# Patient Record
Sex: Male | Born: 1965 | Race: White | Hispanic: No | Smoking: Current every day smoker
Health system: Southern US, Community
[De-identification: ages and names within clinical notes are randomized; demographics above are authoritative.]

## PROBLEM LIST (undated history)

## (undated) DIAGNOSIS — E785 Hyperlipidemia, unspecified: Secondary | ICD-10-CM

## (undated) DIAGNOSIS — K219 Gastro-esophageal reflux disease without esophagitis: Secondary | ICD-10-CM

## (undated) DIAGNOSIS — I10 Essential (primary) hypertension: Secondary | ICD-10-CM

## (undated) DIAGNOSIS — I639 Cerebral infarction, unspecified: Secondary | ICD-10-CM

## (undated) HISTORY — PX: OTHER SURGICAL HISTORY: SHX169

## (undated) HISTORY — DX: Hyperlipidemia, unspecified: E78.5

## (undated) HISTORY — DX: Essential (primary) hypertension: I10

## (undated) HISTORY — PX: INGUINAL HERNIA REPAIR: SUR1180

## (undated) HISTORY — PX: CLAVICLE SURGERY: SHX598

## (undated) HISTORY — DX: Cerebral infarction, unspecified: I63.9

## (undated) HISTORY — DX: Gastro-esophageal reflux disease without esophagitis: K21.9

---

## 2005-11-07 ENCOUNTER — Emergency Department (HOSPITAL_COMMUNITY): Admission: EM | Admit: 2005-11-07 | Discharge: 2005-11-07 | Payer: Self-pay | Admitting: Emergency Medicine

## 2005-11-09 ENCOUNTER — Emergency Department (HOSPITAL_COMMUNITY): Admission: EM | Admit: 2005-11-09 | Discharge: 2005-11-09 | Payer: Self-pay | Admitting: Family Medicine

## 2006-05-08 DIAGNOSIS — K279 Peptic ulcer, site unspecified, unspecified as acute or chronic, without hemorrhage or perforation: Secondary | ICD-10-CM

## 2006-05-08 HISTORY — DX: Peptic ulcer, site unspecified, unspecified as acute or chronic, without hemorrhage or perforation: K27.9

## 2006-10-13 ENCOUNTER — Inpatient Hospital Stay (HOSPITAL_COMMUNITY): Admission: EM | Admit: 2006-10-13 | Discharge: 2006-10-22 | Payer: Self-pay | Admitting: Emergency Medicine

## 2007-09-13 ENCOUNTER — Emergency Department (HOSPITAL_COMMUNITY): Admission: EM | Admit: 2007-09-13 | Discharge: 2007-09-14 | Payer: Self-pay | Admitting: Emergency Medicine

## 2008-02-25 ENCOUNTER — Emergency Department (HOSPITAL_COMMUNITY): Admission: EM | Admit: 2008-02-25 | Discharge: 2008-02-25 | Payer: Self-pay | Admitting: Emergency Medicine

## 2010-09-20 NOTE — H&P (Signed)
Christopher [name], Christopher [name] NO.:  [id number]   MEDICAL RECORD NO.:  [id number]          PATIENT TYPE:  EMS   LOCATION:  MAJO                         FACILITY:  [facility]   PHYSICIAN:  Ardeth Sportsman, MD     DATE OF BIRTH:  08-22-1965   DATE OF ADMISSION:  10/13/2006  DATE OF DISCHARGE:                              HISTORY & PHYSICAL   PRIMARY CARE PHYSICIAN:  I do not have.   REQUESTING PHYSICIAN:  Benjiman Core.   SURGEON:  Karie Soda.   REASON FOR CONSULT:  Pneumoperitoneum with abdominal pain.   HISTORY OF PRESENT ILLNESS:  Christopher [name] is a 45 year old male who denies  really any significant abdominal complaints in the past, who woke up  this morning with severe abdominal pain.  He described it, especially,  in the right lower quadrant and epigastric regions.  It is very severe,  worse with movement and coughing.  He has had some nausea and anorexia,  but no definite emesis.  No sick contacts or travel history.  He  normally has a bowel movement about every day.  No known bouts of  diarrhea or constipation.  He has never had anything like this before.   Normally, he can eat anything he wants and seem to have maybe a little  heartburn with spicy foods, but certainly no postprandial nausea,  bloating or pain in the past.  He says he just takes a Tums and Rolaids  for his reflux, but it has not been severe.  He has never had any upper  or lower endoscopy.  No history of inflammatory bowel disease or Crohn,  irritable bowel or food allergies or if any family history of this sort.   PAST MEDICAL HISTORY:  1. Tobacco abuse.  2. Crack abuse.  3. Pneumonias in the past, last one 5 years ago.   PAST SURGICAL HISTORY:  He had an opened right inguinal hernia repair  when he was about 45-years-old.  No other abdominal or any other  surgeries.   ALLERGIES:  NONE.   MEDICATIONS:  None.   SOCIAL HISTORY:  He has got probably about a 30 pack year history of  tobacco,  currently smoking about a pack per day.  Right now he denies  that he drinks, but I have heard evidence from other sources that he  does.  He does smoke crack occasionally and he smoked within the last 48  hours.   REVIEW OF SYSTEMS:  As noted per HPI.  CONSTITUTIONAL:  No fevers,  chills or sweats, but he has had decreased appetite.  No change in his  weight.  He has always been rather skinny.  EYES/ENT/CARDIOVASCULAR/RESPIRATORY:  Negative.  He claims he can be  pretty psychically active and no exertional chest pain or shortness of  breath.  RESPIRATORY:  Actually, of note, he did have a little bit of a  cough of some thinly productive sputum, but it has not been purulent or  nasty and seems to be mild and resolving.  GU/TESTICULAR/MUSCULAR/NEUROLOGICAL/PSYCHIATRIC/HEPATIC/RENAL/ENDOCRINE:  Are all negative.  ALLERGIC:  Negative.   VITAL SIGNS:  Noted on the chart.  His temperature is 97.3.  Respirations 18.  Pulse 52, 97% saturations on room air.  Initially  10/10 pain, now down to 4/10 pain.  His blood pressure is 111/60.  IN GENERAL:  He is  well-developed, thin male, obvious uncomfortable,  lying perfectly still.  PSYCH:  He is a little bit groggy, but he is awake, alert and oriented  x4.  No evidence of dementia, psychosis or paranoia.  EYES:  Pupils equal, round and reactive to light.  Extraocular movements  intact.  His sclerae are nonicteric or injected.  HEENT:  He is normocephalic, atraumatic with no facial asymmetry.  Mucous membranes are dry.  His dentition is fair, but his nasopharynx  and oropharynx is clear.  NECK:  Supple with his trachea is midline.  Thyroid appears to be  normal.  HEART:  Regular rate and rhythm.  No murmurs, gallops or rubs.  CHEST:  Clear to auscultation bilaterally.  No wheezes, rales or  rhonchi.  No pain or rib sternal compression.  ABDOMEN:  Flat, but firm.  He is very tender in his epigastric region  and also somewhat in his right lower  quadrant with evidence of  peritoneal irritation by percussion, bed shaking & cough.  Concerning  for an acute abdomen.  GENITAL:  Normal external male genitalia.  Circumcised.  Testes  distended bilaterally.  No inguinal hernias.  RECTAL:  Deferred per patient request, but no external masses.  EXTREMITIES:  No clubbing or cyanosis.  Normal radial and dorsalis pedis  pulses.  MUSCULOSKELETAL:  Full range of motion shoulders, elbows, wrists, as  well as hips, knees and ankles.  NEUROLOGICAL:  Cranial nerves II-XII are intact.  Hand grip is 5/5 equal  and symmetrical.  No resting or tension tremors.  LYMPH:  No head, neck, axillary or groin lymphadenopathy.  SKIN:  No petechiae.  No purpura.  No other sores or lesions.   LABORATORY DATA:  His white count is 17 with a definite left shift,  hemoglobin of 14.4.  His potassium is 3.4.  His BUN and creatinine are  actually normal.  His UA does show some ketones, but no definite  urinalysis.  His 3-view of the abdomen shows about centimeter of rim of  free air over his right liver.  He has some stool in his colon, but no  definite evidence of bowel obstruction.  The lung looks pretty clear  with no definite pneumonia or heart failure.  EKG is pending.   ASSESSMENT/PLAN:  [age]-year-old male with sudden onset of abdominal  pain.   Given this patient's background and history of reflux, my suspicion is  he has probably got a perforated gastric or duodenal ulcer.  The other  possibility would be a perforated diverticulitis or appendicitis,  although that seems a little less likely.   Differential diagnoses explained, options are discussed and  recommendations were made for diagnostic laparoscopy with possible  exploratory laparotomy.  A possible bowel resection, ostomy and other  risks were discussed.   Risks such as stroke, heart attack, deep venous thrombosis, pulmonary and death discussed.  Risks such as bleeding, need for  transfusion,  wound infection, abscess, injury of organs, incisional hernia  dehiscence, need for re-operation and other risks were discussed.  Options are discussed and he agrees to proceed.   Given his tobacco abuse, we will make sure he is already getting some IV  antibiotics.  We will give him a little aggressive IV fluids.  We will  do this.  He has been posted and we will do this emergently.  He agrees  to proceed.  Discussion was made with Dr. Rubin Payor, as well as ER  nursing staff.      Ardeth Sportsman, MD  Electronically Signed     SCG/MEDQ  D:  10/13/2006  T:  10/13/2006  Job:  [id number]

## 2010-09-20 NOTE — Op Note (Signed)
NAMELAUREN, Christopher [name] NO.:  [id number]   MEDICAL RECORD NO.:  [id number]          PATIENT TYPE:  INP   LOCATION:  2550                         FACILITY:  [facility]   PHYSICIAN:  Ardeth Sportsman, MD     DATE OF BIRTH:  Dec 13, 1965   DATE OF PROCEDURE:  10/13/2006  DATE OF DISCHARGE:                               OPERATIVE REPORT   SURGEON:  Ardeth Sportsman, MD   ASSISTANT SURGEON:  Lorne Skeens. Hoxworth, M.D.   PREOPERATIVE DIAGNOSIS:  Pneumoperitoneum, probable perforated viscous.   POSTOPERATIVE DIAGNOSIS:  Perforated duodenal ulcer with contamination.   PROCEDURES PERFORMED:  1. Diagnostic laparoscopy.  2. Laparoscopic omental patch Cheree Ditto patch).   SPECIMENS:  None.   DRAINS:  None.   ESTIMATED BLOOD LOSS:  Less than 5 mL.   COMPLICATIONS:  None apparent.   ANESTHESIA:  1. General.  2. Local anesthesia.  3. Field block around the port site.   INDICATIONS:  [name] is a 45 year old male who had sudden severe  abdominal pain, epigastric and right-sided.  He has a history of  heartburn and reflux.  He had evidence of pneumoperitoneum based on  radiologic findings and concerning for acute abdomen.   The differential diagnoses include perforated stomach or duodenal ulcer,  appendicitis, diverticulitis, bowel obstruction, ischemia, and other  differential diagnoses is explained.   The options were discussed.  The recommendations, need for diagnostic  laparoscopy with possible laparotomy, and repair of perforation,  possible bowel resection, possible ostomy.   The risks such as stroke, heart attack, deep venous thrombosis,  pulmonary embolism and death discussed.  The risks such as bleeding,  need for transfusion, wound infection, abscess, injury to other organs,  incisional drainage and prolonged pain, other diagnoses and other risks  were discussed after history of perforation with extreme risk of  morbidity and mortality were discussed as well.   The options were  discussed and he agreed to proceed.   OPERATIVE FINDINGS:  He had moderate contamination in his right upper  quadrant and pelvis with an obvious punched out duodenal ulcer about 8-  mm in size on the anterior wall.  It was only partially covered.  It did  not seem to have any major ulcerations.  There was no exsanguination.  His appendix, small bowel, colon, gallbladder, liver, stomach and  omentum appeared to be normal.   DESCRIPTION OF PROCEDURE:  An informed consent was confirmed.  The  patient was given IV antibiotics.  He underwent general anesthesia  without difficulty.  He had sequential compression devices active during  the entire case.  He had a Foley catheter sterilely placed.  He was  positioned supine, both arms tucked.  His abdomen was prepped and draped  in sterile fashion.   Entry was gained in the abdomen with the patient in steep reverse  Trendelenburg and right side up, using a stab incision and a 5-mm, 0-  degree scope with optimal entry.  The capnoperitoneum to 15 mmHg to  provide good abdominal insufflation.  After direct visualization, 5-mm  ports were placed infraumbilical into the left  upper quadrant and the  right flank.  A camera inspection revealed moderate contamination of the  right upper quadrant, down the pelvis and right paracolic gutter with  significant contamination in the right upper quadrant.  The stomach  appeared to be normal.  It was followed out and around the pylorus and  just beyond that, near the junction between the first and second part of  the duodenum was an obvious punched-out hole ulceration about 8-mm in  size.  There was a fair amount of coagulum.  It seemed obvious that the  falciform ligament, gallbladder, and omentum were tried to wall off the  hole but were not successful in doing this.   Copious irrigation was done.  The omentum could easily be brought up.  The hole was plugged shut using 3-0 silk interrupted  stitches times 3,  used to help tie the omentum down to plug the hole up in a classic  Graham omental patch fashion.  This provided good approximation.   Copious irrigation over 7 liters was done with ultimately a nice clear  return in all quadrants, especially in the pelvis area upper quadrant.  Camera inspection again revealed the rest of the organs appeared to be  normal.  The bowel and bladder seemed to have no major inflammation,  necrosis or gangrene.  The upper three abdominal ports were removed with  no evidence of bleeding from the skin or peritoneum.  The  capnoperitoneum was evacuated and the umbilical port was removed.  They  were all 5-mm ports.  The fascial defects were too small to require  aggressive fascial closure.  The skin was closed using a 4-0 Monocryl  stitch.  A sterile dressing was applied.   The patient was extubated and sent to the recovery room in stable  condition.  There is no family available to discuss the findings, but we  will try to talk to them as soon as we can contact them.      Ardeth Sportsman, MD  Electronically Signed     SCG/MEDQ  D:  10/13/2006  T:  10/13/2006  Job:  [id number]

## 2010-09-23 NOTE — Discharge Summary (Signed)
NAMEROYDEN, BULMAN NO.:  [id number]   MEDICAL RECORD NO.:  [id number]          PATIENT TYPE:  INP   LOCATION:  5021                         FACILITY:  [facility]   PHYSICIAN:  Adolph Pollack, M.D.DATE OF BIRTH:  08-30-1965   DATE OF ADMISSION:  10/13/2006  DATE OF DISCHARGE:  10/22/2006                               DISCHARGE SUMMARY   CHIEF COMPLAINT/REASON FOR ADMISSION:  Mr. Kepner is a 41-year male  patient who denies any problems with abdominal pain in the past.  He  woke up with severe abdominal pain on the morning of admission, right  lower quadrant epigastric regions, worse with movement and coughing.  This has been associated with nausea and anorexia.  He presented to the  ER where he was found to be afebrile, vital signs were stable.  His  abdominal exam revealed a flat, but firm abdomen, very tender in  epigastric region and the right lower quadrant with evidence of  peritoneal irritation by percussion, bed shaking and cough concerning  for an acute abdomen, this was on Dr. Gordy Savers evaluation.  His WBC's  were 17,000 with a left shift, potassium 3.4.  Urinalysis was not  consistent with a UTI.  His abdominal films showed about a centimeter  rim of free air over his right liver, but no evidence of bowel  obstruction.  He was admitted with a diagnoses of pneumoperitoneum,  possible perforated gastric or duodenal ulcer, although perforated  diverticulitis or appendicitis could be a possibility. The patient was  admitted by Dr. Michaell Cowing and taken urgently to the operating room.   HOSPITAL COURSE:  As noted, the patient was admitted with the  pneumoperitoneum and was taken directly to the operating room by Dr.  Michaell Cowing where he was found to have a perforated duodenal ulcer.  He  underwent a diagnostic laparoscopy with laparoscopic Cheree Ditto patch  application and was sent back to the floor to recover.   Postop day one, the patient was having fevers of 101.  He  was on empiric  Mefoxin and Flagyl. He had good urine output.  His white count was  17,900, hemoglobin improved with hydration at 13.8.  He was continued on  antibiotics, bowel rest with NG tube decompression and IV Protonix.  Over the next several days, the patient slowly improved, but his white  count was increased slightly to 18,400.  He continued to have some  epigastric pain and incisional pain, but was passing flatus.  His  abdomen was soft. Plans were to initiate PICC line and TNA after  pharmacy dosed.  There was some concern that the patient may have an  early abscess or duodenal leak.  He was continued on antibiotics and  monitored very closely.  By postop day three, he was still spiking  fevers up to 101.4, was requiring nasal cannula O2.  His chest x-ray  showed a new right lower lobe infiltrate. His prealbumin was low at 7.9,  but his white count was improving to 12,400.  Neutrophils were 90%.  Creatinine was stable and potassium was stable.  The patient had pulled  out his NG during the night, but this was reinserted. He had bowel  sounds, otherwise on exam and minimal NG output. Nebulizer treatments  were added and incentive  spirometry was pushed and the patient was  mobilized. His Foley was discontinued and a two-view chest x-ray was  ordered.  The patient had very diminished lung sounds in the right base.   On postop day four, chest x-rays were reviewed and the patient was found  to have a very large layering pleural effusion on the right,  interventional radiology was consulted and the patient subsequently  underwent an ultrasound-guided thoracentesis postop day four by  interventional radiology where a 800 cc of cloudy yellow fluid was  obtained.   By postop day five, the patient had marked improvement in his clinical  signs, in regards to respiratory status as well as he was feeling much  better overall and less tired. His white count had decreased to 6500,   hemoglobin was 12.1. At this point, he was tolerating clear liquids and  he was advanced to full liquid diet.  His PCA was discontinued in favor  of Percocet for better pain management.  His TNA was weaned.  He was  continued on Mefoxin and Flagyl and he was continued on aggressive  pulmonary toilet training.  Over the next several days, the patient  continued to improve dramatically.  He was advanced to a regular diet,  TNA was discontinued and he was kept in the hospital to monitor his  pulmonary status for the next several days.  A repeat chest x-ray showed  a persistent right pleural effusion on postoperative day number seven,  so he underwent another ultrasound guided thoracentesis on October 21, 2006, by interventional radiology and this time 200 cc of blood tinged  cloudy fluid were obtained. By postop day nine, the patient reported  feeling much better.  His vital signs were stable.  He was satting at  98% on room air.  His lungs sounds were clear.  His abdomen was  unremarkable.  His pleural fluid gram stain showed no organisms on  either culture and otherwise he was deemed appropriate for discharge  home.  Smoking cessation was encouraged by the discharging physician,  Dr. Abbey Chatters.   DISCHARGE DIAGNOSES:  1. Perforated duodenal ulcer, status post laparoscopic repair and      Cheree Ditto patch.  2. Right pleural effusion status post ultrasound-guided thoracentesis      on October 17, 2006, with 800 cc of cloudy yellow fluid obtained with      subsequent recurrence of pleural effusion and repeat thoracentesis      on October 21, 2006, with 200 cc blood tinged cloudy fluid obtained.  3. Continued tobacco abuse.  Smoking cessation encouraged.  4. History of polysubstance abuse including crack cocaine.  5. Hypertension controlled.   DISCHARGE MEDICATIONS:  1. Protonix b.i.d.  2. Tylenol for pain.  3. No Advil, ibuprofen or aspirin products.   DIET:  Low-fat.   ACTIVITY:  May walk up  steps.  May shower.  No lifting for two weeks.   FOLLOW-UP APPOINTMENTS:  He is to call Dr. Michaell Cowing to be seen in the  office in two weeks.  He has also been instructed to call the  physician's office if he develops fever greater than 101.5, persistent  vomiting or wound problems.      Allison L. Rennis Harding, N.P.      Adolph Pollack, M.D.  Electronically Signed  ALE/MEDQ  D:  11/29/2006  T:  11/30/2006  Job:  [id number]   cc:   Ardeth Sportsman, MD

## 2010-09-27 ENCOUNTER — Observation Stay (HOSPITAL_COMMUNITY)
Admission: EM | Admit: 2010-09-27 | Discharge: 2010-09-27 | Disposition: A | Discharge status: Home / Self Care | Payer: Self-pay | Attending: Emergency Medicine | Admitting: Emergency Medicine

## 2010-09-27 DIAGNOSIS — IMO0002 Reserved for concepts with insufficient information to code with codable children: Principal | ICD-10-CM | POA: Insufficient documentation

## 2010-09-27 LAB — POCT I-STAT, CHEM 8
BUN: 7 mg/dL (ref 6–23)
Calcium, Ion: 1.12 mmol/L (ref 1.12–1.32)
Chloride: 101 mEq/L (ref 96–112)
Creatinine, Ser: 1 mg/dL (ref 0.4–1.5)
Glucose, Bld: 70 mg/dL (ref 70–99)
Potassium: 3.2 mEq/L — ABNORMAL LOW (ref 3.5–5.1)

## 2010-09-27 LAB — CBC
HCT: 40.4 % (ref 39.0–52.0)
Hemoglobin: 13.8 g/dL (ref 13.0–17.0)
MCV: 89.4 fL (ref 78.0–100.0)
RBC: 4.52 MIL/uL (ref 4.22–5.81)
RDW: 12.3 % (ref 11.5–15.5)
WBC: 13.9 10*3/uL — ABNORMAL HIGH (ref 4.0–10.5)

## 2010-09-27 LAB — DIFFERENTIAL
Eosinophils Relative: 1 % (ref 0–5)
Lymphocytes Relative: 11 % — ABNORMAL LOW (ref 12–46)
Lymphs Abs: 1.6 10*3/uL (ref 0.7–4.0)
Neutro Abs: 10.9 10*3/uL — ABNORMAL HIGH (ref 1.7–7.7)

## 2010-09-28 ENCOUNTER — Emergency Department (HOSPITAL_COMMUNITY)
Admission: EM | Admit: 2010-09-28 | Discharge: 2010-09-28 | Disposition: A | Discharge status: Home / Self Care | Payer: Self-pay | Attending: Emergency Medicine | Admitting: Emergency Medicine

## 2010-09-29 ENCOUNTER — Emergency Department (HOSPITAL_COMMUNITY)
Admission: EM | Admit: 2010-09-29 | Discharge: 2010-09-29 | Disposition: A | Discharge status: Home / Self Care | Payer: Self-pay | Attending: Emergency Medicine | Admitting: Emergency Medicine

## 2010-09-29 DIAGNOSIS — Z09 Encounter for follow-up examination after completed treatment for conditions other than malignant neoplasm: Secondary | ICD-10-CM | POA: Insufficient documentation

## 2010-09-29 DIAGNOSIS — Y849 Medical procedure, unspecified as the cause of abnormal reaction of the patient, or of later complication, without mention of misadventure at the time of the procedure: Secondary | ICD-10-CM | POA: Insufficient documentation

## 2010-09-29 DIAGNOSIS — T8140XA Infection following a procedure, unspecified, initial encounter: Secondary | ICD-10-CM | POA: Insufficient documentation

## 2011-02-06 LAB — DIFFERENTIAL
Basophils Absolute: 0.1
Eosinophils Absolute: 0.2
Eosinophils Relative: 2
Monocytes Absolute: 0.8

## 2011-02-06 LAB — POCT I-STAT, CHEM 8
BUN: 14
Calcium, Ion: 1.26
Hemoglobin: 14.3
Sodium: 140
TCO2: 33

## 2011-02-06 LAB — CBC
HCT: 40.1
Hemoglobin: 13.5
MCHC: 33.7
MCV: 90.7
Platelets: 275
RDW: 11.8

## 2011-02-22 LAB — BODY FLUID CULTURE

## 2011-02-23 LAB — BASIC METABOLIC PANEL
BUN: 10
BUN: 11
BUN: 9
CO2: 25
CO2: 27
CO2: 28
CO2: 30
Calcium: 8.4
Calcium: 8.9
Chloride: 97
Creatinine, Ser: 0.64
Creatinine, Ser: 0.71
Creatinine, Ser: 0.97
GFR calc Af Amer: >60
GFR calc non Af Amer: >60
GFR calc non Af Amer: >60
Glucose, Bld: 107 — ABNORMAL HIGH
Glucose, Bld: 122 — ABNORMAL HIGH
Glucose, Bld: 76
Glucose, Bld: 90
Potassium: 4
Potassium: 4
Potassium: 4.2
Sodium: 135
Sodium: 137
Sodium: 138

## 2011-02-23 LAB — MAGNESIUM
Magnesium: 1.9
Magnesium: 2.1

## 2011-02-23 LAB — DIFFERENTIAL
Basophils Absolute: 0
Eosinophils Absolute: 0
Eosinophils Absolute: 0
Eosinophils Relative: 0
Eosinophils Relative: 0
Eosinophils Relative: 0
Lymphocytes Relative: 4 — ABNORMAL LOW
Lymphocytes Relative: 4 — ABNORMAL LOW
Lymphocytes Relative: 6 — ABNORMAL LOW
Lymphs Abs: 0.5 — ABNORMAL LOW
Lymphs Abs: 0.7
Monocytes Absolute: 1 — ABNORMAL HIGH
Monocytes Absolute: 1.3 — ABNORMAL HIGH
Monocytes Relative: 6
Monocytes Relative: 6
Neutrophils Relative %: 90 — ABNORMAL HIGH

## 2011-02-23 LAB — URINALYSIS, ROUTINE W REFLEX MICROSCOPIC
Glucose, UA: NEGATIVE
Ketones, ur: 15 — AB
Protein, ur: NEGATIVE
pH: 5.5

## 2011-02-23 LAB — CBC
HCT: 35.9 — ABNORMAL LOW
HCT: 40.5
HCT: 41.8
HCT: 42.5
Hemoglobin: 11.8 — ABNORMAL LOW
Hemoglobin: 12.1 — ABNORMAL LOW
Hemoglobin: 13.4
Hemoglobin: 13.8
Hemoglobin: 14.1
Hemoglobin: 14.4
MCHC: 33.1
MCHC: 33.4
MCHC: 33.5
MCHC: 34.1
MCV: 87.7
MCV: 88.2
MCV: 88.6
Platelets: 254
Platelets: 265
Platelets: 333
Platelets: 454 — ABNORMAL HIGH
RBC: 3.93 — ABNORMAL LOW
RBC: 4.07 — ABNORMAL LOW
RBC: 4.66
RBC: 4.71
RDW: 12.5
RDW: 12.6
RDW: 12.7
RDW: 13
RDW: 13
RDW: 13
WBC: 17.3 — ABNORMAL HIGH
WBC: 18.4 — ABNORMAL HIGH
WBC: 6.5
WBC: 8.9

## 2011-02-23 LAB — COMPREHENSIVE METABOLIC PANEL
ALT: 11
AST: 14
Albumin: 2.4 — ABNORMAL LOW
Alkaline Phosphatase: 70
BUN: 8
CO2: 27
Calcium: 8.3 — ABNORMAL LOW
Chloride: 102
Creatinine, Ser: 0.54
Creatinine, Ser: 0.68
GFR calc Af Amer: >60
GFR calc non Af Amer: >60
Glucose, Bld: 98
Potassium: 3.6
Sodium: 134 — ABNORMAL LOW
Total Bilirubin: 0.5
Total Protein: 5.6 — ABNORMAL LOW

## 2011-02-23 LAB — I-STAT 8, (EC8 V) (CONVERTED LAB)
BUN: 14
Bicarbonate: 23
Chloride: 105
Glucose, Bld: 158 — ABNORMAL HIGH
HCT: 46
Hemoglobin: 15.6
Operator id: 196461
Potassium: 3.4 — ABNORMAL LOW
Sodium: 139

## 2011-02-23 LAB — PHOSPHORUS
Phosphorus: 2.2 — ABNORMAL LOW
Phosphorus: 4.2

## 2011-02-23 LAB — SAMPLE TO BLOOD BANK

## 2011-02-23 LAB — PREALBUMIN
Prealbumin: 7 — ABNORMAL LOW
Prealbumin: 7.9 — ABNORMAL LOW

## 2011-02-23 LAB — TRIGLYCERIDES
Triglycerides: 71
Triglycerides: 97

## 2012-11-11 ENCOUNTER — Encounter (HOSPITAL_COMMUNITY): Payer: Self-pay | Admitting: Emergency Medicine

## 2012-11-11 ENCOUNTER — Emergency Department (INDEPENDENT_AMBULATORY_CARE_PROVIDER_SITE_OTHER)
Admission: EM | Admit: 2012-11-11 | Discharge: 2012-11-11 | Disposition: A | Discharge status: Home / Self Care | Payer: Self-pay | Source: Home / Self Care

## 2012-11-11 DIAGNOSIS — L02414 Cutaneous abscess of left upper limb: Secondary | ICD-10-CM

## 2012-11-11 DIAGNOSIS — IMO0002 Reserved for concepts with insufficient information to code with codable children: Secondary | ICD-10-CM

## 2012-11-11 MED ORDER — SULFAMETHOXAZOLE-TRIMETHOPRIM 800-160 MG PO TABS: 1.0000 | ORAL_TABLET | Freq: Two times a day (BID) | ORAL | Status: DC

## 2012-11-11 MED ORDER — HYDROCODONE-ACETAMINOPHEN 5-325 MG PO TABS: 1.0000 | ORAL_TABLET | ORAL | Status: DC | PRN

## 2012-11-11 NOTE — ED Provider Notes (Signed)
History    CSN: [id number] Arrival date & time 11/11/12  1133  First MD Initiated Contact with Patient 11/11/12 1312     Chief Complaint  Patient presents with  . Abscess   (Consider location/radiation/quality/duration/timing/severity/associated sxs/prior Treatment) HPI Comments: 47 year old male presents with 2 separate abscesses to the left forearm extensor surface. 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  History reviewed. No pertinent past medical history. History reviewed. No pertinent past surgical history. History reviewed. No pertinent family history. History  Substance Use Topics  . Smoking status: Current Every Day Smoker -- 0.50 packs/day    Types: Cigarettes  . Smokeless tobacco: Not on file  . Alcohol Use: Yes    Review of Systems  Constitutional: Negative.   Skin:       2 abscesses in the left arm as above  Hematological: Does not bruise/bleed easily.  All other systems reviewed and are negative.    Allergies  Review of patient's allergies indicates no known allergies.  Home Medications   Current Outpatient Rx  Name  Route  Sig  Dispense  Refill  . HYDROcodone-acetaminophen (NORCO/VICODIN) 5-325 MG per tablet   Oral   Take 1 tablet by mouth every 4 (four) hours as needed for pain.   15 tablet   0   . sulfamethoxazole-trimethoprim (SEPTRA DS) 800-160 MG per tablet   Oral   Take 1 tablet by mouth every 12 (twelve) hours.   14 tablet   0    BP 110/77  Pulse 84  Temp(Src) 98.3 F (36.8 C) (Oral)  Resp 16  SpO2 99% Physical Exam  Nursing note and vitals reviewed. Constitutional: He is oriented to person, place, and time. He appears well-developed and well-nourished. No distress.  Neck: Normal range of motion. Neck supple.  Pulmonary/Chest: Effort normal.  Musculoskeletal: He exhibits no edema.  Neurological: He is alert and oriented to person, place, and time. He exhibits normal muscle tone.  Skin: Skin is warm  and dry. There is erythema.  Left forearm extensor surface proximal lesion approximately 8 cm diameter of erythema and induration. There is small amount of purulent drainage.  Distal to that lesion is a much smaller lesion approximately 2 and half centimeters in diameter with induration extending approximately an additional 2 cm. Dilation to has a small amount of drainage.  Psychiatric: He has a normal mood and affect.    ED Course  INCISION AND DRAINAGE Date/Time: 11/11/2012 1:50 PM Performed by: Phineas Real, Viveka Wilmeth Authorized by: Leslee Home C Consent: Verbal consent obtained. Risks and benefits: risks, benefits and alternatives were discussed Consent given by: patient Patient understanding: patient states understanding of the procedure being performed Patient identity confirmed: verbally with patient Type: abscess Body area: upper extremity Location details: left arm Anesthesia: local infiltration Local anesthetic: lidocaine 2% with epinephrine Anesthetic total: 7 ml Patient sedated: no Scalpel size: 11 Incision type: single with marsupialization Complexity: complex Drainage: purulent Drainage amount: copious Wound treatment: drain placed Packing material: 1/4 in gauze   (including critical care time) the left upper extremity distal lesion is approximately 2 and half centimeters in diameter with another 2 cm of induration surrounding it. There is scant amount of purulent exudate. The area was anesthetized with 1 cc of 2% Xylocaine with epinephrine. A single simple puncture wounds created with an 11 blade. A small amount of purulent material accompanied by a small amount of blood was expressed. Proximally 1/2-1 inch of 1/4 inch Nu Gauze was  placed at the opening. Both of the abscesses were covered with sterile gauze and wrapped. Labs Reviewed - No data to display No results found. 1. Abscess of arm, left     MDM  He has as since the surface of the forearm close to the elbow was I&D  packed and dressed. The smaller distal abscess was punctured without marsupialization drained and approximately 1 inch packing was placed. Dressings were then applied. Patient is instructed to keep the dressing clean and dry. Return in 2 days for wound check and packing removal. Septra DS one twice a day for 7 days Norco 5 mg one every 4 hours when necessary pain Recheck promptly for worsening or for any new symptoms problems or worsening.   Hayden Rasmussen, NP 11/11/12 1415  Hayden Rasmussen, NP 11/11/12 1431

## 2012-11-11 NOTE — ED Provider Notes (Signed)
Medical screening examination/treatment/procedure(s) were performed by non-physician practitioner and as supervising physician I was immediately available for consultation/collaboration.  Leslee Home, M.D.  Reuben Likes, MD 11/11/12 1540

## 2012-11-11 NOTE — ED Notes (Signed)
C/o 2 abscesses on left arm.  Onset about 3 days ago.  Pt states its hot to touch, redness and swelling.  Pt has used peroxide to clean it.  Pt took 2 antibiotic pills a friend gave him yesterday, he could not tell me what kind.  Leilani Able CMA student

## 2012-11-13 ENCOUNTER — Emergency Department (INDEPENDENT_AMBULATORY_CARE_PROVIDER_SITE_OTHER)
Admission: EM | Admit: 2012-11-13 | Discharge: 2012-11-13 | Disposition: A | Discharge status: Nursing Home (Includes ICF) | Payer: Self-pay | Source: Home / Self Care | Attending: Emergency Medicine | Admitting: Emergency Medicine

## 2012-11-13 ENCOUNTER — Encounter (HOSPITAL_COMMUNITY): Payer: Self-pay | Admitting: Emergency Medicine

## 2012-11-13 ENCOUNTER — Inpatient Hospital Stay (HOSPITAL_COMMUNITY)
Admission: EM | Admit: 2012-11-13 | Discharge: 2012-11-18 | DRG: 603 | Disposition: A | Discharge status: Home / Self Care | Payer: MEDICAID | Attending: Family Medicine | Admitting: Family Medicine

## 2012-11-13 ENCOUNTER — Encounter (HOSPITAL_COMMUNITY): Payer: Self-pay

## 2012-11-13 DIAGNOSIS — A4902 Methicillin resistant Staphylococcus aureus infection, unspecified site: Secondary | ICD-10-CM | POA: Diagnosis present

## 2012-11-13 DIAGNOSIS — IMO0002 Reserved for concepts with insufficient information to code with codable children: Principal | ICD-10-CM | POA: Diagnosis present

## 2012-11-13 DIAGNOSIS — F17200 Nicotine dependence, unspecified, uncomplicated: Secondary | ICD-10-CM | POA: Diagnosis present

## 2012-11-13 DIAGNOSIS — F1410 Cocaine abuse, uncomplicated: Secondary | ICD-10-CM | POA: Diagnosis present

## 2012-11-13 DIAGNOSIS — Z4800 Encounter for change or removal of nonsurgical wound dressing: Secondary | ICD-10-CM

## 2012-11-13 DIAGNOSIS — Z720 Tobacco use: Secondary | ICD-10-CM | POA: Diagnosis present

## 2012-11-13 DIAGNOSIS — L0291 Cutaneous abscess, unspecified: Secondary | ICD-10-CM

## 2012-11-13 DIAGNOSIS — Z22322 Carrier or suspected carrier of Methicillin resistant Staphylococcus aureus: Secondary | ICD-10-CM

## 2012-11-13 LAB — BASIC METABOLIC PANEL
BUN: 10 mg/dL (ref 6–23)
CO2: 30 mEq/L (ref 19–32)
Chloride: 99 mEq/L (ref 96–112)
Creatinine, Ser: 0.92 mg/dL (ref 0.50–1.35)
Glucose, Bld: 112 mg/dL — ABNORMAL HIGH (ref 70–99)

## 2012-11-13 LAB — CBC WITH DIFFERENTIAL/PLATELET
HCT: 38.6 % — ABNORMAL LOW (ref 39.0–52.0)
Hemoglobin: 13.5 g/dL (ref 13.0–17.0)
Lymphocytes Relative: 18 % (ref 12–46)
Lymphs Abs: 1.6 10*3/uL (ref 0.7–4.0)
Monocytes Absolute: 1.1 10*3/uL — ABNORMAL HIGH (ref 0.1–1.0)
Monocytes Relative: 12 % (ref 3–12)
Neutro Abs: 6.2 10*3/uL (ref 1.7–7.7)
WBC: 9.2 10*3/uL (ref 4.0–10.5)

## 2012-11-13 MED ORDER — DOCUSATE SODIUM 100 MG PO CAPS
100.0000 mg | ORAL_CAPSULE | Freq: Two times a day (BID) | ORAL | Status: DC
  Administered 2012-11-14 – 2012-11-18 (×10): 100 mg via ORAL
  Filled 2012-11-13 (×13): qty 1

## 2012-11-13 MED ORDER — ONDANSETRON HCL 4 MG/2ML IJ SOLN: 4.0000 mg | Freq: Four times a day (QID) | INTRAMUSCULAR | Status: DC | PRN

## 2012-11-13 MED ORDER — CLINDAMYCIN PHOSPHATE 600 MG/50ML IV SOLN
600.0000 mg | Freq: Four times a day (QID) | INTRAVENOUS | Status: DC
  Administered 2012-11-13 – 2012-11-17 (×15): 600 mg via INTRAVENOUS
  Filled 2012-11-13 (×18): qty 50

## 2012-11-13 MED ORDER — TETANUS-DIPHTHERIA TOXOIDS TD 5-2 LFU IM INJ
0.5000 mL | INJECTION | Freq: Once | INTRAMUSCULAR | Status: AC
  Administered 2012-11-14: 0.5 mL via INTRAMUSCULAR
  Filled 2012-11-13: qty 0.5

## 2012-11-13 MED ORDER — HYDROCODONE-ACETAMINOPHEN 5-325 MG PO TABS
1.0000 | ORAL_TABLET | ORAL | Status: DC | PRN
  Administered 2012-11-17 (×2): 1 via ORAL
  Filled 2012-11-13 (×2): qty 1

## 2012-11-13 MED ORDER — SODIUM CHLORIDE 0.9 % IJ SOLN
3.0000 mL | Freq: Two times a day (BID) | INTRAMUSCULAR | Status: DC
  Administered 2012-11-13 – 2012-11-14 (×2): 3 mL via INTRAVENOUS

## 2012-11-13 MED ORDER — SODIUM CHLORIDE 0.9 % IV SOLN: 250.0000 mL | INTRAVENOUS | Status: DC | PRN

## 2012-11-13 MED ORDER — SODIUM CHLORIDE 0.9 % IJ SOLN: 3.0000 mL | INTRAMUSCULAR | Status: DC | PRN

## 2012-11-13 MED ORDER — CLINDAMYCIN PHOSPHATE 600 MG/50ML IV SOLN
600.0000 mg | Freq: Once | INTRAVENOUS | Status: AC
  Administered 2012-11-13: 600 mg via INTRAVENOUS
  Filled 2012-11-13: qty 50

## 2012-11-13 MED ORDER — ONDANSETRON HCL 4 MG PO TABS: 4.0000 mg | ORAL_TABLET | Freq: Four times a day (QID) | ORAL | Status: DC | PRN

## 2012-11-13 NOTE — ED Provider Notes (Signed)
   History    CSN: [id number] Arrival date & time 11/13/12  1401  First MD Initiated Contact with Patient 11/13/12 1501     Chief Complaint  Patient presents with  . Follow-up  . Abscess   (Consider location/radiation/quality/duration/timing/severity/associated sxs/prior Treatment) Patient is a 47 y.o. male presenting with abscess. The history is provided by the patient. No language interpreter was used.  Abscess Location:  Shoulder/arm Shoulder/arm abscess location:  L arm and R arm Abscess quality: draining, painful and warmth   Red streaking: yes   Progression:  Worsening Pain details:    Severity:  Moderate   Timing:  Constant   Progression:  Worsening Pt reports he is here for a recheck of abscess to arm.  Pt reports area of redness is larger on left arm.  Pt now has an abscessed area on right arm and a streak going up arm History reviewed. No pertinent past medical history. History reviewed. No pertinent past surgical history. History reviewed. No pertinent family history. History  Substance Use Topics  . Smoking status: Current Every Day Smoker -- 0.50 packs/day    Types: Cigarettes  . Smokeless tobacco: Not on file  . Alcohol Use: Yes    Review of Systems  All other systems reviewed and are negative.    Allergies  Review of patient's allergies indicates no known allergies.  Home Medications   Current Outpatient Rx  Name  Route  Sig  Dispense  Refill  . HYDROcodone-acetaminophen (NORCO/VICODIN) 5-325 MG per tablet   Oral   Take 1 tablet by mouth every 4 (four) hours as needed for pain.   15 tablet   0   . sulfamethoxazole-trimethoprim (SEPTRA DS) 800-160 MG per tablet   Oral   Take 1 tablet by mouth every 12 (twelve) hours.   14 tablet   0    BP 132/79  Pulse 67  Temp(Src) 97.9 F (36.6 C) (Oral)  Resp 16  SpO2 99% Physical Exam  Nursing note and vitals reviewed. Constitutional: He is oriented to person, place, and time. He appears  well-developed and well-nourished.  Musculoskeletal: He exhibits tenderness.  Open draining abscess x2 left arm,  Cellulitis around site,  Left arm.   Swollen area right arm with streaking up arm.    Neurological: He is oriented to person, place, and time. He has normal reflexes.  Skin: There is erythema.  Psychiatric: He has a normal mood and affect.    ED Course  Procedures (including critical care time) Labs Reviewed - No data to display No results found. 1. Cellulitis and abscess     MDM  Pt is on bactrim,  Given pt is failing oral antibiotic and area is increasing with new area and streaking on opposite I will send to ED for IV antibiotics, possible admission   Elson Areas, PA-C 11/13/12 1839

## 2012-11-13 NOTE — ED Notes (Signed)
Pt here today for follow up on abscess on Lt arm.  Pt states he has one starting on his lower RT arm, it is red swollen and a streak is going up his arm.  Pt is well alert and oriented  Leilani Able CMA Student

## 2012-11-13 NOTE — ED Notes (Signed)
Patient presents from with c/o worsening abscess to left forearm x 2 and a new abscess to right arm. Was seen on 7/7. Had I&D with packing and placed on Septra QD x 7 days. Followed up at urgent care today and was sent here for possible IV abx for increased redness around sites as well as a new area on right arm with streaking. No fevers, sweats or chills.

## 2012-11-13 NOTE — ED Provider Notes (Signed)
History    CSN: [id number] Arrival date & time 11/13/12  1711  None    Chief Complaint  Patient presents with  . Abscess   (Consider location/radiation/quality/duration/timing/severity/associated sxs/prior Treatment) HPI ROWYN SPILDE 47 y.o. presents with the abscess on his left forearm and cellulitis on his right forearm. He was seen in urgent care clinic and he was instructed to come the emergency room for worsening cellulitis. Patient has 2 abscesses on his left volar forearm which were incised and drained to 3 days ago at an urgent care clinic and packed. He was also started Bactrim at that time. He was seen again in the clinic today where the packing was removed and a larger abscess on his forearm and the smaller abscess. He was noted to have worsening cellulitis on the right forearm. Saline is is worsening. It slightly painful. No known exacerbating or relieving factors. Denies fevers chills sweats. Has been compliant with his and about. History reviewed. No pertinent past medical history. History reviewed. No pertinent past surgical history. No family history on file. History  Substance Use Topics  . Smoking status: Current Every Day Smoker -- 0.50 packs/day    Types: Cigarettes  . Smokeless tobacco: Not on file  . Alcohol Use: Yes     Comment: 2-3 "sparks" a day    Review of Systems  Constitutional: Negative for fever, chills, diaphoresis, activity change, appetite change and fatigue.  HENT: Negative for congestion, sore throat, rhinorrhea, sneezing and trouble swallowing.   Eyes: Negative for pain and redness.  Respiratory: Negative for cough, choking, chest tightness, shortness of breath, wheezing and stridor.   Cardiovascular: Negative for chest pain and leg swelling.  Gastrointestinal: Negative for nausea, vomiting, diarrhea, constipation, blood in stool, abdominal distention and anal bleeding.  Musculoskeletal: Negative for myalgias and back pain.  Skin: Positive  for rash (Right volar forearm) and wound (left proximal forearm).  Neurological: Negative for dizziness, speech difficulty, weakness, light-headedness, numbness and headaches.  Hematological: Negative for adenopathy.  Psychiatric/Behavioral: Negative for confusion.    Allergies  Review of patient's allergies indicates no known allergies.  Home Medications   No current outpatient prescriptions on file. BP 124/68  Pulse 63  Temp(Src) 98.4 F (36.9 C) (Oral)  Resp 18  Ht 5\' 9"  (1.753 m)  Wt 128 lb 9.6 oz (58.333 kg)  BMI 18.98 kg/m2  SpO2 98% Physical Exam  Nursing note and vitals reviewed. Constitutional: He is oriented to person, place, and time. He appears well-developed and well-nourished. No distress.  HENT:  Head: Normocephalic and atraumatic.  Eyes: Conjunctivae and EOM are normal. Right eye exhibits no discharge. Left eye exhibits no discharge.  Neck: Normal range of motion. Neck supple. No tracheal deviation present.  Cardiovascular: Normal rate, regular rhythm and normal heart sounds.  Exam reveals no friction rub.   No murmur heard. Pulmonary/Chest: Effort normal and breath sounds normal. No stridor. No respiratory distress. He has no wheezes. He has no rales. He exhibits no tenderness.  Abdominal: Soft. He exhibits no distension. There is no tenderness. There is no rebound and no guarding.  Neurological: He is alert and oriented to person, place, and time.  Skin: Skin is warm. There is erythema (Right volar aspect forearm, with tracking extending up to a decubital fossa; soft and slightly indurated region in the distal region; 3 cm purulent wound left volar proximal forearm).  Psychiatric: He has a normal mood and affect.    ED Course  Procedures (including critical care  time) Labs Reviewed  CBC WITH DIFFERENTIAL - Abnormal; Notable for the following:    HCT 38.6 (*)    Monocytes Absolute 1.1 (*)    All other components within normal limits  BASIC METABOLIC PANEL  - Abnormal; Notable for the following:    Glucose, Bld 112 (*)    All other components within normal limits  MRSA PCR SCREENING   No results found. 1. Cellulitis and abscess of upper arm and forearm   2. Episodic tobacco abuse    Results for orders placed during the hospital encounter of 11/13/12  CBC WITH DIFFERENTIAL      Result Value Range   WBC 9.2  4.0 - 10.5 K/uL   RBC 4.54  4.22 - 5.81 MIL/uL   Hemoglobin 13.5  13.0 - 17.0 g/dL   HCT 16.1 (*) 09.6 - 04.5 %   MCV 85.0  78.0 - 100.0 fL   MCH 29.7  26.0 - 34.0 pg   MCHC 35.0  30.0 - 36.0 g/dL   RDW 40.9  81.1 - 91.4 %   Platelets 319  150 - 400 K/uL   Neutrophils Relative % 67  43 - 77 %   Neutro Abs 6.2  1.7 - 7.7 K/uL   Lymphocytes Relative 18  12 - 46 %   Lymphs Abs 1.6  0.7 - 4.0 K/uL   Monocytes Relative 12  3 - 12 %   Monocytes Absolute 1.1 (*) 0.1 - 1.0 K/uL   Eosinophils Relative 3  0 - 5 %   Eosinophils Absolute 0.3  0.0 - 0.7 K/uL   Basophils Relative 0  0 - 1 %   Basophils Absolute 0.0  0.0 - 0.1 K/uL  BASIC METABOLIC PANEL      Result Value Range   Sodium 139  135 - 145 mEq/L   Potassium 3.7  3.5 - 5.1 mEq/L   Chloride 99  96 - 112 mEq/L   CO2 30  19 - 32 mEq/L   Glucose, Bld 112 (*) 70 - 99 mg/dL   BUN 10  6 - 23 mg/dL   Creatinine, Ser 7.82  0.50 - 1.35 mg/dL   Calcium 9.3  8.4 - 95.6 mg/dL   GFR calc non Af Amer >90  >90 mL/min   GFR calc Af Amer >90  >90 mL/min    MDM  Cristela Felt 47 y.o. presents with an abscess on his left forearm and worsening cellulitis on his right forearm. So is the right forearm likely not responding to antibiotics. Abscess in the left forearm continues to drain purulent material. No constitutional symptoms suggesting systemic disease at this time. Considering the patient is now failed outpatient management, inpatient antibiotic therapy indicated. Patient was given 600 mg of clindamycin here in the emergency department. White blood cell count was within normal limits.  Again he is afebrile and emergency department. Patient was admitted to event I discussed this patient's care to my attending, Dr. Jeraldine Loots.  Sena Hitch, MD 11/14/12 [phone]

## 2012-11-13 NOTE — ED Notes (Signed)
Areas of erythema around left arm wounds(s) marked, dated and timed.  Areas of redness & streaking to right arm marked, dated and timed.

## 2012-11-13 NOTE — ED Notes (Signed)
Pt.had a follow-up today for his lt. Abscess that was opened and drained and they report that the redness has increased.  rt. Abscess needs to be drained also.  Started  Antibiotics yesterday.  Denies any fevers.  Lt. Arm has a dressing with serosanqueous drainage and redness surrounding the abscess.   Pain has increased

## 2012-11-13 NOTE — ED Provider Notes (Signed)
Medical screening examination/treatment/procedure(s) were performed by resident physician or non-physician practitioner and as supervising physician I was immediately available for consultation/collaboration.   KINDL,JAMES DOUGLAS MD.   James D Kindl, MD 11/13/12 2057 

## 2012-11-14 ENCOUNTER — Encounter (HOSPITAL_COMMUNITY): Payer: Self-pay | Admitting: *Deleted

## 2012-11-14 ENCOUNTER — Observation Stay (HOSPITAL_COMMUNITY): Payer: MEDICAID

## 2012-11-14 DIAGNOSIS — IMO0002 Reserved for concepts with insufficient information to code with codable children: Principal | ICD-10-CM

## 2012-11-14 LAB — CULTURE, ROUTINE-ABSCESS

## 2012-11-14 LAB — MRSA PCR SCREENING: MRSA by PCR: POSITIVE — AB

## 2012-11-14 MED ORDER — MUPIROCIN 2 % EX OINT
1.0000 "application " | TOPICAL_OINTMENT | Freq: Two times a day (BID) | CUTANEOUS | Status: DC
  Administered 2012-11-14 – 2012-11-18 (×9): 1 via NASAL
  Filled 2012-11-14: qty 22

## 2012-11-14 MED ORDER — CHLORHEXIDINE GLUCONATE CLOTH 2 % EX PADS
6.0000 | MEDICATED_PAD | Freq: Every morning | CUTANEOUS | Status: DC
  Administered 2012-11-14 – 2012-11-17 (×3): 6 via TOPICAL

## 2012-11-14 MED ORDER — CHLORHEXIDINE GLUCONATE CLOTH 2 % EX PADS: 6.0000 | MEDICATED_PAD | Freq: Every day | CUTANEOUS | Status: DC

## 2012-11-14 NOTE — Progress Notes (Signed)
Admission Note   Pt arrived to 6E04 via w/c from the ED. A&OX4. No distress noted, VSS, denies pain. Pt being admitted with cellulitis/abscess of bilat upper extremities. Pt's wounds on his arms were measured by the ED nurse and currently have dressings intact. Pt has a history of MRSA and has been placed on orange contact precautions. Oriented to unit and surroundings. Call bell within reach. C.Osaze Hubbert, RN.

## 2012-11-14 NOTE — Progress Notes (Signed)
11:11 AM I agree with HPI/GPe and A/P per Dr. Houston Siren      47 year old Caucasian male known tobacco use are works at a [employer] arrived presented to emergency room 7/10 a.m. with oozing abscess from left forearm. He states that this actually started probably on 7/4 thousand 14 and clean it with peroxide and took 2 antibiotic pills for this He had an incision and drainage performed on the left arm and return for wound check on 11/13/2012. He is compliant on Septra DS was prescribed initially but was noted that the drainage had increased.      Patient Active Problem List   Diagnosis Date Noted  . Cellulitis and abscess of upper arm and forearm 11/13/2012  . Episodic tobacco abuse 11/13/2012   Assessment/plan-we will continue with the plan as per prior to rule out deeper abscess that is tracking. If needed we will consult hand surgery as results come back. He will continue on IV clindamycin as his initial culture showed MRSA  Pleas Koch, MD [facility] [phone]

## 2012-11-14 NOTE — H&P (Signed)
[facility] History and Physical  Christopher [name] ZOX:[id number] DOB: [birth year]    PCP:  None.  Chief Complaint: increase purulent discharge on the left forearm.  HPI: Christopher [name] is an 47 y.o. male with benign PMH, tobacco user, presents to the ER with increase purulent discharge on his left forearm, with increase swelling and erythema.   He had a pustule on his left forearm along with a smaller right forearm, and was seen at an urgent care, where he was given an I and D, with Percocet and Bactrim a few days ago.  He returned to the urgent care for follow up visit where he was n't better, so he was directed here in the ER.  Evaluation in the ER included an unremarkable serology.  Hospitalist was asked to admit him for outpatient treatment failure of cellulitis.  Rewiew of Systems:  Constitutional: Negative for malaise, fever and chills. No significant weight loss or weight gain Eyes: Negative for eye pain, redness and discharge, diplopia, visual changes, or flashes of light. ENMT: Negative for ear pain, hoarseness, nasal congestion, sinus pressure and sore throat. No headaches; tinnitus, drooling, or problem swallowing. Cardiovascular: Negative for chest pain, palpitations, diaphoresis, dyspnea and peripheral edema. ; No orthopnea, PND Respiratory: Negative for cough, hemoptysis, wheezing and stridor. No pleuritic chestpain. Gastrointestinal: Negative for nausea, vomiting, diarrhea, constipation, abdominal pain, melena, blood in stool, hematemesis, jaundice and rectal bleeding.    Genitourinary: Negative for frequency, dysuria, incontinence,flank pain and hematuria; Musculoskeletal: Negative for back pain and neck pain. Neuro: Negative for headache, lightheadedness and neck stiffness. Negative for weakness, altered level of consciousness , altered mental status, extremity weakness, burning feet, involuntary movement, seizure and syncope.  Psych: negative for anxiety, depression,  insomnia, tearfulness, panic attacks, hallucinations, paranoia, suicidal or homicidal ideation    History reviewed. No pertinent past medical history.  History reviewed. No pertinent past surgical history.  Medications:  HOME MEDS: Prior to Admission medications   Medication Sig Start Date End Date Taking? Authorizing Provider  HYDROcodone-acetaminophen (NORCO/VICODIN) 5-325 MG per tablet Take 1 tablet by mouth every 4 (four) hours as needed for pain. 11/11/12  Yes Hayden Rasmussen, NP  sulfamethoxazole-trimethoprim (SEPTRA DS) 800-160 MG per tablet Take 1 tablet by mouth every 12 (twelve) hours. 11/11/12  Yes Hayden Rasmussen, NP     Allergies:  No Known Allergies  Social History:   reports that he has been smoking Cigarettes.  He has been smoking about 0.50 packs per day. He does not have any smokeless tobacco history on file. He reports that  drinks alcohol. He reports that he uses illicit drugs ("Crack" cocaine).  Family History: No family history on file.   Physical Exam: Filed Vitals:   11/13/12 1733 11/13/12 2324  BP: 147/73 124/68  Pulse: 67 63  Temp: 98.2 F (36.8 C) 98.4 F (36.9 C)  TempSrc: Oral Oral  Resp: 18 18  Height:  5\' 9"  (1.753 m)  Weight:  58.333 kg (128 lb 9.6 oz)  SpO2: 98% 98%   Blood pressure 124/68, pulse 63, temperature 98.4 F (36.9 C), temperature source Oral, resp. rate 18, height 5\' 9"  (1.753 m), weight 58.333 kg (128 lb 9.6 oz), SpO2 98.00%.  GEN:  Pleasant  patient lying in the stretcher in no acute distress; cooperative with exam. PSYCH:  alert and oriented x4; does not appear anxious or depressed; affect is appropriate. HEENT: Mucous membranes pink and anicteric; PERRLA; EOM intact; no cervical lymphadenopathy nor thyromegaly or carotid bruit; no JVD;  There were no stridor. Neck is very supple. Breasts:: Not examined CHEST WALL: No tenderness CHEST: Normal respiration, clear to auscultation bilaterally.  HEART: Regular rate and rhythm.  There are  no murmur, rub, or gallops.   BACK: No kyphosis or scoliosis; no CVA tenderness ABDOMEN: soft and non-tender; no masses, no organomegaly, normal abdominal bowel sounds; no pannus; no intertriginous candida. There is no rebound and no distention. Rectal Exam: Not done EXTREMITIES: No bone or joint deformity; age-appropriate arthropathy of the hands and knees; no edema; no ulcerations.  There is no calf tenderness. Genitalia: not examined PULSES: 2+ and symmetric SKIN:  He has some red streak proximally on the right forearm.  Left forearm with pustule and purulent discharge.  CNS: Cranial nerves 2-12 grossly intact no focal lateralizing neurologic deficit.  Speech is fluent; uvula elevated with phonation, facial symmetry and tongue midline. DTR are normal bilaterally, cerebella exam is intact, barbinski is negative and strengths are equaled bilaterally.  No sensory loss.   Labs on Admission:  Basic Metabolic Panel:  Recent Labs Lab 11/13/12 2131  NA 139  K 3.7  CL 99  CO2 30  GLUCOSE 112*  BUN 10  CREATININE 0.92  CALCIUM 9.3   Liver Function Tests: No results found for this basename: AST, ALT, ALKPHOS, BILITOT, PROT, ALBUMIN,  in the last 168 hours No results found for this basename: LIPASE, AMYLASE,  in the last 168 hours No results found for this basename: AMMONIA,  in the last 168 hours CBC:  Recent Labs Lab 11/13/12 2131  WBC 9.2  NEUTROABS 6.2  HGB 13.5  HCT 38.6*  MCV 85.0  PLT 319   Cardiac Enzymes: No results found for this basename: CKTOTAL, CKMB, CKMBINDEX, TROPONINI,  in the last 168 hours  CBG: No results found for this basename: GLUCAP,  in the last 168 hours   Radiological Exams on Admission: No results found.  Assessment/Plan Present on Admission:  . Cellulitis and abscess of upper arm and forearm . Episodic tobacco abuse  PLAN:  Will admit him for cellulitis and abcess of both forearms.  I have given him a adult dT and toxoid.  Will Tx with  clindamycin.  I will obtain xray of the left forearm to exclude osteo, but I don't think it will be positive this quickly.  He is stable, full code, and will be admitted to [facility] service.  Thank you.  Other plans as per orders.  Code Status: FULL Unk Lightning, MD. [facility] Pager [phone] 7pm to 7am.  11/14/2012, 3:07 AM

## 2012-11-14 NOTE — Progress Notes (Signed)
Critical value received of MRSA PCR+. Standing orders placed. MD on call notified. C.Shanoah Asbill, RN.

## 2012-11-15 LAB — CBC WITH DIFFERENTIAL/PLATELET
Basophils Relative: 0 % (ref 0–1)
Eosinophils Absolute: 0.2 10*3/uL (ref 0.0–0.7)
Eosinophils Relative: 3 % (ref 0–5)
MCH: 29.5 pg (ref 26.0–34.0)
MCHC: 33.9 g/dL (ref 30.0–36.0)
MCV: 87 fL (ref 78.0–100.0)
Neutrophils Relative %: 73 % (ref 43–77)
Platelets: 327 10*3/uL (ref 150–400)
RDW: 12.7 % (ref 11.5–15.5)

## 2012-11-15 LAB — BASIC METABOLIC PANEL
BUN: 10 mg/dL (ref 6–23)
Calcium: 9.1 mg/dL (ref 8.4–10.5)
GFR calc Af Amer: >90 mL/min (ref >90)
GFR calc non Af Amer: >90 mL/min (ref >90)
Potassium: 4.4 mEq/L (ref 3.5–5.1)
Sodium: 138 mEq/L (ref 135–145)

## 2012-11-15 NOTE — ED Provider Notes (Signed)
I evaluated the patient and discussed her care with our resident physician. I agree with his documentation, with the following additions.  (I also saw the relevant studies, including ECG (if performed) and agree with the interpretation).    Gerhard Munch, MD 11/15/12 [phone]

## 2012-11-15 NOTE — ED Notes (Signed)
Abscess culture L arm: Mod. MRSA.  Pt. adequately treated with Septra DS.  Needs notified. Vassie Moselle 11/15/2012

## 2012-11-15 NOTE — Clinical Documentation Improvement (Signed)
THIS DOCUMENT IS NOT A PERMANENT PART OF THE MEDICAL RECORD  Please update your documentation within the medical record to reflect your response to this query. If you need help knowing how to do this please call [phone].  11/15/12  Dear Dr. Mahala Menghini Marton Redwood  In a better effort to capture your patient's severity of illness, reflect appropriate length of stay and utilization of resources, a review of the medical record has revealed the following indicators.    Based on your clinical judgment, please clarify and document in a progress note and/or discharge summary the clinical condition associated with the following supporting information:  In responding to this query please exercise your independent judgment.  The fact that a query is asked, does not imply that any particular answer is desired or expected.  Abnormal findings (laboratory, x-ray, pathologic, and other diagnostic results) are not coded and reported unless the physician indicates their clinical significance.   The medical record reflects the following clinical findings, please clarify the diagnostic and/or clinical significance:      Possible Clinical Conditions?                            Supporting Information:  UTI                                           Lab Test:  Urine Culture Other Condition                              Patient Values: 11/14/2012 08:40  Cannot Clinically Determine SPECIMEN DESCRIPTION:URINE, CLEAN CATCH       SPECIAL REQUESTS:NONE       SET UP TIME:[id number]       \.in30\Performed at [facility]       COLONY COUNT:>=100,000 COLONIES/ML       \.in30\Performed at [facility]       CULTURE:ESCHERICHIA COLI       \.in30\Performed at [facility]       REPORT STATUS:PRELIMINARY         Reviewed: Delete=Wrong Patient   Thank You,  Nevin Bloodgood RN, BSN, CCDS Clinical Documentation Specialist [phone] Health Information Management [place]

## 2012-11-15 NOTE — Progress Notes (Signed)
PROGRESS NOTE  Christopher [name] ZOX:[id number] DOB: 11/08/1965 DOA: 11/13/2012 PCP: No PCP Per Patient  Brief narrative: 47 year old Caucasian male with abscess 11/08/2012 status post drainage present to emergency room from urgent care after wound check with losing abscess  Procedures:  X-ray of left arm 11/14/12  Antibiotics:  Clindamycin 11/13/2012   Subjective  He is much better. No distress. No fever no chills no shortness of breath no chest pain   Objective      Objective: Filed Vitals:   11/14/12 2145 11/15/12 0539 11/15/12 1030 11/15/12 1402  BP: 131/65 140/77 132/60 138/73  Pulse: 71 109 86 79  Temp: 98.5 F (36.9 C) 98.4 F (36.9 C) 98.3 F (36.8 C) 98.5 F (36.9 C)  TempSrc: Oral Oral Oral Oral  Resp: 18 18 18 18   Height: 5\' 9"  (1.753 m)     Weight: 60.102 kg (132 lb 8 oz)     SpO2: 98% 99% 99% 96%   No intake or output data in the 24 hours ending 11/15/12 1624  Exam:  General: Alert pleasant oriented watching TV Cardiovascular: S1-S2 no murmur rub or gallop Respiratory: Clinically clear Abdomen: Soft nontender Skin see prior notes. In addition right hand now has started to swell with pointing of area on the inner right Neuro intact  Data Reviewed: Basic Metabolic Panel:  Recent Labs Lab 11/13/12 2131 11/15/12 1044  NA 139 138  K 3.7 4.4  CL 99 102  CO2 30 29  GLUCOSE 112* 94  BUN 10 10  CREATININE 0.92 0.99  CALCIUM 9.3 9.1   Liver Function Tests: No results found for this basename: AST, ALT, ALKPHOS, BILITOT, PROT, ALBUMIN,  in the last 168 hours No results found for this basename: LIPASE, AMYLASE,  in the last 168 hours No results found for this basename: AMMONIA,  in the last 168 hours CBC:  Recent Labs Lab 11/13/12 2131 11/15/12 1044  WBC 9.2 7.0  NEUTROABS 6.2 5.1  HGB 13.5 13.2  HCT 38.6* 38.9*  MCV 85.0 87.0  PLT 319 327   Cardiac Enzymes: No results found for this basename: CKTOTAL, CKMB, CKMBINDEX, TROPONINI,  in the  last 168 hours BNP: No components found with this basename: POCBNP,  CBG: No results found for this basename: GLUCAP,  in the last 168 hours  Recent Results (from the past 240 hour(s))  CULTURE, ROUTINE-ABSCESS     Status: None   Collection Time    11/11/12  2:32 PM      Result Value Range Status   Specimen Description ABSCESS ARM LEFT   Final   Special Requests NONE   Final   Gram Stain     Final   Value: NO WBC SEEN     NO SQUAMOUS EPITHELIAL CELLS SEEN     RARE GRAM POSITIVE COCCI     IN PAIRS   Culture     Final   Value: MODERATE METHICILLIN RESISTANT STAPHYLOCOCCUS AUREUS     Note: RIFAMPIN AND GENTAMICIN SHOULD NOT BE USED AS SINGLE DRUGS FOR TREATMENT OF STAPH INFECTIONS. This organism DOES NOT demonstrate inducible Clindamycin resistance in vitro.   Report Status 11/14/2012 FINAL   Final   Organism ID, Bacteria METHICILLIN RESISTANT STAPHYLOCOCCUS AUREUS   Final  MRSA PCR SCREENING     Status: Abnormal   Collection Time    11/13/12 11:50 PM      Result Value Range Status   MRSA by PCR POSITIVE (*) NEGATIVE Final   Comment:  The GeneXpert MRSA Assay (FDA     approved for NASAL specimens     only), is one component of a     comprehensive MRSA colonization     surveillance program. It is not     intended to diagnose MRSA     infection nor to guide or     monitor treatment for     MRSA infections.     RESULT CALLED TO, READ BACK BY AND VERIFIED WITH:     CALLED TO RN Hortense Ramal [id number] @0410  THANEY     Studies:              All Imaging reviewed and is as per above notation   Scheduled Meds: . Chlorhexidine Gluconate Cloth  6 each Topical q morning - 10a  . clindamycin (CLEOCIN) IV  600 mg Intravenous Q6H  . docusate sodium  100 mg Oral BID  . mupirocin ointment  1 application Nasal BID  . sodium chloride  3 mL Intravenous Q12H   Continuous Infusions:    Assessment/Plan: 1. Abscesses-continue clindamycin. Growth from abscess shows MRSA.  He  will need 10-14 days of treatment for the same and understands this. He will need to be date chlorhexidine. 2. Tobacco abuse-counseled regarding cessation and poor healing if not 3. Borderline high blood pressure-monitor  Code Status: Full Family Communication: None at bedside Disposition Plan: Inpatient   Pleas Koch, MD  [facility] Pager [phone] 11/15/2012, 4:24 PM    LOS: 2 days

## 2012-11-16 LAB — CBC WITH DIFFERENTIAL/PLATELET
Basophils Relative: 1 % (ref 0–1)
Eosinophils Absolute: 0.3 10*3/uL (ref 0.0–0.7)
Hemoglobin: 12.9 g/dL — ABNORMAL LOW (ref 13.0–17.0)
MCH: 29.3 pg (ref 26.0–34.0)
MCHC: 34.1 g/dL (ref 30.0–36.0)
Monocytes Relative: 10 % (ref 3–12)
Neutro Abs: 4.9 10*3/uL (ref 1.7–7.7)
Neutrophils Relative %: 66 % (ref 43–77)
Platelets: 318 10*3/uL (ref 150–400)
RBC: 4.4 MIL/uL (ref 4.22–5.81)

## 2012-11-16 MED ORDER — LIDOCAINE HCL 1 % IJ SOLN
5.0000 mL | Freq: Once | INTRAMUSCULAR | Status: AC
  Administered 2012-11-16: 5 mL
  Filled 2012-11-16: qty 5

## 2012-11-16 NOTE — Progress Notes (Signed)
PROGRESS NOTE  WADE ASEBEDO JYN:[id number] DOB: 12-13-1965 DOA: 11/13/2012 PCP: No PCP Per Patient  Brief narrative: 47 year old Caucasian male with abscess 11/08/2012 status post drainage present to emergency room from urgent care after wound check with losing abscess  Procedures:  X-ray of left arm 11/14/12  Antibiotics:  Clindamycin 11/13/2012   Subjective  States R arm paining and another abscess has sprung up from the area which was just a small induration yesterday Doing fair otherwise Wishes to go home    Objective      Objective: Filed Vitals:   11/15/12 2035 11/16/12 0435 11/16/12 0925 11/16/12 1311  BP: 124/71 134/75 128/71 127/70  Pulse: 62 61 55 68  Temp: 98.6 F (37 C) 98.2 F (36.8 C) 97.6 F (36.4 C) 97.8 F (36.6 C)  TempSrc: Oral Oral    Resp: 18 18 18 17   Height: 5\' 9"  (1.753 m)     Weight: 60.102 kg (132 lb 8 oz)     SpO2: 96% 100% 97% 96%    Intake/Output Summary (Last 24 hours) at 11/16/12 1524 Last data filed at 11/16/12 1311  Gross per 24 hour  Intake   1490 ml  Output      0 ml  Net   1490 ml    Exam:  General: Alert pleasant oriented watching TV Cardiovascular: S1-S2 no murmur rub or gallop Respiratory: Clinically clear Abdomen: Soft nontender Skin see prior notes. In addition right hand now has started to swell with pointing of area on the inner right arm which is larger and more firm Neuro intact  Data Reviewed: Basic Metabolic Panel:  Recent Labs Lab 11/13/12 2131 11/15/12 1044  NA 139 138  K 3.7 4.4  CL 99 102  CO2 30 29  GLUCOSE 112* 94  BUN 10 10  CREATININE 0.92 0.99  CALCIUM 9.3 9.1   Liver Function Tests: No results found for this basename: AST, ALT, ALKPHOS, BILITOT, PROT, ALBUMIN,  in the last 168 hours No results found for this basename: LIPASE, AMYLASE,  in the last 168 hours No results found for this basename: AMMONIA,  in the last 168 hours CBC:  Recent Labs Lab 11/13/12 2131 11/15/12 1044  11/16/12 0615  WBC 9.2 7.0 7.4  NEUTROABS 6.2 5.1 4.9  HGB 13.5 13.2 12.9*  HCT 38.6* 38.9* 37.8*  MCV 85.0 87.0 85.9  PLT 319 327 318   Cardiac Enzymes: No results found for this basename: CKTOTAL, CKMB, CKMBINDEX, TROPONINI,  in the last 168 hours BNP: No components found with this basename: POCBNP,  CBG: No results found for this basename: GLUCAP,  in the last 168 hours  Recent Results (from the past 240 hour(s))  CULTURE, ROUTINE-ABSCESS     Status: None   Collection Time    11/11/12  2:32 PM      Result Value Range Status   Specimen Description ABSCESS ARM LEFT   Final   Special Requests NONE   Final   Gram Stain     Final   Value: NO WBC SEEN     NO SQUAMOUS EPITHELIAL CELLS SEEN     RARE GRAM POSITIVE COCCI     IN PAIRS   Culture     Final   Value: MODERATE METHICILLIN RESISTANT STAPHYLOCOCCUS AUREUS     Note: RIFAMPIN AND GENTAMICIN SHOULD NOT BE USED AS SINGLE DRUGS FOR TREATMENT OF STAPH INFECTIONS. This organism DOES NOT demonstrate inducible Clindamycin resistance in vitro.   Report Status 11/14/2012 FINAL  Final   Organism ID, Bacteria METHICILLIN RESISTANT STAPHYLOCOCCUS AUREUS   Final  MRSA PCR SCREENING     Status: Abnormal   Collection Time    11/13/12 11:50 PM      Result Value Range Status   MRSA by PCR POSITIVE (*) NEGATIVE Final   Comment:            The GeneXpert MRSA Assay (FDA     approved for NASAL specimens     only), is one component of a     comprehensive MRSA colonization     surveillance program. It is not     intended to diagnose MRSA     infection nor to guide or     monitor treatment for     MRSA infections.     RESULT CALLED TO, READ BACK BY AND VERIFIED WITH:     CALLED TO RN Hortense Ramal [id number] @0410  THANEY     Studies:              All Imaging reviewed and is as per above notation   Scheduled Meds: . Chlorhexidine Gluconate Cloth  6 each Topical q morning - 10a  . clindamycin (CLEOCIN) IV  600 mg Intravenous Q6H  .  docusate sodium  100 mg Oral BID  . lidocaine  5 mL Infiltration Once  . mupirocin ointment  1 application Nasal BID  . sodium chloride  3 mL Intravenous Q12H   Continuous Infusions:    Assessment/Plan: 1. Abscesses-continue clindamycin. Growth from abscess shows MRSA.  He will need 10-14 days of treatment for the same and understands this. He will need chlorhexidine baths 2. Abscess r forearm requesting rpt I & D -see note from 7/12 3. Tobacco abuse-counseled regarding cessation and poor healing if not 4. Borderline high blood pressure-monitor  Code Status: Full Family Communication: None at bedside Disposition Plan: Inpatient   Pleas Koch, MD  [facility] Pager [phone] 11/16/2012, 3:24 PM    LOS: 3 days

## 2012-11-16 NOTE — Procedures (Signed)
Procedure Note:  Verbal informed consent was obtained from the patient.  Risks and benefits were outlined with the patient.  Patient understands and accepts these risks.  Identity of the patient was confirmed verbally and by armband.    Procedure was performed as follows:  Incision and drainage of increasing swelling over R foarearm, 3x 3 cm Cleansed in usual sterile fashion anesthetized with 1% lidocaine Incision showed serosanguinous fluid, about 5 cc.  Area oozing subsequently and pressure bandage done  Patient tolerated the procedure well without any immediate complications.  Pleas Koch, MD [facility] [phone]

## 2012-11-17 ENCOUNTER — Telehealth (HOSPITAL_COMMUNITY): Payer: Self-pay | Admitting: *Deleted

## 2012-11-17 MED ORDER — LIDOCAINE HCL (PF) 1 % IJ SOLN
5.0000 mL | Freq: Once | INTRAMUSCULAR | Status: AC
  Administered 2012-11-17: 5 mL via INTRADERMAL
  Filled 2012-11-17: qty 5

## 2012-11-17 MED ORDER — SULFAMETHOXAZOLE-TMP DS 800-160 MG PO TABS
1.0000 | ORAL_TABLET | Freq: Two times a day (BID) | ORAL | Status: DC
  Administered 2012-11-17 – 2012-11-18 (×2): 1 via ORAL
  Filled 2012-11-17 (×3): qty 1

## 2012-11-17 MED ORDER — SULFAMETHOXAZOLE-TRIMETHOPRIM 800-160 MG PO TABS: 1.0000 | ORAL_TABLET | Freq: Two times a day (BID) | ORAL | Status: DC

## 2012-11-17 NOTE — Progress Notes (Signed)
PROGRESS NOTE  MARCIA LEPERA RUE:[id number] DOB: February 26, 1966 DOA: 11/13/2012 PCP: No PCP Per Patient  Brief narrative: 47 year old Caucasian male with abscess 11/08/2012 status post drainage present to emergency room from urgent care after wound check with losing abscess  Procedures:  X-ray of left arm 11/14/12  Antibiotics:  Clindamycin 11/13/2012--> 7/13  Bactrim 7/13 to complete course   Subjective  Had abscess drained by myself 7/12 Doing fair. No nausea vomiting or chest pain and really wants to go home. Tolerating diet well and ambulatory    Objective      Objective: Filed Vitals:   11/16/12 2121 11/17/12 0535 11/17/12 0912 11/17/12 1348  BP: 124/69 130/70 119/70 114/73  Pulse: 75 60 62 61  Temp: 98.7 F (37.1 C) 97.9 F (36.6 C) 98.5 F (36.9 C) 98.6 F (37 C)  TempSrc: Oral Oral    Resp: 20 20 18 18   Height:      Weight: 61.961 kg (136 lb 9.6 oz)     SpO2: 97% 99% 97% 98%    Intake/Output Summary (Last 24 hours) at 11/17/12 1632 Last data filed at 11/17/12 1349  Gross per 24 hour  Intake   1460 ml  Output      0 ml  Net   1460 ml    Exam:  General: Alert pleasant oriented watching TV Cardiovascular: S1-S2 no murmur rub or gallop Respiratory: Clinically clear Abdomen: Soft nontender Skin see prior notes. In addition right hand now has started to swell with pointing of area on the inner right arm which is larger and more firm-I&D was performed on 7/12 and subsequently was noted to have increasing pus 7/13 prompting another I&D. Neuro intact  Data Reviewed: Basic Metabolic Panel:  Recent Labs Lab 11/13/12 2131 11/15/12 1044  NA 139 138  K 3.7 4.4  CL 99 102  CO2 30 29  GLUCOSE 112* 94  BUN 10 10  CREATININE 0.92 0.99  CALCIUM 9.3 9.1   Liver Function Tests: No results found for this basename: AST, ALT, ALKPHOS, BILITOT, PROT, ALBUMIN,  in the last 168 hours No results found for this basename: LIPASE, AMYLASE,  in the last 168  hours No results found for this basename: AMMONIA,  in the last 168 hours CBC:  Recent Labs Lab 11/13/12 2131 11/15/12 1044 11/16/12 0615  WBC 9.2 7.0 7.4  NEUTROABS 6.2 5.1 4.9  HGB 13.5 13.2 12.9*  HCT 38.6* 38.9* 37.8*  MCV 85.0 87.0 85.9  PLT 319 327 318   Cardiac Enzymes: No results found for this basename: CKTOTAL, CKMB, CKMBINDEX, TROPONINI,  in the last 168 hours BNP: No components found with this basename: POCBNP,  CBG: No results found for this basename: GLUCAP,  in the last 168 hours  Recent Results (from the past 240 hour(s))  CULTURE, ROUTINE-ABSCESS     Status: None   Collection Time    11/11/12  2:32 PM      Result Value Range Status   Specimen Description ABSCESS ARM LEFT   Final   Special Requests NONE   Final   Gram Stain     Final   Value: NO WBC SEEN     NO SQUAMOUS EPITHELIAL CELLS SEEN     RARE GRAM POSITIVE COCCI     IN PAIRS   Culture     Final   Value: MODERATE METHICILLIN RESISTANT STAPHYLOCOCCUS AUREUS     Note: RIFAMPIN AND GENTAMICIN SHOULD NOT BE USED AS SINGLE DRUGS FOR TREATMENT OF STAPH INFECTIONS.  This organism DOES NOT demonstrate inducible Clindamycin resistance in vitro.   Report Status 11/14/2012 FINAL   Final   Organism ID, Bacteria METHICILLIN RESISTANT STAPHYLOCOCCUS AUREUS   Final  MRSA PCR SCREENING     Status: Abnormal   Collection Time    11/13/12 11:50 PM      Result Value Range Status   MRSA by PCR POSITIVE (*) NEGATIVE Final   Comment:            The GeneXpert MRSA Assay (FDA     approved for NASAL specimens     only), is one component of a     comprehensive MRSA colonization     surveillance program. It is not     intended to diagnose MRSA     infection nor to guide or     monitor treatment for     MRSA infections.     RESULT CALLED TO, READ BACK BY AND VERIFIED WITH:     CALLED TO RN Hortense Ramal [id number] @0410  THANEY     Studies:              All Imaging reviewed and is as per above notation   Scheduled  Meds: . Chlorhexidine Gluconate Cloth  6 each Topical q morning - 10a  . clindamycin (CLEOCIN) IV  600 mg Intravenous Q6H  . docusate sodium  100 mg Oral BID  . mupirocin ointment  1 application Nasal BID  . sodium chloride  3 mL Intravenous Q12H   Continuous Infusions:    Assessment/Plan: 1. Abscesses-continue clindamycin. Growth from abscess shows MRSA.  He will need 10-14 days of treatment for the same and understands this. He will need chlorhexidine baths. 2. Abscess r forearm-revision incision and drainage performed 7/13 to. C. nodes. Placed back on Bactrim dS 3. Tobacco abuse-counseled regarding cessation and poor healing if not 4. Borderline high blood pressure-monitor  Code Status: Full Family Communication: None at bedside Disposition Plan: Inpatient   Pleas Koch, MD  [facility] Pager [phone] 11/17/2012, 4:32 PM    LOS: 4 days

## 2012-11-17 NOTE — ED Notes (Signed)
Abscess culture L arm: Mod. MRSA.  Pt. adequately treated with Septra DS.  I called and pt.'s father said he was admitted to 63. I called pt.  He is aware he has MRSA he said he got one on the other arm and had a red streak going up his arm, so they admitted him for further treatment.  I gave him the [facility] MRSA instructions and he voiced understanding. Vassie Moselle 11/17/2012

## 2012-11-17 NOTE — Procedures (Signed)
Procedure Note:  Verbal informed consent was obtained from the patient.  Risks and benefits were outlined with the patient.  Patient understands and accepts these risks.  Identity of the patient was confirmed verbally and by armband.    Procedure was performed as follows:  Incision and drainage of prior abcess-incision extended and no frank pus demonstrated Cleaned in usual sterile fashion anesthetized with 1% lidocaine  Packed with iodoform dressing   Patient tolerated the procedure well without any immediate complications.  Pleas Koch, MD [facility] [phone]

## 2012-11-18 LAB — CBC
Hemoglobin: 13.5 g/dL (ref 13.0–17.0)
MCH: 29.2 pg (ref 26.0–34.0)
Platelets: 327 10*3/uL (ref 150–400)
RBC: 4.63 MIL/uL (ref 4.22–5.81)
WBC: 8.5 10*3/uL (ref 4.0–10.5)

## 2012-11-18 MED ORDER — SULFAMETHOXAZOLE-TMP DS 800-160 MG PO TABS: 1.0000 | ORAL_TABLET | Freq: Two times a day (BID) | ORAL | Status: DC

## 2012-11-18 MED ORDER — SULFAMETHOXAZOLE-TRIMETHOPRIM 800-160 MG PO TABS: 1.0000 | ORAL_TABLET | Freq: Two times a day (BID) | ORAL | Status: DC

## 2012-11-18 NOTE — Discharge Summary (Signed)
Rhetta Mura, MD Physician Signed Internal Medicine Progress Notes Service date: 11/18/2012 9:14 AM  Physician Discharge Summary    AIREN STIEHL ZOX:[id number] DOB: [birth year] DOA: 11/13/2012   PCP: No PCP Per Patient   Admit date: 11/13/2012 Discharge date: 11/18/2012   Time spent: 35 minutes   Recommendations for Outpatient Follow-up:   Recommend close followup and completion of septra for a total of 14 days-stop date 11/18/2012 Recommend wound check at urgent care Center   Discharge Diagnoses:   Principal Problem:   Cellulitis and abscess of upper arm and forearm Active Problems:   Episodic tobacco abuse   Discharge Condition: Stable   Diet recommendation: Regular    Filed Weights     11/15/12 2035  11/16/12 2121  11/17/12 2024   Weight:  60.102 kg (132 lb 8 oz)  61.961 kg (136 lb 9.6 oz)  61.961 kg (136 lb 9.6 oz)      History of present illness:   This pleasant 47 year old male was seen in the urgent care 11/12/2042 and abscess which was drained and placed on Bactrim.  he re-presented there for wound check 7/9/14and was found to have worsening looking abscess in addition to other areas of swelling. He was sent to the hospital and placed on IV clindamycin. In the hospital he developed another abscess on his right forearm which was incised and drained x2. Eventually the first abscess grew out MRSA which was pansensitive. He was placed on Bactrim DS once again to cover against MRSA and treat for total 14 days and I did discuss this with infectious disease specialist Dr. Orvan Falconer who felt that this was relatively appropriate. He will be followed up in the outpatient setting for followup in 3-4 days return at the center of this is healing well.   Hospital Course:      Procedures: I& D 7/12 I & D 7/13    Discharge Exam: Filed Vitals:     11/17/12 1705  11/17/12 2024  11/18/12 0543  11/18/12 0908   BP:  108/71  130/76  111/75  124/68   Pulse:  61  62  60   65   Temp:  98.7 F (37.1 C)  98.6 F (37 C)  98.4 F (36.9 C)  98.8 F (37.1 C)   TempSrc:    Oral  Oral  Oral   Resp:  17  18  18  20    Height:    5\' 9"  (1.753 m)       Weight:    61.961 kg (136 lb 9.6 oz)       SpO2:  96%  97%  98%  98%      General: alert pleasant oriented no apparent distress Cardiovascular: S1-S2 no murmur rub or gallop Respiratory: Clear Wounds: Left extensor aspect of forearm well-healed no exudate no pus no purulence and healing well   Wound on right inner arm 4x2 oozing slightly with blood no purulence induration decreased   Discharge Instructions    Discharge Orders     Future Orders  Complete By  Expires        Diet - low sodium heart healthy   As directed         Discharge instructions   As directed         Comments:         Follow up at the Urgent care center for follow-up of your wounds and cellulitis.  You need to see them in case things spread or  get worse and might need more antibiotics for this Finish a total of 14 days of antibiotics       Increase activity slowly   As directed              Medication List              HYDROcodone-acetaminophen 5-325 MG per tablet   Commonly known as:  NORCO/VICODIN   Take 1 tablet by mouth every 4 (four) hours as needed for pain.         sulfamethoxazole-trimethoprim 800-160 MG per tablet   Commonly known as:  BACTRIM DS   Take 1 tablet by mouth every 12 (twelve) hours.         sulfamethoxazole-trimethoprim 800-160 MG per tablet   Commonly known as:  SEPTRA DS   Take 1 tablet by mouth every 12 (twelve) hours.           No Known Allergies       Follow-up Information     Follow up with [facility]. (for wound check)      Contact information:     [address] [place] [address] [phone]          --------------------------------------------------------------------------------   The results of significant diagnostics from this  hospitalization (including imaging, microbiology, ancillary and laboratory) are listed below for reference.       Significant Diagnostic Studies: Dg Forearm Left   11/14/2012   *RADIOLOGY REPORT*  Clinical Data: History of soreness of the posterior aspect of the forearm just below the elbow.  LEFT FOREARM - 2 VIEW  Comparison: None.  Findings: Alignment is normal.  Joint spaces are preserved.  No fracture or dislocation is evident.  No soft tissue lesions are seen.  No opaque foreign body is evident.  IMPRESSION: No abnormality is identified.   Original Report Authenticated By: Onalee Hua Call      Microbiology: Recent Results (from the past 240 hour(s))   CULTURE, ROUTINE-ABSCESS     Status: None     Collection Time      11/11/12  2:32 PM       Result  Value  Range  Status     Specimen Description  ABSCESS ARM LEFT     Final     Special Requests  NONE     Final     Gram Stain        Final     Value:  NO WBC SEEN        NO SQUAMOUS EPITHELIAL CELLS SEEN        RARE GRAM POSITIVE COCCI        IN PAIRS     Culture        Final     Value:  MODERATE METHICILLIN RESISTANT STAPHYLOCOCCUS AUREUS        Note: RIFAMPIN AND GENTAMICIN SHOULD NOT BE USED AS SINGLE DRUGS FOR TREATMENT OF STAPH INFECTIONS. This organism DOES NOT demonstrate inducible Clindamycin resistance in vitro.     Report Status  11/14/2012 FINAL     Final     Organism ID, Bacteria  METHICILLIN RESISTANT STAPHYLOCOCCUS AUREUS     Final   MRSA PCR SCREENING     Status: Abnormal     Collection Time      11/13/12 11:50 PM       Result  Value  Range  Status     MRSA by PCR  POSITIVE (*)  NEGATIVE  Final     Comment:                The GeneXpert MRSA Assay (FDA        approved for NASAL specimens        only), is one component of a        comprehensive MRSA colonization        surveillance program. It is not        intended to diagnose MRSA        infection nor to guide or        monitor treatment for        MRSA  infections.        RESULT CALLED TO, READ BACK BY AND VERIFIED WITH:        CALLED TO RN ECKELMANN,CYBILL [id number] @0410  THANEY      Labs: Basic Metabolic Panel: Recent Labs Lab  11/13/12 2131  11/15/12 1044   NA  139  138   K  3.7  4.4   CL  99  102   CO2  30  29   GLUCOSE  112*  94   BUN  10  10   CREATININE  0.92  0.99   CALCIUM  9.3  9.1    Liver Function Tests: No results found for this basename: AST, ALT, ALKPHOS, BILITOT, PROT, ALBUMIN,  in the last 168 hours No results found for this basename: LIPASE, AMYLASE,  in the last 168 hours No results found for this basename: AMMONIA,  in the last 168 hours CBC: Recent Labs Lab  11/13/12 2131  11/15/12 1044  11/16/12 0615  11/18/12 0400   WBC  9.2  7.0  7.4  8.5   NEUTROABS  6.2  5.1  4.9   --    HGB  13.5  13.2  12.9*  13.5   HCT  38.6*  38.9*  37.8*  40.4   MCV  85.0  87.0  85.9  87.3   PLT  319  327  318  327    Cardiac Enzymes: No results found for this basename: CKTOTAL, CKMB, CKMBINDEX, TROPONINI,  in the last 168 hours BNP: BNP (last 3 results) No results found for this basename: PROBNP,  in the last 8760 hours CBG: No results found for this basename: GLUCAP,  in the last 168 hours         Signed:   Rhetta Mura        [facility] 11/18/2012, 9:14 AM

## 2012-11-18 NOTE — Progress Notes (Deleted)
Physician Discharge Summary  Christopher [name] ZOX:[id number] DOB: [birth year] DOA: 11/13/2012  PCP: No PCP Per Patient  Admit date: 11/13/2012 Discharge date: 11/18/2012  Time spent: 35 minutes  Recommendations for Outpatient Follow-up:  1. Recommend close followup and completion of septra for a total of 14 days-stop date 11/18/2012 2. Recommend wound check at urgent care Center  Discharge Diagnoses:  Principal Problem:   Cellulitis and abscess of upper arm and forearm Active Problems:   Episodic tobacco abuse   Discharge Condition: Stable  Diet recommendation: Regular  Filed Weights   11/15/12 2035 11/16/12 2121 11/17/12 2024  Weight: 60.102 kg (132 lb 8 oz) 61.961 kg (136 lb 9.6 oz) 61.961 kg (136 lb 9.6 oz)    History of present illness:  This pleasant 47 year old male was seen in the urgent care 11/12/2042 and abscess which was drained and placed on Bactrim.  he re-presented there for wound check 7/9/14and was found to have worsening looking abscess in addition to other areas of swelling. He was sent to the hospital and placed on IV clindamycin. In the hospital he developed another abscess on his right forearm which was incised and drained x2. Eventually the first abscess grew out MRSA which was pansensitive. He was placed on Bactrim DS once again to cover against MRSA and treat for total 14 days and I did discuss this with infectious disease specialist Dr. Orvan Falconer who felt that this was relatively appropriate. He will be followed up in the outpatient setting for followup in 3-4 days return at the center of this is healing well.  Hospital Course:    Procedures:  I& D 7/12  I & D 7/13   Discharge Exam: Filed Vitals:   11/17/12 1705 11/17/12 2024 11/18/12 0543 11/18/12 0908  BP: 108/71 130/76 111/75 124/68  Pulse: 61 62 60 65  Temp: 98.7 F (37.1 C) 98.6 F (37 C) 98.4 F (36.9 C) 98.8 F (37.1 C)  TempSrc:  Oral Oral Oral  Resp: 17 18 18 20   Height:  5\' 9"  (1.753  m)    Weight:  61.961 kg (136 lb 9.6 oz)    SpO2: 96% 97% 98% 98%    General: alert pleasant oriented no apparent distress Cardiovascular: S1-S2 no murmur rub or gallop Respiratory: Clear Wounds: Left extensor aspect of forearm well-healed no exudate no pus no purulence and healing well  Wound on right inner arm 4x2 oozing slightly with blood no purulence induration decreased  Discharge Instructions  Discharge Orders   Future Orders Complete By Expires     Diet - low sodium heart healthy  As directed     Discharge instructions  As directed     Comments:      Follow up at the Urgent care center for follow-up of your wounds and cellulitis.  You need to see them in case things spread or get worse and might need more antibiotics for this Finish a total of 14 days of antibiotics    Increase activity slowly  As directed         Medication List         HYDROcodone-acetaminophen 5-325 MG per tablet  Commonly known as:  NORCO/VICODIN  Take 1 tablet by mouth every 4 (four) hours as needed for pain.     sulfamethoxazole-trimethoprim 800-160 MG per tablet  Commonly known as:  BACTRIM DS  Take 1 tablet by mouth every 12 (twelve) hours.     sulfamethoxazole-trimethoprim 800-160 MG per tablet  Commonly known as:  SEPTRA DS  Take 1 tablet by mouth every 12 (twelve) hours.       No Known Allergies     Follow-up Information   Follow up with [facility]. (for wound check)    Contact information:   [address] [place] [address] [phone]       The results of significant diagnostics from this hospitalization (including imaging, microbiology, ancillary and laboratory) are listed below for reference.    Significant Diagnostic Studies: Dg Forearm Left  11/14/2012   *RADIOLOGY REPORT*  Clinical Data: History of soreness of the posterior aspect of the forearm just below the elbow.  LEFT FOREARM - 2 VIEW  Comparison: None.  Findings:  Alignment is normal.  Joint spaces are preserved.  No fracture or dislocation is evident.  No soft tissue lesions are seen.  No opaque foreign body is evident.  IMPRESSION: No abnormality is identified.   Original Report Authenticated By: Onalee Hua Call    Microbiology: Recent Results (from the past 240 hour(s))  CULTURE, ROUTINE-ABSCESS     Status: None   Collection Time    11/11/12  2:32 PM      Result Value Range Status   Specimen Description ABSCESS ARM LEFT   Final   Special Requests NONE   Final   Gram Stain     Final   Value: NO WBC SEEN     NO SQUAMOUS EPITHELIAL CELLS SEEN     RARE GRAM POSITIVE COCCI     IN PAIRS   Culture     Final   Value: MODERATE METHICILLIN RESISTANT STAPHYLOCOCCUS AUREUS     Note: RIFAMPIN AND GENTAMICIN SHOULD NOT BE USED AS SINGLE DRUGS FOR TREATMENT OF STAPH INFECTIONS. This organism DOES NOT demonstrate inducible Clindamycin resistance in vitro.   Report Status 11/14/2012 FINAL   Final   Organism ID, Bacteria METHICILLIN RESISTANT STAPHYLOCOCCUS AUREUS   Final  MRSA PCR SCREENING     Status: Abnormal   Collection Time    11/13/12 11:50 PM      Result Value Range Status   MRSA by PCR POSITIVE (*) NEGATIVE Final   Comment:            The GeneXpert MRSA Assay (FDA     approved for NASAL specimens     only), is one component of a     comprehensive MRSA colonization     surveillance program. It is not     intended to diagnose MRSA     infection nor to guide or     monitor treatment for     MRSA infections.     RESULT CALLED TO, READ BACK BY AND VERIFIED WITH:     CALLED TO RN ECKELMANN,CYBILL [id number] @0410  THANEY     Labs: Basic Metabolic Panel:  Recent Labs Lab 11/13/12 2131 11/15/12 1044  NA 139 138  K 3.7 4.4  CL 99 102  CO2 30 29  GLUCOSE 112* 94  BUN 10 10  CREATININE 0.92 0.99  CALCIUM 9.3 9.1   Liver Function Tests: No results found for this basename: AST, ALT, ALKPHOS, BILITOT, PROT, ALBUMIN,  in the last 168 hours No  results found for this basename: LIPASE, AMYLASE,  in the last 168 hours No results found for this basename: AMMONIA,  in the last 168 hours CBC:  Recent Labs Lab 11/13/12 2131 11/15/12 1044 11/16/12 0615 11/18/12 0400  WBC 9.2 7.0 7.4 8.5  NEUTROABS 6.2 5.1 4.9  --  HGB 13.5 13.2 12.9* 13.5  HCT 38.6* 38.9* 37.8* 40.4  MCV 85.0 87.0 85.9 87.3  PLT 319 327 318 327   Cardiac Enzymes: No results found for this basename: CKTOTAL, CKMB, CKMBINDEX, TROPONINI,  in the last 168 hours BNP: BNP (last 3 results) No results found for this basename: PROBNP,  in the last 8760 hours CBG: No results found for this basename: GLUCAP,  in the last 168 hours     Signed:  Rhetta Mura  [facility] 11/18/2012, 9:14 AM

## 2012-12-02 ENCOUNTER — Emergency Department (INDEPENDENT_AMBULATORY_CARE_PROVIDER_SITE_OTHER)
Admission: EM | Admit: 2012-12-02 | Discharge: 2012-12-02 | Disposition: A | Discharge status: Home / Self Care | Payer: Self-pay | Source: Home / Self Care

## 2012-12-02 ENCOUNTER — Encounter (HOSPITAL_COMMUNITY): Payer: Self-pay | Admitting: *Deleted

## 2012-12-02 DIAGNOSIS — IMO0002 Reserved for concepts with insufficient information to code with codable children: Secondary | ICD-10-CM

## 2012-12-02 MED ORDER — MUPIROCIN CALCIUM 2 % EX CREA: TOPICAL_CREAM | Freq: Three times a day (TID) | CUTANEOUS | Status: DC

## 2012-12-02 NOTE — ED Provider Notes (Signed)
Christopher [name] is a 47 y.o. male who presents to Urgent Care today for abscess followup. Patient was seen initially at the [facility] urgent care in early July for abscess. This was drained and treated with Septra. However he continued to worsen and was seen in the emergency room and started on IV antibiotics and admitted to the hospital. He developed a second abscess which was additionally drained. Wound culture shows multiple sensitive MRSA. He has finished his antibiotic course and feels well. He now has any pain or fever and the abscess is healing well.    PMH reviewed. Healthy otherwise History  Substance Use Topics  . Smoking status: Current Every Day Smoker -- 0.50 packs/day    Types: Cigarettes  . Smokeless tobacco: Former Neurosurgeon  . Alcohol Use: Yes     Comment: 2-3 "sparks" a day   ROS as above Medications reviewed. No current facility-administered medications for this encounter.   Current Outpatient Prescriptions  Medication Sig Dispense Refill  . mupirocin cream (BACTROBAN) 2 % Apply topically 3 (three) times daily.  30 g  0    Exam:  BP 107/70  Pulse 64  Temp(Src) 97.6 F (36.4 C) (Oral)  Resp 18  SpO2 97% Gen: Well NAD SKIN: 2 small areas of healing wounds on his bilateral upper extremities with some surrounding redness but no tenderness or induration. Well-appearing.   No results found for this or any previous visit (from the past 24 hour(s)). No results found.  Assessment and Plan: 47 y.o. male with well healing abscess.  Doing well. Patient has completed antibiotic course.  Patient has MRSA likely is colonized.  Plan to provide with mupirocin in case he develops superficial impetigo in the future.  Followup with primary care Dr. Or back here as needed Discussed warning signs or symptoms. Please see discharge instructions. Patient expresses understanding.     Christopher Bong, MD 12/02/12 1322

## 2012-12-02 NOTE — ED Notes (Signed)
Pt is here for wound check

## 2021-01-06 DIAGNOSIS — I639 Cerebral infarction, unspecified: Secondary | ICD-10-CM

## 2021-01-06 HISTORY — DX: Cerebral infarction, unspecified: I63.9

## 2021-01-10 ENCOUNTER — Observation Stay (HOSPITAL_COMMUNITY): Payer: Medicaid Other

## 2021-01-10 ENCOUNTER — Observation Stay (HOSPITAL_COMMUNITY): Payer: Self-pay

## 2021-01-10 ENCOUNTER — Other Ambulatory Visit: Payer: Self-pay

## 2021-01-10 ENCOUNTER — Other Ambulatory Visit (HOSPITAL_COMMUNITY): Payer: Self-pay

## 2021-01-10 ENCOUNTER — Emergency Department (HOSPITAL_COMMUNITY): Payer: Self-pay

## 2021-01-10 ENCOUNTER — Inpatient Hospital Stay (HOSPITAL_COMMUNITY)
Admission: EM | Admit: 2021-01-10 | Discharge: 2021-01-12 | DRG: 917 | Disposition: A | Discharge status: Rehabilitation Facility | Payer: Medicaid Other | Attending: Internal Medicine | Admitting: Internal Medicine

## 2021-01-10 DIAGNOSIS — N179 Acute kidney failure, unspecified: Secondary | ICD-10-CM | POA: Diagnosis present

## 2021-01-10 DIAGNOSIS — R531 Weakness: Secondary | ICD-10-CM

## 2021-01-10 DIAGNOSIS — F17210 Nicotine dependence, cigarettes, uncomplicated: Secondary | ICD-10-CM | POA: Diagnosis present

## 2021-01-10 DIAGNOSIS — R4781 Slurred speech: Secondary | ICD-10-CM | POA: Diagnosis present

## 2021-01-10 DIAGNOSIS — Z7151 Drug abuse counseling and surveillance of drug abuser: Secondary | ICD-10-CM

## 2021-01-10 DIAGNOSIS — F1910 Other psychoactive substance abuse, uncomplicated: Secondary | ICD-10-CM

## 2021-01-10 DIAGNOSIS — F1410 Cocaine abuse, uncomplicated: Secondary | ICD-10-CM | POA: Diagnosis not present

## 2021-01-10 DIAGNOSIS — Z833 Family history of diabetes mellitus: Secondary | ICD-10-CM

## 2021-01-10 DIAGNOSIS — I10 Essential (primary) hypertension: Secondary | ICD-10-CM

## 2021-01-10 DIAGNOSIS — Z801 Family history of malignant neoplasm of trachea, bronchus and lung: Secondary | ICD-10-CM

## 2021-01-10 DIAGNOSIS — Z716 Tobacco abuse counseling: Secondary | ICD-10-CM

## 2021-01-10 DIAGNOSIS — I672 Cerebral atherosclerosis: Secondary | ICD-10-CM | POA: Diagnosis present

## 2021-01-10 DIAGNOSIS — I639 Cerebral infarction, unspecified: Secondary | ICD-10-CM | POA: Diagnosis not present

## 2021-01-10 DIAGNOSIS — R471 Dysarthria and anarthria: Secondary | ICD-10-CM | POA: Diagnosis present

## 2021-01-10 DIAGNOSIS — Z713 Dietary counseling and surveillance: Secondary | ICD-10-CM

## 2021-01-10 DIAGNOSIS — I6389 Other cerebral infarction: Secondary | ICD-10-CM | POA: Diagnosis not present

## 2021-01-10 DIAGNOSIS — R278 Other lack of coordination: Secondary | ICD-10-CM | POA: Diagnosis present

## 2021-01-10 DIAGNOSIS — E871 Hypo-osmolality and hyponatremia: Secondary | ICD-10-CM

## 2021-01-10 DIAGNOSIS — D72829 Elevated white blood cell count, unspecified: Secondary | ICD-10-CM | POA: Diagnosis present

## 2021-01-10 DIAGNOSIS — Z823 Family history of stroke: Secondary | ICD-10-CM

## 2021-01-10 DIAGNOSIS — R2681 Unsteadiness on feet: Secondary | ICD-10-CM | POA: Diagnosis present

## 2021-01-10 DIAGNOSIS — I351 Nonrheumatic aortic (valve) insufficiency: Secondary | ICD-10-CM | POA: Diagnosis present

## 2021-01-10 DIAGNOSIS — R7303 Prediabetes: Secondary | ICD-10-CM | POA: Diagnosis present

## 2021-01-10 DIAGNOSIS — I6381 Other cerebral infarction due to occlusion or stenosis of small artery: Secondary | ICD-10-CM | POA: Diagnosis present

## 2021-01-10 DIAGNOSIS — T405X1A Poisoning by cocaine, accidental (unintentional), initial encounter: Principal | ICD-10-CM | POA: Diagnosis present

## 2021-01-10 DIAGNOSIS — I6330 Cerebral infarction due to thrombosis of unspecified cerebral artery: Secondary | ICD-10-CM | POA: Insufficient documentation

## 2021-01-10 DIAGNOSIS — Z8249 Family history of ischemic heart disease and other diseases of the circulatory system: Secondary | ICD-10-CM

## 2021-01-10 DIAGNOSIS — F1210 Cannabis abuse, uncomplicated: Secondary | ICD-10-CM | POA: Diagnosis present

## 2021-01-10 DIAGNOSIS — R29703 NIHSS score 3: Secondary | ICD-10-CM | POA: Diagnosis present

## 2021-01-10 DIAGNOSIS — Z20822 Contact with and (suspected) exposure to covid-19: Secondary | ICD-10-CM | POA: Diagnosis present

## 2021-01-10 DIAGNOSIS — G8194 Hemiplegia, unspecified affecting left nondominant side: Secondary | ICD-10-CM | POA: Diagnosis present

## 2021-01-10 DIAGNOSIS — Z8673 Personal history of transient ischemic attack (TIA), and cerebral infarction without residual deficits: Secondary | ICD-10-CM

## 2021-01-10 DIAGNOSIS — E785 Hyperlipidemia, unspecified: Secondary | ICD-10-CM | POA: Diagnosis present

## 2021-01-10 DIAGNOSIS — Y92009 Unspecified place in unspecified non-institutional (private) residence as the place of occurrence of the external cause: Secondary | ICD-10-CM

## 2021-01-10 DIAGNOSIS — R270 Ataxia, unspecified: Secondary | ICD-10-CM | POA: Diagnosis present

## 2021-01-10 DIAGNOSIS — Z79899 Other long term (current) drug therapy: Secondary | ICD-10-CM

## 2021-01-10 DIAGNOSIS — Z720 Tobacco use: Secondary | ICD-10-CM | POA: Diagnosis present

## 2021-01-10 DIAGNOSIS — I493 Ventricular premature depolarization: Secondary | ICD-10-CM | POA: Diagnosis present

## 2021-01-10 LAB — COMPREHENSIVE METABOLIC PANEL
ALT: 18 U/L (ref 0–44)
AST: 23 U/L (ref 15–41)
Albumin: 3.8 g/dL (ref 3.5–5.0)
Alkaline Phosphatase: 58 U/L (ref 38–126)
Anion gap: 7 (ref 5–15)
BUN: 12 mg/dL (ref 6–20)
CO2: 27 mmol/L (ref 22–32)
Calcium: 9.2 mg/dL (ref 8.9–10.3)
Chloride: 103 mmol/L (ref 98–111)
Creatinine, Ser: 1.28 mg/dL — ABNORMAL HIGH (ref 0.61–1.24)
GFR, Estimated: >60 mL/min (ref >60)
Glucose, Bld: 114 mg/dL — ABNORMAL HIGH (ref 70–99)
Potassium: 4.5 mmol/L (ref 3.5–5.1)
Sodium: 137 mmol/L (ref 135–145)
Total Bilirubin: 0.8 mg/dL (ref 0.3–1.2)
Total Protein: 7 g/dL (ref 6.5–8.1)

## 2021-01-10 LAB — DIFFERENTIAL
Abs Immature Granulocytes: 0.04 10*3/uL (ref 0.00–0.07)
Basophils Absolute: 0.1 10*3/uL (ref 0.0–0.1)
Basophils Relative: 1 %
Eosinophils Absolute: 0.1 10*3/uL (ref 0.0–0.5)
Eosinophils Relative: 1 %
Immature Granulocytes: 0 %
Lymphocytes Relative: 14 %
Lymphs Abs: 2 10*3/uL (ref 0.7–4.0)
Monocytes Absolute: 0.9 10*3/uL (ref 0.1–1.0)
Monocytes Relative: 6 %
Neutro Abs: 11 10*3/uL — ABNORMAL HIGH (ref 1.7–7.7)
Neutrophils Relative %: 78 %

## 2021-01-10 LAB — PROTIME-INR
INR: 1 (ref 0.8–1.2)
Prothrombin Time: 12.6 seconds (ref 11.4–15.2)

## 2021-01-10 LAB — ECHOCARDIOGRAM COMPLETE
AR max vel: 2.86 cm2
AV Area VTI: 3.17 cm2
AV Area mean vel: 2.75 cm2
AV Mean grad: 4 mmHg
AV Peak grad: 6.5 mmHg
Ao pk vel: 1.27 m/s
Area-P 1/2: 3.46 cm2
S' Lateral: 2.7 cm

## 2021-01-10 LAB — CBC
HCT: 48.5 % (ref 39.0–52.0)
Hemoglobin: 15.7 g/dL (ref 13.0–17.0)
MCH: 29.4 pg (ref 26.0–34.0)
MCHC: 32.4 g/dL (ref 30.0–36.0)
MCV: 90.8 fL (ref 80.0–100.0)
Platelets: 319 10*3/uL (ref 150–400)
RBC: 5.34 MIL/uL (ref 4.22–5.81)
RDW: 12 % (ref 11.5–15.5)
WBC: 14.1 10*3/uL — ABNORMAL HIGH (ref 4.0–10.5)
nRBC: 0 % (ref 0.0–0.2)

## 2021-01-10 LAB — RAPID URINE DRUG SCREEN, HOSP PERFORMED
Amphetamines: NOT DETECTED
Barbiturates: NOT DETECTED
Benzodiazepines: NOT DETECTED
Cocaine: POSITIVE — AB
Opiates: NOT DETECTED
Tetrahydrocannabinol: POSITIVE — AB

## 2021-01-10 LAB — CBG MONITORING, ED: Glucose-Capillary: 86 mg/dL (ref 70–99)

## 2021-01-10 LAB — HIV ANTIBODY (ROUTINE TESTING W REFLEX): HIV Screen 4th Generation wRfx: NONREACTIVE

## 2021-01-10 LAB — SARS CORONAVIRUS 2 (TAT 6-24 HRS): SARS Coronavirus 2: NEGATIVE

## 2021-01-10 LAB — APTT: aPTT: 31 seconds (ref 24–36)

## 2021-01-10 MED ORDER — ASPIRIN 325 MG PO TABS
325.0000 mg | ORAL_TABLET | Freq: Once | ORAL | Status: DC
  Filled 2021-01-10: qty 1

## 2021-01-10 MED ORDER — ACETAMINOPHEN 650 MG RE SUPP: 650.0000 mg | RECTAL | Status: DC | PRN

## 2021-01-10 MED ORDER — ACETAMINOPHEN 325 MG PO TABS: 650.0000 mg | ORAL_TABLET | ORAL | Status: DC | PRN

## 2021-01-10 MED ORDER — STROKE: EARLY STAGES OF RECOVERY BOOK
Freq: Once | Status: AC
  Filled 2021-01-10: qty 1

## 2021-01-10 MED ORDER — ACETAMINOPHEN 160 MG/5ML PO SOLN: 650.0000 mg | ORAL | Status: DC | PRN

## 2021-01-10 MED ORDER — SODIUM CHLORIDE 0.9 % IV SOLN: INTRAVENOUS | Status: DC

## 2021-01-10 MED ORDER — SODIUM CHLORIDE 0.9 % IV BOLUS
500.0000 mL | Freq: Once | INTRAVENOUS | Status: AC
  Administered 2021-01-10: 500 mL via INTRAVENOUS

## 2021-01-10 MED ORDER — SODIUM CHLORIDE 0.9% FLUSH
3.0000 mL | Freq: Once | INTRAVENOUS | Status: AC
  Administered 2021-01-10: 3 mL via INTRAVENOUS

## 2021-01-10 MED ORDER — ASPIRIN 81 MG PO CHEW
81.0000 mg | CHEWABLE_TABLET | Freq: Every day | ORAL | Status: DC
  Administered 2021-01-11 – 2021-01-12 (×2): 81 mg via ORAL
  Filled 2021-01-10 (×2): qty 1

## 2021-01-10 MED ORDER — ENOXAPARIN SODIUM 40 MG/0.4ML IJ SOSY
40.0000 mg | PREFILLED_SYRINGE | INTRAMUSCULAR | Status: DC
  Administered 2021-01-10 – 2021-01-12 (×3): 40 mg via SUBCUTANEOUS
  Filled 2021-01-10 (×2): qty 0.4

## 2021-01-10 MED ORDER — HYDRALAZINE HCL 20 MG/ML IJ SOLN: 5.0000 mg | Freq: Four times a day (QID) | INTRAMUSCULAR | Status: DC | PRN

## 2021-01-10 MED ORDER — SENNOSIDES-DOCUSATE SODIUM 8.6-50 MG PO TABS: 1.0000 | ORAL_TABLET | Freq: Every evening | ORAL | Status: DC | PRN

## 2021-01-10 MED ORDER — ASPIRIN 300 MG RE SUPP: 300.0000 mg | Freq: Once | RECTAL | Status: DC

## 2021-01-10 MED ORDER — IOHEXOL 350 MG/ML SOLN
75.0000 mL | Freq: Once | INTRAVENOUS | Status: AC | PRN
  Administered 2021-01-10: 75 mL via INTRAVENOUS

## 2021-01-10 NOTE — Consult Note (Addendum)
Neurology Consultation  Reason for Consult: Left-sided weakness with MRI evidence of acute right thalamocapsular junction infarct  Referring Physician: Dr. Artis Flock  CC: Falling towards the left while walking, speech disturbance, weakness all over  History is obtained from: Chart review, Patient   HPI: Christopher [name] is a 55 y.o. male with a medical history significant for tobacco use and cocaine abuse with last use approximately 3 days ago who presented to the ED for evaluation of a 1 day history of slurred speech and falling towards the left with ambulation. He states that when he woke up on the morning of 01/09/2021, he noticed that he was imbalanced and falling towards the left when he attempted to walk and states that he just felt weak all over. He states that his speech quality is also not at his baseline which concerns him. He was at work when he told his boss that he could not walk and felt weak; his boss insisted that he come to the ED for evaluation. He does endorse smoking crack cocaine 1-2 days prior to hospital arrival.   LKW: 01/09/2021 at 0100 TNK given?: no, patient is well outside of the thrombolytic therapy time window IR Thrombectomy? No, presentation is not consistent with LVO, no evidence of LVO on MRI imaging Modified Rankin Scale: 0-Completely asymptomatic and back to baseline post- stroke  ROS: A complete ROS was performed and is negative except as noted in the HPI.   No past medical history on file.  Family History: Father- hypertension, CVA x 2, type 2 diabetes mellitus Mother- lung cancer Maternal grandmother- type 2 diabetes mellitus Sister- Hypertension   Social History:   reports that he has been smoking cigarettes. He has been smoking an average of .5 packs per day. He has quit using smokeless tobacco. He reports current alcohol use. He reports current drug use. Drug: "Crack" cocaine.  Medications  Current Facility-Administered Medications:     stroke:  mapping our early stages of recovery book, , Does not apply, Once, Orland Mustard, MD   0.9 %  sodium chloride infusion, , Intravenous, Continuous, Orland Mustard, MD, Last Rate: 75 mL/hr at 01/10/21 1413, New Bag at 01/10/21 1413   acetaminophen (TYLENOL) tablet 650 mg, 650 mg, Oral, Q4H PRN **OR** acetaminophen (TYLENOL) 160 MG/5ML solution 650 mg, 650 mg, Per Tube, Q4H PRN **OR** acetaminophen (TYLENOL) suppository 650 mg, 650 mg, Rectal, Q4H PRN, Orland Mustard, MD   Melene Muller ON 01/11/2021] aspirin chewable tablet 81 mg, 81 mg, Oral, Daily, Orland Mustard, MD   aspirin suppository 300 mg, 300 mg, Rectal, Once **OR** aspirin tablet 325 mg, 325 mg, Oral, Once, Orland Mustard, MD   enoxaparin (LOVENOX) injection 40 mg, 40 mg, Subcutaneous, Q24H, Orland Mustard, MD, 40 mg at 01/10/21 1402   hydrALAZINE (APRESOLINE) injection 5 mg, 5 mg, Intravenous, Q6H PRN, Orland Mustard, MD   senna-docusate (Senokot-S) tablet 1 tablet, 1 tablet, Oral, QHS PRN, Orland Mustard, MD  Exam: Current vital signs: BP (!) 195/95 (BP Location: Left Arm)   Pulse 66   Temp 98.7 F (37.1 C) (Oral)   Resp 16   SpO2 98%  Vital signs in last 24 hours: Temp:  [98.1 F (36.7 C)-98.7 F (37.1 C)] 98.7 F (37.1 C) (09/05 1513) Pulse Rate:  [62-90] 66 (09/05 1513) Resp:  [15-20] 16 (09/05 1513) BP: (161-195)/(68-105) 195/95 (09/05 1513) SpO2:  [91 %-100 %] 98 % (09/05 1513)  GENERAL: Disheveled appearing male laying comfortably in hospital bed. Awake, alert, in no acute distress.  Psych: Affect appropriate for situation, patient is calm and cooperative with examination Head: Normocephalic and atraumatic, without obvious abnormality EENT: Normal conjunctivae, dry mucous membranes, poor dentition noted. LUNGS: Normal respiratory effort. Non-labored breathing on room air. CV: Extremities well perfused without pedal edema ABDOMEN: Soft, non-tender, non-distended Extremities: warm, without obvious deformity  NEURO:  Mental  Status: Awake, alert, and oriented to person, place, time, and situation. He is able to provide a clear and coherent history of present illness. Speech/Language: speech is intact with mild dysarthria possibly 2/2 poor dentition.  Naming, repetition, fluency, and comprehension intact without aphasia. No neglect is noted. Cranial Nerves:  II: PERRL 4 mm/brisk. Visual fields full.  III, IV, VI: EOMI without ptosis or gaze preference V: Sensation is intact to light touch and symmetrical to face. VII: Face is symmetric resting and smiling.  VIII: Hearing is intact to voice IX, X: Palate elevation is symmetric. Phonation normal.  XI: Normal sternocleidomastoid and trapezius muscle strength XII: Tongue protrudes midline without fasciculations.   Motor: Left upper extremity 4+/5 strength with pronator drift with distraction noted. Right upper extremity 5/5 strength without vertical drift.  Right lower extremity 5/5 strength Left lower extremity 5/5 knee flexion/extension, 4+/5 at the hip with subtle weakness compared to the RLE. LLE without vertical drift on assessment.  Tone is normal. Bulk is normal.  Sensation: Intact to light touch bilaterally in all four extremities. No extinction to DSS present.  Coordination: Mild dysmetria noted with HKS on the left lower extremity. FNF intact bilaterally.  DTRs: 2+ and symmetric patellae and biceps Gait: Deferred  NIHSS: 1a Level of Conscious.: 0 1b LOC Questions: 0 1c LOC Commands: 0 2 Best Gaze: 0 3 Visual: 0 4 Facial Palsy: 0 5a Motor Arm - left: 1 5b Motor Arm - Right: 0 6a Motor Leg - Left: 0 6b Motor Leg - Right: 0 7 Limb Ataxia: 1 8 Sensory: 0 9 Best Language: 0 10 Dysarthria: 1 11 Extinct. and Inatten.: 0 TOTAL: 3  Labs I have reviewed labs in epic and the results pertinent to this consultation are: CBC    Component Value Date/Time   WBC 14.1 (H) 01/10/2021 1035   RBC 5.34 01/10/2021 1035   HGB 15.7 01/10/2021 1035   HCT 48.5  01/10/2021 1035   PLT 319 01/10/2021 1035   MCV 90.8 01/10/2021 1035   MCH 29.4 01/10/2021 1035   MCHC 32.4 01/10/2021 1035   RDW 12.0 01/10/2021 1035   LYMPHSABS 2.0 01/10/2021 1035   MONOABS 0.9 01/10/2021 1035   EOSABS 0.1 01/10/2021 1035   BASOSABS 0.1 01/10/2021 1035  CMP     Component Value Date/Time   NA 137 01/10/2021 1035   K 4.5 01/10/2021 1035   CL 103 01/10/2021 1035   CO2 27 01/10/2021 1035   GLUCOSE 114 (H) 01/10/2021 1035   BUN 12 01/10/2021 1035   CREATININE 1.28 (H) 01/10/2021 1035   CALCIUM 9.2 01/10/2021 1035   PROT 7.0 01/10/2021 1035   ALBUMIN 3.8 01/10/2021 1035   AST 23 01/10/2021 1035   ALT 18 01/10/2021 1035   ALKPHOS 58 01/10/2021 1035   BILITOT 0.8 01/10/2021 1035   GFRNONAA >60 01/10/2021 1035   GFRAA >90 11/15/2012 1044   Lipid Panel     Component Value Date/Time   CHOL  10/16/2006 0530    89        ATP III CLASSIFICATION:  <200     mg/dL   Desirable  983-382  mg/dL  Borderline High  >=240    mg/dL   High   TRIG 71 16/10/960406/04/2007 0525   No results found for: HGBA1C  Drugs of Abuse     Component Value Date/Time   LABOPIA NONE DETECTED 01/10/2021 1400   COCAINSCRNUR POSITIVE (A) 01/10/2021 1400   LABBENZ NONE DETECTED 01/10/2021 1400   AMPHETMU NONE DETECTED 01/10/2021 1400   THCU POSITIVE (A) 01/10/2021 1400   LABBARB NONE DETECTED 01/10/2021 1400    Imaging I have reviewed the images obtained:  CT-scan of the brain 01/10/2021: 1. Age indeterminate lacunar infarct in the lateral left thalamus. 2. Lacunar infarcts involving the right caudate head and right putamen appear more remote. 3. Areas of cortical hypoattenuation at the exophytic poles, left greater than right. These may represent acute or subacute infarcts. MRI could be used for further evaluation if clinically indicated. 4. Atrophy and white matter disease likely reflects the sequela of chronic microvascular ischemia.  MRI examination of the brain 01/10/2021: - 11 mm acute  lacunar infarct within the right thalamocapsular junction. - 5 mm probable subacute infarct within the subcortical white matter of the mid-to-anterior right frontal lobe. - Chronic lacunar infarcts within the bilateral basal ganglia and left thalamus. - Background mild-to-moderate chronic small vessel ischemic disease within the cerebral white matter and pons. - Mild generalized parenchymal atrophy. - Minimal paranasal sinus mucosal thickening, as described.  Assessment: 55 y.o. male who presented for evaluation of 1+ day of speech disturbance, left-sided weakness, and leftward lean with ambulation. MRI brain reveals an acute lacunar infarct within the right thalamocapsular junction.  - Exam reveals patient with left upper extremity weakness, subtle left lower extremity weakness with mild ataxia, and mild dysarthria. The patient also reports a change in speech quality.  - Patient was not a candidate for thrombolytic therapy due to being outside of the treatment window on presentation. He is not a candidate for IR due to presentation not consistent with LVO and last known well > 24 hours prior to presentation.  - Stroke risk factors include tobacco use, cocaine abuse, and likely longstanding history of hypertension with significant hypertension on arrival. Also, patient with history of strokes identified on MRI imaging, unknown to patient.  - Possible etiology for stroke includes recent cocaine abuse. Also possible but less likely would be small vessel disease in the setting of chronic hypertension. A combination of the two above factors is felt to be most likely. Will complete further stroke work up with vessel imaging.   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   Lanae BoastStevi Toberman, AGAC-NP [facility] Pager: [phone]  I have seen and examined the patient. I have formulated the assessment and recommendations. 55 y.o. male who presented for evaluation of 1+ day of speech disturbance, left-sided weakness, and leftward lean with ambulation. MRI brain reveals an acute lacunar infarct within the right thalamocapsular junction. Exam reveals patient with left upper extremity weakness, subtle left lower extremity weakness with mild ataxia, and mild dysarthria. The patient also reports a change  in speech quality. Recommendations for stroke work up as above.  Electronically signed: Dr. Caryl Pina

## 2021-01-10 NOTE — Progress Notes (Signed)
Patient ID: Christopher [name], male   DOB: [birth year], 55 y.o.   MRN: [id number]   Got called by nurse for bp of 195 and PVC. Patient completely asymptomatic. Walking with PT. Discussed allowing for permissive HTN in setting of acute CVA. Have ordered hydralazine for SBP >220 due to + cocaine on UDS.   Dr. Artis Flock  [facility]

## 2021-01-10 NOTE — ED Notes (Addendum)
Pt to MRI. All belongings sent with pt.

## 2021-01-10 NOTE — H&P (Addendum)
History and Physical    Christopher [name] YYT:[id number] DOB: [birth year] DOA: 01/10/2021  PCP: Patient, No Pcp Per (Inactive) Consultants:  none Patient coming from:  Home - lives with a roommate   Chief Complaint: overall weakness, right sided weakness and slurred speech   HPI: Christopher [name] is a 55 y.o. male with medical history significant tobacco abuse (1 PPD) and hx of cocaine abuse who last used about 3 days ago, but states he uses daily who presented with 1+ day history of slurred speech and right sided weakness.  He woke up yesterday AM and his speech was slurred and when he got up he was off balance and didn't have any strength, but more noticeable on the right side. He feels like he is walking all over the place when he tries to walk. He states he feels weak all over. His brother came and got him to do some work and he told him he couldn't do any work because he couldn't stand up. His boss didn't like this so they brought him to ER. He has never had anything like this happen to him before. He states his speech is still slurred, but the weakness seems to have improved.   He denies any facial drooping, loss of sensation, aphasia. He denies any fever/chills, coughing, shortness of breath, chest pain, palpitations, stomach pain, N/V/D, leg swelling, rashes.   Family history of strokes in his father. He had 2 strokes.    LKW: 0100 on 01/08/21.    ED Course: Vitals: Afebrile, blood pressure 162/100, heart rate 89, respiratory rate 17, oxygen 98% on room air Pertinent labs: WBC 14.1, creatinine 1.28, UDS pending, CT head: Age-indeterminate lacunar infarct in the lateral left thalamus, lacunar infarcts involving the right caudate head and right putamen appear more remote, areas of cortical hypoattenuation at the exophytic poles, left greater than right may represent acute or subacute infarcts.  We were asked to admit for likely acute stroke.   Review of Systems: As per HPI; otherwise review of  systems reviewed and negative.   Ambulatory Status:  Ambulates without assistance   No past medical history on file.  No past surgical history on file.  Social History   Socioeconomic History   Marital status: Divorced    Spouse name: Not on file   Number of children: Not on file   Years of education: Not on file   Highest education level: Not on file  Occupational History   Not on file  Tobacco Use   Smoking status: Every Day    Packs/day: 0.50    Types: Cigarettes   Smokeless tobacco: Former  Substance and Sexual Activity   Alcohol use: Yes    Comment: 2-3 "sparks" a day   Drug use: Yes    Types: "Crack" cocaine    Comment: "only every once in awhile"   Sexual activity: Not on file  Other Topics Concern   Not on file  Social History Narrative   Not on file   Social Determinants of Health   Financial Resource Strain: Not on file  Food Insecurity: Not on file  Transportation Needs: Not on file  Physical Activity: Not on file  Stress: Not on file  Social Connections: Not on file  Intimate Partner Violence: Not on file    No Known Allergies  No family history on file.  Prior to Admission medications   Medication Sig Start Date End Date Taking? Authorizing Provider  mupirocin cream (BACTROBAN) 2 % Apply  topically 3 (three) times daily. 12/02/12   Rodolph Bong, MD    Physical Exam: Vitals:   01/10/21 1022 01/10/21 1036 01/10/21 1100  BP: (!) 162/100 (!) 180/91 (!) 171/104  Pulse: 89 70 62  Resp: 17 20 19   Temp: 98.1 F (36.7 C)    TempSrc: Oral    SpO2: 98% 98% 100%     General:  Appears calm and comfortable and is in NAD. Grease over hands, feet. Dirty appearing  Eyes:  PERRL, EOMI, normal lids, iris ENT:  grossly normal hearing, lips & tongue, dry mucous membranes.; poor dentition Neck:  no LAD, masses or thyromegaly; no carotid bruits Cardiovascular:  RRR, no m/r/g. No LE edema.  Respiratory:   CTA bilaterally with no wheezes/rales/rhonchi.   Normal respiratory effort. Abdomen:  soft, NT, ND, NABS Back:   normal alignment, no CVAT Skin:  no rash or induration seen on limited exam Musculoskeletal:  grossly normal tone BUE/BLE, strength 5/5 in bilateral UE and LE. good ROM, no bony abnormality Lower extremity:  No LE edema.  Limited foot exam with no ulcerations.  2+ distal pulses. Psychiatric:  grossly normal mood and affect, speech fluent and appropriate, AOx3 Neurologic:  CN 2-12 grossly intact, moves all extremities in coordinated fashion, sensation intact. Intact heel to shin bilaterally, intact finger to nose. No pronator drift gait deferred.    Radiological Exams on Admission: Independently reviewed - see discussion in A/P where applicable  CT HEAD WO CONTRAST  Result Date: 01/10/2021 CLINICAL DATA:  Right upper extremity weakness.  Unsteady gait. EXAM: CT HEAD WITHOUT CONTRAST TECHNIQUE: Contiguous axial images were obtained from the base of the skull through the vertex without intravenous contrast. COMPARISON:  None. FINDINGS: Brain: Age indeterminate lacunar infarct present in the lateral left thalamus. Lacunar infarcts involving the right caudate head and right putamen appear more remote. Periventricular white matter changes noted bilaterally. Additional subcortical white matter hypoattenuation scattered bilaterally. Areas of cortical hypoattenuation noted at the exophytic poles, left greater than right. No other focal cortical abnormality is present. Brainstem and cerebellum are normal. No acute hemorrhage or mass lesion is present. The ventricles are of normal size. No significant extraaxial fluid collection is present. Vascular: No hyperdense vessel or unexpected calcification. Skull: Calvarium is intact. No focal lytic or blastic lesions are present. No significant extracranial soft tissue lesion is present. Sinuses/Orbits: The paranasal sinuses and mastoid air cells are clear. The globes and orbits are within normal limits.  IMPRESSION: 1. Age indeterminate lacunar infarct in the lateral left thalamus. 2. Lacunar infarcts involving the right caudate head and right putamen appear more remote. 3. Areas of cortical hypoattenuation at the exophytic poles, left greater than right. These may represent acute or subacute infarcts. MRI could be used for further evaluation if clinically indicated. 4. Atrophy and white matter disease likely reflects the sequela of chronic microvascular ischemia. Electronically Signed   By: 03/12/2021 M.D.   On: 01/10/2021 11:24    EKG: Independently reviewed.  NSR with rate 69; nonspecific ST changes with no evidence of acute ischemia No prior tracings.   Labs on Admission: I have personally reviewed the available labs and imaging studies at the time of the admission.  Pertinent labs:  WBC: 14.1 Creatinine 1.28 (baseline of .67-1.0)   Assessment/Plan Active Problems:   Right sided weakness and slurred speech  55 year old presenting with slurred speech and right-sided weakness with multiple risk factors for stroke including tobacco abuse, cocaine abuse and likely longstanding untreated hypertension. -  CT head with age-indeterminate lacunar infarct in the left lateral thalamus as well as areas of cortical hypoattenuation of the exophytic poles that may represent acute or subacute infarcts. -We will put him in observation on telemetry -frequent neuro checks  -MRI brain without contrast has been ordered -Aspirin 325 mg x 1 and then we will start him on 81 mg daily -A1c and lipid panel pending -echo has been ordered in light of longstanding untreated hypertension and drug abuse -N.p.o. until MRI is back to confirm acute stroke and then will need bedside swallow screen -PT/OT/ST has also been ordered due to slurred speech and weakness. -We will allow permissive hypertension for 24 to 48 hours and will likely need treatment initiated tomorrow -Consult neurology if MRI shows acute  stroke    AKI (acute kidney injury) vs. Baseline  -baseline of .67-1.0; however, this was from 8 years ago.  -appears dry on exam  -small fluid bolus and gentle IVF x 12 hours -avoid nephrotoxic drugs and repeat labs in AM     Leukocytosis -likely reactive, no signs of infection.  IVF and repeat in AM.   Elevated blood pressure No official diagnosis, but likely has longstanding untreated HTN Will allow for permissive hypertension until acute stroke is ruled out and start medication tomorrow if indicated.    Cocaine abuse (HCC) -UDS pending, avoid beta blockers. Uses daily -does want to stop, SW consult placed     Tobacco abuse  -declines nicotine patch -SW consult for help/resources for tobacco and cocaine abuse  There is no height or weight on file to calculate BMI.   Level of care: Telemetry Medical DVT prophylaxis:  Lovenox  Code Status:  Full - confirmed with patient Family Communication: None present Disposition Plan:  The patient is from: home  Anticipated d/c is to: home  Requires inpatient hospitalization and is at significant risk of neurological worsening, requires constant monitoring, assessment and MDM with specialists.  Consults called:   Admission status:  observation   Dragon dictation used in completing this note.   Orland Mustard MD [facility]   How to contact the [facility] Attending or Consulting provider 7A - 7P or covering provider during after hours 7P -7A, for this patient?  Check the care team in [facility] and look for a) attending/consulting [facility] provider listed and b) the [facility] team listed Log into [email or web address] and use [place]'s universal password to access. If you do not have the password, please contact the hospital operator. Locate the [facility] provider you are looking for under [facility] and page to a number that you can be directly reached. If you still have difficulty reaching the provider, please page the [facility] (Director on Call) for the  Hospitalists listed on amion for assistance.   01/10/2021, 1:08 PM

## 2021-01-10 NOTE — ED Notes (Signed)
Pt reports waking up yesterday feeling weaker to the right side with slurred speech and difficulty walking. Alert and oriented. Denies hx of stroke. Pt initial NIHS is 1 due to left arm ataxia. Able to stand up and walk a few steps on a steady gait. Pt reports his speech right now is different from his usually speech. No slurred speech observed at this time. Cardiac monitoring in place. Will continue to monitor.

## 2021-01-10 NOTE — Progress Notes (Signed)
CCMD call to report irregular rhythm, BP 195/95 notified Wolfe,Allison MD. See Chart for new orders

## 2021-01-10 NOTE — Evaluation (Signed)
Occupational Therapy Evaluation Patient Details Name: Christopher [name] MRN: [id number] DOB: 26-Jul-1965 Today's Date: 01/10/2021    History of Present Illness 55 y.o. male with medical history significant tobacco abuse (1 PPD) and hx of cocaine abuse.  Presented with 1+ day history of slurred speech and right sided weakness.  Patient now presenting with L sided weakness - with OT eval 9/5.   Clinical Impression   Patient admitted for the above diagnosis.  He originally reported R residual weakness and slurred speech on admission.  He remarks that his speech is improving, although it is still slurred.  Of note: now it appears his left arm and left leg is weaker than his right side.  MD and RN alerted, neuro has been consulted.  He is very unsteady, needing min a for basic in room mobility/toileting.  He is not interested in post acute rehab, but would certainly benefit from continued OT in the acute setting, and at least [facility] PT post acute.  Decreased awareness/concern to deficits.      Follow Up Recommendations  Other (comment) (HH PT)    Equipment Recommendations  3 in 1 bedside commode    Recommendations for Other Services       Precautions / Restrictions Precautions Precautions: Fall Precaution Comments: very unsteady Restrictions Weight Bearing Restrictions: No      Mobility Bed Mobility Overal bed mobility: Needs Assistance Bed Mobility: Supine to Sit;Sit to Supine     Supine to sit: Supervision Sit to supine: Supervision     Patient Response: Cooperative  Transfers Overall transfer level: Needs assistance Equipment used: 1 person hand held assist Transfers: Sit to/from [employer] Sit to Stand: Min assist Stand pivot transfers: Min assist       General transfer comment: LOB to the left - unsteady.    Balance Overall balance assessment: Needs assistance Sitting-balance support: Feet supported Sitting balance-Leahy Scale: Fair   Postural  control: Posterior lean Standing balance support: Single extremity supported Standing balance-Leahy Scale: Poor Standing balance comment: holding onto IV pole, but still needing balance assist and drifting to the left.                           ADL either performed or assessed with clinical judgement   ADL Overall ADL's : Needs assistance/impaired     Grooming: Wash/dry hands;Minimal assistance;Standing           Upper Body Dressing : Supervision/safety;Sitting   Lower Body Dressing: Minimal assistance;Sitting/lateral leans               Functional mobility during ADLs: Minimal assistance General ADL Comments: HHA     Vision Baseline Vision/History: 1 Wears glasses Patient Visual Report: No change from baseline       Perception  WFL   Praxis  Intact    Pertinent Vitals/Pain Pain Assessment: No/denies pain     Hand Dominance Left   Extremity/Trunk Assessment Upper Extremity Assessment Upper Extremity Assessment: LUE deficits/detail LUE Deficits / Details: grossly 3+/5 LUE Sensation: WNL LUE Coordination: WNL       Cervical / Trunk Assessment Cervical / Trunk Assessment: Normal   Communication Communication Communication: Expressive difficulties   Cognition Arousal/Alertness: Awake/alert Behavior During Therapy: WFL for tasks assessed/performed Overall Cognitive Status: Impaired/Different from baseline Area of Impairment: Safety/judgement                         Safety/Judgement: Decreased awareness  of safety;Decreased awareness of deficits     General Comments: following commands well, no confusion noted, oriented x 4.   General Comments       Exercises     Shoulder Instructions      Home Living Family/patient expects to be discharged to:: Private residence Living Arrangements: Non-relatives/Friends Available Help at Discharge: Available PRN/intermittently Type of Home: House Home Access: Stairs to enter [employer] of Steps: 2   Home Layout: One level     Bathroom Shower/Tub: Chief Strategy Officer: Standard     Home Equipment: None          Prior Functioning/Environment Level of Independence: Independent        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  No assist with ADL/IADL or mobility.        OT Problem List: Decreased strength;Impaired balance (sitting and/or standing);Decreased safety awareness;Impaired UE functional use      OT Treatment/Interventions: Self-care/ADL training;Therapeutic exercise;Balance training;Patient/family education;Therapeutic activities    OT Goals(Current goals can be found in the care plan section) Acute Rehab OT Goals Patient Stated Goal: I want to go home OT Goal Formulation: With patient Time For Goal Achievement: 01/24/21 Potential to Achieve Goals: Fair  OT Frequency: Min 2X/week   Barriers to D/C: Decreased caregiver support          Co-evaluation              AM-PAC OT "6 Clicks" Daily Activity     Outcome Measure Help from another person eating meals?: None Help from another person taking care of personal grooming?: A Little Help from another person toileting, which includes using toliet, bedpan, or urinal?: A Little Help from another person bathing (including washing, rinsing, drying)?: A Little Help from another person to put on and taking off regular upper body clothing?: None Help from another person to put on and taking off regular lower body clothing?: A Little 6 Click Score: 20   End of Session Equipment Utilized During Treatment: Gait belt  Activity Tolerance: Patient tolerated treatment well Patient left: in bed;with call bell/phone within reach;with bed alarm set  OT Visit Diagnosis: Unsteadiness on feet (R26.81);Muscle weakness (generalized) (M62.81);Hemiplegia and hemiparesis Hemiplegia - Right/Left: Left Hemiplegia - dominant/non-dominant: Dominant Hemiplegia - caused  by: Unspecified                Time: 1643-1700 OT Time Calculation (min): 17 min Charges:  OT General Charges $OT Visit: 1 Visit OT Evaluation $OT Eval Moderate Complexity: 1 Mod  01/10/2021  RP, OTR/L  Acute Rehabilitation Services  Office:  [phone]   Suzanna Obey 01/10/2021, 5:16 PM

## 2021-01-10 NOTE — Progress Notes (Signed)
  Echocardiogram 2D Echocardiogram has been performed.  Christopher [name] 01/10/2021, 4:38 PM

## 2021-01-10 NOTE — ED Provider Notes (Signed)
[facility] EMERGENCY DEPARTMENT Provider Note   CSN: [id number] Arrival date & time: 01/10/21  1018     History No chief complaint on file.   Christopher [name] is a 55 y.o. male.  The history is provided by the patient.  Dizziness Quality:  Imbalance Severity:  Moderate Onset quality:  Sudden Duration:  1 day (Awoke yesterday am with symptoms. Last known well was about 0100 on 01/08/21) Timing:  Constant Progression:  Unchanged Chronicity:  New Context comment:  No known cause Relieved by:  Nothing Exacerbated by: walking. Ineffective treatments:  None tried Associated symptoms: weakness (right upper and lower extremities)   Associated symptoms: no chest pain, no headaches, no nausea, no palpitations, no shortness of breath, no vision changes and no vomiting   Risk factors comment:  No known medical problems but does not seek medical care     No past medical history on file.  Patient Active Problem List   Diagnosis Date Noted   Cellulitis and abscess of upper arm and forearm 11/13/2012   Episodic tobacco abuse 11/13/2012    No past surgical history on file.     No family history on file.  Social History   Tobacco Use   Smoking status: Every Day    Packs/day: 0.50    Types: Cigarettes   Smokeless tobacco: Former  Substance Use Topics   Alcohol use: Yes    Comment: 2-3 "sparks" a day   Drug use: Yes    Types: "Crack" cocaine    Comment: "only every once in awhile"    Home Medications Prior to Admission medications   Medication Sig Start Date End Date Taking? Authorizing Provider  mupirocin cream (BACTROBAN) 2 % Apply topically 3 (three) times daily. 12/02/12   Rodolph Bong, MD    Allergies    Patient has no known allergies.  Review of Systems   Review of Systems  Constitutional:  Negative for chills and fever.  HENT:  Negative for ear pain and sore throat.   Eyes:  Negative for pain and visual disturbance.  Respiratory:   Negative for cough and shortness of breath.   Cardiovascular:  Negative for chest pain and palpitations.  Gastrointestinal:  Negative for abdominal pain, nausea and vomiting.  Genitourinary:  Negative for dysuria and hematuria.  Musculoskeletal:  Negative for arthralgias and back pain.  Skin:  Negative for color change and rash.  Neurological:  Positive for dizziness, speech difficulty and weakness (right upper and lower extremities). Negative for seizures, syncope and headaches.  All other systems reviewed and are negative.  Physical Exam Updated Vital Signs BP (!) 180/91   Pulse 70   Temp 98.1 F (36.7 C) (Oral)   Resp 20   SpO2 98%   Physical Exam Vitals and nursing note reviewed.  Constitutional:      Appearance: Normal appearance. He is underweight.  HENT:     Head: Normocephalic and atraumatic.  Eyes:     General: No visual field deficit. Cardiovascular:     Rate and Rhythm: Normal rate and regular rhythm.     Heart sounds: Normal heart sounds.  Pulmonary:     Effort: Pulmonary effort is normal.     Breath sounds: Normal breath sounds.  Abdominal:     General: There is no distension.     Tenderness: There is no abdominal tenderness. There is no guarding.  Musculoskeletal:     Cervical back: Normal range of motion.     Right  lower leg: No edema.     Left lower leg: No edema.  Skin:    General: Skin is warm and dry.     Comments: Hands are completely black from motor grease  Neurological:     General: No focal deficit present.     Mental Status: He is alert and oriented to person, place, and time.     Cranial Nerves: No cranial nerve deficit, dysarthria or facial asymmetry.     Sensory: Sensation is intact.     Coordination: Heel to Shin Test normal.     Gait: Gait is intact.     Comments: Possibly very mild grip strength weakness on right vs left, but no drift was noted in this extremity  Mild left-sided dysmetria  No ataxia  Psychiatric:        Mood and  Affect: Mood normal.        Behavior: Behavior normal.    ED Results / Procedures / Treatments   Labs (all labs ordered are listed, but only abnormal results are displayed) Labs Reviewed  CBC - Abnormal; Notable for the following components:      Result Value   WBC 14.1 (*)    All other components within normal limits  DIFFERENTIAL - Abnormal; Notable for the following components:   Neutro Abs 11.0 (*)    All other components within normal limits  COMPREHENSIVE METABOLIC PANEL - Abnormal; Notable for the following components:   Glucose, Bld 114 (*)    Creatinine, Ser 1.28 (*)    All other components within normal limits  SARS CORONAVIRUS 2 (TAT 6-24 HRS)  PROTIME-INR  APTT  RAPID URINE DRUG SCREEN, HOSP PERFORMED  CBG MONITORING, ED  CBG MONITORING, ED    EKG EKG Interpretation  Date/Time:  Monday January 10 2021 10:34:16 EDT Ventricular Rate:  69 PR Interval:  150 QRS Duration: 93 QT Interval:  438 QTC Calculation: 470 R Axis:   80 Text Interpretation: Sinus rhythm Atrial premature complex Left atrial enlargement Anterior infarct, old Confirmed by Pieter Partridge ([id number]) on 01/10/2021 10:36:18 AM  Radiology CT HEAD WO CONTRAST  Result Date: 01/10/2021 CLINICAL DATA:  Right upper extremity weakness.  Unsteady gait. EXAM: CT HEAD WITHOUT CONTRAST TECHNIQUE: Contiguous axial images were obtained from the base of the skull through the vertex without intravenous contrast. COMPARISON:  None. FINDINGS: Brain: Age indeterminate lacunar infarct present in the lateral left thalamus. Lacunar infarcts involving the right caudate head and right putamen appear more remote. Periventricular white matter changes noted bilaterally. Additional subcortical white matter hypoattenuation scattered bilaterally. Areas of cortical hypoattenuation noted at the exophytic poles, left greater than right. No other focal cortical abnormality is present. Brainstem and cerebellum are normal. No acute hemorrhage  or mass lesion is present. The ventricles are of normal size. No significant extraaxial fluid collection is present. Vascular: No hyperdense vessel or unexpected calcification. Skull: Calvarium is intact. No focal lytic or blastic lesions are present. No significant extracranial soft tissue lesion is present. Sinuses/Orbits: The paranasal sinuses and mastoid air cells are clear. The globes and orbits are within normal limits. IMPRESSION: 1. Age indeterminate lacunar infarct in the lateral left thalamus. 2. Lacunar infarcts involving the right caudate head and right putamen appear more remote. 3. Areas of cortical hypoattenuation at the exophytic poles, left greater than right. These may represent acute or subacute infarcts. MRI could be used for further evaluation if clinically indicated. 4. Atrophy and white matter disease likely reflects the sequela of chronic microvascular  ischemia. Electronically Signed   By: Marin Roberts M.D.   On: 01/10/2021 11:24    Procedures Procedures   Medications Ordered in ED Medications  sodium chloride flush (NS) 0.9 % injection 3 mL (3 mLs Intravenous Given 01/10/21 1037)    ED Course  I have reviewed the triage vital signs and the nursing notes.  Pertinent labs & imaging results that were available during my care of the patient were reviewed by me and considered in my medical decision making (see chart for details).  Clinical Course as of 01/10/21 1223  Mon Jan 10, 2021  1222 I spoke with Dr. Artis Flock regarding admission. [AW]    Clinical Course User Index [AW] Koleen Distance, MD   MDM Rules/Calculators/A&P                           Cristela Felt scented with greater than 24 hours of strokelike symptoms.  He was noted to be quite hypertensive.  He was outside of the window for code stroke, and he was evaluated for evidence of acute neurologic pathology.  CT scan shows numerous lacunar infarcts.  He will be admitted for further evaluation and secondary  prevention measures. Final Clinical Impression(s) / ED Diagnoses Final diagnoses:  Cerebrovascular accident (CVA), unspecified mechanism (HCC)  Tobacco use  Hypertension, unspecified type    Rx / DC Orders ED Discharge Orders     None        Koleen Distance, MD 01/10/21 1225

## 2021-01-10 NOTE — ED Triage Notes (Signed)
Pt reports waking up yesterday around 0800 or 0900 with symptoms of slurred speech, feeling off balance, and difficulty holding a spoon in R hand. LSN 0100 on 9/4.

## 2021-01-11 ENCOUNTER — Encounter (HOSPITAL_COMMUNITY): Payer: Self-pay | Admitting: Family Medicine

## 2021-01-11 DIAGNOSIS — I639 Cerebral infarction, unspecified: Secondary | ICD-10-CM | POA: Diagnosis not present

## 2021-01-11 DIAGNOSIS — I6330 Cerebral infarction due to thrombosis of unspecified cerebral artery: Secondary | ICD-10-CM | POA: Diagnosis not present

## 2021-01-11 DIAGNOSIS — F1410 Cocaine abuse, uncomplicated: Secondary | ICD-10-CM | POA: Diagnosis not present

## 2021-01-11 DIAGNOSIS — I10 Essential (primary) hypertension: Secondary | ICD-10-CM

## 2021-01-11 DIAGNOSIS — D72829 Elevated white blood cell count, unspecified: Secondary | ICD-10-CM | POA: Diagnosis not present

## 2021-01-11 LAB — CBC
HCT: 42.9 % (ref 39.0–52.0)
Hemoglobin: 14.4 g/dL (ref 13.0–17.0)
MCH: 29.6 pg (ref 26.0–34.0)
MCHC: 33.6 g/dL (ref 30.0–36.0)
MCV: 88.1 fL (ref 80.0–100.0)
Platelets: 275 10*3/uL (ref 150–400)
RBC: 4.87 MIL/uL (ref 4.22–5.81)
RDW: 11.7 % (ref 11.5–15.5)
WBC: 10.9 10*3/uL — ABNORMAL HIGH (ref 4.0–10.5)
nRBC: 0 % (ref 0.0–0.2)

## 2021-01-11 LAB — BASIC METABOLIC PANEL
Anion gap: 8 (ref 5–15)
BUN: 12 mg/dL (ref 6–20)
CO2: 27 mmol/L (ref 22–32)
Calcium: 8.6 mg/dL — ABNORMAL LOW (ref 8.9–10.3)
Chloride: 99 mmol/L (ref 98–111)
Creatinine, Ser: 1.2 mg/dL (ref 0.61–1.24)
GFR, Estimated: >60 mL/min (ref >60)
Glucose, Bld: 114 mg/dL — ABNORMAL HIGH (ref 70–99)
Potassium: 3.6 mmol/L (ref 3.5–5.1)
Sodium: 134 mmol/L — ABNORMAL LOW (ref 135–145)

## 2021-01-11 LAB — LIPID PANEL
Cholesterol: 102 mg/dL (ref 0–200)
HDL: 25 mg/dL — ABNORMAL LOW (ref >40)
LDL Cholesterol: 60 mg/dL (ref 0–99)
Total CHOL/HDL Ratio: 4.1 RATIO
Triglycerides: 86 mg/dL (ref <150)
VLDL: 17 mg/dL (ref 0–40)

## 2021-01-11 LAB — MRSA NEXT GEN BY PCR, NASAL: MRSA by PCR Next Gen: NOT DETECTED

## 2021-01-11 LAB — HEMOGLOBIN A1C
Hgb A1c MFr Bld: 5.8 % — ABNORMAL HIGH (ref 4.8–5.6)
Mean Plasma Glucose: 119.76 mg/dL

## 2021-01-11 MED ORDER — CLOPIDOGREL BISULFATE 75 MG PO TABS
75.0000 mg | ORAL_TABLET | Freq: Every day | ORAL | Status: DC
  Administered 2021-01-11 – 2021-01-12 (×2): 75 mg via ORAL
  Filled 2021-01-11 (×2): qty 1

## 2021-01-11 MED ORDER — ATORVASTATIN CALCIUM 10 MG PO TABS
20.0000 mg | ORAL_TABLET | Freq: Every day | ORAL | Status: DC
  Administered 2021-01-11 – 2021-01-12 (×2): 20 mg via ORAL
  Filled 2021-01-11 (×2): qty 2

## 2021-01-11 NOTE — Progress Notes (Signed)
Inpatient Rehab Admissions Coordinator:   Per therapy recommendation,  patient was screened for CIR candidacy by Megan Salon, MS, CCC-SLP. At this time, Pt. Appears to demonstrate medical necessity, functional decline, and ability to tolerate intensity of CIR. Pt. is a potential candidate for CIR. I will place an   order for rehab consult per protocol for full assessment. Please contact me any with questions.Megan Salon, MS, CCC-SLP Rehab Admissions Coordinator  [phone] (celll) [phone] (office)

## 2021-01-11 NOTE — Evaluation (Signed)
Physical Therapy Evaluation Patient Details Name: Christopher [name] MRN: [id number] DOB: 04-23-1966 Today's Date: 01/11/2021   History of Present Illness  55 y.o. admitted 9/5 with R sided weakness and slurred speech. MRI 9/5 revealed acute lacunar infarct in thalamocapsular junction and chronic lacunar infarcts in the bilateral basal ganglia and left thamalus. Therapy evals with noted Left hemiparesis. PMH: tobacco and cocaine abuse, HTN  Clinical Impression  Pt tolerated treatment well with VSS on RA. Pt demonstrated greater weakness on L extremities compared to R during muscle testing. Pt ambulated in hall with 1 person HHA on R. Pt also demonstrated walking with RW, but showed greater steadiness with HHA. Pt's deficits include decreased balance, strength, ROM, and mobility. Pt would benefit from continued pt to improve deficits. Recommend CIR given pt's PLOF and motivation to improve.   HR 67-77 during activity    Follow Up Recommendations CIR    Equipment Recommendations  Other (comment) (hemiwalker; will update based on mobility and progression)    Recommendations for Other Services       Precautions / Restrictions Precautions Precautions: Fall Precaution Comments: left weakness Restrictions Weight Bearing Restrictions: No      Mobility  Bed Mobility Overal bed mobility: Modified Independent Bed Mobility: Supine to Sit     Supine to sit: Modified independent (Device/Increase time);HOB elevated     General bed mobility comments: 30 degrees HOB with assist from bedrails to scoot hips EOB    Transfers Overall transfer level: Needs assistance   Transfers: Sit to/from Stand Sit to Stand: Supervision         General transfer comment: pt able to rise from bed without assist. supervision for safety  Ambulation/Gait Ambulation/Gait assistance: Min assist Gait Distance (Feet): 100 Feet Assistive device: 1 person hand held assist;Rolling walker (2 wheeled) Gait  Pattern/deviations: Step-to pattern     General Gait Details: left lean with decreased stance, stepping and control of LLE, decreased control of LUE on RW. Pt initially with no UE support with noted left lean. Transitioned to RUE support for stepping with improved stabilty and stride with min assist to control balance. RW for grossly 15' as pt wanting to reach out with bil UE but unable to maintain grip or control of RW and transitioned back to HHA. PT fatigued limiting gait with chair follow  Stairs            Wheelchair Mobility    Modified Rankin (Stroke Patients Only)       Balance Overall balance assessment: Needs assistance Sitting-balance support: Feet supported;Single extremity supported Sitting balance-Leahy Scale: Fair Sitting balance - Comments: Intermittent RUE support on bedrails   Standing balance support: Single extremity supported;During functional activity Standing balance-Leahy Scale: Poor Standing balance comment: Requires external and RUE support for ambulation                             Pertinent Vitals/Pain Pain Assessment: No/denies pain    Home Living Family/patient expects to be discharged to:: Private residence Living Arrangements: Non-relatives/Friends Available Help at Discharge: Available PRN/intermittently Type of Home: House Home Access: Stairs to enter Entrance Stairs-Rails: None Entrance Stairs-Number of Steps: 2 Home Layout: One level Home Equipment: None      Prior Function Level of Independence: Independent         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     Hand Dominance   Dominant Hand: Left  Extremity/Trunk Assessment   Upper Extremity Assessment Upper Extremity Assessment: LUE deficits/detail LUE Deficits / Details: 3/5 elbow flexion, 3/5 grip strength, 3/5 shoulder flexion    Lower Extremity Assessment Lower Extremity Assessment: LLE deficits/detail;RLE deficits/detail RLE  Deficits / Details: 5/5 knee extension/flexion and hip flexion LLE Deficits / Details: 4/5 knee extension, 3+/5 knee flexion, hip flexion 3+/5    Cervical / Trunk Assessment Cervical / Trunk Assessment: Normal  Communication   Communication: Expressive difficulties  Cognition Arousal/Alertness: Awake/alert Behavior During Therapy: WFL for tasks assessed/performed Overall Cognitive Status: Within Functional Limits for tasks assessed                                 General Comments: Follows one step commands accurately. A&O x4.      General Comments General comments (skin integrity, edema, etc.): VSS on RA. HR 67-77    Exercises     Assessment/Plan    PT Assessment Patient needs continued PT services  PT Problem List         PT Treatment Interventions DME instruction;Gait training;Stair training;Functional mobility training;Therapeutic activities;Therapeutic exercise;Balance training    PT Goals (Current goals can be found in the Care Plan section)  Acute Rehab PT Goals Patient Stated Goal: Get around better and use L arm PT Goal Formulation: With patient Time For Goal Achievement: 01/25/21 Potential to Achieve Goals: Good    Frequency Min 4X/week   Barriers to discharge Decreased caregiver support Intermittent support at home    Co-evaluation               AM-PAC PT "6 Clicks" Mobility  Outcome Measure Help needed turning from your back to your side while in a flat bed without using bedrails?: None Help needed moving from lying on your back to sitting on the side of a flat bed without using bedrails?: None Help needed moving to and from a bed to a chair (including a wheelchair)?: A Little Help needed standing up from a chair using your arms (e.g., wheelchair or bedside chair)?: A Little Help needed to walk in hospital room?: A Little Help needed climbing 3-5 steps with a railing? : A Lot 6 Click Score: 19    End of Session Equipment Utilized  During Treatment: Gait belt Activity Tolerance: Patient tolerated treatment well Patient left: in chair;with chair alarm set;with call bell/phone within reach Nurse Communication: Mobility status PT Visit Diagnosis: Unsteadiness on feet (R26.81);Other abnormalities of gait and mobility (R26.89);Muscle weakness (generalized) (M62.81);Hemiplegia and hemiparesis    Time: 7342-8768 PT Time Calculation (min) (ACUTE ONLY): 23 min   Charges:   PT Evaluation $PT Eval Moderate Complexity: 1 Mod PT Treatments $Gait Training: 8-22 mins        Velda Shell, SPT Acute Rehab: [phone]    Vance Gather 01/11/2021, 10:40 AM

## 2021-01-11 NOTE — Progress Notes (Signed)
Pt passed bedside test with ice, water, and applesauce. Will update daytime staff.

## 2021-01-11 NOTE — TOC CAGE-AID Note (Signed)
Transition of Care [facility]) - CAGE-AID Screening   Patient Details  Name: Christopher [name] MRN: [id number] Date of Birth: 12/13/1965  [facility] CM/SW Contact:    Lossie Faes Tarpley-Carter, LCSWA Phone Number: 01/11/2021, 2:22 PM   Clinical Narrative: Pt participated in Cage-Aid.  Pt stated he uses substance and ETOH.  Pt was offered resources, pt accepted.    Baley Lorimer Tarpley-Carter, MSW, LCSW-A Pronouns:  She/Her/Hers [facility] HealthTransitions of Care Clinical Social Worker Direct Number:  [phone] [email or web address]  CAGE-AID Screening:    Have You Ever Felt You Ought to Cut Down on Your Drinking or Drug Use?: Yes Have People Annoyed You By Critizing Your Drinking Or Drug Use?: No Have You Felt Bad Or Guilty About Your Drinking Or Drug Use?: No Have You Ever Had a Drink or Used Drugs First Thing In The Morning to Steady Your Nerves or to Get Rid of a Hangover?: No CAGE-AID Score: 1  Substance Abuse Education Offered: Yes  Substance abuse interventions: Transport planner

## 2021-01-11 NOTE — Progress Notes (Addendum)
STROKE TEAM PROGRESS NOTE   ATTENDING NOTE: I reviewed above note and agree with the assessment and plan. Pt was seen and examined.   55 year old male with history of smoking, cocaine abuse admitted for slurred speech, falling, left-sided weakness.  CT showed left thalamic, left caudate head and right BG age-indeterminate infarcts.  MRI showed right BG acute infarct.  CTA head and neck bilateral VA stenosis.  EF 55 to 60%.  A1c 5.8, LDL 60.  UDS positive for cocaine and THC.  Creatinine 1.2, WBC 10.9.  On exam, awake, alert, eyes open, orientated to age, place, month. No aphasia, fluent language, following all simple commands. Able to name and repeat. No gaze palsy, tracking bilaterally, visual field full, PERRL. Slight left facial droop. Tongue midline. RUE and RLE 5/5, LUE 3+/5 proximal and 4/5 distally. LLE proximal and distal 4+/5. Sensation symmetrical bilaterally, b/l FTN intact although left FTN slow, gait not tested.   Etiology for patient stroke likely due to small vessel disease in the setting of smoking and cocaine abuse.  Cocaine and smoking cessation education provided, and he is willing to quit.  Recommend aspirin 81 and Plavix 75 DAPT for 3 weeks and then aspirin alone.  Start Lipitor 20 low-dose statin for stroke prevention given LDL at goal.  PT/OT recommend CIR.  For detailed assessment and plan, please refer to above as I have made changes wherever appropriate.   Neurology will sign off. Please call with questions. Pt will follow up with stroke clinic NP at [facility] in about 4 weeks. Thanks for the consult.  Marvel Plan, MD PhD Stroke Neurology 01/11/2021 6:18 PM    INTERVAL HISTORY No acute events overnight  Mr. Reitter reports that his left UE weakness and speech seem a bit better today. He is not noticing a speech problem today. He is still experiencing leaning towards the left with attempts to ambulate.  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 He is not sure how much help he will have at home. We discussed his polysubstance abuse and he was advised to quit. He reports his drinking has been worse than it has been lately.  He is willing to quit as he states "I don't really have a choice except to stop." SW has been engaged for this. He is willing to participate in rehabilitation efforts so that he can get back to work.   We discussed his stroke diagnosis, ongoing work up and plan of care. He did not have any questions at present.   Vitals:   01/10/21 1430 01/10/21 1513 01/10/21 2059 01/11/21 0408  BP: (!) 195/105 (!) 195/95 (!) 164/96 (!) 148/92  Pulse: 69 66 69 67  Resp: Temp: 98.2 F (36.8 C) 98.7 F (37.1 C) 98.5 F (36.9 C) 98.4 F (36.9 C)  TempSrc: Oral Oral Oral Oral  SpO2: 98% 98% 94% 96%   CBC:  Recent Labs  Lab 01/10/21 1035 01/11/21 0109  WBC 14.1* 10.9*  NEUTROABS 11.0*  --   HGB 15.7 14.4  HCT 48.5 42.9  MCV 90.8 88.1  PLT 319 275   Basic Metabolic Panel:  Recent Labs  Lab 01/10/21 1035 01/11/21 0109  NA 137 134*  K 4.5 3.6  CL 103 99  CO2 27 27  GLUCOSE 114* 114*  BUN 12 12  CREATININE 1.28* 1.20  CALCIUM 9.2 8.6*   Lipid Panel:  Recent Labs  Lab 01/11/21 0109  CHOL 102  TRIG 86  HDL 25*  CHOLHDL 4.1  VLDL 17  LDLCALC 60   HgbA1c:  Recent Labs  Lab 01/11/21 0109  HGBA1C 5.8*   Urine Drug Screen:  Recent Labs  Lab 01/10/21 1400  LABOPIA NONE DETECTED  COCAINSCRNUR POSITIVE*  LABBENZ NONE DETECTED  AMPHETMU NONE DETECTED  THCU POSITIVE*  LABBARB NONE DETECTED    Alcohol Level No results for input(s): ETH in the last 168 hours.  IMAGING past 24 hours CT ANGIO HEAD NECK W WO CM  Result Date: 01/10/2021 CLINICAL DATA:  Follow-up examination for acute stroke. EXAM: CT ANGIOGRAPHY HEAD AND NECK TECHNIQUE: Multidetector CT imaging of the head and neck was performed using the standard protocol during bolus administration of intravenous contrast. Multiplanar  CT image reconstructions and MIPs were obtained to evaluate the vascular anatomy. Carotid stenosis measurements (when applicable) are obtained utilizing NASCET criteria, using the distal internal carotid diameter as the denominator. CONTRAST:  75mL OMNIPAQUE IOHEXOL 350 MG/ML SOLN COMPARISON:  Prior CTA and MRI from earlier the same day. FINDINGS: CTA NECK FINDINGS Aortic arch: Visualized aortic arch normal in caliber with normal branch pattern. Mild atheromatous change about the arch and origin of the great vessels without hemodynamically significant stenosis. Right carotid system: Scattered atheromatous irregularity about the right common and internal carotid arteries without hemodynamically significant stenosis. No dissection or vascular occlusion. Left carotid system: Scattered atheromatous irregularity about the left common and internal carotid arteries without hemodynamically significant stenosis. No dissection or vascular occlusion. Vertebral arteries: Both vertebral arteries arise from the subclavian arteries. No proximal subclavian artery stenosis. Atheromatous change at the origins of both vertebral arteries with associated moderate stenosis on the left and severe stenosis on the right. Vertebral arteries otherwise patent distally without stenosis, dissection or occlusion. Skeleton: No visible acute osseous finding. No discrete or worrisome osseous lesions. Reversal of the normal cervical lordosis with grade 1 anterolisthesis of C3 on C4 and C4 on C5, with trace retrolisthesis of C5 on C6 and C6 on C7. Moderate spondylosis present at C5-6 and C6-7. Other neck: No other acute soft tissue abnormality within the neck. No mass or adenopathy. Punctate sialolith noted within the right parotid gland. Upper chest: Emphysema with scattered bronchiectatic changes. Visualized upper chest demonstrates no other acute finding. Review of the MIP images confirms the above findings CTA HEAD FINDINGS Anterior circulation:  Both internal carotid arteries patent to the termini without hemodynamically significant stenosis. A1 segments patent bilaterally. Normal anterior communicating artery complex. Anterior cerebral arteries patent to their distal aspects without stenosis. No M1 stenosis or occlusion. Normal MCA bifurcations. No proximal MCA branch occlusion. Distal MCA branches well perfused and symmetric. Posterior circulation: Both vertebral arteries patent to the vertebrobasilar junction without stenosis. Left vertebral artery slightly dominant. Both PICA origins patent and normal. Basilar patent to its distal aspect without stenosis. Superior cerebellar arteries patent bilaterally. Right PCA supplied via the basilar. Fetal type origin of the left PCA. Atheromatous irregularity within the right greater than left PCAs without hemodynamically significant stenosis. Venous sinuses: Patent allowing for timing the contrast bolus. Anatomic variants: Fetal type origin of the left PCA. No intracranial aneurysm. Review of the MIP images confirms the above findings IMPRESSION: 1. Negative CTA for large vessel occlusion. 2. 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 3. Additional mild-to-moderate atheromatous irregularity elsewhere about the major arterial vasculature of the head and neck. No other hemodynamically significant or  correctable stenosis. 4. Emphysema (ICD10-J43.9). Electronically Signed   By: Rise MuBenjamin  McClintock M.D.   On: 01/10/2021 23:00   MR BRAIN WO CONTRAST  Result Date: 01/10/2021 CLINICAL DATA:  Neuro deficit, acute, stroke suspected. EXAM: MRI HEAD WITHOUT CONTRAST TECHNIQUE: Multiplanar, multiecho pulse sequences of the brain and surrounding structures were obtained without intravenous contrast. COMPARISON:  Head CT 01/10/2021. FINDINGS: Brain: Mild generalized cerebral and cerebellar atrophy. 11 mm acute infarct within the  right thalamocapsular junction (for instance as seen on series 5, image 74). 5 mm focus of diffusion weighted hyperintensity (with corresponding intermediate signal on the ADC map) within the paramedian mid to anterior right frontal lobe (series 5, image 81). This likely reflects a subacute infarct. Chronic lacunar infarcts within the bilateral basal ganglia and left thalamus. Background mild-to-moderate multifocal T2/FLAIR hyperintensity within the cerebral white matter and pons, nonspecific but compatible with chronic small vessel ischemic disease. No evidence of an intracranial mass. No chronic intracranial blood products. No extra-axial fluid collection. No midline shift. Vascular: Maintained flow voids within the proximal large arterial vessels. Skull and upper cervical spine: No focal suspicious marrow lesion. Sinuses/Orbits: Visualized orbits show no acute finding. Trace mucosal thickening within the right frontal and bilateral ethmoid air cells. IMPRESSION: 11 mm acute lacunar infarct within the right thalamocapsular junction. 5 mm probable subacute infarct within the subcortical white matter of the mid-to-anterior right frontal lobe. Chronic lacunar infarcts within the bilateral basal ganglia and left thalamus. Background mild-to-moderate chronic small vessel ischemic disease within the cerebral white matter and pons. Mild generalized parenchymal atrophy. Minimal paranasal sinus mucosal thickening, as described. Electronically Signed   By: Jackey LogeKyle  Golden D.O.   On: 01/10/2021 15:13   ECHOCARDIOGRAM COMPLETE  Result Date: 01/10/2021    ECHOCARDIOGRAM REPORT   Patient Name:   Cristela FeltCHARLES E Rigg Date of Exam: 01/10/2021 Medical Rec #:  [id number]        Height:       69.0 in Accession #:    [id number]       Weight:       136.6 lb Date of Birth:  1966-01-21        BSA:          1.757 m Patient Age:    55 years         BP:           195/95 mmHg Patient Gender: M                HR:           56 bpm. Exam Location:   Inpatient Procedure: 2D Echo, Cardiac Doppler and Color Doppler Indications:    Stroke I63.9  History:        Patient has no prior history of Echocardiogram examinations.  Sonographer:    Roosvelt Maserachel Lane RDCS Referring Phys: [id number] ALLISON WOLFE IMPRESSIONS  1. Left ventricular ejection fraction, by estimation, is 55 to 60%. The left ventricle has normal function. The left ventricle has no regional wall motion abnormalities. There is mild left ventricular hypertrophy. Left ventricular diastolic parameters are consistent with Grade I diastolic dysfunction (impaired relaxation).  2. Right ventricular systolic function is normal. The right ventricular size is normal. There is normal pulmonary artery systolic pressure. The estimated right ventricular systolic pressure is 31.3 mmHg.  3. The mitral valve is normal in structure. No evidence of mitral valve regurgitation. No evidence of mitral stenosis.  4. The aortic valve was not well visualized. Aortic valve regurgitation is  mild. No aortic stenosis is present.  5. The inferior vena cava is normal in size with greater than 50% respiratory variability, suggesting right atrial pressure of 3 mmHg. FINDINGS  Left Ventricle: Left ventricular ejection fraction, by estimation, is 55 to 60%. The left ventricle has normal function. The left ventricle has no regional wall motion abnormalities. The left ventricular internal cavity size was normal in size. There is  mild left ventricular hypertrophy. Left ventricular diastolic parameters are consistent with Grade I diastolic dysfunction (impaired relaxation). Right Ventricle: The right ventricular size is normal. No increase in right ventricular wall thickness. Right ventricular systolic function is normal. There is normal pulmonary artery systolic pressure. The tricuspid regurgitant velocity is 2.66 m/s, and  with an assumed right atrial pressure of 3 mmHg, the estimated right ventricular systolic pressure is 31.3 mmHg. Left Atrium:  Left atrial size was normal in size. Right Atrium: Right atrial size was normal in size. Pericardium: There is no evidence of pericardial effusion. Mitral Valve: The mitral valve is normal in structure. No evidence of mitral valve regurgitation. No evidence of mitral valve stenosis. Tricuspid Valve: The tricuspid valve is normal in structure. Tricuspid valve regurgitation is trivial. Aortic Valve: The aortic valve was not well visualized. Aortic valve regurgitation is mild. No aortic stenosis is present. Aortic valve mean gradient measures 4.0 mmHg. Aortic valve peak gradient measures 6.5 mmHg. Aortic valve area, by VTI measures 3.17  cm. Pulmonic Valve: The pulmonic valve was normal in structure. Pulmonic valve regurgitation is not visualized. No evidence of pulmonic stenosis. Aorta: The aortic root is normal in size and structure. Venous: The inferior vena cava is normal in size with greater than 50% respiratory variability, suggesting right atrial pressure of 3 mmHg. IAS/Shunts: No atrial level shunt detected by color flow Doppler.  LEFT VENTRICLE PLAX 2D LVIDd:         3.60 cm  Diastology LVIDs:         2.70 cm  LV e' medial:    6.60 cm/s LV PW:         1.25 cm  LV E/e' medial:  11.0 LV IVS:        1.05 cm  LV e' lateral:   9.23 cm/s LVOT diam:     2.10 cm  LV E/e' lateral: 7.9 LV SV:         82 LV SV Index:   47 LVOT Area:     3.46 cm  RIGHT VENTRICLE RV Basal diam:  3.30 cm LEFT ATRIUM           Index       RIGHT ATRIUM           Index LA diam:      3.20 cm 1.82 cm/m  RA Area:     12.60 cm LA Vol (A2C): 36.2 ml 20.58 ml/m RA Volume:   30.20 ml  17.19 ml/m LA Vol (A4C): 41.9 ml 23.85 ml/m  AORTIC VALVE AV Area (Vmax):    2.86 cm AV Area (Vmean):   2.75 cm AV Area (VTI):     3.17 cm AV Vmax:           127.00 cm/s AV Vmean:          92.800 cm/s AV VTI:            0.258 m AV Peak Grad:      6.5 mmHg AV Mean Grad:      4.0 mmHg LVOT Vmax:  105.00 cm/s LVOT Vmean:        73.600 cm/s LVOT VTI:           0.236 m LVOT/AV VTI ratio: 0.91  AORTA Ao Root diam: 3.20 cm MITRAL VALVE               TRICUSPID VALVE MV Area (PHT): 3.46 cm    TR Peak grad:   28.3 mmHg MV Decel Time: 219 msec    TR Vmax:        266.00 cm/s MV E velocity: 72.80 cm/s MV A velocity: 81.20 cm/s  SHUNTS MV E/A ratio:  0.90        Systemic VTI:  0.24 m                            Systemic Diam: 2.10 cm Dalton McleanMD Electronically signed by Wilfred Lacy Signature Date/Time: 01/10/2021/4:52:23 PM    Final     PHYSICAL EXAM  Temp:  [98.4 F (36.9 C)-98.7 F (37.1 C)] 98.4 F (36.9 C) (09/06 0408) Pulse Rate:  [66-69] 67 (09/06 0408) Resp:  [16-18] 18 (09/06 0408) BP: (148-195)/(92-96) 148/92 (09/06 0408) SpO2:  [94 %-98 %] 96 % (09/06 0408)  General - Appears thin, chronically ill and older than age.   Ophthalmologic - fundi not visualized due to noncooperation.  Cardiovascular - Regular rhythm and rate.  Mental Status -  Level of arousal and orientation to person, place and date.  Language including expression, naming, repetition, comprehension was assessed and found intact with some mild slowing. Attention span and concentration were normal. Recent and remote memory were intact. Fund of Knowledge was assessed and was intact.  Cranial Nerves II - XII - II - Visual field intact OU. III, IV, VI - Extraocular movements intact. V - Facial sensation intact bilaterally. VII - Subtle facial droop on the left.  VIII - Hearing grossly intact. X - Palate elevates symmetrically. XI - Chin turning & shoulder shrug intact bilaterally. XII - Tongue protrusion intact.  Motor Strength - Right UE and RLE 4+/5, LUE antigravity but not able to clear bed more than 3 seconds with weakness in deltoid 3/5, Lower arm 4/5. Grip strength 4-/5.   Motor Tone - Muscle tone was assessed at the neck and appendages and was normal. Sensory - Light touch, temperature/pinprick were assessed and were symmetrical.    Coordination - Impaired  LUE with satellite during arm rolling, + dysmetria, impaired RAMs, Left lower extremity impaired heel to shin. Coordination intact in RUE and RLE.  Gait and Station - deferred.   ASSESSMENT/PLAN AWAB ABEBE is a 55 y.o. male with a medical history significant for tobacco use and cocaine abuse with last use approximately 3 days ago who presented to the ED for evaluation of a 1 day history of slurred speech and falling towards the left with ambulation.   Lacunar infarct within the right thalamocapsular junction which is likely related to cocaine use   Code Stroke CT head Age indeterminate lacunar infarct in the lateral left thalamus. Lacunar infarcts involving the right caudate head and right putamen appear more remote.  Areas of cortical hypoattenuation at the exophytic poles, left greater than right. These may represent acute or subacute infarcts. CTA head & neck  Negative CTA for large vessel occlusion. 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 Additional mild-to-moderate atheromatous irregularity elsewhere about the major  arterial vasculature of the head and neck. MRI Brain 11 mm acute lacunar infarct within the right thalamocapsular junction. 5 mm probable subacute infarct within the subcortical white matter of the mid-to-anterior right frontal lobe. Chronic lacunar infarcts within the bilateral basal ganglia and left thalamus. Background mild-to-moderate chronic small vessel ischemic disease within the cerebral white matter and pons. Mild generalized parenchymal atrophy.  LDL 60 HgbA1c 5.8 VTE prophylaxis - recommended    Diet   Diet Heart Room service appropriate? Yes; Fluid consistency: Thin  2D Echo EF 55-60%, mild LVH, Grade I diastolic dysfunction, No thrombus, wall motion abnormality or shunt found.   No anticoagulant or antiplatelet prior to admission  Therapy  recommendations:  ?CIR Disposition:  TBD  Hypertension Home meds:  None Management per primary team  Stable currently, elevated 160s-190s/80-105 yesterday  Permissive hypertension (OK if < 220/120) but gradually normalize in 5-7 days Long-term BP goal normotensive Will need to establish with PCP for close follow up   Hyperlipidemia Home meds: None LDL 60, at goal < 70 High intensity statin not indicated as patient is at goal: Lipitor 20mg  initiated  Continue statin at discharge  Polysubstance Abuse Using crack cocaine, ETOH, and smoking cigarettes and marijuana. Advised to quit.  Seems to have some insight. Willing to attempt to quit. SW is engaged  Consider thiamine/folic acid administration  ?Nutritional status- Appears possibly underweight, no weight recorded. Have ordered weight measurement.   Other Stroke Risk Factors None identified   Other Active Problems   Hospital day # 0 Plan of care directed by Dr. .  Roda Shutters, NP-C  To contact Stroke Continuity provider, please refer to [facility]. After hours, contact General Neurology

## 2021-01-11 NOTE — Progress Notes (Signed)
[facility] PROGRESS NOTE   Christopher [name] SEG:[id number] DOB: 1965-11-09 DOA: 01/10/2021  PCP: Patient, No Pcp Per (Inactive)  Brief History/Interval Summary: 55 y.o. male with medical history significant tobacco abuse (1 PPD) and hx of cocaine abuse who last used about 3 days prior to admission presented with 1 day history of slurred speech and right-sided weakness.,  Concern was for stroke.  Patient was hospitalized for further management.   Consultants: Neurology  Procedures: Transthoracic echocardiogram (see report below)  Antibiotics: Anti-infectives (From admission, onward)    None       Subjective/Interval History: Patient not very communicative.  Denies any headaches.  He was counseled about his cocaine use.  He does mention left-sided weakness.  Denies any chest pain or shortness of breath.    Assessment/Plan:  Acute stroke involving the right cerebral hemisphere Neurology is following.  MRI showed right-sided stroke.  Patient does have left-sided weakness although initially it appears that he was being evaluated for right-sided weakness. CT angiogram head and neck does not show any significant stenosis. Patient placed on aspirin.  HbA1c 5.8.  LDL 60.  PT OT SLP evaluation. Echocardiogram shows normal systolic function.  Grade 1 diastolic dysfunction noted. Permissive hypertension.  Elevated blood pressure Apparently never been officially diagnosed but likely has longstanding untreated hypertension.  Blood pressure is also elevated in the setting of cocaine use.  Currently allowing permissive hypertension.  Polysubstance abuse Urine drug screen positive for cocaine and marijuana.  Patient was counseled.  HIV is nonreactive.  Acute kidney injury hyponatremia Creatinine was 1.28 on admission.  Baseline creatinine appears to be 0.9.  Improved to 1.2 today after he was given IV fluids yesterday.  Continue to monitor.  Monitor urine  output.  Leukocytosis Likely reactive.  Noted to be better today.  Tobacco abuse Declined nicotine patch.   DVT Prophylaxis: Lovenox Code Status: Full code Family Communication: No family at bedside.  Discussed with patient Disposition Plan: Seen by OT who recommended home health.  Waiting on PT evaluation.    Status is: Observation  Unclear as of now if the patient will require care spanning > 2 midnights   Dispo: The patient is from: Home              Anticipated d/c is to: Home              Patient currently is not medically stable to d/c.   Difficult to place patient No       Medications: Scheduled:  aspirin  81 mg Oral Daily   atorvastatin  20 mg Oral Daily   clopidogrel  75 mg Oral Daily   enoxaparin (LOVENOX) injection  40 mg Subcutaneous Q24H   Continuous: VPX:TGGYIRSWNIOEV **OR** acetaminophen (TYLENOL) oral liquid 160 mg/5 mL **OR** acetaminophen, hydrALAZINE, senna-docusate   Objective:  Vital Signs  Vitals:   01/10/21 1430 01/10/21 1513 01/10/21 2059 01/11/21 0408  BP: (!) 195/105 (!) 195/95 (!) 164/96 (!) 148/92  Pulse: 69 66 69 67  Resp: 17 16 18 18   Temp: 98.2 F (36.8 C) 98.7 F (37.1 C) 98.5 F (36.9 C) 98.4 F (36.9 C)  TempSrc: Oral Oral Oral Oral  SpO2: 98% 98% 94% 96%    Intake/Output Summary (Last 24 hours) at 01/11/2021 1005 Last data filed at 01/11/2021 0948 Gross per 24 hour  Intake 360 ml  Output 850 ml  Net -490 ml   There were no vitals filed for this visit.  General appearance: Awake alert.  In no distress Resp: Clear to auscultation bilaterally.  Normal effort Cardio: S1-S2 is normal regular.  No S3-S4.  No rubs murmurs or bruit GI: Abdomen is soft.  Nontender nondistended.  Bowel sounds are present normal.  No masses organomegaly Extremities: No edema.  Full range of motion of lower extremities. Neurologic: Left-sided weakness noted.   Lab Results:  Data Reviewed: I have personally reviewed following labs and  imaging studies  CBC: Recent Labs  Lab 01/10/21 1035 01/11/21 0109  WBC 14.1* 10.9*  NEUTROABS 11.0*  --   HGB 15.7 14.4  HCT 48.5 42.9  MCV 90.8 88.1  PLT 319 275    Basic Metabolic Panel: Recent Labs  Lab 01/10/21 1035 01/11/21 0109  NA 137 134*  K 4.5 3.6  CL 103 99  CO2 27 27  GLUCOSE 114* 114*  BUN 12 12  CREATININE 1.28* 1.20  CALCIUM 9.2 8.6*    GFR: CrCl cannot be calculated (Unknown ideal weight.).  Liver Function Tests: Recent Labs  Lab 01/10/21 1035  AST 23  ALT 18  ALKPHOS 58  BILITOT 0.8  PROT 7.0  ALBUMIN 3.8     Coagulation Profile: Recent Labs  Lab 01/10/21 1035  INR 1.0     HbA1C: Recent Labs    01/11/21 0109  HGBA1C 5.8*    CBG: Recent Labs  Lab 01/10/21 1028  GLUCAP 86    Lipid Profile: Recent Labs    01/11/21 0109  CHOL 102  HDL 25*  LDLCALC 60  TRIG 86  CHOLHDL 4.1     Recent Results (from the past 240 hour(s))  SARS CORONAVIRUS 2 (TAT 6-24 HRS) Nasopharyngeal Nasopharyngeal Swab     Status: None   Collection Time: 01/10/21 12:24 PM   Specimen: Nasopharyngeal Swab  Result Value Ref Range Status   SARS Coronavirus 2 NEGATIVE NEGATIVE Final    Comment: (NOTE) SARS-CoV-2 target nucleic acids are NOT DETECTED.  The SARS-CoV-2 RNA is generally detectable in upper and lower respiratory specimens during the acute phase of infection. Negative results do not preclude SARS-CoV-2 infection, do not rule out co-infections with other pathogens, and should not be used as the sole basis for treatment or other patient management decisions. Negative results must be combined with clinical observations, patient history, and epidemiological information. The expected result is Negative.  Fact Sheet for Patients: HairSlick.no  Fact Sheet for Healthcare Providers: quierodirigir.com  This test is not yet approved or cleared by the [place] FDA and  has been  authorized for detection and/or diagnosis of SARS-CoV-2 by FDA under an Emergency Use Authorization (EUA). This EUA will remain  in effect (meaning this test can be used) for the duration of the COVID-19 declaration under Se ction 564(b)(1) of the Act, 21 U.S.C. section 360bbb-3(b)(1), unless the authorization is terminated or revoked sooner.  Performed at [facility], [address] [place], [place] [address]       Radiology Studies: CT ANGIO HEAD NECK W WO CM  Result Date: 01/10/2021 CLINICAL DATA:  Follow-up examination for acute stroke. EXAM: CT ANGIOGRAPHY HEAD AND NECK TECHNIQUE: Multidetector CT imaging of the head and neck was performed using the standard protocol during bolus administration of intravenous contrast. Multiplanar CT image reconstructions and MIPs were obtained to evaluate the vascular anatomy. Carotid stenosis measurements (when applicable) are obtained utilizing NASCET criteria, using the distal internal carotid diameter as the denominator. CONTRAST:  75mL OMNIPAQUE IOHEXOL 350 MG/ML SOLN COMPARISON:  Prior CTA and MRI from earlier  the same day. FINDINGS: CTA NECK FINDINGS Aortic arch: Visualized aortic arch normal in caliber with normal branch pattern. Mild atheromatous change about the arch and origin of the great vessels without hemodynamically significant stenosis. Right carotid system: Scattered atheromatous irregularity about the right common and internal carotid arteries without hemodynamically significant stenosis. No dissection or vascular occlusion. Left carotid system: Scattered atheromatous irregularity about the left common and internal carotid arteries without hemodynamically significant stenosis. No dissection or vascular occlusion. Vertebral arteries: Both vertebral arteries arise from the subclavian arteries. No proximal subclavian artery stenosis. Atheromatous change at the origins of both vertebral arteries with associated moderate stenosis on the  left and severe stenosis on the right. Vertebral arteries otherwise patent distally without stenosis, dissection or occlusion. Skeleton: No visible acute osseous finding. No discrete or worrisome osseous lesions. Reversal of the normal cervical lordosis with grade 1 anterolisthesis of C3 on C4 and C4 on C5, with trace retrolisthesis of C5 on C6 and C6 on C7. Moderate spondylosis present at C5-6 and C6-7. Other neck: No other acute soft tissue abnormality within the neck. No mass or adenopathy. Punctate sialolith noted within the right parotid gland. Upper chest: Emphysema with scattered bronchiectatic changes. Visualized upper chest demonstrates no other acute finding. Review of the MIP images confirms the above findings CTA HEAD FINDINGS Anterior circulation: Both internal carotid arteries patent to the termini without hemodynamically significant stenosis. A1 segments patent bilaterally. Normal anterior communicating artery complex. Anterior cerebral arteries patent to their distal aspects without stenosis. No M1 stenosis or occlusion. Normal MCA bifurcations. No proximal MCA branch occlusion. Distal MCA branches well perfused and symmetric. Posterior circulation: Both vertebral arteries patent to the vertebrobasilar junction without stenosis. Left vertebral artery slightly dominant. Both PICA origins patent and normal. Basilar patent to its distal aspect without stenosis. Superior cerebellar arteries patent bilaterally. Right PCA supplied via the basilar. Fetal type origin of the left PCA. Atheromatous irregularity within the right greater than left PCAs without hemodynamically significant stenosis. Venous sinuses: Patent allowing for timing the contrast bolus. Anatomic variants: Fetal type origin of the left PCA. No intracranial aneurysm. Review of the MIP images confirms the above findings IMPRESSION: 1. Negative CTA for large vessel occlusion. 2. Atheromatous change at the origins of both vertebral arteries  with associated moderate left and severe right vertebral artery stenoses. 3. Additional mild-to-moderate atheromatous irregularity elsewhere about the major arterial vasculature of the head and neck. No other hemodynamically significant or correctable stenosis. 4. Emphysema (ICD10-J43.9). Electronically Signed   By: Rise Mu M.D.   On: 01/10/2021 23:00   CT HEAD WO CONTRAST  Result Date: 01/10/2021 CLINICAL DATA:  Right upper extremity weakness.  Unsteady gait. EXAM: CT HEAD WITHOUT CONTRAST TECHNIQUE: Contiguous axial images were obtained from the base of the skull through the vertex without intravenous contrast. COMPARISON:  None. FINDINGS: Brain: Age indeterminate lacunar infarct present in the lateral left thalamus. Lacunar infarcts involving the right caudate head and right putamen appear more remote. Periventricular white matter changes noted bilaterally. Additional subcortical white matter hypoattenuation scattered bilaterally. Areas of cortical hypoattenuation noted at the exophytic poles, left greater than right. No other focal cortical abnormality is present. Brainstem and cerebellum are normal. No acute hemorrhage or mass lesion is present. The ventricles are of normal size. No significant extraaxial fluid collection is present. Vascular: No hyperdense vessel or unexpected calcification. Skull: Calvarium is intact. No focal lytic or blastic lesions are present. No significant extracranial soft tissue lesion is present. Sinuses/Orbits: The  paranasal sinuses and mastoid air cells are clear. The globes and orbits are within normal limits. IMPRESSION: 1. Age indeterminate lacunar infarct in the lateral left thalamus. 2. Lacunar infarcts involving the right caudate head and right putamen appear more remote. 3. Areas of cortical hypoattenuation at the exophytic poles, left greater than right. These may represent acute or subacute infarcts. MRI could be used for further evaluation if clinically  indicated. 4. Atrophy and white matter disease likely reflects the sequela of chronic microvascular ischemia. Electronically Signed   By: Marin Roberts M.D.   On: 01/10/2021 11:24   MR BRAIN WO CONTRAST  Result Date: 01/10/2021 CLINICAL DATA:  Neuro deficit, acute, stroke suspected. EXAM: MRI HEAD WITHOUT CONTRAST TECHNIQUE: Multiplanar, multiecho pulse sequences of the brain and surrounding structures were obtained without intravenous contrast. COMPARISON:  Head CT 01/10/2021. FINDINGS: Brain: Mild generalized cerebral and cerebellar atrophy. 11 mm acute infarct within the right thalamocapsular junction (for instance as seen on series 5, image 74). 5 mm focus of diffusion weighted hyperintensity (with corresponding intermediate signal on the ADC map) within the paramedian mid to anterior right frontal lobe (series 5, image 81). This likely reflects a subacute infarct. Chronic lacunar infarcts within the bilateral basal ganglia and left thalamus. Background mild-to-moderate multifocal T2/FLAIR hyperintensity within the cerebral white matter and pons, nonspecific but compatible with chronic small vessel ischemic disease. No evidence of an intracranial mass. No chronic intracranial blood products. No extra-axial fluid collection. No midline shift. Vascular: Maintained flow voids within the proximal large arterial vessels. Skull and upper cervical spine: No focal suspicious marrow lesion. Sinuses/Orbits: Visualized orbits show no acute finding. Trace mucosal thickening within the right frontal and bilateral ethmoid air cells. IMPRESSION: 11 mm acute lacunar infarct within the right thalamocapsular junction. 5 mm probable subacute infarct within the subcortical white matter of the mid-to-anterior right frontal lobe. Chronic lacunar infarcts within the bilateral basal ganglia and left thalamus. Background mild-to-moderate chronic small vessel ischemic disease within the cerebral white matter and pons. Mild  generalized parenchymal atrophy. Minimal paranasal sinus mucosal thickening, as described. Electronically Signed   By: Jackey Loge D.O.   On: 01/10/2021 15:13   ECHOCARDIOGRAM COMPLETE  Result Date: 01/10/2021    ECHOCARDIOGRAM REPORT   Patient Name:   Christopher [name] Date of Exam: 01/10/2021 Medical Rec #:  [id number]        Height:       69.0 in Accession #:    [id number]       Weight:       136.6 lb Date of Birth:  [birth year]        BSA:          1.757 m Patient Age:    55 years         BP:           195/95 mmHg Patient Gender: M                HR:           56 bpm. Exam Location:  Inpatient Procedure: 2D Echo, Cardiac Doppler and Color Doppler Indications:    Stroke I63.9  History:        Patient has no prior history of Echocardiogram examinations.  Sonographer:    Roosvelt Maser RDCS Referring Phys: [id number] ALLISON WOLFE IMPRESSIONS  1. Left ventricular ejection fraction, by estimation, is 55 to 60%. The left ventricle has normal function. The left ventricle has no regional wall motion abnormalities. There is  mild left ventricular hypertrophy. Left ventricular diastolic parameters are consistent with Grade I diastolic dysfunction (impaired relaxation).  2. Right ventricular systolic function is normal. The right ventricular size is normal. There is normal pulmonary artery systolic pressure. The estimated right ventricular systolic pressure is 31.3 mmHg.  3. The mitral valve is normal in structure. No evidence of mitral valve regurgitation. No evidence of mitral stenosis.  4. The aortic valve was not well visualized. Aortic valve regurgitation is mild. No aortic stenosis is present.  5. The inferior vena cava is normal in size with greater than 50% respiratory variability, suggesting right atrial pressure of 3 mmHg. FINDINGS  Left Ventricle: Left ventricular ejection fraction, by estimation, is 55 to 60%. The left ventricle has normal function. The left ventricle has no regional wall motion abnormalities. The  left ventricular internal cavity size was normal in size. There is  mild left ventricular hypertrophy. Left ventricular diastolic parameters are consistent with Grade I diastolic dysfunction (impaired relaxation). Right Ventricle: The right ventricular size is normal. No increase in right ventricular wall thickness. Right ventricular systolic function is normal. There is normal pulmonary artery systolic pressure. The tricuspid regurgitant velocity is 2.66 m/s, and  with an assumed right atrial pressure of 3 mmHg, the estimated right ventricular systolic pressure is 31.3 mmHg. Left Atrium: Left atrial size was normal in size. Right Atrium: Right atrial size was normal in size. Pericardium: There is no evidence of pericardial effusion. Mitral Valve: The mitral valve is normal in structure. No evidence of mitral valve regurgitation. No evidence of mitral valve stenosis. Tricuspid Valve: The tricuspid valve is normal in structure. Tricuspid valve regurgitation is trivial. Aortic Valve: The aortic valve was not well visualized. Aortic valve regurgitation is mild. No aortic stenosis is present. Aortic valve mean gradient measures 4.0 mmHg. Aortic valve peak gradient measures 6.5 mmHg. Aortic valve area, by VTI measures 3.17  cm. Pulmonic Valve: The pulmonic valve was normal in structure. Pulmonic valve regurgitation is not visualized. No evidence of pulmonic stenosis. Aorta: The aortic root is normal in size and structure. Venous: The inferior vena cava is normal in size with greater than 50% respiratory variability, suggesting right atrial pressure of 3 mmHg. IAS/Shunts: No atrial level shunt detected by color flow Doppler.  LEFT VENTRICLE PLAX 2D LVIDd:         3.60 cm  Diastology LVIDs:         2.70 cm  LV e' medial:    6.60 cm/s LV PW:         1.25 cm  LV E/e' medial:  11.0 LV IVS:        1.05 cm  LV e' lateral:   9.23 cm/s LVOT diam:     2.10 cm  LV E/e' lateral: 7.9 LV SV:         82 LV SV Index:   47 LVOT Area:      3.46 cm  RIGHT VENTRICLE RV Basal diam:  3.30 cm LEFT ATRIUM           Index       RIGHT ATRIUM           Index LA diam:      3.20 cm 1.82 cm/m  RA Area:     12.60 cm LA Vol (A2C): 36.2 ml 20.58 ml/m RA Volume:   30.20 ml  17.19 ml/m LA Vol (A4C): 41.9 ml 23.85 ml/m  AORTIC VALVE AV Area (Vmax):    2.86 cm AV Area (  Vmean):   2.75 cm AV Area (VTI):     3.17 cm AV Vmax:           127.00 cm/s AV Vmean:          92.800 cm/s AV VTI:            0.258 m AV Peak Grad:      6.5 mmHg AV Mean Grad:      4.0 mmHg LVOT Vmax:         105.00 cm/s LVOT Vmean:        73.600 cm/s LVOT VTI:          0.236 m LVOT/AV VTI ratio: 0.91  AORTA Ao Root diam: 3.20 cm MITRAL VALVE               TRICUSPID VALVE MV Area (PHT): 3.46 cm    TR Peak grad:   28.3 mmHg MV Decel Time: 219 msec    TR Vmax:        266.00 cm/s MV E velocity: 72.80 cm/s MV A velocity: 81.20 cm/s  SHUNTS MV E/A ratio:  0.90        Systemic VTI:  0.24 m                            Systemic Diam: 2.10 cm Dalton McleanMD Electronically signed by Wilfred Lacy Signature Date/Time: 01/10/2021/4:52:23 PM    Final        LOS: 0 days   Osvaldo Shipper  [facility] Pager on [email or web address]  01/11/2021, 10:05 AM

## 2021-01-11 NOTE — Plan of Care (Signed)

## 2021-01-11 NOTE — Progress Notes (Signed)
SLP Cancellation Note  Patient Details Name: PHOENIX DRESSER MRN: [id number] DOB: Sep 25, 1965   Cancelled treatment:       Reason Eval/Treat Not Completed: Patient at procedure or test/unavailable. Pt currently working with PT. Will continue efforts.   Kierston Plasencia B. Murvin Natal, [facility], CCC-SLP Speech Language Pathologist Office: [phone]  [name] 01/11/2021, 10:02 AM

## 2021-01-11 NOTE — Evaluation (Signed)
Speech Language Pathology Evaluation Patient Details Name: Christopher [name] MRN: [id number] DOB: 1965-10-31 Today's Date: 01/11/2021 Time: 4332-9518 SLP Time Calculation (min) (ACUTE ONLY): 25 min  Problem List:  Patient Active Problem List   Diagnosis Date Noted   Cerebral thrombosis with cerebral infarction 01/11/2021   Right sided weakness 01/10/2021   AKI (acute kidney injury) (HCC) 01/10/2021   Tobacco abuse 01/10/2021   Cocaine abuse (HCC) 01/10/2021   Leukocytosis 01/10/2021   Cellulitis and abscess of upper arm and forearm 11/13/2012   Episodic tobacco abuse 11/13/2012   Past Medical History: History reviewed. No pertinent past medical history. Past Surgical History: No past surgical history on file. HPI:  55yo male admitted 01/10/21 with weakness, right side weakness, and slurred speech. PMH: tobacco abuse, cocaine abuse   Assessment / Plan / Recommendation Clinical Impression  The Mini-Mental State Examination (MMSE) was administered. Pt scored 30/30, indicating cognitive linguistic skills within functional limits. Pt reports having a 9th grade education, and was working in machinery prior to admit. His speech is mildly dysarthric, due to left orofacial weakness. Continued speech therapy services would be beneficial to maximize intelligibility.    SLP Assessment  SLP Recommendation/Assessment: Patient needs continued Speech Language Pathology Services SLP Visit Diagnosis: Dysarthria and anarthria (R47.1)    Follow Up Recommendations  Inpatient Rehab    Frequency and Duration min 1 x/week  2 weeks      SLP Evaluation Cognition  Overall Cognitive Status: Within Functional Limits for tasks assessed Orientation Level: Oriented X4 Attention: Focused;Sustained Focused Attention: Appears intact Sustained Attention: Appears intact Memory: Appears intact       Comprehension  Auditory Comprehension Overall Auditory Comprehension: Appears within functional limits for  tasks assessed    Expression Expression Primary Mode of Expression: Verbal Verbal Expression Overall Verbal Expression: Appears within functional limits for tasks assessed Written Expression Dominant Hand: Left   Oral / Motor  Oral Motor/Sensory Function Overall Oral Motor/Sensory Function: Mild impairment Facial ROM: Reduced left Facial Symmetry: Abnormal symmetry left Facial Strength: Reduced left Facial Sensation: Within Functional Limits Lingual ROM: Reduced left Lingual Symmetry: Within Functional Limits Lingual Strength: Reduced Lingual Sensation: Within Functional Limits Mandible: Within Functional Limits Motor Speech Overall Motor Speech: Impaired Respiration: Within functional limits Phonation: Normal Resonance: Within functional limits Articulation: Impaired Level of Impairment: Conversation Intelligibility: Intelligibility reduced Word: 75-100% accurate Phrase: 75-100% accurate Sentence: 75-100% accurate Conversation: 75-100% accurate Motor Planning: Witnin functional limits Motor Speech Errors: Not applicable   GO                   Christopher [name] B. Murvin Natal, [facility], CCC-SLP Speech Language Pathologist Office: [phone]  [name] 01/11/2021, 11:41 AM

## 2021-01-11 NOTE — Evaluation (Signed)
Clinical/Bedside Swallow Evaluation Patient Details  Name: Christopher [name] MRN: [id number] Date of Birth: [birth year]  Today's Date: 01/11/2021 Time: SLP Start Time (ACUTE ONLY): 1000 SLP Stop Time (ACUTE ONLY): 1020 SLP Time Calculation (min) (ACUTE ONLY): 20 min  Past Medical History: History reviewed. No pertinent past medical history. Past Surgical History: No past surgical history on file. HPI:  55yo male admitted 01/10/21 with weakness, right side weakness, and slurred speech. PMH: tobacco abuse, cocaine abuse   Assessment / Plan / Recommendation Clinical Impression  Pt seen at bedside for assessment of swallow function and safety. Pt reports no history of difficulty swallowing. He has missing dentition. CN exam is remarkable for left orofacial weakness. Lingual strength is also reduced, resulting in mildly dysarthric speech. Pt accepted trials of thin liquid, puree, and solid textures. No difficulty observed on any texture, until pt took several large consecutive boluses of thin liquid. Immediate cough response resulted. No cough noted with smaller individual boluses. Will continue regular diet and thin liquids. RN reports no difficulty with medications.  SLP Visit Diagnosis: Dysphagia, unspecified (R13.10)    Aspiration Risk  Mild aspiration risk    Diet Recommendation Regular;Thin liquid   Liquid Administration via: Cup;Straw Medication Administration: Whole meds with liquid Supervision: Patient able to self feed Compensations: Slow rate;Small sips/bites Postural Changes: Seated upright at 90 degrees    Other  Recommendations Oral Care Recommendations: Oral care BID   Follow up Recommendations None             Prognosis Prognosis for Safe Diet Advancement: Good      Swallow Study   General Date of Onset: 01/10/21 HPI: 55yo male admitted 01/10/21 with weakness, right side weakness, and slurred speech. PMH: tobacco abuse, cocaine abuse Type of Study: Bedside Swallow  Evaluation Previous Swallow Assessment: none Diet Prior to this Study: Regular;Thin liquids Temperature Spikes Noted: No Respiratory Status: Room air History of Recent Intubation: No Behavior/Cognition: Alert;Cooperative;Pleasant mood Oral Cavity Assessment: Within Functional Limits Oral Care Completed by SLP: No Oral Cavity - Dentition: Missing dentition Vision: Functional for self-feeding Self-Feeding Abilities: Able to feed self;Needs assist Patient Positioning: Upright in chair Baseline Vocal Quality: Normal Volitional Cough: Strong Volitional Swallow: Able to elicit    Oral/Motor/Sensory Function Overall Oral Motor/Sensory Function: Mild impairment Facial ROM: Reduced left Facial Symmetry: Abnormal symmetry left Facial Strength: Reduced left Facial Sensation: Within Functional Limits Lingual ROM: Reduced left Lingual Symmetry: Within Functional Limits Lingual Strength: Reduced Lingual Sensation: Within Functional Limits Mandible: Within Functional Limits   Ice Chips Ice chips: Not tested   Thin Liquid Thin Liquid: Impaired Presentation: Straw Pharyngeal  Phase Impairments: Cough - Immediate Other Comments: immediate cough response noted when pt took large consecutive boluses. No cough with individual sips    Nectar Thick Nectar Thick Liquid: Not tested   Honey Thick Honey Thick Liquid: Not tested   Puree Puree: Within functional limits Presentation: Self Fed;Spoon   Solid     Solid: Within functional limits Presentation: Self Fed     Christopher [name] B. Christopher [name], Christopher [name], CCC-SLP Speech Language Pathologist Office: [phone]  Christopher [name] 01/11/2021,11:33 AM

## 2021-01-12 ENCOUNTER — Encounter (HOSPITAL_COMMUNITY): Payer: Self-pay | Admitting: Physical Medicine & Rehabilitation

## 2021-01-12 ENCOUNTER — Inpatient Hospital Stay (HOSPITAL_COMMUNITY)
Admission: RE | Admit: 2021-01-12 | Discharge: 2021-01-21 | DRG: 057 | Disposition: A | Discharge status: Home / Self Care | Payer: Medicaid Other | Source: Intra-hospital | Attending: Physical Medicine & Rehabilitation | Admitting: Physical Medicine & Rehabilitation

## 2021-01-12 ENCOUNTER — Encounter (HOSPITAL_COMMUNITY): Payer: Self-pay | Admitting: Internal Medicine

## 2021-01-12 DIAGNOSIS — N179 Acute kidney failure, unspecified: Secondary | ICD-10-CM | POA: Diagnosis present

## 2021-01-12 DIAGNOSIS — I69354 Hemiplegia and hemiparesis following cerebral infarction affecting left non-dominant side: Principal | ICD-10-CM

## 2021-01-12 DIAGNOSIS — I69322 Dysarthria following cerebral infarction: Secondary | ICD-10-CM

## 2021-01-12 DIAGNOSIS — E871 Hypo-osmolality and hyponatremia: Secondary | ICD-10-CM

## 2021-01-12 DIAGNOSIS — Z681 Body mass index (BMI) 19 or less, adult: Secondary | ICD-10-CM

## 2021-01-12 DIAGNOSIS — F1910 Other psychoactive substance abuse, uncomplicated: Secondary | ICD-10-CM | POA: Diagnosis present

## 2021-01-12 DIAGNOSIS — Z716 Tobacco abuse counseling: Secondary | ICD-10-CM

## 2021-01-12 DIAGNOSIS — E860 Dehydration: Secondary | ICD-10-CM | POA: Diagnosis present

## 2021-01-12 DIAGNOSIS — E785 Hyperlipidemia, unspecified: Secondary | ICD-10-CM | POA: Diagnosis present

## 2021-01-12 DIAGNOSIS — D72829 Elevated white blood cell count, unspecified: Secondary | ICD-10-CM | POA: Diagnosis present

## 2021-01-12 DIAGNOSIS — I69398 Other sequelae of cerebral infarction: Secondary | ICD-10-CM

## 2021-01-12 DIAGNOSIS — R630 Anorexia: Secondary | ICD-10-CM | POA: Diagnosis present

## 2021-01-12 DIAGNOSIS — I69328 Other speech and language deficits following cerebral infarction: Secondary | ICD-10-CM

## 2021-01-12 DIAGNOSIS — Z823 Family history of stroke: Secondary | ICD-10-CM

## 2021-01-12 DIAGNOSIS — Z7982 Long term (current) use of aspirin: Secondary | ICD-10-CM

## 2021-01-12 DIAGNOSIS — I10 Essential (primary) hypertension: Secondary | ICD-10-CM

## 2021-01-12 DIAGNOSIS — Z7151 Drug abuse counseling and surveillance of drug abuser: Secondary | ICD-10-CM

## 2021-01-12 DIAGNOSIS — Z79899 Other long term (current) drug therapy: Secondary | ICD-10-CM

## 2021-01-12 DIAGNOSIS — Z720 Tobacco use: Secondary | ICD-10-CM | POA: Diagnosis not present

## 2021-01-12 DIAGNOSIS — M1711 Unilateral primary osteoarthritis, right knee: Secondary | ICD-10-CM | POA: Diagnosis present

## 2021-01-12 DIAGNOSIS — Z7902 Long term (current) use of antithrombotics/antiplatelets: Secondary | ICD-10-CM

## 2021-01-12 DIAGNOSIS — F1410 Cocaine abuse, uncomplicated: Secondary | ICD-10-CM

## 2021-01-12 DIAGNOSIS — I639 Cerebral infarction, unspecified: Secondary | ICD-10-CM

## 2021-01-12 DIAGNOSIS — R2689 Other abnormalities of gait and mobility: Secondary | ICD-10-CM | POA: Diagnosis present

## 2021-01-12 DIAGNOSIS — F1490 Cocaine use, unspecified, uncomplicated: Secondary | ICD-10-CM | POA: Diagnosis present

## 2021-01-12 DIAGNOSIS — F17210 Nicotine dependence, cigarettes, uncomplicated: Secondary | ICD-10-CM | POA: Diagnosis present

## 2021-01-12 DIAGNOSIS — I6330 Cerebral infarction due to thrombosis of unspecified cerebral artery: Secondary | ICD-10-CM

## 2021-01-12 LAB — CBC WITH DIFFERENTIAL/PLATELET
Abs Immature Granulocytes: 0.02 10*3/uL (ref 0.00–0.07)
Basophils Absolute: 0.1 10*3/uL (ref 0.0–0.1)
Basophils Relative: 1 %
Eosinophils Absolute: 0.2 10*3/uL (ref 0.0–0.5)
Eosinophils Relative: 3 %
HCT: 44.9 % (ref 39.0–52.0)
Hemoglobin: 15.4 g/dL (ref 13.0–17.0)
Immature Granulocytes: 0 %
Lymphocytes Relative: 30 %
Lymphs Abs: 2.9 10*3/uL (ref 0.7–4.0)
MCH: 30.2 pg (ref 26.0–34.0)
MCHC: 34.3 g/dL (ref 30.0–36.0)
MCV: 88 fL (ref 80.0–100.0)
Monocytes Absolute: 0.8 10*3/uL (ref 0.1–1.0)
Monocytes Relative: 8 %
Neutro Abs: 5.7 10*3/uL (ref 1.7–7.7)
Neutrophils Relative %: 58 %
Platelets: 281 10*3/uL (ref 150–400)
RBC: 5.1 MIL/uL (ref 4.22–5.81)
RDW: 11.9 % (ref 11.5–15.5)
WBC: 9.8 10*3/uL (ref 4.0–10.5)
nRBC: 0 % (ref 0.0–0.2)

## 2021-01-12 LAB — BASIC METABOLIC PANEL
Anion gap: 6 (ref 5–15)
BUN: 14 mg/dL (ref 6–20)
CO2: 25 mmol/L (ref 22–32)
Calcium: 9.2 mg/dL (ref 8.9–10.3)
Chloride: 103 mmol/L (ref 98–111)
Creatinine, Ser: 1.1 mg/dL (ref 0.61–1.24)
GFR, Estimated: >60 mL/min (ref >60)
Glucose, Bld: 104 mg/dL — ABNORMAL HIGH (ref 70–99)
Potassium: 4 mmol/L (ref 3.5–5.1)
Sodium: 134 mmol/L — ABNORMAL LOW (ref 135–145)

## 2021-01-12 MED ORDER — ATORVASTATIN CALCIUM 20 MG PO TABS: 20.0000 mg | ORAL_TABLET | Freq: Every day | ORAL | 1 refills | Status: DC

## 2021-01-12 MED ORDER — PROCHLORPERAZINE EDISYLATE 10 MG/2ML IJ SOLN: 5.0000 mg | Freq: Four times a day (QID) | INTRAMUSCULAR | Status: DC | PRN

## 2021-01-12 MED ORDER — TRAZODONE HCL 50 MG PO TABS: 25.0000 mg | ORAL_TABLET | Freq: Every evening | ORAL | Status: DC | PRN

## 2021-01-12 MED ORDER — PROCHLORPERAZINE MALEATE 5 MG PO TABS: 5.0000 mg | ORAL_TABLET | Freq: Four times a day (QID) | ORAL | Status: DC | PRN

## 2021-01-12 MED ORDER — ENOXAPARIN SODIUM 40 MG/0.4ML IJ SOSY
40.0000 mg | PREFILLED_SYRINGE | INTRAMUSCULAR | Status: DC
  Administered 2021-01-13 – 2021-01-20 (×8): 40 mg via SUBCUTANEOUS
  Filled 2021-01-12 (×8): qty 0.4

## 2021-01-12 MED ORDER — GUAIFENESIN-DM 100-10 MG/5ML PO SYRP
5.0000 mL | ORAL_SOLUTION | Freq: Four times a day (QID) | ORAL | Status: DC | PRN
  Filled 2021-01-12: qty 10

## 2021-01-12 MED ORDER — CLOPIDOGREL BISULFATE 75 MG PO TABS
75.0000 mg | ORAL_TABLET | Freq: Every day | ORAL | 0 refills | Status: DC
Start: 2021-01-13 — End: 2021-01-21

## 2021-01-12 MED ORDER — SENNOSIDES-DOCUSATE SODIUM 8.6-50 MG PO TABS: 1.0000 | ORAL_TABLET | Freq: Every evening | ORAL | Status: DC | PRN

## 2021-01-12 MED ORDER — POLYETHYLENE GLYCOL 3350 17 G PO PACK
17.0000 g | PACK | Freq: Every day | ORAL | Status: DC | PRN
  Administered 2021-01-16: 17 g via ORAL
  Filled 2021-01-12: qty 1

## 2021-01-12 MED ORDER — ASPIRIN 81 MG PO CHEW
81.0000 mg | CHEWABLE_TABLET | Freq: Every day | ORAL | 11 refills | Status: DC
Start: 2021-01-13 — End: 2021-01-21

## 2021-01-12 MED ORDER — CLOPIDOGREL BISULFATE 75 MG PO TABS
75.0000 mg | ORAL_TABLET | Freq: Every day | ORAL | Status: DC
  Administered 2021-01-13 – 2021-01-21 (×9): 75 mg via ORAL
  Filled 2021-01-12 (×9): qty 1

## 2021-01-12 MED ORDER — ATORVASTATIN CALCIUM 10 MG PO TABS
20.0000 mg | ORAL_TABLET | Freq: Every day | ORAL | Status: DC
  Administered 2021-01-13 – 2021-01-21 (×9): 20 mg via ORAL
  Filled 2021-01-12 (×9): qty 2

## 2021-01-12 MED ORDER — FLEET ENEMA 7-19 GM/118ML RE ENEM: 1.0000 | ENEMA | Freq: Once | RECTAL | Status: DC | PRN

## 2021-01-12 MED ORDER — ASPIRIN 81 MG PO CHEW
81.0000 mg | CHEWABLE_TABLET | Freq: Every day | ORAL | Status: DC
  Administered 2021-01-13 – 2021-01-21 (×9): 81 mg via ORAL
  Filled 2021-01-12 (×9): qty 1

## 2021-01-12 MED ORDER — DIPHENHYDRAMINE HCL 12.5 MG/5ML PO ELIX: 12.5000 mg | ORAL_SOLUTION | Freq: Four times a day (QID) | ORAL | Status: DC | PRN

## 2021-01-12 MED ORDER — ALUM & MAG HYDROXIDE-SIMETH 200-200-20 MG/5ML PO SUSP: 30.0000 mL | ORAL | Status: DC | PRN

## 2021-01-12 MED ORDER — ACETAMINOPHEN 325 MG PO TABS
325.0000 mg | ORAL_TABLET | ORAL | Status: DC | PRN
  Administered 2021-01-14 – 2021-01-15 (×2): 650 mg via ORAL
  Filled 2021-01-12 (×2): qty 2

## 2021-01-12 MED ORDER — BISACODYL 10 MG RE SUPP
10.0000 mg | Freq: Every day | RECTAL | Status: DC | PRN
  Administered 2021-01-16: 10 mg via RECTAL
  Filled 2021-01-12: qty 1

## 2021-01-12 MED ORDER — ACETAMINOPHEN 325 MG PO TABS: 650.0000 mg | ORAL_TABLET | ORAL | Status: DC | PRN

## 2021-01-12 MED ORDER — PROCHLORPERAZINE 25 MG RE SUPP
12.5000 mg | Freq: Four times a day (QID) | RECTAL | Status: DC | PRN
Start: 2021-01-12 — End: 2021-01-21

## 2021-01-12 NOTE — H&P (Signed)
Physical Medicine and Rehabilitation Admission H&P    CC: Functional deficits    HPI: Christopher [name] is a 55 year old LH-male with history of tobacco use and cocaine use otherwise in good health who was admitted on 01/10/2021 with one day history of slurred speech and left hemiparesis.  History taken from chart review and patient. 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 MRI brain done revealing 11 mm acute right thalamocapsular junction and 5 mm probable subacute infarct within the subcortical white matter in right frontal lobe.  Echocardiogram with ejection fraction of 55-60% and mild LVH. UDS positive for cocaine and THC. Dr. Roda Shutters felt that stroke likely due to cocaine use and recommended DAPT x3 weeks followed by ASA alone. Patient has been educated on importance of cessation/lifestyle changes. Therapy evaluations completed revealing mild dysarthria with unsteady gait and balance deficits. CIR recommended due to functional decline.  Please see preadmission assessment from earlier today as well.   Review of Systems  Constitutional:  Negative for chills and fever.  HENT:  Negative for hearing loss.   Eyes:  Negative for blurred vision and double vision.       Wears reading glasses  Respiratory:  Negative for cough and shortness of breath.   Cardiovascular:  Negative for chest pain and palpitations.  Gastrointestinal:  Negative for abdominal pain, nausea and vomiting.  Genitourinary:  Negative for dysuria and urgency.  Musculoskeletal:  Negative for back pain, joint pain, myalgias and neck pain.  Skin:  Negative for rash.  Neurological:  Positive for focal weakness and weakness. Negative for sensory change.  Psychiatric/Behavioral:  The patient does not have insomnia.   All other systems reviewed and are negative.   Past medical history: PUD complicated with fluid overload?--in 2008.   Past surgical history:  Thoracocentesis? --2008 during hospitalization for PUD.    Family History  Problem Relation Age of Onset   Cancer Mother    Stroke Father     Social History:  Divorced.  Has a room over garage (Boss's home)-->works as a Curator. Per  reports that he has been smoking cigarettes. He has been smoking an average of 1 PPD for about 40 years. He has quit using smokeless tobacco. He reports current alcohol use--3-5 drinks of 22 ounce of 8% alcohol/day.  He reports current drug use. Drug: "Crack" cocaine.   Allergies: No Known Allergies   Medications Prior to Admission  Medication Sig Dispense Refill   acetaminophen (TYLENOL) 325 MG tablet Take 2 tablets (650 mg total) by mouth every 4 (four) hours as needed for mild pain (or temp > 37.5 C (99.5 F)).     [START ON 01/13/2021] aspirin 81 MG chewable tablet Chew 1 tablet (81 mg total) by mouth daily. 30 tablet 11   [START ON 01/13/2021] atorvastatin (LIPITOR) 20 MG tablet Take 1 tablet (20 mg total) by mouth daily. 30 tablet 1   [START ON 01/13/2021] clopidogrel (PLAVIX) 75 MG tablet Take 1 tablet (75 mg total) by mouth daily. 21 tablet 0   senna-docusate (SENOKOT-S) 8.6-50 MG tablet Take 1 tablet by mouth at bedtime as needed for mild constipation.      Drug Regimen Review  Drug regimen was reviewed and remains appropriate with no significant issues identified  Home: Home Living Family/patient expects to be discharged to:: Private residence Living Arrangements: Non-relatives/Friends Available Help at Discharge: Available PRN/intermittently Type of Home: House Home Access: Stairs  to enter Entrance Stairs-Number of Steps: 2 Entrance Stairs-Rails: None Home Layout: One level Bathroom Shower/Tub: Health visitor: Standard Home Equipment: None  Lives With: Friend(s)   Functional History: Prior Function Level of Independence: Independent 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  Functional Status:   Mobility: Bed Mobility Overal bed mobility: Modified Independent Bed Mobility: Supine to Sit Supine to sit: Modified independent (Device/Increase time), HOB elevated Sit to supine: Supervision General bed mobility comments: 30 degrees HOB with assist from bedrails to scoot hips EOB Transfers Overall transfer level: Needs assistance Equipment used: 1 person hand held assist Transfers: Sit to/from Stand Sit to Stand: Min assist Stand pivot transfers: Min assist General transfer comment: min assist for safety with sit to stand Ambulation/Gait Ambulation/Gait assistance: Min assist Gait Distance (Feet): 50 Feet Assistive device: 1 person hand held assist Gait Pattern/deviations: Step-through pattern, Decreased step length - left General Gait Details: poor attention to left side when ambulation. Almost running into sink each time. Poor coordination of left and leaning to left with gait. Requires min assist to maintain balance. Gait velocity: decr    ADL: ADL Overall ADL's : Needs assistance/impaired Grooming: Wash/dry hands, Minimal assistance, Standing Upper Body Dressing : Supervision/safety, Sitting Lower Body Dressing: Minimal assistance, Sitting/lateral leans Functional mobility during ADLs: Minimal assistance General ADL Comments: HHA  Cognition: Cognition Overall Cognitive Status: Within Functional Limits for tasks assessed Orientation Level: Oriented X4 Attention: Focused, Sustained Focused Attention: Appears intact Sustained Attention: Appears intact Memory: Appears intact Cognition Arousal/Alertness: Awake/alert Behavior During Therapy: WFL for tasks assessed/performed Overall Cognitive Status: Within Functional Limits for tasks assessed Area of Impairment: Safety/judgement Safety/Judgement: Decreased awareness of safety, Decreased awareness of deficits General Comments: Follows one step commands accurately. A&O x4.   Blood pressure (!) 166/87, pulse 61,  temperature 98.1 F (36.7 C), temperature source Oral, resp. rate 16, weight 52.5 kg, SpO2 92 %. Physical Exam Vitals reviewed.  Constitutional:      General: He is not in acute distress.    Appearance: He is not ill-appearing.  HENT:     Head: Normocephalic and atraumatic.     Right Ear: External ear normal.     Left Ear: External ear normal.     Nose: Nose normal.  Eyes:     General:        Right eye: No discharge.        Left eye: No discharge.     Extraocular Movements: Extraocular movements intact.  Cardiovascular:     Rate and Rhythm: Normal rate and regular rhythm.  Pulmonary:     Effort: Pulmonary effort is normal. No respiratory distress.     Breath sounds: Normal breath sounds. No stridor.  Abdominal:     General: There is distension.     Comments: Hypoactive bowel sounds  Musculoskeletal:     Cervical back: Normal range of motion and neck supple.     Comments: No edema or tenderness in extremities  Skin:    General: Skin is warm and dry.     Comments: Irritation along beard hairline  Neurological:     Mental Status: He is alert.     Comments: Alert and oriented to person and place Motor: RUE/RLE: 5/5 proximal distally in LUE/LE: 4/5 proximal distal Dysarthria  Psychiatric:     Comments: Slightly delayed and slowed    Results for orders placed or performed during the hospital encounter of 01/10/21 (from the past 48 hour(s))  Hemoglobin A1c  Status: Abnormal   Collection Time: 01/11/21  1:09 AM  Result Value Ref Range   Hgb A1c MFr Bld 5.8 (H) 4.8 - 5.6 %    Comment: (NOTE) Pre diabetes:          5.7%-6.4%  Diabetes:              >6.4%  Glycemic control for   <7.0% adults with diabetes    Mean Plasma Glucose 119.76 mg/dL    Comment: Performed at [facility], [address] [place], [place] [address]  Lipid panel     Status: Abnormal   Collection Time: 01/11/21  1:09 AM  Result Value Ref Range   Cholesterol 102 0 - 200 mg/dL    Triglycerides 86 <191<150 mg/dL   HDL 25 (L) >47>40 mg/dL   Total CHOL/HDL Ratio 4.1 RATIO   VLDL 17 0 - 40 mg/dL   LDL Cholesterol 60 0 - 99 mg/dL    Comment:        Total Cholesterol/HDL:CHD Risk Coronary Heart Disease Risk Table                     Men   Women  1/2 Average Risk   3.4   3.3  Average Risk       5.0   4.4  2 X Average Risk   9.6   7.1  3 X Average Risk  23.4   11.0        Use the calculated Patient Ratio above and the CHD Risk Table to determine the patient's CHD Risk.        ATP III CLASSIFICATION (LDL):  <100     mg/dL   Optimal  [id number]  mg/dL   Near or Above                    Optimal  130-159  mg/dL   Borderline  [id number]  mg/dL   High  >784>190     mg/dL   Very High Performed at [facility], [address] [place], [place] [address]   Basic metabolic panel     Status: Abnormal   Collection Time: 01/11/21  1:09 AM  Result Value Ref Range   Sodium 134 (L) 135 - 145 mmol/L   Potassium 3.6 3.5 - 5.1 mmol/L   Chloride 99 98 - 111 mmol/L   CO2 27 22 - 32 mmol/L   Glucose, Bld 114 (H) 70 - 99 mg/dL    Comment: Glucose reference range applies only to samples taken after fasting for at least 8 hours.   BUN 12 6 - 20 mg/dL   Creatinine, Ser 5.281.20 0.61 - 1.24 mg/dL   Calcium 8.6 (L) 8.9 - 10.3 mg/dL   GFR, Estimated >41>60 >32>60 mL/min    Comment: (NOTE) Calculated using the CKD-EPI Creatinine Equation (2021)    Anion gap 8 5 - 15    Comment: Performed at [facility], [address] [place], [place] [address]  CBC     Status: Abnormal   Collection Time: 01/11/21  1:09 AM  Result Value Ref Range   WBC 10.9 (H) 4.0 - 10.5 K/uL   RBC 4.87 4.22 - 5.81 MIL/uL   Hemoglobin 14.4 13.0 - 17.0 g/dL   HCT 27.242.9 53.639.0 - 64.452.0 %   MCV 88.1 80.0 - 100.0 fL   MCH 29.6 26.0 - 34.0 pg   MCHC 33.6 30.0 - 36.0 g/dL   RDW  11.7 11.5 - 15.5 %   Platelets 275 150 - 400 K/uL   nRBC 0.0 0.0 - 0.2 %    Comment: Performed at [facility], [address], [place],  [place] [address]  MRSA Next Gen by PCR, Nasal     Status: None   Collection Time: 01/11/21 12:03 PM   Specimen: Nasal Mucosa; Nasal Swab  Result Value Ref Range   MRSA by PCR Next Gen NOT DETECTED NOT DETECTED    Comment: (NOTE) The GeneXpert MRSA Assay (FDA approved for NASAL specimens only), is one component of a comprehensive MRSA colonization surveillance program. It is not intended to diagnose MRSA infection nor to guide or monitor treatment for MRSA infections. Test performance is not FDA approved in patients less than 77 years old. Performed at [facility], [address] [place], [place] [address]   Basic metabolic panel     Status: Abnormal   Collection Time: 01/12/21  2:06 AM  Result Value Ref Range   Sodium 134 (L) 135 - 145 mmol/L   Potassium 4.0 3.5 - 5.1 mmol/L   Chloride 103 98 - 111 mmol/L   CO2 25 22 - 32 mmol/L   Glucose, Bld 104 (H) 70 - 99 mg/dL    Comment: Glucose reference range applies only to samples taken after fasting for at least 8 hours.   BUN 14 6 - 20 mg/dL   Creatinine, Ser 9.21 0.61 - 1.24 mg/dL   Calcium 9.2 8.9 - 19.4 mg/dL   GFR, Estimated >17 >40 mL/min    Comment: (NOTE) Calculated using the CKD-EPI Creatinine Equation (2021)    Anion gap 6 5 - 15    Comment: Performed at [facility], [address], [place], [place] [address]  CBC with Differential/Platelet     Status: None   Collection Time: 01/12/21  2:06 AM  Result Value Ref Range   WBC 9.8 4.0 - 10.5 K/uL   RBC 5.10 4.22 - 5.81 MIL/uL   Hemoglobin 15.4 13.0 - 17.0 g/dL   HCT 18.5 63.1 - 49.7 %   MCV 88.0 80.0 - 100.0 fL   MCH 30.2 26.0 - 34.0 pg   MCHC 34.3 30.0 - 36.0 g/dL   RDW 02.6 37.8 - 58.8 %   Platelets 281 150 - 400 K/uL   nRBC 0.0 0.0 - 0.2 %   Neutrophils Relative % 58 %   Neutro Abs 5.7 1.7 - 7.7 K/uL   Lymphocytes Relative 30 %   Lymphs Abs 2.9 0.7 - 4.0 K/uL   Monocytes Relative 8 %   Monocytes Absolute 0.8 0.1 - 1.0 K/uL   Eosinophils Relative 3 %    Eosinophils Absolute 0.2 0.0 - 0.5 K/uL   Basophils Relative 1 %   Basophils Absolute 0.1 0.0 - 0.1 K/uL   Immature Granulocytes 0 %   Abs Immature Granulocytes 0.02 0.00 - 0.07 K/uL    Comment: Performed at [facility], [address] [place], [place] [address]   CT ANGIO HEAD NECK W WO CM  Result Date: 01/10/2021 CLINICAL DATA:  Follow-up examination for acute stroke. EXAM: CT ANGIOGRAPHY HEAD AND NECK TECHNIQUE: Multidetector CT imaging of the head and neck was performed using the standard protocol during bolus administration of intravenous contrast. Multiplanar CT image reconstructions and MIPs were obtained to evaluate the vascular anatomy. Carotid stenosis measurements (when applicable) are obtained utilizing NASCET criteria, using the distal internal carotid diameter as the denominator. CONTRAST:  39mL OMNIPAQUE IOHEXOL 350 MG/ML SOLN COMPARISON:  Prior CTA and MRI from earlier the same day. FINDINGS: CTA NECK FINDINGS Aortic arch: Visualized aortic arch normal in caliber with normal branch pattern. Mild atheromatous change about the arch and origin of the great vessels without hemodynamically significant stenosis. Right carotid system: Scattered atheromatous irregularity about the right common and internal carotid arteries without hemodynamically significant stenosis. No dissection or vascular occlusion. Left carotid system: Scattered atheromatous irregularity about the left common and internal carotid arteries without hemodynamically significant stenosis. No dissection or vascular occlusion. Vertebral arteries: Both vertebral arteries arise from the subclavian arteries. No proximal subclavian artery stenosis. Atheromatous change at the origins of both vertebral arteries with associated moderate stenosis on the left and severe stenosis on the right. Vertebral arteries otherwise patent distally without stenosis, dissection or occlusion. Skeleton: No visible acute osseous finding. No discrete  or worrisome osseous lesions. Reversal of the normal cervical lordosis with grade 1 anterolisthesis of C3 on C4 and C4 on C5, with trace retrolisthesis of C5 on C6 and C6 on C7. Moderate spondylosis present at C5-6 and C6-7. Other neck: No other acute soft tissue abnormality within the neck. No mass or adenopathy. Punctate sialolith noted within the right parotid gland. Upper chest: Emphysema with scattered bronchiectatic changes. Visualized upper chest demonstrates no other acute finding. Review of the MIP images confirms the above findings CTA HEAD FINDINGS Anterior circulation: Both internal carotid arteries patent to the termini without hemodynamically significant stenosis. A1 segments patent bilaterally. Normal anterior communicating artery complex. Anterior cerebral arteries patent to their distal aspects without stenosis. No M1 stenosis or occlusion. Normal MCA bifurcations. No proximal MCA branch occlusion. Distal MCA branches well perfused and symmetric. Posterior circulation: Both vertebral arteries patent to the vertebrobasilar junction without stenosis. Left vertebral artery slightly dominant. Both PICA origins patent and normal. Basilar patent to its distal aspect without stenosis. Superior cerebellar arteries patent bilaterally. Right PCA supplied via the basilar. Fetal type origin of the left PCA. Atheromatous irregularity within the right greater than left PCAs without hemodynamically significant stenosis. Venous sinuses: Patent allowing for timing the contrast bolus. Anatomic variants: Fetal type origin of the left PCA. No intracranial aneurysm. Review of the MIP images confirms the above findings IMPRESSION: 1. Negative CTA for large vessel occlusion. 2. 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 3. Additional mild-to-moderate atheromatous irregularity elsewhere about the major arterial vasculature of the  head and neck. No other hemodynamically significant or correctable stenosis. 4. Emphysema (ICD10-J43.9). Electronically Signed   By: Rise Mu M.D.   On: 01/10/2021 23:00       Medical Problem List and Plan: 1.  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  -patient may shower  -ELOS/Goals: 13-16 days/Supervision/min a  Admit to CIR 2.  Antithrombotics: -DVT/anticoagulation:  Pharmaceutical: Lovenox  -antiplatelet therapy: DAPT X 3 weeks followed by ASA alone.  3. Pain Management: Tylenol prn.  4. Mood: LCSW to follow for evaluation and support.   -antipsychotic agents: N/A 5. Neuropsych: This patient is not capable of making decisions on his own behalf. 6. Skin/Wound Care: Routine pressure relief measures.  7. Fluids/Electrolytes/Nutrition: Monitor I/Os  CMP ordered for tomorrow a.m. 8. HTN: Monitor BP TID w/permissive HTN for 5-7 days to  normalize OK for <220/120 (avoid hypoperfusion)   Monitor  his increase mobility 9. Hyperlipidemia: LDL at [facility] but HDL<265. Now on low dose statin- Lipitor 20 mg/day 10. Polysubstance abuse:   Counseled on appropriate 11. Leucocytosis: Resolving-->monitor for signs of infection.   CBC ordered for tomorrow. 12. Hyponatremia:   CMP ordered for tomorrow.   Jacquelynn Cree, PA-C 01/12/2021  I have personally performed a face to face diagnostic evaluation, including, but not limited to relevant history and physical exam findings, of this patient and developed relevant assessment and plan.  Additionally, I have reviewed and concur with the physician assistant's documentation above.  Maryla Morrow, MD, ABPMR  The patient's status has not changed. Any changes from the pre-admission screening or documentation from the acute chart are noted above.   Maryla Morrow, MD, ABPMR

## 2021-01-12 NOTE — Progress Notes (Signed)
Patient ID: Christopher [name], male   DOB: 07-Mar-1966, 55 y.o.   MRN: [id number] Met with the patient to introduce self and review role of the nurse CM. Discussed situation and therapy schedule with plan of care. Patient noted he was willing to give up smoking and substance use. 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  Reviewed medications and dietary modifications for [facility] diet. Continue to follow along to discharge to address educational needs and collaborate with the SW to facilitate preparation for discharge. Christopher [name]

## 2021-01-12 NOTE — PMR Pre-admission (Signed)
PMR Admission Coordinator Pre-Admission Assessment  Patient: Christopher [name] is an 55 y.o., male MRN: [id number] DOB: January 09, 1966 Height:   Weight: 52.5 kg  Insurance Information HMO:     PPO:      PCP:      IPA:      80/20:      OTHER:  PRIMARY: uninsured      Policy#:       Subscriber:  CM Name:       Phone#:      Fax#:  Pre-Cert#:       Employer:  Benefits:  Phone #:      Name:  Eff. Date:      Deduct:       Out of Pocket Max:       Life Max:  CIR:       SNF:  Outpatient:      Co-Pay:  Home Health:       Co-Pay:  DME:      Co-Pay:  Providers:  SECONDARY:       Policy#:      Phone#:   Development worker, community:       Phone#:   The Engineer, petroleum" for patients in Inpatient Rehabilitation Facilities with attached "Privacy Act [place] Records" was provided and verbally reviewed with: N/A  Emergency Contact Information Contact Information     Name Relation Home Work Mobile   [place] Sister   [phone]       Current Medical History  Patient Admitting Diagnosis: L thalamic/caudate head/basal ganglia CVA  History of Present Illness: pt is a 55 y/o male with PMH of  HTN, tobacco use and cocaine abuse who presented to [facility] on 01/10/21 with c/o R sided weakness and slurred speech.  MRI revealed acute lacunar infarct int he thalamocapsular junction and chronic lacunar infarcts in the bilateral basal ganglia and left thalamus.  Stroke team was consulted and full workup revealed EF 55-60%, A1c 5.8, LDL 60.  UDS positive for cocaine and THC.  Felt stroke etiology likely due to small vessel disease.  Recommendations for smoking/cocaine cessation, and DAPT asa/plavix x3 weeks, then asa alone.  Start low-dose statin for stroke prevention.  Therapy evaluations were completed and pt was recommended for CIR.   Complete NIHSS TOTAL: 3  Patient's medical record from [facility] has been reviewed by the rehabilitation admission coordinator and  physician.  Past Medical History  History reviewed. No pertinent past medical history.  Has the patient had major surgery during 100 days prior to admission? No  Family History   family history is not on file.  Current Medications  Current Facility-Administered Medications:    acetaminophen (TYLENOL) tablet 650 mg, 650 mg, Oral, Q4H PRN **OR** acetaminophen (TYLENOL) 160 MG/5ML solution 650 mg, 650 mg, Per Tube, Q4H PRN **OR** acetaminophen (TYLENOL) suppository 650 mg, 650 mg, Rectal, Q4H PRN, Orma Flaming, MD   aspirin chewable tablet 81 mg, 81 mg, Oral, Daily, Orma Flaming, MD, 81 mg at 01/12/21 0950   atorvastatin (LIPITOR) tablet 20 mg, 20 mg, Oral, Daily, Rosalin Hawking, MD, 20 mg at 01/12/21 0950   clopidogrel (PLAVIX) tablet 75 mg, 75 mg, Oral, Daily, Rosalin Hawking, MD, 75 mg at 01/12/21 0950   enoxaparin (LOVENOX) injection 40 mg, 40 mg, Subcutaneous, Q24H, Orma Flaming, MD, 40 mg at 01/11/21 1229   hydrALAZINE (APRESOLINE) injection 5 mg, 5 mg, Intravenous, Q6H PRN, Orma Flaming, MD   senna-docusate (Senokot-S) tablet 1 tablet, 1 tablet, Oral, QHS PRN,  Orma Flaming, MD  Patients Current Diet:  Diet Order             Diet Heart Room service appropriate? Yes; Fluid consistency: Thin  Diet effective now                   Precautions / Restrictions Precautions Precautions: Fall Precaution Comments: left weakness Restrictions Weight Bearing Restrictions: No   Has the patient had 2 or more falls or a fall with injury in the past year? No  Prior Activity Level Community (5-7x/wk): worked as a Dealer prior to admission, doesn't drive, no DME used prior to admit  Prior Functional Level Self Care: Did the patient need help bathing, dressing, using the toilet or eating? Independent  Indoor Mobility: Did the patient need assistance with walking from room to room (with or without device)? Independent  Stairs: Did the patient need assistance with internal or  external stairs (with or without device)? Independent  Functional Cognition: Did the patient need help planning regular tasks such as shopping or remembering to take medications? Independent  Patient Information Are you of Hispanic, Latino/a,or Spanish origin?: A. No, not of Hispanic, Latino/a, or Spanish origin What is your race?: A. White Do you need or want an interpreter to communicate with a doctor or health care staff?: 0. No  Patient's Response To:  Health Literacy and Transportation Is the patient able to respond to health literacy and transportation needs?: Yes Health Literacy - How often do you need to have someone help you when you read instructions, pamphlets, or other written material from your doctor or pharmacy?: Never In the past 12 months, has lack of transportation kept you from medical appointments or from getting medications?: No In the past 12 months, has lack of transportation kept you from meetings, work, or from getting things needed for daily living?: No  Development worker, international aid / [place] Devices/Equipment: None Home Equipment: None  Prior Device Use: Indicate devices/aids used by the patient prior to current illness, exacerbation or injury? None of the above  Current Functional Level Cognition  Overall Cognitive Status: Within Functional Limits for tasks assessed Orientation Level: Oriented X4 Safety/Judgement: Decreased awareness of safety, Decreased awareness of deficits General Comments: Follows one step commands accurately. A&O x4. Attention: Focused, Sustained Focused Attention: Appears intact Sustained Attention: Appears intact Memory: Appears intact    Extremity Assessment (includes Sensation/Coordination)  Upper Extremity Assessment: LUE deficits/detail LUE Deficits / Details: 3/5 elbow flexion, 3/5 grip strength, 3/5 shoulder flexion LUE Sensation: WNL LUE Coordination: WNL  Lower Extremity Assessment: LLE deficits/detail, RLE  deficits/detail RLE Deficits / Details: 5/5 knee extension/flexion and hip flexion LLE Deficits / Details: 4/5 knee extension, 3+/5 knee flexion, hip flexion 3+/5    ADLs  Overall ADL's : Needs assistance/impaired Grooming: Wash/dry hands, Minimal assistance, Standing Upper Body Dressing : Supervision/safety, Sitting Lower Body Dressing: Minimal assistance, Sitting/lateral leans Functional mobility during ADLs: Minimal assistance General ADL Comments: HHA    Mobility  Overal bed mobility: Modified Independent Bed Mobility: Supine to Sit Supine to sit: Modified independent (Device/Increase time), HOB elevated Sit to supine: Supervision General bed mobility comments: 30 degrees HOB with assist from bedrails to scoot hips EOB    Transfers  Overall transfer level: Needs assistance Equipment used: 1 person hand held assist Transfers: Sit to/from Stand Sit to Stand: Min assist Stand pivot transfers: Min assist General transfer comment: min assist for safety with sit to stand    Ambulation / Gait /  Stairs / Emergency planning/management officer  Ambulation/Gait Ambulation/Gait assistance: Herbalist (Feet): 50 Feet Assistive device: 1 person hand held assist Gait Pattern/deviations: Step-through pattern, Decreased step length - left General Gait Details: poor attention to left side when ambulation. Almost running into sink each time. Poor coordination of left and leaning to left with gait. Requires min assist to maintain balance. Gait velocity: decr    Posture / Balance Dynamic Sitting Balance Sitting balance - Comments: Intermittent RUE support on bedrails Balance Overall balance assessment: Needs assistance Sitting-balance support: Feet supported Sitting balance-Leahy Scale: Fair Sitting balance - Comments: Intermittent RUE support on bedrails Postural control: Posterior lean Standing balance support: Single extremity supported, During functional activity Standing balance-Leahy  Scale: Poor Standing balance comment: Requires external and RUE support for ambulation    Special needs/care consideration N/A   Previous Home Environment (from acute therapy documentation) Living Arrangements: Non-relatives/Friends  Lives With: Friend(s) Available Help at Discharge: Available PRN/intermittently Type of Home: House Home Layout: One level Home Access: Stairs to enter Entrance Stairs-Rails: None Technical brewer of Steps: 2 Bathroom Shower/Tub: Multimedia programmer: Reed: No  Discharge Living Setting Plans for Discharge Living Setting: Lives with (comment) (boss, Daryl) Type of Home at Discharge: Apartment (garage apartment) Discharge Home Layout: One level Discharge Home Access: Level entry Discharge Bathroom Shower/Tub: Tub/shower unit Discharge Bathroom Toilet: Standard Discharge Bathroom Accessibility: Yes How Accessible: Accessible via walker Does the patient have any problems obtaining your medications?: No  Social/Family/Support Systems Anticipated Caregiver: plan to d/c home to Daryl's house (garage apartment), with intermittent assist from family and friends, main contact is Chiropodist (sister) Anticipated Caregiver's Contact Information: Jackelyn Poling [phone] Caregiver Availability: Intermittent Discharge Plan Discussed with Primary Caregiver: Yes Is Caregiver In Agreement with Plan?: Yes Does Caregiver/Family have Issues with Lodging/Transportation while Pt is in Rehab?: No  Goals Patient/Family Goal for Rehab: PT/OT supervision to mod I, SLP mod I Expected length of stay: 12-14 days Pt/Family Agrees to Admission and willing to participate: Yes Program Orientation Provided & Reviewed with Pt/Caregiver Including Roles  & Responsibilities: Yes  Barriers to Discharge: Decreased caregiver support, Insurance for SNF coverage  Decrease burden of Care through IP rehab admission: n/a  Possible need for SNF  placement upon discharge: Not anticipated.   Patient Condition: I have reviewed medical records from [facility], spoken with CM, and patient and family member. I met with patient at the bedside and discussed via phone for inpatient rehabilitation assessment.  Patient will benefit from ongoing PT, OT, and SLP, can actively participate in 3 hours of therapy a day 5 days of the week, and can make measurable gains during the admission.  Patient will also benefit from the coordinated team approach during an Inpatient Acute Rehabilitation admission.  The patient will receive intensive therapy as well as Rehabilitation physician, nursing, social worker, and care management interventions.  Due to safety, disease management, medication administration, pain management, and patient education the patient requires 24 hour a day rehabilitation nursing.  The patient is currently min assist with mobility and basic ADLs.  Discharge setting and therapy post discharge at home with home health is anticipated.  Patient has agreed to participate in the Acute Inpatient Rehabilitation Program and will admit today.  Preadmission Screen Completed By:  Michel Santee, PT, DPT 01/12/2021 11:18 AM ______________________________________________________________________   Discussed status with Dr. Posey Pronto on 01/12/21  at 11:20 AM  and received approval for admission today.  Admission Coordinator:  Earnest Conroy  Cletus Gash, PT, DPT time 11:20 AM Sudie Grumbling 01/12/21    Assessment/Plan: Diagnosis: L thalamic/caudate head/basal ganglia CVA  Does the need for close, 24 hr/day Medical supervision in concert with the patient's rehab needs make it unreasonable for this patient to be served in a less intensive setting? Yes Co-Morbidities requiring supervision/potential complications: HTN (monitor and provide prns in accordance with increased physical exertion and pain), tobacco use and cocaine abuse  Due to safety, disease management, medication  administration, and patient education, does the patient require 24 hr/day rehab nursing? Yes Does the patient require coordinated care of a physician, rehab nurse, PT, OT, and SLP to address physical and functional deficits in the context of the above medical diagnosis(es)? Yes Addressing deficits in the following areas: balance, endurance, locomotion, strength, transferring, bathing, dressing, toileting, cognition, and psychosocial support Can the patient actively participate in an intensive therapy program of at least 3 hrs of therapy 5 days a week? Yes The potential for patient to make measurable gains while on inpatient rehab is excellent Anticipated functional outcomes upon discharge from inpatient rehab: modified independent and supervision PT, modified independent and supervision OT, modified independent SLP Estimated rehab length of stay to reach the above functional goals is: 7-11 days.  Anticipated discharge destination: Home 10. Overall Rehab/Functional Prognosis: good   MD Signature: Delice Lesch, MD, ABPMR

## 2021-01-12 NOTE — Progress Notes (Signed)
Inpatient Rehabilitation Medication Review by a Pharmacist  A complete drug regimen review was completed for this patient to identify any potential clinically significant medication issues.  High Risk Drug Classes Is patient taking? Indication by Medication  Antipsychotic Yes Compazine for N/V  Anticoagulant Yes Enoxaparin for VTE prophylaxis  Antibiotic No   Opioid No   Antiplatelet Yes Plavix, ASA for stroke  Hypoglycemics/insulin No   Vasoactive Medication No   Chemotherapy No   Other No      Type of Medication Issue Identified Description of Issue Recommendation(s)  Drug Interaction(s) (clinically significant)     Duplicate Therapy     Allergy     No Medication Administration End Date     Incorrect Dose     Additional Drug Therapy Needed     Significant med changes from prior encounter (inform family/care partners about these prior to discharge). N/A   Other       Clinically significant medication issues were identified that warrant physician communication and completion of prescribed/recommended actions by midnight of the next day:  No  Name of provider notified for urgent issues identified:  N/A  Time spent performing this drug regimen review (minutes):   15  Vicki Mallet, PharmD, BCPS, [facility] Clinical Pharmacist 01/12/2021 2:51 PM

## 2021-01-12 NOTE — Progress Notes (Addendum)
Report given to [facility]. Pt will be transferring to 5C08.

## 2021-01-12 NOTE — Discharge Summary (Signed)
Physician Discharge Summary  Christopher [name]:[id number] DOB: [birth year] DOA: 01/10/2021  PCP: Patient, No Pcp Per (Inactive)  Admit date: 01/10/2021 Discharge date: 01/12/2021  Admitted From: Home Disposition: CIR  Recommendations for Outpatient Follow-up:  Follow up with PCP in 1-2 weeks Follow up with Neurology within 1-2 weeks Please obtain CMP/CBC, Mag, Phos in one week Please follow up on the following pending results:  Home Health: No Equipment/Devices: None    Discharge Condition: Stable CODE STATUS: FULL CODE  Diet recommendation: Heart Healthy Carb Modified Diet   Brief/Interim Summary: The patient is a 55 year old disheveled Caucasian male with a past medical history significant for but not limited to tobacco abuse who smokes 1 pack/day and history of cocaine abuse who used cocaine 3 days prior to admission who presented with a 1 day history of slurred speech and significant left-sided weakness.  Concern was for stroke and patient was hospitalized for further management.  Further work-up was done and an neurology evaluated and recommending aspirin and Plavix for 3 weeks and then just aspirin alone daily.  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 Cocaine Use  -MRI done and showed right-sided stroke and patient does have left-sided weakness though it was initially appeared that he had to be evaluated for right-sided weakness -CTA head and neck done and showed no significant stenosis -Echocardiogram done and showed normal systolic function and grade 1 diastolic dysfunction -Lipid Panel  showed Total Cholesterol/HDL Ratio of 4.1, Cholesterol of 102, HDL of 25, LDL of 60, TG of 86 and VLDL of 17 -HbA1c was 5.8 -Neurology Recommending Lifestyle Modifications and Cocaine and Smoking Cessation -PT OT and SLP evaluation obtained and they are recommending CIR -Neurology recommending DAPT with ASA 81 mg po Daily and Clopidogrel 75 mg po Daily x3 weeks and then ASA alone -Neurology also recommending Atorvastatin 20 mg po Daily for Stroke Prevention    Polysubstance Abuse -UDS was positive for both cocaine and marijuana -Counseling was given   AKI -Improving -Baseline Cr is around 0.9 -Patient's Cr on Admission was 1.28 -BUN/Cr has improved to 14/1.10 -Avoid nephrotoxic medications, contrast dyes, hypotension and renally adjust medications -Repeat CMP within 1 week   Hyponatremia -Mild. Na+ was 134 -Continue to Monitor and Trend -Repeat CMP within 1 week   HTN/Elevated BP -Apparently never been officially diagnosed but likely has longstanding untreated hypertension.  Blood pressure is also  elevated in the setting of cocaine use.   -BP elevated but Neurology recommending Permissive HTN and gradually normalizing in 5-7 Days -Continue to Monitor BP per protocol -Last BP was  -Follow up with PCP at D/C   Pre-Diabetes -HbA1c was 5.8 -Dietary Counseling and Lifestyle modification counseling given  -Continue to Monitor CBG's closely and if necessary initiate SSI   Leukocytosis -Resolved and was likely reactive -Patient's WBC went from 14.1 -> 10.9 -> 9.8 -Continue to Monitor and Trend and repeat CMP within 1 week    Tobacco Abuse -Smoking Cessationa counseling given -Patient has declined Nicotine Patch   Discharge Instructions  Discharge Instructions     Ambulatory referral to Neurology  Complete by: As directed    Follow up with stroke clinic NP (Jessica Ridge or Darrol Angel, if both not available, consider Manson Allan, or Ahern) at [facility] in about 4  weeks. Thanks.   Call MD for:  difficulty breathing, headache or visual disturbances   Complete by: As directed    Call MD for:  extreme fatigue   Complete by: As directed    Call MD for:  hives   Complete by: As directed    Call MD for:  persistant dizziness or light-headedness   Complete by: As directed    Call MD for:  persistant nausea and vomiting   Complete by: As directed    Call MD for:  redness, tenderness, or signs of infection (pain, swelling, redness, odor or green/yellow discharge around incision site)   Complete by: As directed    Call MD for:  severe uncontrolled pain   Complete by: As directed    Call MD for:  temperature >100.4   Complete by: As directed    Diet - low sodium heart healthy   Complete by: As directed    Diet Carb Modified   Complete by: As directed    Discharge instructions   Complete by: As directed    You were cared for by a hospitalist during your hospital stay. If you have any questions about your discharge medications or the care you received while you were in the hospital after you are discharged, you can call the unit and ask to speak with the hospitalist on call if the hospitalist that took care of you is not available. Once you are discharged, your primary care physician will handle any further medical issues. Please note that NO REFILLS for any discharge medications will be authorized once you are discharged, as it is imperative that you return to your primary care physician (or establish a relationship with a primary care physician if you do not have one) for your aftercare needs so that they can reassess your need for medications and monitor your lab values.  Follow up with PCP and Neurology within 1-2 weeks. Take all medications as prescribed. If symptoms change or worsen please return to the ED for evaluation   Increase activity slowly   Complete by: As directed       Allergies as of 01/12/2021   No Known Allergies      Medication List      STOP taking these medications    mupirocin cream 2 % Commonly known as: BACTROBAN       TAKE these medications    acetaminophen 325 MG tablet Commonly known as: TYLENOL Take 2 tablets (650 mg total) by mouth every 4 (four) hours as needed for mild pain (or temp > 37.5 C (99.5 F)).   aspirin 81 MG chewable tablet Chew 1 tablet (81 mg total) by mouth daily. Start taking on: January 13, 2021   atorvastatin 20 MG tablet Commonly known as: LIPITOR Take 1 tablet (20 mg total) by mouth daily. Start taking on: January 13, 2021   clopidogrel 75 MG tablet Commonly known as: PLAVIX Take 1 tablet (75 mg total) by mouth daily. Start taking on: January 13, 2021   senna-docusate 8.6-50 MG tablet Commonly known as: Senokot-S Take 1 tablet by mouth at bedtime as needed for mild constipation.        Follow-up Information     [facility]. Schedule an appointment as soon as possible for a visit in 1 month(s).  Specialty: Neurology Why: stroke clinic Contact information: [address] [place] [address] [phone]               No Known Allergies  Consultations: Neurology  Procedures/Studies: CT ANGIO HEAD NECK W WO CM  Result Date: 01/10/2021 CLINICAL DATA:  Follow-up examination for acute stroke. EXAM: CT ANGIOGRAPHY HEAD AND NECK TECHNIQUE: Multidetector CT imaging of the head and neck was performed using the standard protocol during bolus administration of intravenous contrast. Multiplanar CT image reconstructions and MIPs were obtained to evaluate the vascular anatomy. Carotid stenosis measurements (when applicable) are obtained utilizing NASCET criteria, using the distal internal carotid diameter as the denominator. CONTRAST:  54mL OMNIPAQUE IOHEXOL 350 MG/ML SOLN COMPARISON:  Prior CTA and MRI from earlier the same day. FINDINGS: CTA NECK FINDINGS Aortic arch: Visualized aortic arch normal in caliber with normal  branch pattern. Mild atheromatous change about the arch and origin of the great vessels without hemodynamically significant stenosis. Right carotid system: Scattered atheromatous irregularity about the right common and internal carotid arteries without hemodynamically significant stenosis. No dissection or vascular occlusion. Left carotid system: Scattered atheromatous irregularity about the left common and internal carotid arteries without hemodynamically significant stenosis. No dissection or vascular occlusion. Vertebral arteries: Both vertebral arteries arise from the subclavian arteries. No proximal subclavian artery stenosis. Atheromatous change at the origins of both vertebral arteries with associated moderate stenosis on the left and severe stenosis on the right. Vertebral arteries otherwise patent distally without stenosis, dissection or occlusion. Skeleton: No visible acute osseous finding. No discrete or worrisome osseous lesions. Reversal of the normal cervical lordosis with grade 1 anterolisthesis of C3 on C4 and C4 on C5, with trace retrolisthesis of C5 on C6 and C6 on C7. Moderate spondylosis present at C5-6 and C6-7. Other neck: No other acute soft tissue abnormality within the neck. No mass or adenopathy. Punctate sialolith noted within the right parotid gland. Upper chest: Emphysema with scattered bronchiectatic changes. Visualized upper chest demonstrates no other acute finding. Review of the MIP images confirms the above findings CTA HEAD FINDINGS Anterior circulation: Both internal carotid arteries patent to the termini without hemodynamically significant stenosis. A1 segments patent bilaterally. Normal anterior communicating artery complex. Anterior cerebral arteries patent to their distal aspects without stenosis. No M1 stenosis or occlusion. Normal MCA bifurcations. No proximal MCA branch occlusion. Distal MCA branches well perfused and symmetric. Posterior circulation: Both vertebral  arteries patent to the vertebrobasilar junction without stenosis. Left vertebral artery slightly dominant. Both PICA origins patent and normal. Basilar patent to its distal aspect without stenosis. Superior cerebellar arteries patent bilaterally. Right PCA supplied via the basilar. Fetal type origin of the left PCA. Atheromatous irregularity within the right greater than left PCAs without hemodynamically significant stenosis. Venous sinuses: Patent allowing for timing the contrast bolus. Anatomic variants: Fetal type origin of the left PCA. No intracranial aneurysm. Review of the MIP images confirms the above findings IMPRESSION: 1. Negative CTA for large vessel occlusion. 2. 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 3. Additional mild-to-moderate atheromatous irregularity elsewhere about the major arterial vasculature of the head and neck. No other hemodynamically significant or correctable stenosis. 4. Emphysema (ICD10-J43.9). Electronically Signed   By: Rise Mu M.D.   On: 01/10/2021 23:00   CT HEAD WO CONTRAST  Result Date: 01/10/2021 CLINICAL DATA:  Right upper extremity weakness.  Unsteady gait. EXAM: CT HEAD WITHOUT CONTRAST TECHNIQUE:  Contiguous axial images were obtained from the base of the skull through the vertex without intravenous contrast. COMPARISON:  None. FINDINGS: Brain: Age indeterminate lacunar infarct present in the lateral left thalamus. Lacunar infarcts involving the right caudate head and right putamen appear more remote. Periventricular white matter changes noted bilaterally. Additional subcortical white matter hypoattenuation scattered bilaterally. Areas of cortical hypoattenuation noted at the exophytic poles, left greater than right. No other focal cortical abnormality is present. Brainstem and cerebellum are normal. No acute hemorrhage or mass lesion is present. The ventricles  are of normal size. No significant extraaxial fluid collection is present. Vascular: No hyperdense vessel or unexpected calcification. Skull: Calvarium is intact. No focal lytic or blastic lesions are present. No significant extracranial soft tissue lesion is present. Sinuses/Orbits: The paranasal sinuses and mastoid air cells are clear. The globes and orbits are within normal limits. IMPRESSION: 1. Age indeterminate lacunar infarct in the lateral left thalamus. 2. Lacunar infarcts involving the right caudate head and right putamen appear more remote. 3. Areas of cortical hypoattenuation at the exophytic poles, left greater than right. These may represent acute or subacute infarcts. MRI could be used for further evaluation if clinically indicated. 4. Atrophy and white matter disease likely reflects the sequela of chronic microvascular ischemia. Electronically Signed   By: Marin Roberts M.D.   On: 01/10/2021 11:24   MR BRAIN WO CONTRAST  Result Date: 01/10/2021 CLINICAL DATA:  Neuro deficit, acute, stroke suspected. EXAM: MRI HEAD WITHOUT CONTRAST TECHNIQUE: Multiplanar, multiecho pulse sequences of the brain and surrounding structures were obtained without intravenous contrast. COMPARISON:  Head CT 01/10/2021. FINDINGS: Brain: Mild generalized cerebral and cerebellar atrophy. 11 mm acute infarct within the right thalamocapsular junction (for instance as seen on series 5, image 74). 5 mm focus of diffusion weighted hyperintensity (with corresponding intermediate signal on the ADC map) within the paramedian mid to anterior right frontal lobe (series 5, image 81). This likely reflects a subacute infarct. Chronic lacunar infarcts within the bilateral basal ganglia and left thalamus. Background mild-to-moderate multifocal T2/FLAIR hyperintensity within the cerebral white matter and pons, nonspecific but compatible with chronic small vessel ischemic disease. No evidence of an intracranial mass. No chronic  intracranial blood products. No extra-axial fluid collection. No midline shift. Vascular: Maintained flow voids within the proximal large arterial vessels. Skull and upper cervical spine: No focal suspicious marrow lesion. Sinuses/Orbits: Visualized orbits show no acute finding. Trace mucosal thickening within the right frontal and bilateral ethmoid air cells. IMPRESSION: 11 mm acute lacunar infarct within the right thalamocapsular junction. 5 mm probable subacute infarct within the subcortical white matter of the mid-to-anterior right frontal lobe. Chronic lacunar infarcts within the bilateral basal ganglia and left thalamus. Background mild-to-moderate chronic small vessel ischemic disease within the cerebral white matter and pons. Mild generalized parenchymal atrophy. Minimal paranasal sinus mucosal thickening, as described. Electronically Signed   By: Jackey Loge D.O.   On: 01/10/2021 15:13   ECHOCARDIOGRAM COMPLETE  Result Date: 01/10/2021    ECHOCARDIOGRAM REPORT   Patient Name:   Christopher [name] Date of Exam: 01/10/2021 Medical Rec #:  [id number]        Height:       69.0 in Accession #:    [id number]       Weight:       136.6 lb Date of Birth:  02-17-1966        BSA:          1.757 m Patient Age:  55 years         BP:           195/95 mmHg Patient Gender: M                HR:           56 bpm. Exam Location:  Inpatient Procedure: 2D Echo, Cardiac Doppler and Color Doppler Indications:    Stroke I63.9  History:        Patient has no prior history of Echocardiogram examinations.  Sonographer:    Roosvelt Maser RDCS Referring Phys: [id number] ALLISON WOLFE IMPRESSIONS  1. Left ventricular ejection fraction, by estimation, is 55 to 60%. The left ventricle has normal function. The left ventricle has no regional wall motion abnormalities. There is mild left ventricular hypertrophy. Left ventricular diastolic parameters are consistent with Grade I diastolic dysfunction (impaired relaxation).  2. Right ventricular  systolic function is normal. The right ventricular size is normal. There is normal pulmonary artery systolic pressure. The estimated right ventricular systolic pressure is 31.3 mmHg.  3. The mitral valve is normal in structure. No evidence of mitral valve regurgitation. No evidence of mitral stenosis.  4. The aortic valve was not well visualized. Aortic valve regurgitation is mild. No aortic stenosis is present.  5. The inferior vena cava is normal in size with greater than 50% respiratory variability, suggesting right atrial pressure of 3 mmHg. FINDINGS  Left Ventricle: Left ventricular ejection fraction, by estimation, is 55 to 60%. The left ventricle has normal function. The left ventricle has no regional wall motion abnormalities. The left ventricular internal cavity size was normal in size. There is  mild left ventricular hypertrophy. Left ventricular diastolic parameters are consistent with Grade I diastolic dysfunction (impaired relaxation). Right Ventricle: The right ventricular size is normal. No increase in right ventricular wall thickness. Right ventricular systolic function is normal. There is normal pulmonary artery systolic pressure. The tricuspid regurgitant velocity is 2.66 m/s, and  with an assumed right atrial pressure of 3 mmHg, the estimated right ventricular systolic pressure is 31.3 mmHg. Left Atrium: Left atrial size was normal in size. Right Atrium: Right atrial size was normal in size. Pericardium: There is no evidence of pericardial effusion. Mitral Valve: The mitral valve is normal in structure. No evidence of mitral valve regurgitation. No evidence of mitral valve stenosis. Tricuspid Valve: The tricuspid valve is normal in structure. Tricuspid valve regurgitation is trivial. Aortic Valve: The aortic valve was not well visualized. Aortic valve regurgitation is mild. No aortic stenosis is present. Aortic valve mean gradient measures 4.0 mmHg. Aortic valve peak gradient measures 6.5 mmHg.  Aortic valve area, by VTI measures 3.17  cm. Pulmonic Valve: The pulmonic valve was normal in structure. Pulmonic valve regurgitation is not visualized. No evidence of pulmonic stenosis. Aorta: The aortic root is normal in size and structure. Venous: The inferior vena cava is normal in size with greater than 50% respiratory variability, suggesting right atrial pressure of 3 mmHg. IAS/Shunts: No atrial level shunt detected by color flow Doppler.  LEFT VENTRICLE PLAX 2D LVIDd:         3.60 cm  Diastology LVIDs:         2.70 cm  LV e' medial:    6.60 cm/s LV PW:         1.25 cm  LV E/e' medial:  11.0 LV IVS:        1.05 cm  LV e' lateral:   9.23 cm/s LVOT diam:  2.10 cm  LV E/e' lateral: 7.9 LV SV:         82 LV SV Index:   47 LVOT Area:     3.46 cm  RIGHT VENTRICLE RV Basal diam:  3.30 cm LEFT ATRIUM           Index       RIGHT ATRIUM           Index LA diam:      3.20 cm 1.82 cm/m  RA Area:     12.60 cm LA Vol (A2C): 36.2 ml 20.58 ml/m RA Volume:   30.20 ml  17.19 ml/m LA Vol (A4C): 41.9 ml 23.85 ml/m  AORTIC VALVE AV Area (Vmax):    2.86 cm AV Area (Vmean):   2.75 cm AV Area (VTI):     3.17 cm AV Vmax:           127.00 cm/s AV Vmean:          92.800 cm/s AV VTI:            0.258 m AV Peak Grad:      6.5 mmHg AV Mean Grad:      4.0 mmHg LVOT Vmax:         105.00 cm/s LVOT Vmean:        73.600 cm/s LVOT VTI:          0.236 m LVOT/AV VTI ratio: 0.91  AORTA Ao Root diam: 3.20 cm MITRAL VALVE               TRICUSPID VALVE MV Area (PHT): 3.46 cm    TR Peak grad:   28.3 mmHg MV Decel Time: 219 msec    TR Vmax:        266.00 cm/s MV E velocity: 72.80 cm/s MV A velocity: 81.20 cm/s  SHUNTS MV E/A ratio:  0.90        Systemic VTI:  0.24 m                            Systemic Diam: 2.10 cm Dalton McleanMD Electronically signed by Wilfred Lacyalton McleanMD Signature Date/Time: 01/10/2021/4:52:23 PM    Final     Subjective: Seen and examined at bedside and is still very weak on the left.  No chest pain or shortness of breath.   Eating his breakfast.  No other concerns or complaints at this time.  Ready to go to CIR.  Discharge Exam: Vitals:   01/11/21 0408 01/12/21 0831  BP: (!) 148/92 (!) 155/97  Pulse: 67 70  Resp: 18 20  Temp: 98.4 F (36.9 C) 97.9 F (36.6 C)  SpO2: 96% 92%   Vitals:   01/10/21 2059 01/11/21 0408 01/12/21 0821 01/12/21 0831  BP: (!) 164/96 (!) 148/92  (!) 155/97  Pulse: 69 67  70  Resp: 18 18  20   Temp: 98.5 F (36.9 C) 98.4 F (36.9 C)  97.9 F (36.6 C)  TempSrc: Oral Oral  Oral  SpO2: 94% 96%  92%  Weight:   52.5 kg    General: Pt is alert, awake, not in acute distress Cardiovascular: RRR, S1/S2 +, no rubs, no gallops Respiratory: Diminished bilaterally, no wheezing, no rhonchi; Unlabored breathing Abdominal: Soft, NT, ND, bowel sounds + Extremities: no edema, no cyanosis; left-sided weakness  The results of significant diagnostics from this hospitalization (including imaging, microbiology, ancillary and laboratory) are listed below for reference.    Microbiology: Recent Results (from  the past 240 hour(s))  SARS CORONAVIRUS 2 (TAT 6-24 HRS) Nasopharyngeal Nasopharyngeal Swab     Status: None   Collection Time: 01/10/21 12:24 PM   Specimen: Nasopharyngeal Swab  Result Value Ref Range Status   SARS Coronavirus 2 NEGATIVE NEGATIVE Final    Comment: (NOTE) SARS-CoV-2 target nucleic acids are NOT DETECTED.  The SARS-CoV-2 RNA is generally detectable in upper and lower respiratory specimens during the acute phase of infection. Negative results do not preclude SARS-CoV-2 infection, do not rule out co-infections with other pathogens, and should not be used as the sole basis for treatment or other patient management decisions. Negative results must be combined with clinical observations, patient history, and epidemiological information. The expected result is Negative.  Fact Sheet for Patients: HairSlick.no  Fact Sheet for Healthcare  Providers: quierodirigir.com  This test is not yet approved or cleared by the [place] FDA and  has been authorized for detection and/or diagnosis of SARS-CoV-2 by FDA under an Emergency Use Authorization (EUA). This EUA will remain  in effect (meaning this test can be used) for the duration of the COVID-19 declaration under Se ction 564(b)(1) of the Act, 21 U.S.C. section 360bbb-3(b)(1), unless the authorization is terminated or revoked sooner.  Performed at [facility], [address] [place], [place] [address]   MRSA Next Gen by PCR, Nasal     Status: None   Collection Time: 01/11/21 12:03 PM   Specimen: Nasal Mucosa; Nasal Swab  Result Value Ref Range Status   MRSA by PCR Next Gen NOT DETECTED NOT DETECTED Final    Comment: (NOTE) The GeneXpert MRSA Assay (FDA approved for NASAL specimens only), is one component of a comprehensive MRSA colonization surveillance program. It is not intended to diagnose MRSA infection nor to guide or monitor treatment for MRSA infections. Test performance is not FDA approved in patients less than 27 years old. Performed at [facility], [address], [place], [place] [address]     Labs: BNP (last 3 results) No results for input(s): BNP in the last 8760 hours. Basic Metabolic Panel: Recent Labs  Lab 01/10/21 1035 01/11/21 0109 01/12/21 0206  NA 137 134* 134*  K 4.5 3.6 4.0  CL 103 99 103  CO2 27 27 25   GLUCOSE 114* 114* 104*  BUN 12 12 14   CREATININE 1.28* 1.20 1.10  CALCIUM 9.2 8.6* 9.2   Liver Function Tests: Recent Labs  Lab 01/10/21 1035  AST 23  ALT 18  ALKPHOS 58  BILITOT 0.8  PROT 7.0  ALBUMIN 3.8   No results for input(s): LIPASE, AMYLASE in the last 168 hours. No results for input(s): AMMONIA in the last 168 hours. CBC: Recent Labs  Lab 01/10/21 1035 01/11/21 0109 01/12/21 0206  WBC 14.1* 10.9* 9.8  NEUTROABS 11.0*  --  5.7  HGB 15.7 14.4 15.4  HCT 48.5 42.9  44.9  MCV 90.8 88.1 88.0  PLT 319 275 281   Cardiac Enzymes: No results for input(s): CKTOTAL, CKMB, CKMBINDEX, TROPONINI in the last 168 hours. BNP: Invalid input(s): POCBNP CBG: Recent Labs  Lab 01/10/21 1028  GLUCAP 86   D-Dimer No results for input(s): DDIMER in the last 72 hours. Hgb A1c Recent Labs    01/11/21 0109  HGBA1C 5.8*   Lipid Profile Recent Labs    01/11/21 0109  CHOL 102  HDL 25*  LDLCALC 60  TRIG 86  CHOLHDL 4.1   Thyroid function studies No results for input(s): TSH,  T4TOTAL, T3FREE, THYROIDAB in the last 72 hours.  Invalid input(s): FREET3 Anemia work up No results for input(s): VITAMINB12, FOLATE, FERRITIN, TIBC, IRON, RETICCTPCT in the last 72 hours. Urinalysis    Component Value Date/Time   COLORURINE YELLOW 10/13/2006 0742   APPEARANCEUR CLEAR 10/13/2006 0742   LABSPEC 1.030 10/13/2006 0742   PHURINE 5.5 10/13/2006 0742   GLUCOSEU NEGATIVE 10/13/2006 0742   HGBUR NEGATIVE 10/13/2006 0742   BILIRUBINUR SMALL (A) 10/13/2006 0742   KETONESUR 15 (A) 10/13/2006 0742   PROTEINUR NEGATIVE 10/13/2006 0742   UROBILINOGEN 1.0 10/13/2006 0742   NITRITE NEGATIVE 10/13/2006 0742   LEUKOCYTESUR  10/13/2006 0742    NEGATIVE MICROSCOPIC NOT DONE ON URINES WITH NEGATIVE PROTEIN, BLOOD, LEUKOCYTES, NITRITE, OR GLUCOSE <1000 mg/dL.   Sepsis Labs Invalid input(s): PROCALCITONIN,  WBC,  LACTICIDVEN Microbiology Recent Results (from the past 240 hour(s))  SARS CORONAVIRUS 2 (TAT 6-24 HRS) Nasopharyngeal Nasopharyngeal Swab     Status: None   Collection Time: 01/10/21 12:24 PM   Specimen: Nasopharyngeal Swab  Result Value Ref Range Status   SARS Coronavirus 2 NEGATIVE NEGATIVE Final    Comment: (NOTE) SARS-CoV-2 target nucleic acids are NOT DETECTED.  The SARS-CoV-2 RNA is generally detectable in upper and lower respiratory specimens during the acute phase of infection. Negative results do not preclude SARS-CoV-2 infection, do not rule  out co-infections with other pathogens, and should not be used as the sole basis for treatment or other patient management decisions. Negative results must be combined with clinical observations, patient history, and epidemiological information. The expected result is Negative.  Fact Sheet for Patients: HairSlick.no  Fact Sheet for Healthcare Providers: quierodirigir.com  This test is not yet approved or cleared by the [place] FDA and  has been authorized for detection and/or diagnosis of SARS-CoV-2 by FDA under an Emergency Use Authorization (EUA). This EUA will remain  in effect (meaning this test can be used) for the duration of the COVID-19 declaration under Se ction 564(b)(1) of the Act, 21 U.S.C. section 360bbb-3(b)(1), unless the authorization is terminated or revoked sooner.  Performed at [facility], [address], [place], [place] [address]   MRSA Next Gen by PCR, Nasal     Status: None   Collection Time: 01/11/21 12:03 PM   Specimen: Nasal Mucosa; Nasal Swab  Result Value Ref Range Status   MRSA by PCR Next Gen NOT DETECTED NOT DETECTED Final    Comment: (NOTE) The GeneXpert MRSA Assay (FDA approved for NASAL specimens only), is one component of a comprehensive MRSA colonization surveillance program. It is not intended to diagnose MRSA infection nor to guide or monitor treatment for MRSA infections. Test performance is not FDA approved in patients less than 34 years old. Performed at [facility], [address], [place], [place] [address]    Time coordinating discharge: 35 minutes  SIGNED:  Merlene Laughter, DO [facility] 01/12/2021, 11:52 AM Pager is on AMION  If 7PM-7AM, please contact night-coverage [email or web address]

## 2021-01-12 NOTE — Progress Notes (Signed)
Pt transferred to 5C08.

## 2021-01-12 NOTE — Progress Notes (Addendum)
Inpatient Rehab Admissions Coordinator:   Consult received. I met with patient at the bedside to discuss CIR goals/expectations. I also spoke to his sister, Jackelyn Poling, and left a voicemail for his boss, Parthenia Ames, per his request.  I explained to pt estimated length of stay about 2 weeks, dependent on progress, and 3 hr/day of therapy.  I feel he can likely reach at least an intermittent mod I level.  Pt lives in a garage apartment at his Daryl's home and the plan is to return there at discharge.  Per pt, and confirmed by his sister, he has numerous friends and family available to check on him and make sure he's doing alright if Coralyn Pear is out of the house.  I reviewed cost of care with pt, as well as referral to [place] for medicaid screening.  I do have a bed available for him to admit today, if he is ready.    1120: Dr. Alfredia Ferguson in agreement for pt to admit today. Will let pt/family and TOC team know.   Shann Medal, PT, DPT Admissions Coordinator [phone] 01/12/21  11:16 AM

## 2021-01-12 NOTE — H&P (Signed)
Physical Medicine and Rehabilitation Admission H&P    CC: Functional deficits    HPI: Christopher [name] is a 55 year old LH-male with history of tobacco use and cocaine use otherwise in good health who was admitted on 01/10/2021 with one day history of slurred speech and left hemiparesis.  History taken from chart review and patient. 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 MRI brain done revealing 11 mm acute right thalamocapsular junction and 5 mm probable subacute infarct within the subcortical white matter in right frontal lobe.  Echocardiogram with ejection fraction of 55-60% and mild LVH. UDS positive for cocaine and THC. Dr. Roda Shutters felt that stroke likely due to cocaine use and recommended DAPT x3 weeks followed by ASA alone. Patient has been educated on importance of cessation/lifestyle changes. Therapy evaluations completed revealing mild dysarthria with unsteady gait and balance deficits. CIR recommended due to functional decline.  Please see preadmission assessment from earlier today as well.   Review of Systems  Constitutional:  Negative for chills and fever.  HENT:  Negative for hearing loss.   Eyes:  Negative for blurred vision and double vision.       Wears reading glasses  Respiratory:  Negative for cough and shortness of breath.   Cardiovascular:  Negative for chest pain and palpitations.  Gastrointestinal:  Negative for abdominal pain, nausea and vomiting.  Genitourinary:  Negative for dysuria and urgency.  Musculoskeletal:  Negative for back pain, joint pain, myalgias and neck pain.  Skin:  Negative for rash.  Neurological:  Positive for focal weakness and weakness. Negative for sensory change.  Psychiatric/Behavioral:  The patient does not have insomnia.   All other systems reviewed and are negative.   Past medical history: PUD complicated with fluid overload?--in 2008.   Past surgical history:  Thoracocentesis? --2008 during hospitalization for PUD.    Family History  Problem Relation Age of Onset   Cancer Mother    Stroke Father     Social History:  Divorced.  Has a room over garage (Boss's home)-->works as a Curator. Per  reports that he has been smoking cigarettes. He has been smoking an average of 1 PPD for about 40 years. He has quit using smokeless tobacco. He reports current alcohol use--3-5 drinks of 22 ounce of 8% alcohol/day.  He reports current drug use. Drug: "Crack" cocaine.   Allergies: No Known Allergies   Medications Prior to Admission  Medication Sig Dispense Refill   acetaminophen (TYLENOL) 325 MG tablet Take 2 tablets (650 mg total) by mouth every 4 (four) hours as needed for mild pain (or temp > 37.5 C (99.5 F)).     [START ON 01/13/2021] aspirin 81 MG chewable tablet Chew 1 tablet (81 mg total) by mouth daily. 30 tablet 11   [START ON 01/13/2021] atorvastatin (LIPITOR) 20 MG tablet Take 1 tablet (20 mg total) by mouth daily. 30 tablet 1   [START ON 01/13/2021] clopidogrel (PLAVIX) 75 MG tablet Take 1 tablet (75 mg total) by mouth daily. 21 tablet 0   senna-docusate (SENOKOT-S) 8.6-50 MG tablet Take 1 tablet by mouth at bedtime as needed for mild constipation.      Drug Regimen Review  Drug regimen was reviewed and remains appropriate with no significant issues identified  Home: Home Living Family/patient expects to be discharged to:: Private residence Living Arrangements: Non-relatives/Friends Available Help at Discharge: Available PRN/intermittently Type of Home: House Home Access: Stairs  to enter Entrance Stairs-Number of Steps: 2 Entrance Stairs-Rails: None Home Layout: One level Bathroom Shower/Tub: Health visitor: Standard Home Equipment: None  Lives With: Friend(s)   Functional History: Prior Function Level of Independence: Independent 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  Functional Status:   Mobility: Bed Mobility Overal bed mobility: Modified Independent Bed Mobility: Supine to Sit Supine to sit: Modified independent (Device/Increase time), HOB elevated Sit to supine: Supervision General bed mobility comments: 30 degrees HOB with assist from bedrails to scoot hips EOB Transfers Overall transfer level: Needs assistance Equipment used: 1 person hand held assist Transfers: Sit to/from Stand Sit to Stand: Min assist Stand pivot transfers: Min assist General transfer comment: min assist for safety with sit to stand Ambulation/Gait Ambulation/Gait assistance: Min assist Gait Distance (Feet): 50 Feet Assistive device: 1 person hand held assist Gait Pattern/deviations: Step-through pattern, Decreased step length - left General Gait Details: poor attention to left side when ambulation. Almost running into sink each time. Poor coordination of left and leaning to left with gait. Requires min assist to maintain balance. Gait velocity: decr    ADL: ADL Overall ADL's : Needs assistance/impaired Grooming: Wash/dry hands, Minimal assistance, Standing Upper Body Dressing : Supervision/safety, Sitting Lower Body Dressing: Minimal assistance, Sitting/lateral leans Functional mobility during ADLs: Minimal assistance General ADL Comments: HHA  Cognition: Cognition Overall Cognitive Status: Within Functional Limits for tasks assessed Orientation Level: Oriented X4 Attention: Focused, Sustained Focused Attention: Appears intact Sustained Attention: Appears intact Memory: Appears intact Cognition Arousal/Alertness: Awake/alert Behavior During Therapy: WFL for tasks assessed/performed Overall Cognitive Status: Within Functional Limits for tasks assessed Area of Impairment: Safety/judgement Safety/Judgement: Decreased awareness of safety, Decreased awareness of deficits General Comments: Follows one step commands accurately. A&O x4.   Blood pressure (!) 166/87, pulse 61,  temperature 98.1 F (36.7 C), temperature source Oral, resp. rate 16, weight 52.5 kg, SpO2 92 %. Physical Exam Vitals reviewed.  Constitutional:      General: He is not in acute distress.    Appearance: He is not ill-appearing.  HENT:     Head: Normocephalic and atraumatic.     Right Ear: External ear normal.     Left Ear: External ear normal.     Nose: Nose normal.  Eyes:     General:        Right eye: No discharge.        Left eye: No discharge.     Extraocular Movements: Extraocular movements intact.  Cardiovascular:     Rate and Rhythm: Normal rate and regular rhythm.  Pulmonary:     Effort: Pulmonary effort is normal. No respiratory distress.     Breath sounds: Normal breath sounds. No stridor.  Abdominal:     General: There is distension.     Comments: Hypoactive bowel sounds  Musculoskeletal:     Cervical back: Normal range of motion and neck supple.     Comments: No edema or tenderness in extremities  Skin:    General: Skin is warm and dry.     Comments: Irritation along beard hairline  Neurological:     Mental Status: He is alert.     Comments: Alert and oriented to person and place Motor: RUE/RLE: 5/5 proximal distally in LUE/LE: 4/5 proximal distal Dysarthria  Psychiatric:     Comments: Slightly delayed and slowed    Results for orders placed or performed during the hospital encounter of 01/10/21 (from the past 48 hour(s))  Hemoglobin A1c  Status: Abnormal   Collection Time: 01/11/21  1:09 AM  Result Value Ref Range   Hgb A1c MFr Bld 5.8 (H) 4.8 - 5.6 %    Comment: (NOTE) Pre diabetes:          5.7%-6.4%  Diabetes:              >6.4%  Glycemic control for   <7.0% adults with diabetes    Mean Plasma Glucose 119.76 mg/dL    Comment: Performed at [facility], [address] [place], [place] [address]  Lipid panel     Status: Abnormal   Collection Time: 01/11/21  1:09 AM  Result Value Ref Range   Cholesterol 102 0 - 200 mg/dL    Triglycerides 86 <604<150 mg/dL   HDL 25 (L) >54>40 mg/dL   Total CHOL/HDL Ratio 4.1 RATIO   VLDL 17 0 - 40 mg/dL   LDL Cholesterol 60 0 - 99 mg/dL    Comment:        Total Cholesterol/HDL:CHD Risk Coronary Heart Disease Risk Table                     Men   Women  1/2 Average Risk   3.4   3.3  Average Risk       5.0   4.4  2 X Average Risk   9.6   7.1  3 X Average Risk  23.4   11.0        Use the calculated Patient Ratio above and the CHD Risk Table to determine the patient's CHD Risk.        ATP III CLASSIFICATION (LDL):  <100     mg/dL   Optimal  098-119100-129  mg/dL   Near or Above                    Optimal  130-159  mg/dL   Borderline  [id number]  mg/dL   High  >562>190     mg/dL   Very High Performed at [facility], [address] [place], [place] [address]   Basic metabolic panel     Status: Abnormal   Collection Time: 01/11/21  1:09 AM  Result Value Ref Range   Sodium 134 (L) 135 - 145 mmol/L   Potassium 3.6 3.5 - 5.1 mmol/L   Chloride 99 98 - 111 mmol/L   CO2 27 22 - 32 mmol/L   Glucose, Bld 114 (H) 70 - 99 mg/dL    Comment: Glucose reference range applies only to samples taken after fasting for at least 8 hours.   BUN 12 6 - 20 mg/dL   Creatinine, Ser 5.781.20 0.61 - 1.24 mg/dL   Calcium 8.6 (L) 8.9 - 10.3 mg/dL   GFR, Estimated >46>60 >96>60 mL/min    Comment: (NOTE) Calculated using the CKD-EPI Creatinine Equation (2021)    Anion gap 8 5 - 15    Comment: Performed at [facility], [address] [place], [place] [address]  CBC     Status: Abnormal   Collection Time: 01/11/21  1:09 AM  Result Value Ref Range   WBC 10.9 (H) 4.0 - 10.5 K/uL   RBC 4.87 4.22 - 5.81 MIL/uL   Hemoglobin 14.4 13.0 - 17.0 g/dL   HCT 41.342.9 24.439.0 - 01.052.0 %   MCV 88.1 80.0 - 100.0 fL   MCH 29.6 26.0 - 34.0 pg   MCHC 33.6 30.0 - 36.0 g/dL   RDW  11.7 11.5 - 15.5 %   Platelets 275 150 - 400 K/uL   nRBC 0.0 0.0 - 0.2 %    Comment: Performed at [facility], [address], [place],  [place] [address]  MRSA Next Gen by PCR, Nasal     Status: None   Collection Time: 01/11/21 12:03 PM   Specimen: Nasal Mucosa; Nasal Swab  Result Value Ref Range   MRSA by PCR Next Gen NOT DETECTED NOT DETECTED    Comment: (NOTE) The GeneXpert MRSA Assay (FDA approved for NASAL specimens only), is one component of a comprehensive MRSA colonization surveillance program. It is not intended to diagnose MRSA infection nor to guide or monitor treatment for MRSA infections. Test performance is not FDA approved in patients less than 77 years old. Performed at [facility], [address] [place], [place] [address]   Basic metabolic panel     Status: Abnormal   Collection Time: 01/12/21  2:06 AM  Result Value Ref Range   Sodium 134 (L) 135 - 145 mmol/L   Potassium 4.0 3.5 - 5.1 mmol/L   Chloride 103 98 - 111 mmol/L   CO2 25 22 - 32 mmol/L   Glucose, Bld 104 (H) 70 - 99 mg/dL    Comment: Glucose reference range applies only to samples taken after fasting for at least 8 hours.   BUN 14 6 - 20 mg/dL   Creatinine, Ser 9.21 0.61 - 1.24 mg/dL   Calcium 9.2 8.9 - 19.4 mg/dL   GFR, Estimated >17 >40 mL/min    Comment: (NOTE) Calculated using the CKD-EPI Creatinine Equation (2021)    Anion gap 6 5 - 15    Comment: Performed at [facility], [address], [place], [place] [address]  CBC with Differential/Platelet     Status: None   Collection Time: 01/12/21  2:06 AM  Result Value Ref Range   WBC 9.8 4.0 - 10.5 K/uL   RBC 5.10 4.22 - 5.81 MIL/uL   Hemoglobin 15.4 13.0 - 17.0 g/dL   HCT 18.5 63.1 - 49.7 %   MCV 88.0 80.0 - 100.0 fL   MCH 30.2 26.0 - 34.0 pg   MCHC 34.3 30.0 - 36.0 g/dL   RDW 02.6 37.8 - 58.8 %   Platelets 281 150 - 400 K/uL   nRBC 0.0 0.0 - 0.2 %   Neutrophils Relative % 58 %   Neutro Abs 5.7 1.7 - 7.7 K/uL   Lymphocytes Relative 30 %   Lymphs Abs 2.9 0.7 - 4.0 K/uL   Monocytes Relative 8 %   Monocytes Absolute 0.8 0.1 - 1.0 K/uL   Eosinophils Relative 3 %    Eosinophils Absolute 0.2 0.0 - 0.5 K/uL   Basophils Relative 1 %   Basophils Absolute 0.1 0.0 - 0.1 K/uL   Immature Granulocytes 0 %   Abs Immature Granulocytes 0.02 0.00 - 0.07 K/uL    Comment: Performed at [facility], [address] [place], [place] [address]   CT ANGIO HEAD NECK W WO CM  Result Date: 01/10/2021 CLINICAL DATA:  Follow-up examination for acute stroke. EXAM: CT ANGIOGRAPHY HEAD AND NECK TECHNIQUE: Multidetector CT imaging of the head and neck was performed using the standard protocol during bolus administration of intravenous contrast. Multiplanar CT image reconstructions and MIPs were obtained to evaluate the vascular anatomy. Carotid stenosis measurements (when applicable) are obtained utilizing NASCET criteria, using the distal internal carotid diameter as the denominator. CONTRAST:  39mL OMNIPAQUE IOHEXOL 350 MG/ML SOLN COMPARISON:  Prior CTA and MRI from earlier the same day. FINDINGS: CTA NECK FINDINGS Aortic arch: Visualized aortic arch normal in caliber with normal branch pattern. Mild atheromatous change about the arch and origin of the great vessels without hemodynamically significant stenosis. Right carotid system: Scattered atheromatous irregularity about the right common and internal carotid arteries without hemodynamically significant stenosis. No dissection or vascular occlusion. Left carotid system: Scattered atheromatous irregularity about the left common and internal carotid arteries without hemodynamically significant stenosis. No dissection or vascular occlusion. Vertebral arteries: Both vertebral arteries arise from the subclavian arteries. No proximal subclavian artery stenosis. Atheromatous change at the origins of both vertebral arteries with associated moderate stenosis on the left and severe stenosis on the right. Vertebral arteries otherwise patent distally without stenosis, dissection or occlusion. Skeleton: No visible acute osseous finding. No discrete  or worrisome osseous lesions. Reversal of the normal cervical lordosis with grade 1 anterolisthesis of C3 on C4 and C4 on C5, with trace retrolisthesis of C5 on C6 and C6 on C7. Moderate spondylosis present at C5-6 and C6-7. Other neck: No other acute soft tissue abnormality within the neck. No mass or adenopathy. Punctate sialolith noted within the right parotid gland. Upper chest: Emphysema with scattered bronchiectatic changes. Visualized upper chest demonstrates no other acute finding. Review of the MIP images confirms the above findings CTA HEAD FINDINGS Anterior circulation: Both internal carotid arteries patent to the termini without hemodynamically significant stenosis. A1 segments patent bilaterally. Normal anterior communicating artery complex. Anterior cerebral arteries patent to their distal aspects without stenosis. No M1 stenosis or occlusion. Normal MCA bifurcations. No proximal MCA branch occlusion. Distal MCA branches well perfused and symmetric. Posterior circulation: Both vertebral arteries patent to the vertebrobasilar junction without stenosis. Left vertebral artery slightly dominant. Both PICA origins patent and normal. Basilar patent to its distal aspect without stenosis. Superior cerebellar arteries patent bilaterally. Right PCA supplied via the basilar. Fetal type origin of the left PCA. Atheromatous irregularity within the right greater than left PCAs without hemodynamically significant stenosis. Venous sinuses: Patent allowing for timing the contrast bolus. Anatomic variants: Fetal type origin of the left PCA. No intracranial aneurysm. Review of the MIP images confirms the above findings IMPRESSION: 1. Negative CTA for large vessel occlusion. 2. 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 3. Additional mild-to-moderate atheromatous irregularity elsewhere about the major arterial vasculature of the  head and neck. No other hemodynamically significant or correctable stenosis. 4. Emphysema (ICD10-J43.9). Electronically Signed   By: Rise Mu M.D.   On: 01/10/2021 23:00       Medical Problem List and Plan: 1.  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  -patient may shower  -ELOS/Goals: 13-16 days/Supervision/min a  Admit to CIR 2.  Antithrombotics: -DVT/anticoagulation:  Pharmaceutical: Lovenox  -antiplatelet therapy: DAPT X 3 weeks followed by ASA alone.  3. Pain Management: Tylenol prn.  4. Mood: LCSW to follow for evaluation and support.   -antipsychotic agents: N/A 5. Neuropsych: This patient is not capable of making decisions on his own behalf. 6. Skin/Wound Care: Routine pressure relief measures.  7. Fluids/Electrolytes/Nutrition: Monitor I/Os  CMP ordered for tomorrow a.m. 8. HTN: Monitor BP TID w/permissive HTN for 5-7 days to  normalize OK for <220/120 (avoid hypoperfusion)   Monitor  his increase mobility 9. Hyperlipidemia: LDL at [facility] but HDL<265. Now on low dose statin- Lipitor 20 mg/day 10. Polysubstance abuse:   Counseled on appropriate 11. Leucocytosis: Resolving-->monitor for signs of infection.   CBC ordered for tomorrow. 12. Hyponatremia:   CMP ordered for tomorrow.   Jacquelynn Cree, PA-C 01/12/2021  I have personally performed a face to face diagnostic evaluation, including, but not limited to relevant history and physical exam findings, of this patient and developed relevant assessment and plan.  Additionally, I have reviewed and concur with the physician assistant's documentation above.  Maryla Morrow, MD, ABPMR

## 2021-01-12 NOTE — Progress Notes (Signed)
Physical Therapy Treatment Patient Details Name: Christopher [name] MRN: [id number] DOB: 1965/12/08 Today's Date: 01/12/2021    History of Present Illness 55 y.o. admitted 9/5 with R sided weakness and slurred speech. MRI 9/5 revealed acute lacunar infarct in thalamocapsular junction and chronic lacunar infarcts in the bilateral basal ganglia and left thamalus. Therapy evals with noted Left hemiparesis. PMH: tobacco and cocaine abuse, HTN    PT Comments    Patient received in bed sleeping. Easily wakes to touch, voice. Patient agrees to PT session. He is mod independent with bed mobility using rails. Patient performed sit to stand with min assist. Ambulated 50 feet with min assist. Poor coordination on left and leaning to left with ambulation. Performed L side exercises. He will continue to benefit from skilled PT while here to improve functional independence and strength for improved safety with mobility.       Follow Up Recommendations  CIR     Equipment Recommendations  Other (comment) (TBD)    Recommendations for Other Services Rehab consult     Precautions / Restrictions Precautions Precautions: Fall Precaution Comments: left weakness Restrictions Weight Bearing Restrictions: No    Mobility  Bed Mobility Overal bed mobility: Modified Independent Bed Mobility: Supine to Sit     Supine to sit: Modified independent (Device/Increase time);HOB elevated     General bed mobility comments: 30 degrees HOB with assist from bedrails to scoot hips EOB    Transfers Overall transfer level: Needs assistance Equipment used: 1 person hand held assist Transfers: Sit to/from Stand Sit to Stand: Min assist         General transfer comment: min assist for safety with sit to stand  Ambulation/Gait Ambulation/Gait assistance: Min assist Gait Distance (Feet): 50 Feet Assistive device: 1 person hand held assist Gait Pattern/deviations: Step-through pattern;Decreased step length -  left Gait velocity: decr   General Gait Details: poor attention to left side when ambulation. Almost running into sink each time. Poor coordination of left and leaning to left with gait. Requires min assist to maintain balance.   Stairs             Wheelchair Mobility    Modified Rankin (Stroke Patients Only) Modified Rankin (Stroke Patients Only) Pre-Morbid Rankin Score: No symptoms Modified Rankin: Moderately severe disability     Balance Overall balance assessment: Needs assistance Sitting-balance support: Feet supported Sitting balance-Leahy Scale: Fair     Standing balance support: Single extremity supported;During functional activity Standing balance-Leahy Scale: Poor Standing balance comment: Requires external and RUE support for ambulation                            Cognition Arousal/Alertness: Awake/alert Behavior During Therapy: WFL for tasks assessed/performed Overall Cognitive Status: Within Functional Limits for tasks assessed Area of Impairment: Safety/judgement                         Safety/Judgement: Decreased awareness of safety;Decreased awareness of deficits     General Comments: Follows one step commands accurately. A&O x4.      Exercises Other Exercises Other Exercises: UE exercises to include: shoulder flexion, elbow flexion, hand open close x 5-10 reps as able. LE exercises: AP, LAQ, seated marching, SLR x 10 reps.    General Comments        Pertinent Vitals/Pain Pain Assessment: No/denies pain    Home Living  Prior Function            PT Goals (current goals can now be found in the care plan section) Acute Rehab PT Goals Patient Stated Goal: Get around better and use L arm PT Goal Formulation: With patient Time For Goal Achievement: 01/25/21 Potential to Achieve Goals: Good Progress towards PT goals: Progressing toward goals    Frequency    Min 4X/week      PT  Plan Current plan remains appropriate    Co-evaluation              AM-PAC PT "6 Clicks" Mobility   Outcome Measure  Help needed turning from your back to your side while in a flat bed without using bedrails?: A Little Help needed moving from lying on your back to sitting on the side of a flat bed without using bedrails?: A Little Help needed moving to and from a bed to a chair (including a wheelchair)?: A Little Help needed standing up from a chair using your arms (e.g., wheelchair or bedside chair)?: A Little Help needed to walk in hospital room?: A Little Help needed climbing 3-5 steps with a railing? : A Lot 6 Click Score: 17    End of Session Equipment Utilized During Treatment: Gait belt Activity Tolerance: Patient limited by fatigue Patient left: in chair;with call bell/phone within reach;with chair alarm set Nurse Communication: Mobility status PT Visit Diagnosis: Unsteadiness on feet (R26.81);Other abnormalities of gait and mobility (R26.89);Muscle weakness (generalized) (M62.81);Hemiplegia and hemiparesis;Difficulty in walking, not elsewhere classified (R26.2) Hemiplegia - Right/Left: Left Hemiplegia - dominant/non-dominant: Dominant     Time: 4827-0786 PT Time Calculation (min) (ACUTE ONLY): 17 min  Charges:  $Therapeutic Exercise: 8-22 mins                    Dulcemaria Bula, PT, GCS 01/12/21,9:54 AM

## 2021-01-12 NOTE — Progress Notes (Signed)
PMR Admission Coordinator Pre-Admission Assessment  Patient: Christopher [name] is an 55 y.o., male MRN: [id number] DOB: 05/30/1965 Height:   Weight: 52.5 kg  Insurance Information HMO:     PPO:      PCP:      IPA:      80/20:      OTHER:  PRIMARY: uninsured      Policy#:       Subscriber:  CM Name:       Phone#:      Fax#:  Pre-Cert#:       Employer:  Benefits:  Phone #:      Name:  Eff. Date:      Deduct:       Out of Pocket Max:       Life Max:  CIR:       SNF:  Outpatient:      Co-Pay:  Home Health:       Co-Pay:  DME:      Co-Pay:  Providers:  SECONDARY:       Policy#:      Phone#:   Financial Counselor:       Phone#:   The "Data Collection Information Summary" for patients in Inpatient Rehabilitation Facilities with attached "Privacy Act Statement-Health Care Records" was provided and verbally reviewed with: N/A  Emergency Contact Information Contact Information     Name Relation Home Work Mobile   BRISON,DEBBIE Sister   [phone]       Current Medical History  Patient Admitting Diagnosis: L thalamic/caudate head/basal ganglia CVA  History of Present Illness: pt is a 55 y/o male with PMH of  HTN, tobacco use and cocaine abuse who [facility] with c/o R sided weakness and slurred speech.  MRI revealed acute lacunar infarct int he thalamocapsular junction and chronic lacunar infarcts in the bilateral basal ganglia and left thalamus.  Stroke team was consulted and full workup revealed EF 55-60%, A1c 5.8, LDL 60.  UDS positive for cocaine and THC.  Felt stroke etiology likely due to small vessel disease.  Recommendations for smoking/cocaine cessation, and DAPT asa/plavix x3 weeks, then asa alone.  Start low-dose statin for stroke prevention.  Therapy evaluations were completed and pt was recommended for CIR.   Complete NIHSS TOTAL: 3  Patient's medical record from [place] has been reviewed by the rehabilitation admission coordinator and  physician.  Past Medical History  History reviewed. No pertinent past medical history.  Has the patient had major surgery during 100 days prior to admission? No  Family History   family history is not on file.  Current Medications  Current Facility-Administered Medications:    acetaminophen (TYLENOL) tablet 650 mg, 650 mg, Oral, Q4H PRN **OR** acetaminophen (TYLENOL) 160 MG/5ML solution 650 mg, 650 mg, Per Tube, Q4H PRN **OR** acetaminophen (TYLENOL) suppository 650 mg, 650 mg, Rectal, Q4H PRN, Wolfe, Allison, MD   aspirin chewable tablet 81 mg, 81 mg, Oral, Daily, Wolfe, Allison, MD, 81 mg at 01/12/21 0950   atorvastatin (LIPITOR) tablet 20 mg, 20 mg, Oral, Daily, Xu, Jindong, MD, 20 mg at 01/12/21 0950   clopidogrel (PLAVIX) tablet 75 mg, 75 mg, Oral, Daily, Xu, Jindong, MD, 75 mg at 01/12/21 0950   enoxaparin (LOVENOX) injection 40 mg, 40 mg, Subcutaneous, Q24H, Wolfe, Allison, MD, 40 mg at 01/11/21 1229   hydrALAZINE (APRESOLINE) injection 5 mg, 5 mg, Intravenous, Q6H PRN, Wolfe, Allison, MD   senna-docusate (Senokot-S) tablet 1 tablet, 1 tablet, Oral, QHS PRN,   Wolfe, Allison, MD  Patients Current Diet:  Diet Order             Diet Heart Room service appropriate? Yes; Fluid consistency: Thin  Diet effective now                   Precautions / Restrictions Precautions Precautions: Fall Precaution Comments: left weakness Restrictions Weight Bearing Restrictions: No   Has the patient had 2 or more falls or a fall with injury in the past year? No  Prior Activity Level Community (5-7x/wk): worked as a mechanic prior to admission, doesn't drive, no DME used prior to admit  Prior Functional Level Self Care: Did the patient need help bathing, dressing, using the toilet or eating? Independent  Indoor Mobility: Did the patient need assistance with walking from room to room (with or without device)? Independent  Stairs: Did the patient need assistance with internal or  external stairs (with or without device)? Independent  Functional Cognition: Did the patient need help planning regular tasks such as shopping or remembering to take medications? Independent  Patient Information Are you of Hispanic, Latino/a,or Spanish origin?: A. No, not of Hispanic, Latino/a, or Spanish origin What is your race?: A. White Do you need or want an interpreter to communicate with a doctor or health care staff?: 0. No  Patient's Response To:  Health Literacy and Transportation Is the patient able to respond to health literacy and transportation needs?: Yes Health Literacy - How often do you need to have someone help you when you read instructions, pamphlets, or other written material from your doctor or pharmacy?: Never In the past 12 months, has lack of transportation kept you from medical appointments or from getting medications?: No In the past 12 months, has lack of transportation kept you from meetings, work, or from getting things needed for daily living?: No  Home Assistive Devices / Equipment Home Assistive Devices/Equipment: None Home Equipment: None  Prior Device Use: Indicate devices/aids used by the patient prior to current illness, exacerbation or injury? None of the above  Current Functional Level Cognition  Overall Cognitive Status: Within Functional Limits for tasks assessed Orientation Level: Oriented X4 Safety/Judgement: Decreased awareness of safety, Decreased awareness of deficits General Comments: Follows one step commands accurately. A&O x4. Attention: Focused, Sustained Focused Attention: Appears intact Sustained Attention: Appears intact Memory: Appears intact    Extremity Assessment (includes Sensation/Coordination)  Upper Extremity Assessment: LUE deficits/detail LUE Deficits / Details: 3/5 elbow flexion, 3/5 grip strength, 3/5 shoulder flexion LUE Sensation: WNL LUE Coordination: WNL  Lower Extremity Assessment: LLE deficits/detail, RLE  deficits/detail RLE Deficits / Details: 5/5 knee extension/flexion and hip flexion LLE Deficits / Details: 4/5 knee extension, 3+/5 knee flexion, hip flexion 3+/5    ADLs  Overall ADL's : Needs assistance/impaired Grooming: Wash/dry hands, Minimal assistance, Standing Upper Body Dressing : Supervision/safety, Sitting Lower Body Dressing: Minimal assistance, Sitting/lateral leans Functional mobility during ADLs: Minimal assistance General ADL Comments: HHA    Mobility  Overal bed mobility: Modified Independent Bed Mobility: Supine to Sit Supine to sit: Modified independent (Device/Increase time), HOB elevated Sit to supine: Supervision General bed mobility comments: 30 degrees HOB with assist from bedrails to scoot hips EOB    Transfers  Overall transfer level: Needs assistance Equipment used: 1 person hand held assist Transfers: Sit to/from Stand Sit to Stand: Min assist Stand pivot transfers: Min assist General transfer comment: min assist for safety with sit to stand    Ambulation / Gait /   Stairs / Wheelchair Mobility  Ambulation/Gait Ambulation/Gait assistance: Min assist Gait Distance (Feet): 50 Feet Assistive device: 1 person hand held assist Gait Pattern/deviations: Step-through pattern, Decreased step length - left General Gait Details: poor attention to left side when ambulation. Almost running into sink each time. Poor coordination of left and leaning to left with gait. Requires min assist to maintain balance. Gait velocity: decr    Posture / Balance Dynamic Sitting Balance Sitting balance - Comments: Intermittent RUE support on bedrails Balance Overall balance assessment: Needs assistance Sitting-balance support: Feet supported Sitting balance-Leahy Scale: Fair Sitting balance - Comments: Intermittent RUE support on bedrails Postural control: Posterior lean Standing balance support: Single extremity supported, During functional activity Standing balance-Leahy  Scale: Poor Standing balance comment: Requires external and RUE support for ambulation    Special needs/care consideration N/A   Previous Home Environment (from acute therapy documentation) Living Arrangements: Non-relatives/Friends  Lives With: Friend(s) Available Help at Discharge: Available PRN/intermittently Type of Home: House Home Layout: One level Home Access: Stairs to enter Entrance Stairs-Rails: None Entrance Stairs-Number of Steps: 2 Bathroom Shower/Tub: Walk-in shower Bathroom Toilet: Standard Home Care Services: No  Discharge Living Setting Plans for Discharge Living Setting: Lives with (comment) (boss, Daryl) Type of Home at Discharge: Apartment (garage apartment) Discharge Home Layout: One level Discharge Home Access: Level entry Discharge Bathroom Shower/Tub: Tub/shower unit Discharge Bathroom Toilet: Standard Discharge Bathroom Accessibility: Yes How Accessible: Accessible via walker Does the patient have any problems obtaining your medications?: No  Social/Family/Support Systems Anticipated Caregiver: plan to d/c home to Daryl's house (garage apartment), with intermittent assist from family and friends, main contact is Debbie Brison (sister) Anticipated Caregiver's Contact Information: Debbie [phone] Caregiver Availability: Intermittent Discharge Plan Discussed with Primary Caregiver: Yes Is Caregiver In Agreement with Plan?: Yes Does Caregiver/Family have Issues with Lodging/Transportation while Pt is in Rehab?: No  Goals Patient/Family Goal for Rehab: PT/OT supervision to mod I, SLP mod I Expected length of stay: 12-14 days Pt/Family Agrees to Admission and willing to participate: Yes Program Orientation Provided & Reviewed with Pt/Caregiver Including Roles  & Responsibilities: Yes  Barriers to Discharge: Decreased caregiver support, Insurance for SNF coverage  Decrease burden of Care through IP rehab admission: n/a  Possible need for SNF  placement upon discharge: Not anticipated.   Patient Condition: I have reviewed medical records from [place], spoken with CM, and patient and family member. I met with patient at the bedside and discussed via phone for inpatient rehabilitation assessment.  Patient will benefit from ongoing PT, OT, and SLP, can actively participate in 3 hours of therapy a day 5 days of the week, and can make measurable gains during the admission.  Patient will also benefit from the coordinated team approach during an Inpatient Acute Rehabilitation admission.  The patient will receive intensive therapy as well as Rehabilitation physician, nursing, social worker, and care management interventions.  Due to safety, disease management, medication administration, pain management, and patient education the patient requires 24 hour a day rehabilitation nursing.  The patient is currently min assist with mobility and basic ADLs.  Discharge setting and therapy post discharge at home with home health is anticipated.  Patient has agreed to participate in the Acute Inpatient Rehabilitation Program and will admit today.  Preadmission Screen Completed By:  Westly Hinnant E Analea Muller, PT, DPT 01/12/2021 11:18 AM ______________________________________________________________________   Discussed status with Dr. Patel on 01/12/21  at 11:20 AM  and received approval for admission today.  Admission Coordinator:  Elyna Pangilinan E   Anushree Dorsi, PT, DPT time 11:20 AM /Date 01/12/21    Assessment/Plan: Diagnosis: L thalamic/caudate head/basal ganglia CVA  Does the need for close, 24 hr/day Medical supervision in concert with the patient's rehab needs make it unreasonable for this patient to be served in a less intensive setting? Yes Co-Morbidities requiring supervision/potential complications: HTN (monitor and provide prns in accordance with increased physical exertion and pain), tobacco use and cocaine abuse  Due to safety, disease management, medication  administration, and patient education, does the patient require 24 hr/day rehab nursing? Yes Does the patient require coordinated care of a physician, rehab nurse, PT, OT, and SLP to address physical and functional deficits in the context of the above medical diagnosis(es)? Yes Addressing deficits in the following areas: balance, endurance, locomotion, strength, transferring, bathing, dressing, toileting, cognition, and psychosocial support Can the patient actively participate in an intensive therapy program of at least 3 hrs of therapy 5 days a week? Yes The potential for patient to make measurable gains while on inpatient rehab is excellent Anticipated functional outcomes upon discharge from inpatient rehab: modified independent and supervision PT, modified independent and supervision OT, modified independent SLP Estimated rehab length of stay to reach the above functional goals is: 7-11 days.  Anticipated discharge destination: Home 10. Overall Rehab/Functional Prognosis: good   MD Signature: Ankit Patel, MD, ABPMR 

## 2021-01-13 DIAGNOSIS — I639 Cerebral infarction, unspecified: Secondary | ICD-10-CM | POA: Diagnosis not present

## 2021-01-13 LAB — COMPREHENSIVE METABOLIC PANEL
ALT: 19 U/L (ref 0–44)
AST: 18 U/L (ref 15–41)
Albumin: 3.5 g/dL (ref 3.5–5.0)
Alkaline Phosphatase: 51 U/L (ref 38–126)
Anion gap: 7 (ref 5–15)
BUN: 21 mg/dL — ABNORMAL HIGH (ref 6–20)
CO2: 24 mmol/L (ref 22–32)
Calcium: 9.3 mg/dL (ref 8.9–10.3)
Chloride: 103 mmol/L (ref 98–111)
Creatinine, Ser: 1.13 mg/dL (ref 0.61–1.24)
GFR, Estimated: >60 mL/min (ref >60)
Glucose, Bld: 107 mg/dL — ABNORMAL HIGH (ref 70–99)
Potassium: 4.3 mmol/L (ref 3.5–5.1)
Sodium: 134 mmol/L — ABNORMAL LOW (ref 135–145)
Total Bilirubin: 0.4 mg/dL (ref 0.3–1.2)
Total Protein: 6.5 g/dL (ref 6.5–8.1)

## 2021-01-13 LAB — CBC WITH DIFFERENTIAL/PLATELET
Abs Immature Granulocytes: 0.04 10*3/uL (ref 0.00–0.07)
Basophils Absolute: 0.1 10*3/uL (ref 0.0–0.1)
Basophils Relative: 1 %
Eosinophils Absolute: 0.3 10*3/uL (ref 0.0–0.5)
Eosinophils Relative: 2 %
HCT: 45.4 % (ref 39.0–52.0)
Hemoglobin: 15.7 g/dL (ref 13.0–17.0)
Immature Granulocytes: 0 %
Lymphocytes Relative: 17 %
Lymphs Abs: 2 10*3/uL (ref 0.7–4.0)
MCH: 30.3 pg (ref 26.0–34.0)
MCHC: 34.6 g/dL (ref 30.0–36.0)
MCV: 87.5 fL (ref 80.0–100.0)
Monocytes Absolute: 0.8 10*3/uL (ref 0.1–1.0)
Monocytes Relative: 7 %
Neutro Abs: 9 10*3/uL — ABNORMAL HIGH (ref 1.7–7.7)
Neutrophils Relative %: 73 %
Platelets: 251 10*3/uL (ref 150–400)
RBC: 5.19 MIL/uL (ref 4.22–5.81)
RDW: 11.9 % (ref 11.5–15.5)
WBC: 12.2 10*3/uL — ABNORMAL HIGH (ref 4.0–10.5)
nRBC: 0 % (ref 0.0–0.2)

## 2021-01-13 NOTE — Progress Notes (Signed)
Inpatient Rehabilitation Care Coordinator Assessment and Plan Patient Details  Name: Christopher [name] MRN: [id number] Date of Birth: 12-24-1965  Today's Date: 01/13/2021  Hospital Problems: Principal Problem:   Acute CVA (cerebrovascular accident) [facility])  Past Medical History:  Past Medical History:  Diagnosis Date   PUD (peptic ulcer disease) 2008   treated with meds   Past Surgical History: History reviewed. No pertinent surgical history. Social History:  reports that he has been smoking cigarettes. He has been smoking an average of .5 packs per day. He has quit using smokeless tobacco. He reports current alcohol use. He reports current drug use. Drug: "Crack" cocaine.  Family / Support Systems Other Supports: Debbie (sister) Anticipated Caregiver: Coralyn Pear (boss), Debbie (sister), other friends, and family Ability/Limitations of Caregiver: intermittent A Caregiver Availability: Intermittent Family Dynamics: Patient has support from boss, sibling and other family and friends  Social History Preferred language: English Religion: Non-Denominational Cultural Background: n/a Education: 9th grade Health Literacy - How often do you need to have someone help you when you read instructions, pamphlets, or other written material from your doctor or pharmacy?: Sometimes Writes: Yes 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: n/a Guardian/Conservator: n/a   Abuse/Neglect Abuse/Neglect Assessment Can Be Completed: Yes Physical Abuse: Denies Verbal Abuse: Denies Sexual Abuse: Denies Exploitation of patient/patient's resources: Denies Self-Neglect: Denies  Patient response to: Social Isolation - How often do you feel lonely or isolated from those around you?: Never  Emotional Status Recent Psychosocial Issues: coping, substance use Psychiatric History: n/a Substance Abuse History: hx Tobacco, Cocaine  Patient / Family Perceptions,  Expectations & Goals Pt/Family understanding of illness & functional limitations: yes Premorbid pt/family roles/activities: Pt independent previously overall Anticipated changes in roles/activities/participation: Family and friends able to provide assistance with roles and task at home Pt/family expectations/goals: Supervision to [place]: None Premorbid Home Care/DME Agencies: None Transportation available at discharge: Patient does not drive. Will reach out to family for transportation Is the patient able to respond to transportation needs?: Yes In the past 12 months, has lack of transportation kept you from medical appointments or from getting medications?: No In the past 12 months, has lack of transportation kept you from meetings, work, or from getting things needed for daily living?: No Resource referrals recommended: Neuropsychology (coping, substance)  Discharge Planning Living Arrangements: Other relatives, Non-relatives/Friends Support Systems: Other relatives, Friends/neighbors ([employer] Apartment (Pt boss)) Type of Residence: Private residence (1 level home, levem entry) Insurance Resources: Teacher, adult education Resources: Family Support Financial Screen Referred: Yes Living Expenses: Lives with family Money Management: Patient, Other (Comment) (friend and sister) Does the patient have any problems obtaining your medications?: No Home Management: Independent Patient/Family Preliminary Plans: Sister and friend able to provide assistance with money and medication management Care Coordinator Barriers to Discharge: Insurance for SNF coverage, Decreased caregiver support, Lack of/limited family support Expected length of stay: 12-14 Days  Clinical Impression SW met with pt, introduced self, explained role and addressed questions and concerns. Pt is uninsured. Pt plans to d/c home with his friend and spouse, with intermittent A from sister,  friends and family. No additional questions or concerns, sw will cont to follow up with pt.   Dyanne Iha 01/13/2021, 12:22 PM

## 2021-01-13 NOTE — Progress Notes (Addendum)
PROGRESS NOTE   Subjective/Complaints: No complaints this morning Admission labs stable with exception of rising WBC  ROS: denies pain   Objective:   No results found. Recent Labs    01/12/21 0206 01/13/21 0551  WBC 9.8 12.2*  HGB 15.4 15.7  HCT 44.9 45.4  PLT 281 251   Recent Labs    01/12/21 0206 01/13/21 0551  NA 134* 134*  K 4.0 4.3  CL 103 103  CO2 25 24  GLUCOSE 104* 107*  BUN 14 21*  CREATININE 1.10 1.13  CALCIUM 9.2 9.3    Intake/Output Summary (Last 24 hours) at 01/13/2021 1308 Last data filed at 01/13/2021 1100 Gross per 24 hour  Intake 630 ml  Output 1075 ml  Net -445 ml        Physical Exam: Vital Signs Blood pressure (!) 174/85, pulse 76, temperature 98.3 F (36.8 C), temperature source Oral, resp. rate 16, height 5\' 9"  (1.753 m), weight 52.6 kg, SpO2 99 %. Gen: no distress, normal appearing HEENT: oral mucosa pink and moist, NCAT Cardio: Reg rate Chest: normal effort, normal rate of breathing Abdominal:     General: There is distension.     Comments: Hypoactive bowel sounds  Musculoskeletal:     Cervical back: Normal range of motion and neck supple.     Comments: No edema or tenderness in extremities  Skin:    General: Skin is warm and dry.     Comments: Irritation along beard hairline  Neurological:     Mental Status: He is alert.     Comments: Alert and oriented to person and place Motor: RUE/RLE: 5/5 proximal distally in LUE/LE: 4/5 proximal distal Dysarthria  Psychiatric:     Comments: Slightly delayed and slowed    Assessment/Plan: 1. Functional deficits which require 3+ hours per day of interdisciplinary therapy in a comprehensive inpatient rehab setting. Physiatrist is providing close team supervision and 24 hour management of active medical problems listed below. Physiatrist and rehab team continue to assess barriers to discharge/monitor patient progress toward  functional and medical goals  Care Tool:  Bathing              Bathing assist       Upper Body Dressing/Undressing Upper body dressing        Upper body assist Assist Level: Supervision/Verbal cueing    Lower Body Dressing/Undressing Lower body dressing            Lower body assist Assist for lower body dressing: Minimal Assistance - Patient > 75%     Toileting Toileting    Toileting assist Assist for toileting: Minimal Assistance - Patient > 75%     Transfers Chair/bed transfer  Transfers assist           Locomotion Ambulation   Ambulation assist              Walk 10 feet activity   Assist           Walk 50 feet activity   Assist           Walk 150 feet activity   Assist  Walk 10 feet on uneven surface  activity   Assist           Wheelchair     Assist               Wheelchair 50 feet with 2 turns activity    Assist            Wheelchair 150 feet activity     Assist          Blood pressure (!) 174/85, pulse 76, temperature 98.3 F (36.8 C), temperature source Oral, resp. rate 16, height 5\' 9"  (1.753 m), weight 52.6 kg, SpO2 99 %.  Medical Problem List and Plan: 1.  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             -patient may shower             -ELOS/Goals: 13-16 days/Supervision/min a             Initial CIR evaluations today 2.  Antithrombotics: -DVT/anticoagulation:  Pharmaceutical: Lovenox             -antiplatelet therapy: DAPT X 3 weeks followed by ASA alone.  3. Pain Management: Tylenol prn.  4. Mood: LCSW to follow for evaluation and support.              -antipsychotic agents: N/A 5. Neuropsych: This patient is not capable of making decisions on his own behalf. 6. Skin/Wound Care: Routine  pressure relief measures.  7. Fluids/Electrolytes/Nutrition: Monitor I/Os             CMP ordered for tomorrow a.m. 8. HTN: Monitor BP TID w/permissive HTN for 5-7 days to  normalize OK for <220/120 (avoid hypoperfusion)              Monitor his increase mobility 9. Hyperlipidemia: LDL at [facility] but HDL<265. Now on low dose statin- Lipitor 20 mg/day 10. Polysubstance abuse:              Counseled on appropriate 11. Leucocytosis: Resolving-->monitor for signs of infection.              worsening. No signs of infection- repeat tomorrow to trend 12. Hyponatremia:             stable, monitor weekly 13. Mild anorexia BMI 17.12- provide dietary education.     LOS: 1 days A FACE TO FACE EVALUATION WAS PERFORMED  Christopher [name] 01/13/2021, 1:08 PM

## 2021-01-13 NOTE — Progress Notes (Signed)
Inpatient Rehabilitation Center Individual Statement of Services  Patient Name:  Christopher [name]  Date:  01/13/2021  Welcome to the Inpatient Rehabilitation Center.  Our goal is to provide you with an individualized program based on your diagnosis and situation, designed to meet your specific needs.  With this comprehensive rehabilitation program, you will be expected to participate in at least 3 hours of rehabilitation therapies Monday-Friday, with modified therapy programming on the weekends.  Your rehabilitation program will include the following services:  Physical Therapy (PT), Occupational Therapy (OT), Speech Therapy (ST), 24 hour per day rehabilitation nursing, Therapeutic Recreaction (TR), Neuropsychology, Care Coordinator, Rehabilitation Medicine, Nutrition Services, Pharmacy Services, and Other  Weekly team conferences will be held on Wednesdays to discuss your progress.  Your Inpatient Rehabilitation Care Coordinator will talk with you frequently to get your input and to update you on team discussions.  Team conferences with you and your family in attendance may also be held.  Expected length of stay:  12-14 Days  Overall anticipated outcome:  Supervision to MOD I   Depending on your progress and recovery, your program may change. Your Inpatient Rehabilitation Care Coordinator will coordinate services and will keep you informed of any changes. Your Inpatient Rehabilitation Care Coordinator's name and contact numbers are listed  below.  The following services may also be recommended but are not provided by the Inpatient Rehabilitation Center:   Home Health Rehabiltiation Services Outpatient Rehabilitation Services    Arrangements will be made to provide these services after discharge if needed.  Arrangements include referral to agencies that provide these services.  Your insurance has been verified to be:  uninsured Your primary doctor is:  NO PCP  Pertinent information will be  shared with your doctor and your insurance company.  Inpatient Rehabilitation Care Coordinator:  [name], [place] [phone] or [phone]  Information discussed with and copy given to patient by: Andria Rhein, 01/13/2021, 11:47 AM

## 2021-01-13 NOTE — Progress Notes (Signed)
Inpatient Rehabilitation  Patient information reviewed and entered into eRehab system by Mariadejesus Cade M. Elkin Belfield, M.A., CCC/SLP, PPS Coordinator.  Information including medical coding, functional ability and quality indicators will be reviewed and updated through discharge.    

## 2021-01-13 NOTE — Evaluation (Signed)
Occupational Therapy Assessment and Plan  Patient Details  Name: Christopher [name] MRN: [id number] Date of Birth: [birth year]  OT Diagnosis: abnormal posture, hemiplegia affecting dominant side, and muscle weakness (generalized) Rehab Potential: Rehab Potential (ACUTE ONLY): Excellent ELOS: 10-12 days   Today's Date: 01/13/2021 OT Individual Time: 4854-6270 OT Individual Time Calculation (min): 57 min     Hospital Problem: Principal Problem:   Acute CVA (cerebrovascular accident) [facility])   Past Medical History:  Past Medical History:  Diagnosis Date   PUD (peptic ulcer disease) 2008   treated with meds   Past Surgical History: History reviewed. No pertinent surgical history.  Assessment & Plan Clinical Impression: Patient is a 54 y.o. year old male with recent admission to the hospital on 01/10/2021 with one day history of slurred speech and left hemiparesis.  History taken from chart review and patient. 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 MRI brain done revealing 11 mm acute right thalamocapsular junction and 5 mm probable subacute infarct within the subcortical white matter in right frontal lobe.  Patient transferred to CIR on 01/12/2021 .    Patient currently requires min with basic self-care skills secondary to muscle weakness and muscle paralysis, impaired timing and sequencing, unbalanced muscle activation, and decreased coordination, and decreased standing balance, hemiplegia, and decreased balance strategies.  Prior to hospitalization, patient could complete ADLs with independent .  Patient will benefit from skilled intervention to decrease level of assist with basic self-care skills and increase independence with basic self-care skills prior to discharge home with care partner.  Anticipate patient will require 24 hour supervision and follow up outpatient.  OT - End of Session Activity Tolerance:  Improving Endurance Deficit: No OT Assessment Rehab Potential (ACUTE ONLY): Excellent OT Barriers to Discharge: Inaccessible home environment OT Barriers to Discharge Comments: Lived in a garage OT Patient demonstrates impairments in the following area(s): Balance;Motor;Sensory OT Basic ADL's Functional Problem(s): Eating;Grooming;Bathing;Dressing;Toileting OT Advanced ADL's Functional Problem(s): Simple Meal Preparation OT Transfers Functional Problem(s): Toilet;Tub/Shower OT Additional Impairment(s): Fuctional Use of Upper Extremity OT Plan OT Intensity: Minimum of 1-2 x/day, 45 to 90 minutes OT Frequency: 5 out of 7 days OT Duration/Estimated Length of Stay: 10-12 days OT Treatment/Interventions: Balance/vestibular training;Functional electrical stimulation;Pain management;Self Care/advanced ADL retraining;Therapeutic Activities;UE/LE Coordination activities;Cognitive remediation/compensation;Functional mobility training;Patient/family education;Therapeutic Exercise;Community reintegration;Disease Lawyer;Neuromuscular re-education;UE/LE Strength taining/ROM;Psychosocial support;Visual/perceptual remediation/compensation OT Self Feeding Anticipated Outcome(s): modified independent OT Basic Self-Care Anticipated Outcome(s): supervision to modified independent OT Toileting Anticipated Outcome(s): supervision OT Bathroom Transfers Anticipated Outcome(s): supervision OT Recommendation Patient destination: Home Follow Up Recommendations: 24 hour supervision/assistance;Outpatient OT Equipment Recommended: To be determined   OT Evaluation Precautions/Restrictions  Precautions Precautions: Fall Precaution Comments: L hemiparesis Restrictions Weight Bearing Restrictions: No  Pain Pain Assessment Pain Scale: 0-10 Pain Score: 0-No pain Home Living/Prior Functioning Home Living Living Arrangements: Other relatives,  Non-relatives/Friends Available Help at Discharge: Available PRN/intermittently Type of Home: House Home Access: Level entry Entrance Stairs-Rails: None Home Layout: One level Biochemist, clinical: Standard Bathroom Accessibility: No  Lives With: Friend(s) IADL History Homemaking Responsibilities: Yes Current License: Yes Education: 9th grade Occupation: Full time employment 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 Level of Independence: Independent with basic ADLs, Independent with homemaking with ambulation  Able to Take Stairs?: Yes Driving: Yes Vocation: Full time employment Vocation Requirements: as a Dealer Comments: completely independent Vision Baseline Vision/History: 1 Wears glasses (near vision only,did not have them with him) Ability  to See in Adequate Light: 0 Adequate Patient Visual Report: No change from baseline Vision Assessment?: Yes Eye Alignment: Within Functional Limits Ocular Range of Motion: Within Functional Limits Alignment/Gaze Preference: Within Defined Limits Tracking/Visual Pursuits: Decreased smoothness of horizontal tracking;Decreased smoothness of vertical tracking Convergence: Within functional limits Visual Fields: No apparent deficits Perception  Perception: Within Functional Limits Praxis Praxis: Intact Cognition Overall Cognitive Status: Within Functional Limits for tasks assessed Arousal/Alertness: Awake/alert Orientation Level: Person;Place;Situation Person: Oriented Place: Oriented Situation: Oriented Year: 2022 Month: September Day of Week: Correct Memory: Appears intact Memory Recall Sock: Without Cue Memory Recall Blue: Without Cue Memory Recall Bed: Not able to recall Attention: Focused;Sustained Focused Attention: Appears intact Sustained Attention: Appears intact Awareness: Appears intact Safety/Judgment: Appears intact Sensation Sensation Light Touch: Appears Intact Hot/Cold: Not  tested Proprioception: Impaired Detail (decreased in the left hand) Proprioception Impaired Details: Impaired LUE Stereognosis: Not tested Coordination Gross Motor Movements are Fluid and Coordinated: No Fine Motor Movements are Fluid and Coordinated: No Coordination and Movement Description: 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 Motor  Motor Motor: Hemiplegia Motor - Skilled Clinical Observations: Mild LUE and LLE hemiparesis  Trunk/Postural Assessment  Cervical Assessment Cervical Assessment: Within Functional Limits Thoracic Assessment Thoracic Assessment: Within Functional Limits Lumbar Assessment Lumbar Assessment: Within Functional Limits Postural Control Postural Control: Within Functional Limits  Balance Balance Balance Assessed: Yes Static Sitting Balance Static Sitting - Balance Support: Feet supported Static Sitting - Level of Assistance: 5: Stand by assistance Dynamic Sitting Balance Dynamic Sitting - Balance Support: Feet supported Dynamic Sitting - Level of Assistance: 4: Min assist Static Standing Balance Static Standing - Balance Support: During functional activity Static Standing - Level of Assistance: 4: Min assist Dynamic Standing Balance Dynamic Standing - Balance Support: During functional activity Dynamic Standing - Level of Assistance: 3: Mod assist Extremity/Trunk Assessment RUE Assessment RUE Assessment: Within Functional Limits LUE Assessment LUE Assessment: Exceptions to [facility] Passive Range of Motion (PROM) Comments: WFLs Active Range of Motion (AROM) Comments: Brunnstrum stage IV in the arm and stage IV in the hand.  Decreased ability to oppose thumb to digits 2-5 with decreased FM coordination noted overall.  Synergy pattern noted with shoulder flexion, but improving.  Care Tool Care Tool Self Care Eating   Eating Assist Level: Set up assist    Oral Care  Oral care, brush teeth, clean dentures activity did not occur: Refused       Bathing   Body parts bathed by patient: Left arm;Chest;Abdomen;Front perineal area;Buttocks;Right upper leg;Left upper leg;Face;Left lower leg;Right lower leg     Assist Level: Minimal Assistance - Patient > 75%    Upper Body Dressing(including orthotics)   What is the patient wearing?: Pull over shirt   Assist Level: Minimal Assistance - Patient > 75%    Lower Body Dressing (excluding footwear)   What is the patient wearing?: Pants Assist for lower body dressing: Minimal Assistance - Patient > 75%    Putting on/Taking off footwear   What is the patient wearing?: Non-skid slipper socks Assist for footwear: Minimal Assistance - Patient > 75%       Care Tool Toileting Toileting activity   Assist for toileting: Minimal Assistance - Patient > 75%     Care Tool Bed Mobility Roll left and right activity   Roll left and right assist level: Supervision/Verbal cueing    Sit to lying activity        Lying to sitting on side of bed activity  Lying to sitting on side of bed assist level: the ability to move from lying on the back to sitting on the side of the bed with no back support.: Supervision/Verbal cueing     Care Tool Transfers Sit to stand transfer   Sit to stand assist level: Minimal Assistance - Patient > 75%    Chair/bed transfer   Chair/bed transfer assist level: Minimal Assistance - Patient > 75%     Toilet transfer   Assist Level: Minimal Assistance - Patient > 75%     Care Tool Cognition  Expression of Ideas and Wants Expression of Ideas and Wants: 3. Some difficulty - exhibits some difficulty with expressing needs and ideas (e.g, some words or finishing thoughts) or speech is not clear  Understanding Verbal and Non-Verbal Content Understanding Verbal and Non-Verbal Content: 4. Understands (complex and basic) - clear comprehension without cues or repetitions   Memory/Recall Ability Memory/Recall Ability : That he or she is in a hospital/hospital unit;Current  season   Refer to Care Plan for Long Term Goals  SHORT TERM GOAL WEEK 1 OT Short Term Goal 1 (Week 1): Pt will complete LB selfcare sit to stand with min guard assist. OT Short Term Goal 2 (Week 1): Pt will complete toilet transfers with min guard using LRAD. OT Short Term Goal 3 (Week 1): Pt will use the LUE at a diminished level to donn clothing with supervision overall when threading pants, socks, shirt.  Recommendations for other services: None    Skilled Therapeutic Intervention ADL ADL Eating: Supervision/safety Where Assessed-Eating: Chair Grooming: Minimal assistance Where Assessed-Grooming: Edge of bed Upper Body Bathing: Minimal assistance Where Assessed-Upper Body Bathing: Edge of bed Lower Body Bathing: Minimal assistance Where Assessed-Lower Body Bathing: Edge of bed Upper Body Dressing: Minimal assistance Where Assessed-Upper Body Dressing: Edge of bed Lower Body Dressing: Minimal assistance Where Assessed-Lower Body Dressing: Edge of bed Toileting: Minimal assistance Where Assessed-Toileting: Bedside Commode Toilet Transfer: Minimal assistance Toilet Transfer Method: Counselling psychologist: Radiographer, therapeutic: Not assessed Social research officer, government: Not assessed Mobility  Bed Mobility Bed Mobility: Supine to Sit Supine to Sit: Supervision/Verbal cueing Transfers Sit to Stand: Minimal Assistance - Patient > 75% Stand to Sit: Minimal Assistance - Patient > 75%  Session Note:  Pt completed bathing and dressing sit to stand from the EOB.  He needed overall mod assist for integration of the LUE for washing the RUE as well as when attempting to donn his gripper socks.  Min assist for sit to stand with supervision for static sitting posture and min guard for dynamic sitting.  Min assist for functional mobility to the room door and back to the bedside recliner to rest.  Discussed with pt on ELOS and expected need for close supervision at  discharge.  He reports that he may be able to stay with his sister as he knows it will not be safe to return back to the garage at this time.  Educated him on LUE shoulder flexion AAROM as well as finger opposition, and opening and closing his deodorant to work on [employer].    Discharge Criteria: Patient will be discharged from OT if patient refuses treatment 3 consecutive times without medical reason, if treatment goals not met, if there is a change in medical status, if patient makes no progress towards goals or if patient is discharged from hospital.  The above assessment, treatment plan, treatment alternatives and goals were discussed and mutually agreed upon: by patient  [facility]  OTR/L 01/13/2021, 4:32 PM

## 2021-01-13 NOTE — Evaluation (Signed)
Physical Therapy Assessment and Plan  Patient Details  Name: Christopher [name] MRN: [id number] Date of Birth: 07/13/1965  PT Diagnosis: Abnormality of gait, Difficulty walking, Hemiparesis dominant, Impaired sensation, and Muscle weakness Rehab Potential: Good ELOS: ~1.5-2 weeks   Today's Date: 01/13/2021 PT Individual Time: 0802-0917 PT Individual Time Calculation (min): 75 min    Hospital Problem: Principal Problem:   Acute CVA (cerebrovascular accident) [facility])   Past Medical History:  Past Medical History:  Diagnosis Date   PUD (peptic ulcer disease) 2008   treated with meds   Past Surgical History: History reviewed. No pertinent surgical history.  Assessment & Plan Clinical Impression: Patient is a 55 y.o. LH-male with history of tobacco use and cocaine use otherwise in good health who was admitted on 01/10/2021 with one day history of slurred speech and left hemiparesis.  History taken from chart review and patient. 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 MRI brain done revealing 11 mm acute right thalamocapsular junction and 5 mm probable subacute infarct within the subcortical white matter in right frontal lobe.  Echocardiogram with ejection fraction of 55-60% and mild LVH. UDS positive for cocaine and THC. Dr. Erlinda Hong felt that stroke likely due to cocaine use and recommended DAPT x3 weeks followed by ASA alone. Patient has been educated on importance of cessation/lifestyle changes. Therapy evaluations completed revealing mild dysarthria with unsteady gait and balance deficits. CIR recommended due to functional decline. Patient transferred to CIR on 01/12/2021 .   Patient currently requires min assist with mobility secondary to muscle weakness, decreased cardiorespiratoy endurance, impaired timing and sequencing and unbalanced muscle activation, and decreased standing balance, decreased postural control, hemiplegia, and  decreased balance strategies.  Prior to hospitalization, patient was independent  with mobility and lived with Friend(s) in a House home.  Home access is  Level entry.  Patient will benefit from skilled PT intervention to maximize safe functional mobility, minimize fall risk, and decrease caregiver burden for planned discharge home with 24 hour supervision.  Anticipate patient will benefit from follow up OP at discharge.  PT - End of Session Activity Tolerance: Tolerates 30+ min activity with multiple rests Endurance Deficit: Yes Endurance Deficit Description: requires seated rest breaks and reports fatigue at end of session PT Assessment Rehab Potential (ACUTE/IP ONLY): Good PT Barriers to Discharge: Decreased caregiver support PT Patient demonstrates impairments in the following area(s): Balance;Perception;Behavior;Safety;Edema;Sensory;Endurance;Skin Integrity;Motor;Nutrition;Pain PT Transfers Functional Problem(s): Bed Mobility;Bed to Chair;Car;Furniture;Floor PT Locomotion Functional Problem(s): Ambulation;Stairs PT Plan PT Intensity: Minimum of 1-2 x/day ,45 to 90 minutes PT Frequency: 5 out of 7 days PT Duration Estimated Length of Stay: ~1.5-2 weeks PT Treatment/Interventions: Ambulation/gait training;Community reintegration;DME/adaptive equipment instruction;Neuromuscular re-education;Psychosocial support;Stair training;UE/LE Strength taining/ROM;Balance/vestibular training;Discharge planning;Functional electrical stimulation;Pain management;Skin care/wound management;Therapeutic Activities;UE/LE Coordination activities;Cognitive remediation/compensation;Disease management/prevention;Functional mobility training;Patient/family education;Splinting/orthotics;Therapeutic Exercise;Visual/perceptual remediation/compensation PT Transfers Anticipated Outcome(s): supervision using LRAD PT Locomotion Anticipated Outcome(s): supervision using LRAD PT Recommendation Follow Up Recommendations:  Outpatient PT;24 hour supervision/assistance Patient destination: Home Equipment Recommended: To be determined   PT Evaluation Precautions/Restrictions  Precautions Precautions: Fall Precaution Comments: L hemiparesis (monitor L ankle position) Pain Pain Assessment Pain Scale: 0-10 Pain Score: 0-No pain Pain Interference Pain Interference Pain Effect on Sleep: 0. Does not apply - I have not had any pain or hurting in the past 5 days Pain Interference with Therapy Activities: 1. Rarely or not at all Pain Interference with Day-to-Day Activities: 1. Rarely or not at all Home Living/Prior Functioning  Home Living Available Help at Discharge: Available PRN/intermittently Type of Home: House Home Access: Level entry Home Layout: One level  Lives With: Friend(s) (lives in the garage of his boss's house) Prior Function Level of Independence: Independent with gait;Independent with transfers;Independent with homemaking with ambulation  Able to Take Stairs?: Yes Driving: Yes Vocation: Full time employment Vocation Requirements: as a Dealer Comments: completely independent Perception   Perception Perception: Within Functional Limits Praxis Praxis: Intact  Cognition Overall Cognitive Status: Within Functional Limits for tasks assessed Arousal/Alertness: Awake/alert Orientation Level: Oriented X4 Year: 2022 Month: September Day of Week: Correct Attention: Focused;Sustained Focused Attention: Appears intact Sustained Attention: Appears intact Safety/Judgment: Appears intact Sensation Sensation Light Touch: Appears Intact Hot/Cold: Not tested Proprioception: Impaired by gross assessment (pt sitting on L hand without awareness of this) Proprioception Impaired Details: Impaired LUE Stereognosis: Not tested Coordination Gross Motor Movements are Fluid and Coordinated: No Coordination and Movement Description: gross motor movements impaired due to L hemiparesis Heel Shin Test:  slower, less controlled movements with L LE due to paresis but not true ataxia Motor  Motor Motor: Hemiplegia Motor - Skilled Clinical Observations: Mild LUE and LLE hemiparesis   Trunk/Postural Assessment  Cervical Assessment Cervical Assessment: Within Functional Limits Thoracic Assessment Thoracic Assessment: Within Functional Limits Lumbar Assessment Lumbar Assessment: Within Functional Limits  Balance Balance Balance Assessed: Yes Standardized Balance Assessment Standardized Balance Assessment: Berg Balance Test Berg Balance Test Sit to Stand: Needs minimal aid to stand or to stabilize Standing Unsupported: Able to stand 2 minutes with supervision Sitting with Back Unsupported but Feet Supported on Floor or Stool: Able to sit 2 minutes under supervision Stand to Sit: Uses backs of legs against chair to control descent Transfers: Needs one person to assist Standing Unsupported with Eyes Closed: Able to stand 10 seconds with supervision Standing Ubsupported with Feet Together: Needs help to attain position and unable to hold for 15 seconds From Standing, Reach Forward with Outstretched Arm: Reaches forward but needs supervision From Standing Position, Pick up Object from Floor: Unable to try/needs assist to keep balance From Standing Position, Turn to Look Behind Over each Shoulder: Needs assist to keep from losing balance and falling Turn 360 Degrees: Needs assistance while turning Standing Unsupported, Alternately Place Feet on Step/Stool: Needs assistance to keep from falling or unable to try Standing Unsupported, One Foot in Front: Needs help to step but can hold 15 seconds Standing on One Leg: Unable to try or needs assist to prevent fall Total Score: 15 Static Sitting Balance Static Sitting - Balance Support: Feet supported Static Sitting - Level of Assistance: 5: Stand by assistance Dynamic Sitting Balance Dynamic Sitting - Balance Support: Feet supported Dynamic  Sitting - Level of Assistance: 4: Min assist Static Standing Balance Static Standing - Balance Support: During functional activity Static Standing - Level of Assistance: 4: Min assist Dynamic Standing Balance Dynamic Standing - Balance Support: During functional activity Dynamic Standing - Level of Assistance: 4: Min assist;3: Mod assist Extremity Assessment      RLE Assessment RLE Assessment: Within Functional Limits Active Range of Motion (AROM) Comments: WFL General Strength Comments: Grossly 4+/5 to 5/5 assessed in supine LLE Assessment LLE Assessment: Exceptions to [facility] Active Range of Motion (AROM) Comments: WFL LLE Strength Left Hip Flexion: 3/5 Left Knee Flexion: 3-/5 Left Knee Extension: 3/5 Left Ankle Dorsiflexion: 3/5 Left Ankle Plantar Flexion: 3+/5  Care Tool Care Tool Bed Mobility Roll left and right activity   Roll left and right assist level:  Supervision/Verbal cueing    Sit to lying activity   Sit to lying assist level: Supervision/Verbal cueing    Lying to sitting on side of bed activity   Lying to sitting on side of bed assist level: the ability to move from lying on the back to sitting on the side of the bed with no back support.: Supervision/Verbal cueing     Care Tool Transfers Sit to stand transfer   Sit to stand assist level: Minimal Assistance - Patient > 75%    Chair/bed transfer   Chair/bed transfer assist level: Minimal Assistance - Patient > 75%     Physiological scientist transfer assist level: Moderate Assistance - Patient 50 - 74%      Care Tool Locomotion Ambulation   Assist level: Minimal Assistance - Patient > 75% Assistive device: No Device Max distance: 274f  Walk 10 feet activity   Assist level: Minimal Assistance - Patient > 75% Assistive device: No Device   Walk 50 feet with 2 turns activity   Assist level: Minimal Assistance - Patient > 75% Assistive device: No Device  Walk 150 feet activity    Assist level: Minimal Assistance - Patient > 75% Assistive device: No Device  Walk 10 feet on uneven surfaces activity   Assist level: Minimal Assistance - Patient > 75% Assistive device: Other (comment) (none)  Stairs   Assist level: Moderate Assistance - Patient - 50 - 74% Stairs assistive device: 2 hand rails Max number of stairs: 12  Walk up/down 1 step activity   Walk up/down 1 step (curb) assist level: Moderate Assistance - Patient - 50 - 74% Walk up/down 1 step or curb assistive device: 2 hand rails    Walk up/down 4 steps activity Walk up/down 4 steps assist level: Moderate Assistance - Patient - 50 - 74% Walk up/down 4 steps assistive device: 2 hand rails  Walk up/down 12 steps activity   Walk up/down 12 steps assist level: Moderate Assistance - Patient - 50 - 74% Walk up/down 12 steps assistive device: 2 hand rails  Pick up small objects from floor   Pick up small object from the floor assist level: Minimal Assistance - Patient > 75%    Wheelchair Is the patient using a wheelchair?: No          Wheel 50 feet with 2 turns activity      Wheel 150 feet activity        Refer to Care Plan for Long Term Goals  SHORT TERM GOAL WEEK 1 PT Short Term Goal 1 (Week 1): Pt will perform sit<>stands using LRAD with CGA PT Short Term Goal 2 (Week 1): Pt will perform bed<>chair transfers using LRAD with CGA PT Short Term Goal 3 (Week 1): Pt will ambulate at least 1070fusing LRAD with CGA PT Short Term Goal 4 (Week 1): Pt will ascend/descend 4 steps using HRs with CGA  Recommendations for other services: None   Skilled Therapeutic Intervention Pt received supine in bed and agreeable to therapy session. Evaluation completed (see details above) with patient education regarding purpose of PT evaluation, PT POC and goals, therapy schedule, weekly team meetings, and other CIR information including safety plan and fall risk safety. Pt completed the below mobility tasks with the  specified levels of assistance. Therapist cuing throughout for improved L LE positioning and balance. During gait training pt noted to land with L LE in slight ankle inversion  positioning on initial contact and through beginning of stance phase with L knee snapping back into hyperextension during mid to terminal stance - facilitated for increased L hip abductor activation and improved hip/pelvic alignment with improvement in positioning of distal joints. At end of session pt left supine in bed with needs in reach and bed alarm on.  Mobility Bed Mobility Bed Mobility: Supine to Sit;Sit to Supine Supine to Sit: Supervision/Verbal cueing Sit to Supine: Supervision/Verbal cueing Transfers Transfers: Sit to Stand;Stand to Sit;Stand Pivot Transfers Sit to Stand: Minimal Assistance - Patient > 75% Stand to Sit: Minimal Assistance - Patient > 75% Stand Pivot Transfers: Minimal Assistance - Patient > 75% Stand Pivot Transfer Details: Verbal cues for technique;Verbal cues for sequencing;Tactile cues for sequencing;Tactile cues for initiation;Tactile cues for weight beaing;Manual facilitation for weight shifting Transfer (Assistive device): None Locomotion  Gait Ambulation: Yes Gait Assistance: Minimal Assistance - Patient > 75% Gait Distance (Feet): 223 Feet Assistive device: None Gait Assistance Details: Tactile cues for initiation;Tactile cues for sequencing;Tactile cues for weight shifting;Manual facilitation for weight shifting;Manual facilitation for weight bearing;Verbal cues for gait pattern;Verbal cues for sequencing;Verbal cues for technique;Tactile cues for weight beaing;Tactile cues for placement;Tactile cues for posture;Manual facilitation for placement Gait Gait: Yes Gait Pattern: Impaired Gait Pattern: Step-through pattern;Decreased step length - left;Decreased stance time - left;Poor foot clearance - left;Lateral trunk lean to left;Left genu recurvatum;Decreased hip/knee flexion - left  (L ankle inversion (but not rolling) during stance with L knee snapping back into hyperextension, decreased L hip/knee flexion during swing) Gait velocity: decreased Stairs / Additional Locomotion Stairs: Yes Stairs Assistance: Minimal Assistance - Patient > 75%;Moderate Assistance - Patient 50 - 74% Stair Management Technique: Two rails;Step to pattern;Alternating pattern (alternating on ascent with cuing to place whole foot up onto the step and then step-to pattern leading with L LE on descent) Number of Stairs: 12 Height of Stairs: 6 Ramp: Minimal Assistance - Patient >75% Curb: Moderate Assistance - Patient 50 - 74% Wheelchair Mobility Wheelchair Mobility: No   Discharge Criteria: Patient will be discharged from PT if patient refuses treatment 3 consecutive times without medical reason, if treatment goals not met, if there is a change in medical status, if patient makes no progress towards goals or if patient is discharged from hospital.  The above assessment, treatment plan, treatment alternatives and goals were discussed and mutually agreed upon: by patient  Tawana Scale , PT, DPT, NCS, CSRS 01/13/2021, 7:48 AM

## 2021-01-13 NOTE — Evaluation (Signed)
Speech Language Pathology Assessment and Plan  Patient Details  Name: Christopher [name] MRN: [id number] Date of Birth: [birth year]  Today's Date: 01/13/2021 SLP Individual Time: 1400-1500 SLP Individual Time Calculation (min): 60 min  Hospital Problem: Principal Problem:   Acute CVA (cerebrovascular accident) [facility])  Past Medical History:  Past Medical History:  Diagnosis Date   PUD (peptic ulcer disease) 2008   treated with meds   Past Surgical History: History reviewed. No pertinent surgical history.  Assessment / Plan / Recommendation  HPI: Christopher [name] is a 55 year old LH-male with history of tobacco use and cocaine use otherwise in good health who was admitted on 01/10/2021 with one day history of slurred speech and left hemiparesis.  History taken from chart review and patient. 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 MRI brain done revealing 11 mm acute right thalamocapsular junction and 5 mm probable subacute infarct within the subcortical white matter in right frontal lobe.  Echocardiogram with ejection fraction of 55-60% and mild LVH. UDS positive for cocaine and THC. Dr. Roda Shutters felt that stroke likely due to cocaine use and recommended DAPT x3 weeks followed by ASA alone. Patient has been educated on importance of cessation/lifestyle changes. Therapy evaluations completed revealing mild dysarthria with unsteady gait and balance deficits. CIR recommended due to functional decline.  Please see preadmission assessment from earlier today as well.  Clinical Impression [facility] Mental Status (SLUMS) was administered. Pt scored 26/30 indicating cognitive-linguistic skills within functional limits (score of 25+ considered normal based on patient's educational hx of 9th grade edu.) Patient scored 30/30 on the Mini-Mental State Examination in prior setting on 01/11/21. 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 Pt utilized calendars, lists, and other visual reminders to support memory at baseline in which he described as occasional forgetfulness. He demonstrated awareness of current deficits and limitations and how these may impacts his current safety and independence. Pt endorsed slight changes with speech s/p CVA, however feels that this has resolved and his current speech is at baseline. Oral mechanism revealed mild left orofacial and lingual weakness however this did not appear to negatively impact motor speech production or speech intelligibility which was perceived as 100% intelligible at conversation level with ability to verbalize functional wants/needs/thoughts without need for modification or verbal repetition. Reviewed speech intelligibility strategies including increasing vocal loudness, reducing rate of speech, and over articulation in which patient both verbalized and returned demonstration with independence. Auditory comprehension and verbal expression appeared grossly intact. Patient reports continued tolerance of regular diet and thin liquids without difficulty. Skilled ST intervention does not appear warranted at this time due to patient likely being at baseline function from a cognitive-linguistic and speech perspective.    Skilled Therapeutic Interventions          Pt participated in [place] Mental Status Examination (SLUMS) as well as further non-standardized assessments of cognitive-linguistic, speech, and language function. Please see above.    SLP Assessment  Patient does not need any further Speech Lanaguage Pathology Services    Recommendations SLP Diet Recommendations: Age appropriate regular solids;Thin Liquid Administration via: Straw;Cup Medication Administration: Whole meds with liquid Supervision: Patient able to self feed Oral Care Recommendations: Oral care BID Patient destination: Home Follow up  Recommendations: None Equipment Recommended: None recommended by SLP  Pain Pain Assessment Pain Scale: 0-10 Pain Score: 0-No pain  Prior Functioning Cognitive/Linguistic Baseline: Information not available Type of Home: House  Lives With: Friend(s) Available Help at Discharge: Available PRN/intermittently Education: 9th grade Vocation: Full time employment  SLP Evaluation Cognition Overall Cognitive Status: Within Functional Limits for tasks assessed Arousal/Alertness: Awake/alert Orientation Level: Oriented X4 Year: 2022 Month: September Day of Week: Correct Attention: Focused;Sustained Focused Attention: Appears intact Sustained Attention: Appears intact Memory: Appears intact Safety/Judgment: Appears intact  Comprehension Auditory Comprehension Overall Auditory Comprehension: Appears within functional limits for tasks assessed Expression Expression Primary Mode of Expression: Verbal Verbal Expression Overall Verbal Expression: Appears within functional limits for tasks assessed Oral Motor Oral Motor/Sensory Function Overall Oral Motor/Sensory Function: Mild impairment Facial ROM: Reduced left Facial Symmetry: Abnormal symmetry left Facial Strength: Reduced left Facial Sensation: Within Functional Limits Lingual ROM: Within Functional Limits Lingual Symmetry: Within Functional Limits Lingual Strength: Reduced Lingual Sensation: Within Functional Limits Velum: Within Functional Limits Mandible: Within Functional Limits Motor Speech Overall Motor Speech: Appears within functional limits for tasks assessed Respiration: Within functional limits Phonation: Normal Resonance: Within functional limits Articulation: Within functional limitis Intelligibility: Intelligible Word: 75-100% accurate Phrase: 75-100% accurate Sentence: 75-100% accurate Conversation: 75-100% accurate Motor Planning: Witnin functional limits Motor Speech Errors: Not  applicable  Care Tool Care Tool Cognition Ability to hear (with hearing aid or hearing appliances if normally used Ability to hear (with hearing aid or hearing appliances if normally used): 0. Adequate - no difficulty in normal conservation, social interaction, listening to TV   Expression of Ideas and Wants Expression of Ideas and Wants: 3. Some difficulty - exhibits some difficulty with expressing needs and ideas (e.g, some words or finishing thoughts) or speech is not clear   Understanding Verbal and Non-Verbal Content Understanding Verbal and Non-Verbal Content: 4. Understands (complex and basic) - clear comprehension without cues or repetitions  Memory/Recall Ability Memory/Recall Ability : That he or she is in a hospital/hospital unit;Current season   Intelligibility: Intelligible Word: 75-100% accurate Phrase: 75-100% accurate Sentence: 75-100% accurate Conversation: 75-100% accurate  Recommendations for other services: None   The above assessment, treatment plan, treatment alternatives and goals were discussed and mutually agreed upon: by patient  Tamala Ser 01/13/2021, 3:39 PM

## 2021-01-13 NOTE — Plan of Care (Signed)
  Problem: Sit to Stand Goal: LTG:  Patient will perform sit to stand with assistance level (PT) Description: LTG:  Patient will perform sit to stand with assistance level (PT) Flowsheets (Taken 01/13/2021 2053) LTG: PT will perform sit to stand in preparation for functional mobility with assistance level: Supervision/Verbal cueing   Problem: RH Balance Goal: LTG Patient will maintain dynamic sitting balance (PT) Description: LTG:  Patient will maintain dynamic sitting balance with assistance during mobility activities (PT) Flowsheets (Taken 01/13/2021 2053) LTG: Pt will maintain dynamic sitting balance during mobility activities with:: Independent with assistive device  Goal: LTG Patient will maintain dynamic standing balance (PT) Description: LTG:  Patient will maintain dynamic standing balance with assistance during mobility activities (PT) Flowsheets (Taken 01/13/2021 2053) LTG: Pt will maintain dynamic standing balance during mobility activities with:: Supervision/Verbal cueing   Problem: Sit to Stand Goal: LTG:  Patient will perform sit to stand with assistance level (PT) Description: LTG:  Patient will perform sit to stand with assistance level (PT) Flowsheets (Taken 01/13/2021 2053) LTG: PT will perform sit to stand in preparation for functional mobility with assistance level: Supervision/Verbal cueing   Problem: RH Bed Mobility Goal: LTG Patient will perform bed mobility with assist (PT) Description: LTG: Patient will perform bed mobility with assistance, with/without cues (PT). Flowsheets (Taken 01/13/2021 2053) LTG: Pt will perform bed mobility with assistance level of: Independent with assistive device    Problem: RH Bed to Chair Transfers Goal: LTG Patient will perform bed/chair transfers w/assist (PT) Description: LTG: Patient will perform bed to chair transfers with assistance (PT). Flowsheets (Taken 01/13/2021 2053) LTG: Pt will perform Bed to Chair Transfers with assistance  level: Supervision/Verbal cueing   Problem: RH Car Transfers Goal: LTG Patient will perform car transfers with assist (PT) Description: LTG: Patient will perform car transfers with assistance (PT). Flowsheets (Taken 01/13/2021 2053) LTG: Pt will perform car transfers with assist:: Supervision/Verbal cueing   Problem: RH Ambulation Goal: LTG Patient will ambulate in controlled environment (PT) Description: LTG: Patient will ambulate in a controlled environment, # of feet with assistance (PT). Flowsheets (Taken 01/13/2021 2053) LTG: Pt will ambulate in controlled environ  assist needed:: Supervision/Verbal cueing LTG: Ambulation distance in controlled environment: 163ft using LRAD Goal: LTG Patient will ambulate in home environment (PT) Description: LTG: Patient will ambulate in home environment, # of feet with assistance (PT). Flowsheets (Taken 01/13/2021 2053) LTG: Pt will ambulate in home environ  assist needed:: Supervision/Verbal cueing LTG: Ambulation distance in home environment: 70ft using LRAD   Problem: RH Stairs Goal: LTG Patient will ambulate up and down stairs w/assist (PT) Description: LTG: Patient will ambulate up and down # of stairs with assistance (PT) Flowsheets (Taken 01/13/2021 2053) LTG: Pt will ambulate up/down stairs assist needed:: Supervision/Verbal cueing LTG: Pt will  ambulate up and down number of stairs: 4 steps using B HRs

## 2021-01-14 DIAGNOSIS — I639 Cerebral infarction, unspecified: Secondary | ICD-10-CM | POA: Diagnosis not present

## 2021-01-14 LAB — CBC WITH DIFFERENTIAL/PLATELET
Abs Immature Granulocytes: 0.03 10*3/uL (ref 0.00–0.07)
Basophils Absolute: 0.1 10*3/uL (ref 0.0–0.1)
Basophils Relative: 1 %
Eosinophils Absolute: 0.2 10*3/uL (ref 0.0–0.5)
Eosinophils Relative: 3 %
HCT: 47.5 % (ref 39.0–52.0)
Hemoglobin: 15.7 g/dL (ref 13.0–17.0)
Immature Granulocytes: 0 %
Lymphocytes Relative: 22 %
Lymphs Abs: 2.1 10*3/uL (ref 0.7–4.0)
MCH: 29.4 pg (ref 26.0–34.0)
MCHC: 33.1 g/dL (ref 30.0–36.0)
MCV: 89 fL (ref 80.0–100.0)
Monocytes Absolute: 0.8 10*3/uL (ref 0.1–1.0)
Monocytes Relative: 9 %
Neutro Abs: 6.1 10*3/uL (ref 1.7–7.7)
Neutrophils Relative %: 65 %
Platelets: 272 10*3/uL (ref 150–400)
RBC: 5.34 MIL/uL (ref 4.22–5.81)
RDW: 11.9 % (ref 11.5–15.5)
WBC: 9.3 10*3/uL (ref 4.0–10.5)
nRBC: 0 % (ref 0.0–0.2)

## 2021-01-14 NOTE — Progress Notes (Signed)
Physical Therapy Session Note  Patient Details  Name: Christopher [name] MRN: [id number] Date of Birth: 07-26-1965  Today's Date: 01/14/2021 PT Individual Time: 1300-1355 PT Individual Time Calculation (min): 55 min   Short Term Goals: Week 1:  PT Short Term Goal 1 (Week 1): Pt will perform sit<>stands using LRAD with CGA PT Short Term Goal 2 (Week 1): Pt will perform bed<>chair transfers using LRAD with CGA PT Short Term Goal 3 (Week 1): Pt will ambulate at least 171ft using LRAD with CGA PT Short Term Goal 4 (Week 1): Pt will ascend/descend 4 steps using HRs with CGA  Skilled Therapeutic Interventions/Progress Updates:     Pt supine in bed at start of session. He believed he was done with therapy for the day so needed a little bit of convincing to participate but otherwise was pleasant and cooperative throughout session. NT present for routine vitals. Pt completed bed mobility at supervision level. Donned tennis shoes with minA and required totalA for tieing the laces. (L shoe with eversion wedge to support forefoot from inversion and assist with knee hyperextension). Sit<>stand with CGA and no AD, minA for balance. Ambulates from his room downstairs to main rehab gym (standing rest break in elevator) with minA and no AD, >545ft. Gait deficits include increased lateral sway, narrow BOS, occasional L knee hyperextension in stance, occasional L toe drag (more with fatigue). Cues for improving gait deficits and normalizing gait pattern. Seated rest break in rehab gym and then worked on dynamic standing balance and strengthening with unsupported alternating toe taps to 3inch platform and minA for balance, progressing to alternating step-ups to 6inch step with 2 hand rails and minA for balance. Next, worked on dynamic gait and completed DGI (see details below) where she scored 10/24 indicating increased risk of falls. Scores >22/24 indicate safe ambulator while scores <19/24 predictive of increased falls.  Pt ambulated back upstairs to his room in similar manner as above, minA overall with no AD, >568ft. Able to doff tennis shoes without assist at EOB and completes bed mobility with supervision. Able to scoot himself up in bed with verbal cues only and remained supine with all needs in reach and bed alarm on at conclusion of session.  Therapy Documentation Precautions:  Precautions Precautions: Fall Precaution Comments: L hemiparesis (monitor L ankle position) Restrictions Weight Bearing Restrictions: No General:   Balance Balance Assessed: Yes Standardized Balance Assessment Standardized Balance Assessment: Dynamic Gait Index Dynamic Gait Index Level Surface: Moderate Impairment Change in Gait Speed: Mild Impairment Gait with Horizontal Head Turns: Moderate Impairment Gait with Vertical Head Turns: Mild Impairment Gait and Pivot Turn: Moderate Impairment Step Over Obstacle: Moderate Impairment Step Around Obstacles: Moderate Impairment Steps: Moderate Impairment Total Score: 10/24  Therapy/Group: Individual Therapy  Orrin Brigham 01/14/2021, 12:53 PM

## 2021-01-14 NOTE — Progress Notes (Signed)
PROGRESS NOTE   Subjective/Complaints: Sleepy this morning, no complaints Leukocytosis resolved Patient's chart reviewed- No issues reported overnight Vitals signs stable except for elevated SBP   ROS: denies pain, insomnia   Objective:   No results found. Recent Labs    01/13/21 0551 01/14/21 0514  WBC 12.2* 9.3  HGB 15.7 15.7  HCT 45.4 47.5  PLT 251 272   Recent Labs    01/12/21 0206 01/13/21 0551  NA 134* 134*  K 4.0 4.3  CL 103 103  CO2 25 24  GLUCOSE 104* 107*  BUN 14 21*  CREATININE 1.10 1.13  CALCIUM 9.2 9.3    Intake/Output Summary (Last 24 hours) at 01/14/2021 1332 Last data filed at 01/14/2021 0720 Gross per 24 hour  Intake 560 ml  Output 725 ml  Net -165 ml        Physical Exam: Vital Signs Blood pressure (!) 146/85, pulse 78, temperature 98.4 F (36.9 C), temperature source Oral, resp. rate 18, height 5\' 9"  (1.753 m), weight 52.6 kg, SpO2 96 %. Gen: no distress, normal appearing, BMI 17.12, sleepy HEENT: oral mucosa pink and moist, NCAT Cardio: Reg rate Chest: normal effort, normal rate of breathing Abdominal:     General: There is distension.     Comments: Hypoactive bowel sounds  Musculoskeletal:     Cervical back: Normal range of motion and neck supple.     Comments: No edema or tenderness in extremities  Skin:    General: Skin is warm and dry.     Comments: Irritation along beard hairline  Neurological:     Mental Status: He is alert.     Comments: Alert and oriented to person and place Motor: RUE/RLE: 5/5 proximal distally in LUE/LE: 4/5 proximal distal Dysarthria  Psychiatric:     Comments: Slightly delayed and slowed    Assessment/Plan: 1. Functional deficits which require 3+ hours per day of interdisciplinary therapy in a comprehensive inpatient rehab setting. Physiatrist is providing close team supervision and 24 hour management of active medical problems listed  below. Physiatrist and rehab team continue to assess barriers to discharge/monitor patient progress toward functional and medical goals  Care Tool:  Bathing    Body parts bathed by patient: Left arm, Chest, Abdomen, Front perineal area, Buttocks, Right upper leg, Left upper leg, Face, Left lower leg, Right lower leg         Bathing assist Assist Level: Minimal Assistance - Patient > 75%     Upper Body Dressing/Undressing Upper body dressing   What is the patient wearing?: Pull over shirt    Upper body assist Assist Level: Minimal Assistance - Patient > 75%    Lower Body Dressing/Undressing Lower body dressing      What is the patient wearing?: Pants     Lower body assist Assist for lower body dressing: Minimal Assistance - Patient > 75%     Toileting Toileting    Toileting assist Assist for toileting: Minimal Assistance - Patient > 75% Assistive Device Comment: urinal   Transfers Chair/bed transfer  Transfers assist     Chair/bed transfer assist level: Minimal Assistance - Patient > 75%     Locomotion Ambulation  Ambulation assist      Assist level: Minimal Assistance - Patient > 75% Assistive device: No Device Max distance: 250'   Walk 10 feet activity   Assist     Assist level: Minimal Assistance - Patient > 75% Assistive device: No Device   Walk 50 feet activity   Assist    Assist level: Minimal Assistance - Patient > 75% Assistive device: No Device    Walk 150 feet activity   Assist    Assist level: Minimal Assistance - Patient > 75% Assistive device: No Device    Walk 10 feet on uneven surface  activity   Assist     Assist level: Minimal Assistance - Patient > 75% Assistive device: Other (comment) (none)   Wheelchair     Assist Is the patient using a wheelchair?: No             Wheelchair 50 feet with 2 turns activity    Assist            Wheelchair 150 feet activity     Assist           Blood pressure (!) 146/85, pulse 78, temperature 98.4 F (36.9 C), temperature source Oral, resp. rate 18, height 5\' 9"  (1.753 m), weight 52.6 kg, SpO2 96 %.  Medical Problem List and Plan: 1.  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             -patient may shower             -ELOS/Goals: 13-16 days/Supervision/min a            Continue CIR 2.  Impaired mobility: -DVT/anticoagulation:  Pharmaceutical: continue Lovenox             -antiplatelet therapy: DAPT X 3 weeks followed by ASA alone.  3. Pain Management: N/A 4. Mood: LCSW to follow for evaluation and support.              -antipsychotic agents: N/A 5. Neuropsych: This patient is not capable of making decisions on his own behalf. 6. Skin/Wound Care: Routine pressure relief measures.  7. Fluids/Electrolytes/Nutrition: Monitor I/Os 8. HTN: Monitor BP TID w/permissive HTN for 5-7 days to  normalize OK for <220/120 (avoid hypoperfusion)              Monitor his increase mobility 9. Hyperlipidemia: LDL at [facility] but HDL<265. Now on low dose statin- Continue Lipitor 20 mg/day 10. Polysubstance abuse:              Counseled on appropriate 11. Leucocytosis: Resolved 12. Hyponatremia:             stable, monitor weekly 13. Mild anorexia BMI 17.12- provide dietary education.   14. Elevated BUN: placed nursing order to encourage 6-8 glasses of water per day  LOS: 2 days A FACE TO FACE EVALUATION WAS PERFORMED  Nashaly Dorantes P Miken Stecher 01/14/2021, 1:32 PM

## 2021-01-14 NOTE — Progress Notes (Addendum)
Physical Therapy Session Note  Patient Details  Name: Christopher [name] MRN: [id number] Date of Birth: 1965-06-22  Today's Date: 01/14/2021 PT Individual Time: 4818-5631 PT Individual Time Calculation (min): 60 min   Short Term Goals: Week 1:  PT Short Term Goal 1 (Week 1): Pt will perform sit<>stands using LRAD with CGA PT Short Term Goal 2 (Week 1): Pt will perform bed<>chair transfers using LRAD with CGA PT Short Term Goal 3 (Week 1): Pt will ambulate at least 183ft using LRAD with CGA PT Short Term Goal 4 (Week 1): Pt will ascend/descend 4 steps using HRs with CGA  Skilled Therapeutic Interventions/Progress Updates:    PAIN pt states he woke w/L wrist pain, cannot describe or apply number to.  See below for LUE NMRE.  Pt supine to sit w/close supervision, cues, use of bedrail, additional time.   stand pivot transfer to wc w/min assist for balance. Pt transported to gym. Gait 69ft w/min assist, L ankle inversion tendency, L knee extension thrust at loading, inconsitent clearance and tendency to adduct to midline. Therapist placed heel wedge w/eversion posting in shoe then pt performed gait x 272ft w/min assist, improved control at ankle, vatiable knee extension thrust but pt able to recognize and at times controls via maintaining approx 20degrees knee flexion thru stance vs maintaining full extension from initial contact. Pt w improved control middistance thru gait and decreased control w/fatigue. Cues to control contact w/heel and away from midline LLE.    During break pt worked on using L hand w/mechanical grip box for LUE NM control.  Worked on holding tools by grasping w/L hand, turning various knobs w/L hand. Iniitally max assist/hand over hand but progressive increase in LUE activation w/repetition.   Pt w/much improved functional use when cued to attend to task and "talk to hand" while performing, very engaged in this mechanical activity.    Repeated gait x 75ft as above.  At end of  session, pt transported to room.  Pt left oob in wc w/alarm belt set and needs in reach  Therapy Documentation Precautions:  Precautions Precautions: Fall Precaution Comments: L hemiparesis (monitor L ankle position) Restrictions Weight Bearing Restrictions: No     Therapy/Group: Individual Therapy Rada Hay, PT   Shearon Balo 01/14/2021, 4:45 PM

## 2021-01-14 NOTE — Progress Notes (Signed)
Occupational Therapy Session Note  Patient Details  Name: Christopher [name] MRN: [id number] Date of Birth: 28-Sep-1965  Today's Date: 01/14/2021 OT Individual Time: 0623-7628 OT Individual Time Calculation (min): 53 min    Short Term Goals: Week 1:  OT Short Term Goal 1 (Week 1): Pt will complete LB selfcare sit to stand with min guard assist. OT Short Term Goal 2 (Week 1): Pt will complete toilet transfers with min guard using LRAD. OT Short Term Goal 3 (Week 1): Pt will use the LUE at a diminished level to donn clothing with supervision overall when threading pants, socks, shirt.  Skilled Therapeutic Interventions/Progress Updates:    Pt in recliner to start session with requests to use the bathroom.  He was able to ambulate from the recliner to the bathroom and complete toileting in standing with min assist and no device.  He then transitioned over to the sink where he washed his hands with min assist before ambulating down to the therapy gym.  Worked on LUE strengthening to start with use of a therapy ball and having him complete bilateral shoulder flexion, keeping both arms even with elbows extended.  He was able to complete several repetitions with min assist to min guard.  Transitioned to Box and Blocks test where he completed 33 blocks with the RUE and 6 blocks with the LUE in 1 minute time.  Had him continue working on picking up the blocks and placing them with the LUE without being timed.  He needed multiple rest breaks after completion of 3-4 blocks.  Completed several intervals with therapist issuing him a few blocks to practice with in his room.  Finished session with ambulation back to the room at min assist level and transfer to the bed with supervision.  Call button and phone in reach with safety alarm in place.    Therapy Documentation Precautions:  Precautions Precautions: Fall Precaution Comments: L hemiparesis (monitor L ankle position) Restrictions Weight Bearing  Restrictions: No  Pain: Pain Assessment Pain Scale: Faces Pain Score: 0-No pain ADL: See Care Tool Section for some details of mobility and selfcare   Therapy/Group: Individual Therapy  Azaela Caracci OTR/L 01/14/2021, 12:59 PM

## 2021-01-15 DIAGNOSIS — M25561 Pain in right knee: Secondary | ICD-10-CM | POA: Diagnosis not present

## 2021-01-15 DIAGNOSIS — I10 Essential (primary) hypertension: Secondary | ICD-10-CM

## 2021-01-15 DIAGNOSIS — I639 Cerebral infarction, unspecified: Secondary | ICD-10-CM | POA: Diagnosis not present

## 2021-01-15 DIAGNOSIS — E871 Hypo-osmolality and hyponatremia: Secondary | ICD-10-CM

## 2021-01-15 NOTE — IPOC Note (Signed)
Overall Plan of Care [facility]) Patient Details Name: Christopher [name] MRN: [id number] DOB: 11-16-1965  Admitting Diagnosis: Acute CVA (cerebrovascular accident) [facility])  Hospital Problems: Principal Problem:   Acute CVA (cerebrovascular accident) [facility])     Functional Problem List: Nursing Medication Management, Safety, Endurance  PT Balance, Perception, Behavior, Safety, Edema, Sensory, Endurance, Skin Integrity, Motor, Nutrition, Pain  OT Balance, Motor, Sensory  SLP    TR         Basic ADL's: OT Eating, Grooming, Bathing, Dressing, Toileting     Advanced  ADL's: OT Simple Meal Preparation     Transfers: PT Bed Mobility, Bed to Chair, Car, Furniture, Floor  OT Toilet, Tub/Shower     Locomotion: PT Ambulation, Stairs     Additional Impairments: OT Fuctional Use of Upper Extremity  SLP        TR      Anticipated Outcomes Item Anticipated Outcome  Self Feeding modified independent  Swallowing      Basic self-care  supervision to modified independent  Toileting  supervision   Bathroom Transfers supervision  Bowel/Bladder  n/a  Transfers  supervision using LRAD  Locomotion  supervision using LRAD  Communication     Cognition     Pain  n/a  Safety/Judgment  w cues/reminders   Therapy Plan: PT Intensity: Minimum of 1-2 x/day ,45 to 90 minutes PT Frequency: 5 out of 7 days PT Duration Estimated Length of Stay: ~1.5-2 weeks OT Intensity: Minimum of 1-2 x/day, 45 to 90 minutes OT Frequency: 5 out of 7 days OT Duration/Estimated Length of Stay: 10-12 days     Due to the current state of emergency, patients may not be receiving their 3-hours of Medicare-mandated therapy.   Team Interventions: Nursing Interventions Disease Management/Prevention, Medication Management, Discharge Planning, Patient/Family Education  PT interventions Ambulation/gait training, Community reintegration, DME/adaptive equipment instruction, Neuromuscular re-education, Psychosocial  support, Stair training, UE/LE Strength taining/ROM, Warden/ranger, Discharge planning, Functional electrical stimulation, Pain management, Skin care/wound management, Therapeutic Activities, UE/LE Coordination activities, Cognitive remediation/compensation, Disease management/prevention, Functional mobility training, Patient/family education, Splinting/orthotics, Therapeutic Exercise, Visual/perceptual remediation/compensation  OT Interventions Balance/vestibular training, Functional electrical stimulation, Pain management, Self Care/advanced ADL retraining, Therapeutic Activities, UE/LE Coordination activities, Cognitive remediation/compensation, Functional mobility training, Patient/family education, Therapeutic Exercise, Community reintegration, Disease mangement/prevention, DME/adaptive equipment instruction, Neuromuscular re-education, UE/LE Strength taining/ROM, Psychosocial support, Visual/perceptual remediation/compensation  SLP Interventions    TR Interventions    SW/CM Interventions Discharge Planning, Psychosocial Support, Patient/Family Education, Disease Management/Prevention   Barriers to Discharge MD  Medical stability  Nursing Decreased caregiver support, Home environment access/layout 1 level 2ste garage apt with friend  PT Decreased caregiver support    OT Inaccessible home environment Lived in a garage  SLP      SW Insurance for SNF coverage, Decreased caregiver support, Lack of/limited family support     Team Discharge Planning: Destination: PT-Home ,OT- Home , SLP-Home Projected Follow-up: PT-Outpatient PT, 24 hour supervision/assistance, OT-  24 hour supervision/assistance, Outpatient OT, SLP-None Projected Equipment Needs: PT-To be determined, OT- To be determined, SLP-None recommended by SLP Equipment Details: PT- , OT-  Patient/family involved in discharge planning: PT- Patient,  OT-Patient, SLP-Patient  MD ELOS: 10-12 days Medical Rehab Prognosis:   Excellent Assessment: The patient has been admitted for CIR therapies with the diagnosis of CVA. The team will be addressing functional mobility, strength, stamina, balance, safety, adaptive techniques and equipment, self-care, bowel and bladder mgt, patient and caregiver education, NMR, pain mgt community reentry. Goals have been set at  mod I to supervision.   Due to the current state of emergency, patients may not be receiving their 3 hours per day of Medicare-mandated therapy.    Ranelle Oyster, MD, FAAPMR     See Team Conference Notes for weekly updates to the plan of care

## 2021-01-15 NOTE — Progress Notes (Signed)
PROGRESS NOTE   Subjective/Complaints: Generally doing well but developed right knee pain last night. Received some tylenol which helped to a degree  ROS: Patient denies fever, rash, sore throat, blurred vision, nausea, vomiting, diarrhea, cough, shortness of breath or chest pain, headache, or mood change.    Objective:   No results found. Recent Labs    01/13/21 0551 01/14/21 0514  WBC 12.2* 9.3  HGB 15.7 15.7  HCT 45.4 47.5  PLT 251 272   Recent Labs    01/13/21 0551  NA 134*  K 4.3  CL 103  CO2 24  GLUCOSE 107*  BUN 21*  CREATININE 1.13  CALCIUM 9.3    Intake/Output Summary (Last 24 hours) at 01/15/2021 1135 Last data filed at 01/15/2021 0734 Gross per 24 hour  Intake 478 ml  Output 750 ml  Net -272 ml        Physical Exam: Vital Signs Blood pressure 132/77, pulse 63, temperature 98.7 F (37.1 C), temperature source Oral, resp. rate 18, height 5\' 9"  (1.753 m), weight 52.6 kg, SpO2 96 %. Constitutional: No distress . Vital signs reviewed. HEENT: NCAT, EOMI, oral membranes moist Neck: supple Cardiovascular: RRR without murmur. No JVD    Respiratory/Chest: CTA Bilaterally without wheezes or rales. Normal effort    GI/Abdomen: BS +, non-tender, non-distended Ext: no clubbing, cyanosis, or edema Psych: pleasant and cooperative, a little flat Musculoskeletal:     Cervical back: Normal range of motion and neck supple.     right knee tender with AROM/PROM. No instability, no effusion or warmth, large callus below patella Skin:    General: Skin is warm and dry.     Comments: Irritation along beard hairline  Neurological:     Mental Status: He is alert.     Comments: Alert and oriented to person and place Motor: RUE/RLE: 5/5 proximal distally in LUE/LE: 4/5 proximal distal Dysarthria       Assessment/Plan: 1. Functional deficits which require 3+ hours per day of interdisciplinary therapy in a  comprehensive inpatient rehab setting. Physiatrist is providing close team supervision and 24 hour management of active medical problems listed below. Physiatrist and rehab team continue to assess barriers to discharge/monitor patient progress toward functional and medical goals  Care Tool:  Bathing    Body parts bathed by patient: Left arm, Chest, Abdomen, Front perineal area, Buttocks, Right upper leg, Left upper leg, Face, Left lower leg, Right lower leg         Bathing assist Assist Level: Minimal Assistance - Patient > 75%     Upper Body Dressing/Undressing Upper body dressing   What is the patient wearing?: Pull over shirt    Upper body assist Assist Level: Minimal Assistance - Patient > 75%    Lower Body Dressing/Undressing Lower body dressing      What is the patient wearing?: Pants     Lower body assist Assist for lower body dressing: Minimal Assistance - Patient > 75%     Toileting Toileting    Toileting assist Assist for toileting: Minimal Assistance - Patient > 75% Assistive Device Comment: urinal   Transfers Chair/bed transfer  Transfers assist     Chair/bed  transfer assist level: Minimal Assistance - Patient > 75%     Locomotion Ambulation   Ambulation assist      Assist level: Minimal Assistance - Patient > 75% Assistive device: No Device Max distance: 250'   Walk 10 feet activity   Assist     Assist level: Minimal Assistance - Patient > 75% Assistive device: No Device   Walk 50 feet activity   Assist    Assist level: Minimal Assistance - Patient > 75% Assistive device: No Device    Walk 150 feet activity   Assist    Assist level: Minimal Assistance - Patient > 75% Assistive device: No Device    Walk 10 feet on uneven surface  activity   Assist     Assist level: Minimal Assistance - Patient > 75% Assistive device: Other (comment) (none)   Wheelchair     Assist Is the patient using a wheelchair?: No              Wheelchair 50 feet with 2 turns activity    Assist            Wheelchair 150 feet activity     Assist          Blood pressure 132/77, pulse 63, temperature 98.7 F (37.1 C), temperature source Oral, resp. rate 18, height 5\' 9"  (1.753 m), weight 52.6 kg, SpO2 96 %.  Medical Problem List and Plan: 1.  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             -patient may shower             -ELOS/Goals: 13-16 days/Supervision/min a           -Continue CIR therapies including PT, OT, and SLP  2.  Impaired mobility: -DVT/anticoagulation:  Pharmaceutical: continue Lovenox             -antiplatelet therapy: DAPT X 3 weeks followed by ASA alone.  3. Pain Management: mild right knee pain, likely mild OA. Obviously works a lot with knees on the ground.  -continue tylenol  -ice prn, observe 4. Mood: LCSW to follow for evaluation and support.              -antipsychotic agents: N/A 5. Neuropsych: This patient is not capable of making decisions on his own behalf. 6. Skin/Wound Care: Routine pressure relief measures.  7. Fluids/Electrolytes/Nutrition: Monitor I/Os 8. HTN: Monitor BP TID w/permissive HTN for 5-7 days to  normalize OK for <220/120 (avoid hypoperfusion)              9/10 controlled at present 9. Hyperlipidemia: LDL at [facility] but HDL<265. Now on low dose statin- Continue Lipitor 20 mg/day 10. Polysubstance abuse:              Counseled on appropriate 11. Leucocytosis: Resolved 12. Hyponatremia:             stable, monitor weekly 13. Mild anorexia BMI 17.12- provide dietary education.   14. Elevated BUN: placed nursing order to encourage 6-8 glasses of water per day  -f/u labs Monday  LOS: 3 days A FACE TO FACE EVALUATION WAS PERFORMED  Tuesday 01/15/2021, 11:35 AM

## 2021-01-15 NOTE — Progress Notes (Signed)
Occupational Therapy Session Note  Patient Details  Name: Christopher [name] MRN: [id number] Date of Birth: 12/25/1965  Today's Date: 01/15/2021 OT Individual Time: 9798-9211 OT Individual Time Calculation (min): 40 min  and Today's Date: 01/15/2021 OT Missed Time: 20 Minutes Missed Time Reason: Patient fatigue   Short Term Goals: Week 1:  OT Short Term Goal 1 (Week 1): Pt will complete LB selfcare sit to stand with min guard assist. OT Short Term Goal 2 (Week 1): Pt will complete toilet transfers with min guard using LRAD. OT Short Term Goal 3 (Week 1): Pt will use the LUE at a diminished level to donn clothing with supervision overall when threading pants, socks, shirt.  Skilled Therapeutic Interventions/Progress Updates:     Pt received in bed with unrated knee pain. Shower provided and pt reporting heat REALLY helping with pain. Edu re on use of heat packs for knee pain in future.   ADL:  Pt completes bathing with superviison seated on shower chair with VC for L attention to LUE and exaggeration of movements in hand to improve grasp/release of washcloth Pt completes UB dressing with set up at EOB with no VC for hemi strategy Pt completes LB dressing with CGA at sit to stand level after VC for noticing/responding to clothing orientation Pt completes footwear with VC for putting all of L thumb into sock to assist with getting over toes Pt completes shower/Tub transfer with large L LOB 3x to/from shower d/t scissoring of Les with poor righting reactions requiring up to MOD A to prevent fall. Pt attempting to doff pants in standing prior to shower with L lena and requires direct VC for sitting onto BSC to doff pants over feet with edu re why unsafe in standing d/t balance deficits Green sponge provided at end of session for pt to use in bed to strengthen hand.  Pt left at end of session in bed with exit alarm on, call light in reach and all needs met. Pt missed 20 min skilled OT d/t  fatigue/requesting to rest.   Therapy/Group: Individual Therapy  Tonny Branch 01/15/2021, 6:56 AM

## 2021-01-15 NOTE — Progress Notes (Signed)
Physical Therapy Session Note  Patient Details  Name: Christopher [name] MRN: [id number] Date of Birth: 1965/07/28  Today's Date: 01/15/2021 PT Individual Time: 1011-1107 and 3016-0109 PT Individual Time Calculation (min): 56 min and 44 min  Short Term Goals: Week 1:  PT Short Term Goal 1 (Week 1): Pt will perform sit<>stands using LRAD with CGA PT Short Term Goal 2 (Week 1): Pt will perform bed<>chair transfers using LRAD with CGA PT Short Term Goal 3 (Week 1): Pt will ambulate at least 126ft using LRAD with CGA PT Short Term Goal 4 (Week 1): Pt will ascend/descend 4 steps using HRs with CGA  Skilled Therapeutic Interventions/Progress Updates:    Session 1: Pt received supine in bed and agreeable to therapy session. Reports R knee was "throbbing" and "aching" in pain last night and is currently at 1/10 with pt premedicated - states it is worsened by standing/walking/weightbearing. Supine>sitting L EOB, HOB flat and not using bedrail, supervision. Sitting EOB with supervision for trunk control due to 1x significant L anterior trunk LOB with pt able to recover with verbal cuing - while donning shoes total assist for time management. Pt still with eversion biased heel wedge in L shoe. Pt reports need to use bathroom. Sit<>stands throughout session with CGA and intermittent min assist while cuing/encouraging pt to push up with L UE from w/c armrest to promote NMR. Gait ~1ft in/out bathroom, no AD, with min assist for balance - pt demos significant improvement in L hip abductor activation during L stance with decreased L ankle inversion bias and no instances of L knee snapping back into hyperextension. Standing with CGA for safety, continently voided bladder then standing at sink washed hands with continued CGA. Pt requests w/c transport to/from therapy gym due to fear of increased R knee pain.  Transported to/from gym in w/c.    Gait training ~268ft, no AD, with light min assist for balance - manual  facilitation for improved trunk alignment as pt noted to have L lateral trunk flexion initially, cuing for increased L LE step length and foot clearance as pt has decreased L hip/knee flexion during swing with fatigue, cuing for increased R LE step length for gait symmetry and increased L LE stance time. Continues to demo slower gait speed overall but pt says this is baseline.  Dynamic gait training using agility ladder:  - forward reciprocal stepping with min assist for balance, cuing and manual facilitation for increased L stance time to allow full pelvic advancement to achieve terminal hip extension and allow increased R EL step length - side stepping with cuing/manual facilitation to maintain pelvis facing forward and not biasing turning L when stepping in that direction - with fatigue had a few instances of L knee snapping back into hyperextension therefore provided seated rest break - backwards stepping starting with step-to leading with L LE progressed to reciprocal pattern but pt unable to maintain feet inside of the square - continues to require min assist for balance  Pt reporting increased R knee ache therefore transitioned to L UE NMR task in sitting of donning/doffing 5/8ths nut on/off of a bolt to 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  Transported back up to room. Short distance ~44ft ambulatory transfer w/c>recliner, no AD, with light min assist.  Pt left seated in recliner with needs in reach and seat belt alarm on.    Session 2: Pt received supine in bed and agreeable to therapy session. Supine>sitting L EOB supervision. Sitting EOB donned tennis shoes total assist for time management. Pt reports his R knee feels much better after putting warm water on it in  the shower with OT. Sit<>stands, no AD, CGA for steadying during session - pt with good recall/carryover to push up with L UE during transfers for NMR.   Gait training ~270ft to/from main therapy gym, no AD, with CGA and intermittent min assist for balance due to scissoring gait causing mild L LOB - continues to demo slow gait speed and decreased anterior pelvic translation over L stance phase with tactile cuing for improvement and increased hip abductor/glute activation - continues to have significant improvement in L foot/ankle alignment with slanted heel wedge in shoe biasing eversion, which also is improving knee stability with only a few instances of knee snapping back towards hyperextension throughout session.   Performed the following dynamic standing balance and L UE NMR tasks: - seated bimanual task of large ball toss progressed to standing large ball toss into rebounder with combination move of performing sit<>stands between each repetition, CGA for steadying  - standing bimanual task of bouncing a large ball on mat table with intermittent L UE unilateral task of grasping and reaching to move smaller slightly deflated beach ball  - dynamic gait while bouncing large ball and having to retrieve it from ground with CGA/light min assist for balance - standing folding large towel with pt demonstrating significant improvement in L UE ability to grasp and manipulate towel and fold it neatly  At end of session pt left supine in bed, HOB elevated, with needs in reach, bed alarm on, and meal tray set-up.  Therapy Documentation Precautions:  Precautions Precautions: Fall Precaution Comments: L hemiparesis (monitor L ankle position) Restrictions Weight Bearing Restrictions: No   Pain:  Session 1: Details above about R knee pain - provided seated rest break for pain management - premedicated.  Session 2: Reports significant improvement in R knee pain after shower.    Therapy/Group: Individual  Therapy  Ginny Forth , PT, DPT, NCS, CSRS 01/15/2021, 8:02 AM

## 2021-01-17 DIAGNOSIS — I639 Cerebral infarction, unspecified: Secondary | ICD-10-CM | POA: Diagnosis not present

## 2021-01-17 LAB — CBC
HCT: 45.5 % (ref 39.0–52.0)
Hemoglobin: 14.9 g/dL (ref 13.0–17.0)
MCH: 29.6 pg (ref 26.0–34.0)
MCHC: 32.7 g/dL (ref 30.0–36.0)
MCV: 90.3 fL (ref 80.0–100.0)
Platelets: 287 10*3/uL (ref 150–400)
RBC: 5.04 MIL/uL (ref 4.22–5.81)
RDW: 11.8 % (ref 11.5–15.5)
WBC: 7.3 10*3/uL (ref 4.0–10.5)
nRBC: 0 % (ref 0.0–0.2)

## 2021-01-17 NOTE — Progress Notes (Signed)
Physical Therapy Session Note  Patient Details  Name: Christopher [name] MRN: [id number] Date of Birth: August 11, 1965  Today's Date: 01/17/2021 PT Individual Time: 0916-1009 PT Individual Time Calculation (min): 53 min   Short Term Goals: Week 1:  PT Short Term Goal 1 (Week 1): Pt will perform sit<>stands using LRAD with CGA PT Short Term Goal 2 (Week 1): Pt will perform bed<>chair transfers using LRAD with CGA PT Short Term Goal 3 (Week 1): Pt will ambulate at least 153ft using LRAD with CGA PT Short Term Goal 4 (Week 1): Pt will ascend/descend 4 steps using HRs with CGA  Skilled Therapeutic Interventions/Progress Updates:  Pt received sitting in recliner in room, denied pain and was agreeable to PT. Sit<>stands throughout session w/CGA and pt ambulated 250' w/no AD to 4th floor main gym. Noted significant L path deviation, scissoring gait and decreased step clearance of LLE. Provided verbal cues for improved step clearance of LLE, but pt struggling w/pants falling off and unresponsive to cues. In main gym, tightened pt's pants w/min A and pt ascended/descended 12 6" steps w/BUE support, step-to pattern and CGA. Verbal cues provided for sequencing, pt able to teach back prior to ascending first step. Pt transported to 1st floor outdoor patio w/total A for time management and ambulated >250' on uneven terrain and on incline/decline for dynamic balance challenge and emphasis on step clearance of LLE w/CGA. Noted decreased path deviation and scissoring, but pt began to utilize steppage pattern to clear LLE. Pt reported steppage made him "feel like he was getting stronger". At reflection fountain, alt. Toe taps to brick curb w/lateral lunge for improved dynamic stability and gross LE strength, x15 each side, CGA for steadying assist. Pt ambulated back to room >300' w/CGA, continued steppage pattern of LLE, no significant path deviations noted. Pt performed bed mobility mod I w/bedrail and doffed shoes at EOB  independently. Pt was left supine in bed w/all needs in reach.   Therapy Documentation Precautions:  Precautions Precautions: Fall Precaution Comments: L hemiparesis (monitor L ankle position) Restrictions Weight Bearing Restrictions: No   Therapy/Group: Individual Therapy Jill Alexanders Jemal Miskell, PT, DPT  01/17/2021, 7:49 AM

## 2021-01-17 NOTE — Progress Notes (Signed)
Physical Therapy Session Note  Patient Details  Name: Christopher [name] MRN: [id number] Date of Birth: [birth year]  Today's Date: 01/17/2021 PT Individual Time: 1400-1425 PT Individual Time Calculation (min): 25 min   Short Term Goals: Week 1:  PT Short Term Goal 1 (Week 1): Pt will perform sit<>stands using LRAD with CGA PT Short Term Goal 2 (Week 1): Pt will perform bed<>chair transfers using LRAD with CGA PT Short Term Goal 3 (Week 1): Pt will ambulate at least 172ft using LRAD with CGA PT Short Term Goal 4 (Week 1): Pt will ascend/descend 4 steps using HRs with CGA  Skilled Therapeutic Interventions/Progress Updates:    pt received in bed and agreeable to therapy. No complaint of pain. Supine<>sit with supervision, pt able to don/doff shoes with set up but required assist to tie. Pt participated in gait training up to 400 ft, demoed occ inversion and crossing RLE over mid line. Cues for wider BOS, long steps, and incr speed to normalize gait pattern. Pt then performed side steps, 2 x 50 ft, VC/TC to keep hips facing forward and keep toes forward. Pt returned to bed after session and was left with all needs in reach and alarm active.   Therapy Documentation Precautions:  Precautions Precautions: Fall Precaution Comments: L hemiparesis (monitor L ankle position) Restrictions Weight Bearing Restrictions: No     Therapy/Group: Individual Therapy  Juluis Rainier 01/17/2021, 2:30 PM

## 2021-01-17 NOTE — Progress Notes (Signed)
Occupational Therapy Session Note  Patient Details  Name: Christopher [name] MRN: [id number] Date of Birth: 20-Apr-1966  Today's Date: 01/17/2021 OT Individual Time: 1100-1156 OT Individual Time Calculation (min): 56 min    Short Term Goals: Week 1:  OT Short Term Goal 1 (Week 1): Pt will complete LB selfcare sit to stand with min guard assist. OT Short Term Goal 2 (Week 1): Pt will complete toilet transfers with min guard using LRAD. OT Short Term Goal 3 (Week 1): Pt will use the LUE at a diminished level to donn clothing with supervision overall when threading pants, socks, shirt.  Skilled Therapeutic Interventions/Progress Updates:    Patient in bed, alert and ready for therapy session.   He requests to work on left hand but states that he would also like to take a shower when offered.  He denies pain.  Supine to sitting edge of bed with CS.  Sit to stand CS.  Ambulation without AD to/from bed, shower seat, commode and w/c surface with CGA - one moderate LOB at start of session requiring assistance to regain.  Able to doff clothing with CS, shower CS (min cues for safety), dressing completed seated on commode surface with CS and set up.  Ambulation on unit pushing w/c with CGA.   Completed seated and standing motor control activities with focus on left hand/wrist coordination, strength, proximal stabilization.  Reviewed techniques for occ tightness in left hand.  Returned to bed at close of session with CS.  Bed alarm set and call bell/tray table in reach.    Therapy Documentation Precautions:  Precautions Precautions: Fall Precaution Comments: L hemiparesis (monitor L ankle position) Restrictions Weight Bearing Restrictions: No  Therapy/Group: Individual Therapy  Barrie Lyme 01/17/2021, 7:33 AM

## 2021-01-17 NOTE — Progress Notes (Signed)
Physical Therapy Session Note  Patient Details  Name: Christopher [name] MRN: [id number] Date of Birth: 03-Mar-1966  Today's Date: 01/17/2021 PT Individual Time: 0805-0904 PT Individual Time Calculation (min): 59 min   Short Term Goals: Week 1:  PT Short Term Goal 1 (Week 1): Pt will perform sit<>stands using LRAD with CGA PT Short Term Goal 2 (Week 1): Pt will perform bed<>chair transfers using LRAD with CGA PT Short Term Goal 3 (Week 1): Pt will ambulate at least 137ft using LRAD with CGA PT Short Term Goal 4 (Week 1): Pt will ascend/descend 4 steps using HRs with CGA   Skilled Therapeutic Interventions/Progress Updates:  Patient supine in bed on entrance to room. Patient alert and agreeable to PT session. Pt working with foam block in L hand.   Patient with no pain complaint throughout session. Although does relate pain from over past weekend from knee that was relieved with thermotherapy from warm shower. Related to pt that if pain increases during therapy, thermotherapy pack is available and can be provided for pain relief following session.  Therapeutic Activity: Bed Mobility: Patient performed supine <> sit with Mod I using bed rail. VC/ tc required for scoot to EOB to don shoes. Pt able to don shoes requiring extra time and setup but requires total A for tying laces.  Transfers: Patient performed sit<>stand and stand pivot transfers throughout session with supervision/ CGA with focus on performance with hands on anterior thighs for improved Bil muscle activation to rise and control descent. Provided verbal cues for technique and positioning. Performs well with supervision by end of session. CGA provided for pivot turns for safety in balance.   Gait Training:  Patient ambulated >300 ft using RW with CGA making way from large gym on 4th floor, into/ out of elevator, and to room on 5th floor. Demonstrated decreased step height bilaterally with NBOS with fatigue affecting step height.  Provided vc/ tc for increased knee flexion during swing phase of gait on LLE.  Neuromuscular Re-ed: NMR facilitated during session with focus on increased muscle activation, standing balance, dynamic gait, coordination, proprioception, and motor control. Pt guided in toe touches to first and second consecutive 6" steps. Pt then adds toe touch to third step and back down. Is able to perform bilaterally with good L knee stability in stance and mild dysmetria noted in L toe touches, especially with fatigue. Definitely noted in progression of toe taps with addition of 6" cones to first and second steps.   Forward lunges performed using RW and progressed to lunging forward onto Airex pad. Next progressed to dynamic stepping/ gait with LLE over cones, return performance with RLE. Progressed then to toe touch on cone prior to step over. Few LOB noted with pt able to self correct with stepping strategy and one instance of Min A from therapist. NMR performed for improvements in motor control and coordination, balance, sequencing, judgement, and self confidence/ efficacy in performing all aspects of mobility at highest level of independence.   Patient seated  in recliner at end of session with brakes locked, belt alarm set, and all needs within reach. Oriented to current time and time of next therapy session in 15 min.      Therapy Documentation Precautions:  Precautions Precautions: Fall Precaution Comments: L hemiparesis (monitor L ankle position) Restrictions Weight Bearing Restrictions: No General:   Vital Signs: Therapy Vitals Temp: 97.9 F (36.6 C) Temp Source: Oral Pulse Rate: 66 Resp: 16 BP: 125/78 Patient Position (if appropriate):  Lying Oxygen Therapy SpO2: 98 % Pain: Pain Assessment Pain Scale: 0-10 Pain Score: 0-No pain  Therapy/Group: Individual Therapy  Loel Dubonnet PT, DPT 01/17/2021, 7:52 AM

## 2021-01-17 NOTE — Progress Notes (Signed)
PROGRESS NOTE   Subjective/Complaints: No knee pain, feels ok today .  Still weak on left remembered the team conf is on Wed.  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 Recent Labs    01/17/21 0556  WBC 7.3  HGB 14.9  HCT 45.5  PLT 287    No results for input(s): NA, K, CL, CO2, GLUCOSE, BUN, CREATININE, CALCIUM in the last 72 hours.   Intake/Output Summary (Last 24 hours) at 01/17/2021 0800 Last data filed at 01/17/2021 0045 Gross per 24 hour  Intake 798 ml  Output 1300 ml  Net -502 ml         Physical Exam: Vital Signs Blood pressure 125/78, pulse 66, temperature 97.9 F (36.6 C), temperature source Oral, resp. rate 16, height 5\' 9"  (1.753 m), weight 52.6 kg, SpO2 98 %.  General: No acute distress Mood and affect are appropriate Heart: Regular rate and rhythm no rubs murmurs or extra sounds Lungs: Clear to auscultation, breathing unlabored, no rales or wheezes Abdomen: Positive bowel sounds, soft nontender to palpation, nondistended Extremities: No clubbing, cyanosis, or edema Skin: No evidence of breakdown, no evidence of rash   Musculoskeletal:     Cervical back: Normal range of motion and neck supple.     right knee tender with AROM/PROM. No instability, no effusion or warmth, large callus below patella Skin:    General: Skin is warm and dry.     Comments: Irritation along beard hairline  Neurological:     Mental Status: He is alert.     Comments: Alert and oriented to person and place Motor: RUE/RLE: 5/5 proximal distally in LUE/LE: 4/5 proximal distal Dysarthria       Assessment/Plan: 1. Functional deficits which require 3+ hours per day of interdisciplinary therapy in a comprehensive inpatient rehab setting. Physiatrist is providing close team supervision and 24 hour management of active medical problems listed below. Physiatrist and  rehab team continue to assess barriers to discharge/monitor patient progress toward functional and medical goals  Care Tool:  Bathing    Body parts bathed by patient: Left arm, Chest, Abdomen, Front perineal area, Buttocks, Right upper leg, Left upper leg, Face, Left lower leg, Right lower leg         Bathing assist Assist Level: Minimal Assistance - Patient > 75%     Upper Body Dressing/Undressing Upper body dressing   What is the patient wearing?: Pull over shirt    Upper body assist Assist Level: Minimal Assistance - Patient > 75%    Lower Body Dressing/Undressing Lower body dressing      What is the patient wearing?: Pants     Lower body assist Assist for lower body dressing: Minimal Assistance - Patient > 75%     Toileting Toileting    Toileting assist Assist for toileting: Minimal Assistance - Patient > 75% Assistive Device Comment: urinal   Transfers Chair/bed transfer  Transfers assist     Chair/bed transfer assist level: Minimal Assistance - Patient > 75%     Locomotion Ambulation   Ambulation assist      Assist level: Minimal  Assistance - Patient > 75% Assistive device: No Device Max distance: 242ft   Walk 10 feet activity   Assist     Assist level: Minimal Assistance - Patient > 75% Assistive device: No Device   Walk 50 feet activity   Assist    Assist level: Minimal Assistance - Patient > 75% Assistive device: No Device    Walk 150 feet activity   Assist    Assist level: Minimal Assistance - Patient > 75% Assistive device: No Device    Walk 10 feet on uneven surface  activity   Assist     Assist level: Minimal Assistance - Patient > 75% Assistive device: Other (comment) (none)   Wheelchair     Assist Is the patient using a wheelchair?: No             Wheelchair 50 feet with 2 turns activity    Assist            Wheelchair 150 feet activity     Assist          Blood pressure  125/78, pulse 66, temperature 97.9 F (36.6 C), temperature source Oral, resp. rate 16, height 5\' 9"  (1.753 m), weight 52.6 kg, SpO2 98 %.  Medical Problem List and Plan: 1.  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             -patient may shower             -ELOS/Goals: 13-16 days/Supervision/min a           -Continue CIR therapies including PT, OT, and SLP  2.  Impaired mobility: -DVT/anticoagulation:  Pharmaceutical: continue Lovenox             -antiplatelet therapy: DAPT X 3 weeks followed by ASA alone.  3. Pain Management: mild right knee pain, likely mild OA. Obviously works a lot with knees on the ground.  -continue tylenol  -ice prn, observe 4. Mood: LCSW to follow for evaluation and support.              -antipsychotic agents: N/A 5. Neuropsych: This patient is not capable of making decisions on his own behalf. 6. Skin/Wound Care: Routine pressure relief measures.  7. Fluids/Electrolytes/Nutrition: Monitor I/Os 8. HTN: Monitor BP TID w/permissive HTN for 5-7 days to  normalize OK for <220/120 (avoid hypoperfusion)              Vitals:   01/16/21 1933 01/17/21 0502  BP: (!) 146/72 125/78  Pulse: 78 66  Resp: 17 16  Temp: 98.4 F (36.9 C) 97.9 F (36.6 C)  SpO2: 95% 98%    9. Hyperlipidemia: Now on low dose statin- Continue Lipitor 20 mg/day 10. Polysubstance abuse:              consult neuropsych , + Cocaine and THC on drug screen 9/5 11. Leucocytosis: Resolved 12. Hyponatremia:             stable, monitor weekly 13. Mild anorexia BMI 17.12- provide dietary education.   14. Elevated BUN: placed nursing order to encourage 6-8 glasses of water per day  -f/u labs Monday  LOS: 5 days A FACE TO FACE EVALUATION WAS PERFORMED  Wednesday 01/17/2021, 8:00 AM

## 2021-01-18 DIAGNOSIS — I639 Cerebral infarction, unspecified: Secondary | ICD-10-CM | POA: Diagnosis not present

## 2021-01-18 NOTE — Progress Notes (Signed)
Physical Therapy Session Note  Patient Details  Name: Christopher [name] MRN: [id number] Date of Birth: 1965/07/14  Today's Date: 01/18/2021 PT Individual Time: 0800-0856 PT Individual Time Calculation (min): 56 min   Short Term Goals: Week 1:  PT Short Term Goal 1 (Week 1): Pt will perform sit<>stands using LRAD with CGA PT Short Term Goal 2 (Week 1): Pt will perform bed<>chair transfers using LRAD with CGA PT Short Term Goal 3 (Week 1): Pt will ambulate at least 169ft using LRAD with CGA PT Short Term Goal 4 (Week 1): Pt will ascend/descend 4 steps using HRs with CGA  Skilled Therapeutic Interventions/Progress Updates:    Pt received seated in bed, agreeable to PT session. No complaints of pain. Bed mobility mod I with use of bedrail. Pt able to don shoes while seated EOB, does require assist to tie laces. Seated BP 150/81, within acceptable parameters per medical team. Sit to stand with CGA and RW throughout session. Ambulation x 500 ft, 2 x 200 ft with RW and CGA navigating up/down inclines, through narrow spaces with obstacles, and over uneven ground outdoors in functional environment. Pt exhibits overall good safety awareness with some cueing needed for RW safety with turns and navigating over thresholds and with rugs on the ground. With onset of fatigue pt exhibits increase in scissoring of LLE and some L foot drag with gait. Pt able to correct gait deviations with cueing. Pt with no instances of L knee hyperextension this date. Standing alt L/R 4" step-taps 2 x 10 reps with RW and CGA for balance for LE coordination training. Sidesteps L/R 2 reps of 3 x 10 ft each direction with RW and CGA for balance, cues for L hip abduction and keeping toes pointed forwards. Pt left seated in recliner in room with needs in reach, quick release belt and chair alarm in place at end of session.  Therapy Documentation Precautions:  Precautions Precautions: Fall Precaution Comments: L hemiparesis (monitor L  ankle position) Restrictions Weight Bearing Restrictions: No   Therapy/Group: Individual Therapy   Peter Congo, PT, DPT, CSRS  01/18/2021, 11:23 AM

## 2021-01-18 NOTE — Progress Notes (Addendum)
PROGRESS NOTE   Subjective/Complaints:  Amb with walker Min A with PT, no breathing issues , denies bowel issues  CBC reviewed ROS: Patient denies CP, SOB N/V/D  Objective:   No results found. Recent Labs    01/17/21 0556  WBC 7.3  HGB 14.9  HCT 45.5  PLT 287    No results for input(s): NA, K, CL, CO2, GLUCOSE, BUN, CREATININE, CALCIUM in the last 72 hours.   Intake/Output Summary (Last 24 hours) at 01/18/2021 0812 Last data filed at 01/18/2021 0230 Gross per 24 hour  Intake 480 ml  Output 700 ml  Net -220 ml         Physical Exam: Vital Signs Blood pressure 130/73, pulse 66, temperature 98.9 F (37.2 C), temperature source Oral, resp. rate 18, height 5\' 9"  (1.753 m), weight 52.6 kg, SpO2 98 %.   General: No acute distress Mood and affect are appropriate Heart: Regular rate and rhythm no rubs murmurs or extra sounds Lungs: Clear to auscultation, breathing unlabored, no rales or wheezes Abdomen: Positive bowel sounds, soft nontender to palpation, nondistended Extremities: No clubbing, cyanosis, or edema   Musculoskeletal:     Cervical back: Normal range of motion and neck supple.     right knee tender with AROM/PROM. No instability, no effusion or warmth, large callus below patella Skin:    General: Skin is warm and dry.     Comments: Irritation along beard hairline  Neurological:     Mental Status: He is alert.     Comments: Alert and oriented to person and place Motor: RUE/RLE: 5/5 proximal distally in LUE/LE: 4/5 proximal distal Dysarthria       Assessment/Plan: 1. Functional deficits which require 3+ hours per day of interdisciplinary therapy in a comprehensive inpatient rehab setting. Physiatrist is providing close team supervision and 24 hour management of active medical problems listed below. Physiatrist and rehab team continue to assess barriers to discharge/monitor patient progress toward  functional and medical goals  Care Tool:  Bathing    Body parts bathed by patient: Left arm, Chest, Abdomen, Front perineal area, Buttocks, Right upper leg, Left upper leg, Face, Left lower leg, Right lower leg, Right arm         Bathing assist Assist Level: Supervision/Verbal cueing     Upper Body Dressing/Undressing Upper body dressing   What is the patient wearing?: Pull over shirt    Upper body assist Assist Level: Supervision/Verbal cueing    Lower Body Dressing/Undressing Lower body dressing      What is the patient wearing?: Pants     Lower body assist Assist for lower body dressing: Supervision/Verbal cueing     Toileting Toileting    Toileting assist Assist for toileting: Minimal Assistance - Patient > 75% Assistive Device Comment: urinal   Transfers Chair/bed transfer  Transfers assist     Chair/bed transfer assist level: Minimal Assistance - Patient > 75%     Locomotion Ambulation   Ambulation assist      Assist level: Contact Guard/Touching assist Assistive device: No Device Max distance: >300'   Walk 10 feet activity   Assist     Assist level: Contact Guard/Touching assist  Assistive device: No Device   Walk 50 feet activity   Assist    Assist level: Contact Guard/Touching assist Assistive device: No Device    Walk 150 feet activity   Assist    Assist level: Contact Guard/Touching assist Assistive device: No Device    Walk 10 feet on uneven surface  activity   Assist     Assist level: Contact Guard/Touching assist Assistive device: Other (comment) (None)   Wheelchair     Assist Is the patient using a wheelchair?: No             Wheelchair 50 feet with 2 turns activity    Assist            Wheelchair 150 feet activity     Assist          Blood pressure 130/73, pulse 66, temperature 98.9 F (37.2 C), temperature source Oral, resp. rate 18, height 5\' 9"  (1.753 m), weight 52.6 kg,  SpO2 98 %.  Medical Problem List and Plan: 1.  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             -patient may shower             -ELOS/Goals: 13-16 days/Supervision/min a           -Continue CIR therapies including PT, OT, and SLP - team conf in am  2.  Impaired mobility: -DVT/anticoagulation:  Pharmaceutical: continue Lovenox             -antiplatelet therapy: DAPT X 3 weeks followed by ASA alone.  3. Pain Management: mild right knee pain, likely mild OA. Obviously works a lot with knees on the ground.  -continue tylenol  -ice prn, observe 4. Mood: LCSW to follow for evaluation and support.              -antipsychotic agents: N/A 5. Neuropsych: This patient is not capable of making decisions on his own behalf. 6. Skin/Wound Care: Routine pressure relief measures.  7. Fluids/Electrolytes/Nutrition: Monitor I/Os 8. HTN: Monitor BP TID w/permissive HTN for 5-7 days to  normalize OK for <220/120 (avoid hypoperfusion)              Vitals:   01/17/21 1945 01/18/21 0433  BP: 140/73 130/73  Pulse: 71 66  Resp: 18 18  Temp: 98 F (36.7 C) 98.9 F (37.2 C)  SpO2: 94% 98%   Controlled 9/13 9. Hyperlipidemia: Now on low dose statin- Continue Lipitor 20 mg/day 10. Polysubstance abuse:              consult neuropsych , + Cocaine and THC on drug screen 9/5 11. Leucocytosis: Resolved 12. Hyponatremia:             stable, monitor weekly 13. Mild anorexia BMI 17.12- provide dietary education.   14. Elevated BUN: placed nursing order to encourage 6-8 glasses of water per day  -f/u labs Monday  LOS: 6 days A FACE TO FACE EVALUATION WAS PERFORMED  Monday 01/18/2021, 8:12 AM

## 2021-01-18 NOTE — Progress Notes (Signed)
Occupational Therapy Session Note  Patient Details  Name: Christopher [name] MRN: [id number] Date of Birth: 08-13-1965  Today's Date: 01/18/2021 OT Individual Time:  -       Short Term Goals: Week 1:  OT Short Term Goal 1 (Week 1): Pt will complete LB selfcare sit to stand with min guard assist. OT Short Term Goal 2 (Week 1): Pt will complete toilet transfers with min guard using LRAD. OT Short Term Goal 3 (Week 1): Pt will use the LUE at a diminished level to donn clothing with supervision overall when threading pants, socks, shirt.  Skilled Therapeutic Interventions/Progress Updates:    Session 1: (0903-1000)   No report of pain when asked.  He began with functional mobility down to the ortho gym with min guard assist and no device.  He then completed 3 sets of LUE strengthening with use of the UE ergonometer.  Resistance was set on level 5 for all sets with completion of the first set for 4 mins using BUEs.  The final 2 sets were completed for 3 mins each with isolated use of the LUE only.  He needed to regrip multiple times for all sets with the LUE overall, but improved from last week.  After completion of UE strengthening, he was able to ambulate to the other therapy gym where he continued work on LUE coordination and functional reach using [employer].  He was able to pick up checkers one at a time and place in the grid with 95% accuracy.  He did drop 3-4 during multiple attempts (greater than 40).  He was able to ambulate back to his room with min guard to complete session.  He transferred to supine from sitting with supervision.  Safety alarm in place with call button and phone in reach.    Session 2:  (0160-1093)  No report of pain to begin session.  Pt in bed to start with transfer to EOB to begin session.  He was able to donn and tie his shoes with setup and increased time.  He then completed transfer to the toilet with min guard assist for standing to toilet.  He then ambulated to the  sink for washing his hands at min guard assist.  Had him rest briefly on the EOB while therapist obtained a mask.  He then ambulated down to the therapy gym with min guard assist.  Had him work on LUE strengthening with use of a therapy ball in sitting and standing.  He worked on bilateral shoulder flexion to complete shoulder flexion and place and hold over his head, min demonstrational cueing to maintain left elbow extension.  He completed this for several repetitions, having to bring the ball down from overhead and hold as well as approximately 90 degrees shoulder flexion.  Progressed to working on picking up and placing 2" larger pegs in a wooden peg board using the LUE.  Increased difficulty noted picking them up from laying on their side as well as manipulating them to place them in the board.  He dropped approximately 5 pegs onto the floor with many dropping on the table when attempting to manipulate them.  Ambulated back to the room at the end of the session at min guard with transfer to the bed at supervision level.  Call button and phone in reach with safety alarm in place.    Therapy Documentation Precautions:  Precautions Precautions: Fall Precaution Comments: L hemiparesis (monitor L ankle position) Restrictions Weight Bearing Restrictions: No  Pain: Pain Assessment Pain Scale: 0-10 Pain Score: 0-No pain ADL: See Care Tool Section for some details of mobility and selfcare  Therapy/Group: Individual Therapy  Arthi Mcdonald OTR/L 01/18/2021, 9:29 AM

## 2021-01-18 NOTE — Progress Notes (Signed)
Physical Therapy Session Note  Patient Details  Name: Christopher [name] MRN: [id number] Date of Birth: [birth year]  Today's Date: 01/18/2021 PT Individual Time: 1103-1201 PT Individual Time Calculation (min): 58 min   Short Term Goals: Week 1:  PT Short Term Goal 1 (Week 1): Pt will perform sit<>stands using LRAD with CGA PT Short Term Goal 2 (Week 1): Pt will perform bed<>chair transfers using LRAD with CGA PT Short Term Goal 3 (Week 1): Pt will ambulate at least 130ft using LRAD with CGA PT Short Term Goal 4 (Week 1): Pt will ascend/descend 4 steps using HRs with CGA  Skilled Therapeutic Interventions/Progress Updates:    Pt received supine in bed and agreeable to therapy session. Supine>sitting EOB mod-I. Sitting EOB with distant supervision for trunk control, donned tennis shoes with set-up assist - pt able to tie shoes! Sit<>stands, no AD, with supervision throughout session. Gait training ~276ft to main therapy gym, no AD, with close supervision/CGA - pt able to recall the cuing provided in prior therapy sessions to maintain normal BOS to avoid scissoring though did have 2x of minor instances but pt able to recover balance without increased assistance. Patient participated in [facility] and demonstrates moderate fall risk as noted by score of 48/56, but significantly improved compared to 15/56 on 01/13/21. (<36= high risk for falls, close to 100%; 37-45 significant >80%; 46-51 moderate >50%; 52-55 lower >25%). Pt participated in Functional Gait Assessment (FGA) with score of 18/30 demonstrating high fall risk (low fall risk 25-28, medium fall risk 19-24, and high fall risk <19). Discussed results of balance assessments with patient.   Stair navigation training 4steps x7 with reciprocal pattern throughout starting with B UE support on HRs with supervision progressed to no UE support with CGA for steadying. Gait training ~281ft back to room including ascending 2 flights (11 steps) of stairs  using L UE support on L HR only with CGA for safety and reciprocal stepping pattern - 1 instance of catching L toes on step during ascent but able to recover without increased assist.  At end of session pt left supine in bed with needs in reach, meal tray set-up, and bed alarm on.  Therapy Documentation Precautions:  Precautions Precautions: Fall Precaution Comments: L hemiparesis (monitor L ankle position) Restrictions Weight Bearing Restrictions: No   Pain: Denies pain during session.   Balance: Standardized Balance Assessment Standardized Balance Assessment: Berg Balance Test;Functional Gait Assessment Berg Balance Test Sit to Stand: Able to stand without using hands and stabilize independently Standing Unsupported: Able to stand safely 2 minutes Sitting with Back Unsupported but Feet Supported on Floor or Stool: Able to sit safely and securely 2 minutes Stand to Sit: Sits safely with minimal use of hands Transfers: Able to transfer safely, minor use of hands Standing Unsupported with Eyes Closed: Able to stand 10 seconds with supervision Standing Ubsupported with Feet Together: Able to place feet together independently and stand for 1 minute with supervision (slight L lean) From Standing, Reach Forward with Outstretched Arm: Can reach forward >12 cm safely (5") From Standing Position, Pick up Object from Floor: Able to pick up shoe, needs supervision From Standing Position, Turn to Look Behind Over each Shoulder: Looks behind one side only/other side shows less weight shift Turn 360 Degrees: Able to turn 360 degrees safely one side only in 4 seconds or less Standing Unsupported, Alternately Place Feet on Step/Stool: Able to complete 4 steps without aid or supervision Standing Unsupported, One Foot in Front:  Able to place foot tandem independently and hold 30 seconds Standing on One Leg: Able to lift leg independently and hold > 10 seconds Total Score: 48 Functional Gait   Assessment Gait assessed : Yes Gait Level Surface: Walks 20 ft in less than 7 sec but greater than 5.5 sec, uses assistive device, slower speed, mild gait deviations, or deviates 6-10 in outside of the 12 in walkway width. Change in Gait Speed: Able to change speed, demonstrates mild gait deviations, deviates 6-10 in outside of the 12 in walkway width, or no gait deviations, unable to achieve a major change in velocity, or uses a change in velocity, or uses an assistive device. Gait with Horizontal Head Turns: Performs head turns smoothly with slight change in gait velocity (eg, minor disruption to smooth gait path), deviates 6-10 in outside 12 in walkway width, or uses an assistive device. Gait with Vertical Head Turns: Performs task with slight change in gait velocity (eg, minor disruption to smooth gait path), deviates 6 - 10 in outside 12 in walkway width or uses assistive device Gait and Pivot Turn: Pivot turns safely in greater than 3 sec and stops with no loss of balance, or pivot turns safely within 3 sec and stops with mild imbalance, requires small steps to catch balance. Step Over Obstacle: Is able to step over one shoe box (4.5 in total height) without changing gait speed. No evidence of imbalance. Gait with Narrow Base of Support: Ambulates less than 4 steps heel to toe or cannot perform without assistance. (L LOB after 6 steps) Gait with Eyes Closed: Walks 20 ft, uses assistive device, slower speed, mild gait deviations, deviates 6-10 in outside 12 in walkway width. Ambulates 20 ft in less than 9 sec but greater than 7 sec. Ambulating Backwards: Walks 20 ft, uses assistive device, slower speed, mild gait deviations, deviates 6-10 in outside 12 in walkway width. Steps: Alternating feet, must use rail. Total Score: 18    Therapy/Group: Individual Therapy  Ginny Forth , PT, DPT, NCS, CSRS 01/18/2021, 12:33 PM

## 2021-01-19 DIAGNOSIS — I639 Cerebral infarction, unspecified: Secondary | ICD-10-CM | POA: Diagnosis not present

## 2021-01-19 NOTE — Patient Care Conference (Signed)
Inpatient RehabilitationTeam Conference and Plan of Care Update Date: 01/19/2021   Time: 10:39 AM    Patient Name: Christopher [name]      Medical Record Number: [id number]  Date of Birth: [birth year] Sex: Male         Room/Bed: 5C08C/5C08C-01 Payor Info: Payor: /    Admit Date/Time:  01/12/2021  2:20 PM  Primary Diagnosis:  Acute CVA (cerebrovascular accident) [facility])  Hospital Problems: Principal Problem:   Acute CVA (cerebrovascular accident) [facility])    Expected Discharge Date: Expected Discharge Date: 01/21/21  Team Members Present: Physician leading conference: Dr. Claudette Laws Social Worker Present: [name], BSW Nurse Present: Chana Bode, RN PT Present: Casimiro Needle, PT OT Present: Perrin Maltese, OT PPS Coordinator present : Fae Pippin, SLP     Current Status/Progress Goal Weekly Team Focus  Bowel/Bladder             Swallow/Nutrition/ Hydration             ADL's   Supervison for UB selfcare with min guard for LB selfcare, min to min guard for transfers and mobility without an assistive device.  LUE Brunnstrum stage V-IV uses it at a diminshed level.  Decreased FM coordination as well.  supervision  selfcare retraining, balance retraining, DME education, therapeutic activites, neuromuscular re-education, pt education   Mobility   supervision bed mobility, supervision sit<>stands, CGA progressing to supervision stand pivot transfers without AD, CGA with intermittent min assist gait >21ft without AD over uneven ground, CGA 12 stair navigation using HR - Berg Score 48/56 and FGA 18/30  supervision overall  dynamic standing balance, L LE and L UE NMR, dynamic gait training, activity tolerance/endurance, pt education   Communication             Safety/Cognition/ Behavioral Observations            Pain             Skin               Discharge Planning:  Patient uninsured. Discharging home with a friend and friend's spouse. Able to recieve  intermittent assistance from sister, family and friends.   Team Discussion: History of polysubstance abuse with new CVA. BP elevated, MD adjusting medications. Review of support at discharge and destination options available.  Patient on target to meet rehab goals: yes, currently supervision for bathing and dressing, supervision for bed mobility. Requires up to min assist for sit - stand and CGA for turning without an assistive device due to scissoring gait on the right, prompted by left hip drop causing a loss of balance to the left. Demo diminished use but incorporates left UE into care. Goals for discharge set at supervision level overall.  *See Care Plan and progress notes for long and short-term goals.   Revisions to Treatment Plan:  Working on fine Chief Strategy Officer device with ambulation trials  Teaching Needs: Safety, medications, secondary risk management, transfers, etc  Current Barriers to Discharge: Decreased caregiver support, Insurance for SNF coverage, and lack of insurance for follow up services  Possible Resolutions to Barriers: Recommend OP follow up and assistive device      Medical Summary Current Status: Blood pressure labile, no pain complaints, left sided weakness slowly improving  Barriers to Discharge: Medical stability   Possible Resolutions to [employer] Focus: Medical management of blood pressure, need to assess home environment   Continued Need for Acute Rehabilitation Level of Care: The patient requires daily medical management  by a physician with specialized training in physical medicine and rehabilitation for the following reasons: Direction of a multidisciplinary physical rehabilitation program to maximize functional independence : Yes Medical management of patient stability for increased activity during participation in an intensive rehabilitation regime.: Yes Analysis of laboratory values and/or radiology reports with any subsequent  need for medication adjustment and/or medical intervention. : Yes   I attest that I was present, lead the team conference, and concur with the assessment and plan of the team.   Chana Bode B 01/19/2021, 10:59 AM

## 2021-01-19 NOTE — Progress Notes (Signed)
Occupational Therapy Session Note  Patient Details  Name: Christopher [name] MRN: [id number] Date of Birth: [birth year]  Today's Date: 01/19/2021 OT Individual Time: 0539-7673 OT Individual Time Calculation (min): 70 min    Short Term Goals: Week 1:  OT Short Term Goal 1 (Week 1): Pt will complete LB selfcare sit to stand with min guard assist. OT Short Term Goal 2 (Week 1): Pt will complete toilet transfers with min guard using LRAD. OT Short Term Goal 3 (Week 1): Pt will use the LUE at a diminished level to donn clothing with supervision overall when threading pants, socks, shirt.  Skilled Therapeutic Interventions/Progress Updates:    Pt completed shower and dressing to begin session.  He was able to complete undressing with supervision and transfer into the walk-in shower at min guard secondary to one LOB stepping over the edge without holding onto anything.  He was able to regain his balance without assist.  He completed all bathing sit to stand with supervision.  Once he dried off, he was able to complete dressing sit to stand from the EOB at close supervision.  He was able to tie his shoes with setup assist as well.  He next ambulated down to the ortho gym at min guard with no assistive device.  Had him work on LUE coordination and strengthening with US of the BITS to start.  He was able to complete 3 sets of 2 mins on Visual Scanning program.  First two sets were with 66% and then 71% accuracy with average reaction time at 2-2.1 seconds.  Ataxia noted with reaching.  Added 1 lb weight to the left wrist with completion of final set which he completed with 62% accuracy in an average of 2.2 seconds.  Transitioned over to the mat for rest of session with completion of quadriped activities including pushups for 2 sets of 20 reps as well as place and hold with the LUE in weightbearing and RUE lifted off the mat reaching to target.  Had him work on washing the mat with the LUE in quadriped for increased  weightbearing before ambulating back to the room.  He was left in the bed per his request with the call button and phone in reach and safety belt in place.    Therapy Documentation Precautions:  Precautions Precautions: Fall Precaution Comments: L hemiparesis (monitor L ankle position) Restrictions Weight Bearing Restrictions: No   Pain: Pain Assessment Pain Scale: Faces Pain Score: 0-No pain ADL: See Care Tool Section for some details of mobility and selfcaer  Therapy/Group: Individual Therapy  Kharlie Bring OTR/L 01/19/2021, 10:34 AM

## 2021-01-19 NOTE — Progress Notes (Signed)
Patient ID: Christopher [name], male   DOB: May 02, 1966, 55 y.o.   MRN: [id number] Team Conference Report to Patient/Family  Team Conference discussion was reviewed with the patient and caregiver, including goals, any changes in plan of care and target discharge date.  Patient and caregiver express understanding and are in agreement.  The patient has a target discharge date of 01/21/21.  Sw met with pt, called sister at bedside provided conference updates. Pt and sister pleased with updates. Sister will have a family member pick up pt on Friday for d/c. No additional questions or concerns  Dyanne Iha 01/19/2021, 2:23 PM

## 2021-01-19 NOTE — Progress Notes (Addendum)
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 Recent Labs    01/17/21 0556  WBC 7.3  HGB 14.9  HCT 45.5  PLT 287    No results for input(s): NA, K, CL, CO2, GLUCOSE, BUN, CREATININE, CALCIUM in the last 72 hours.   Intake/Output Summary (Last 24 hours) at 01/19/2021 0833 Last data filed at 01/19/2021 0735 Gross per 24 hour  Intake 1020 ml  Output 1475 ml  Net -455 ml         Physical Exam: Vital Signs Blood pressure (!) 149/69, pulse (!) 57, temperature 98.2 F (36.8 C), temperature source Oral, resp. rate 18, height _0  (1.753 m), weight 52.6 kg, SpO2 (!) 89 %.  General: No acute distress Mood and affect are appropriate Heart: Regular rate and rhythm no rubs murmurs or extra sounds Lungs: Clear to auscultation, breathing unlabored, no rales or wheezes Abdomen: Positive bowel sounds, soft nontender to palpation, nondistended Extremities: No clubbing, cyanosis, or edema Skin: No evidence of breakdown, no evidence of rash  Musculoskeletal:     Cervical back: Normal range of motion and neck supple.     right knee tender with AROM/PROM. No instability, no effusion or warmth, large callus below patella Skin:    General: Skin is warm and dry.     Comments: Irritation along beard hairline  Neurological:     Mental Status: He is alert.     Comments: Alert and oriented to person and place Motor: RUE/RLE: 5/5 proximal distally in LUE/LE: 4/5 proximal distal Dysarthria       Assessment/Plan: 1. Functional deficits which require 3+ hours per day of interdisciplinary therapy in a comprehensive inpatient rehab setting. Physiatrist is providing close team supervision and 24 hour management of active medical problems listed below. Physiatrist and rehab team continue  to assess barriers to discharge/monitor patient progress toward functional and medical goals  Care Tool:  Bathing    Body parts bathed by patient: Left arm, Chest, Abdomen, Front perineal area, Buttocks, Right upper leg, Left upper leg, Face, Left lower leg, Right lower leg, Right arm         Bathing assist Assist Level: Supervision/Verbal cueing     Upper Body Dressing/Undressing Upper body dressing   What is the patient wearing?: Pull over shirt    Upper body assist Assist Level: Supervision/Verbal cueing    Lower Body Dressing/Undressing Lower body dressing      What is the patient wearing?: Pants     Lower body assist Assist for lower body dressing: Supervision/Verbal cueing     Toileting Toileting    Toileting assist Assist for toileting: Minimal Assistance - Patient > 75% Assistive Device Comment: urinal   Transfers Chair/bed transfer  Transfers assist     Chair/bed transfer assist level: Supervision/Verbal cueing     Locomotion Ambulation   Ambulation assist      Assist level: Contact Guard/Touching assist Assistive device: No Device Max distance: 246f   Walk 10 feet activity   Assist     Assist level:  Contact Guard/Touching assist Assistive device: No Device   Walk 50 feet activity   Assist    Assist level: Contact Guard/Touching assist Assistive device: No Device    Walk 150 feet activity   Assist    Assist level: Contact Guard/Touching assist Assistive device: No Device    Walk 10 feet on uneven surface  activity   Assist     Assist level: Contact Guard/Touching assist Assistive device: Other (comment) (None)   Wheelchair     Assist Is the patient using a wheelchair?: No             Wheelchair 50 feet with 2 turns activity    Assist            Wheelchair 150 feet activity     Assist          Blood pressure (!) 149/69, pulse (!) 57, temperature 98.2 F (36.8 C), temperature  source Oral, resp. rate 18, height _0  (1.753 m), weight 52.6 kg, SpO2 (!) 89 %.  Medical Problem List and Plan: 1.  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             -patient may shower             -ELOS/Goals: 13-16 days/Supervision/min a           -Continue CIR therapies including PT, OT,- Team conference today please see physician documentation under team conference tab, met with team  to discuss problems,progress, and goals. Formulized individual treatment plan based on medical history, underlying problem and comorbidities.  2.  Impaired mobility: -DVT/anticoagulation:  Pharmaceutical: continue Lovenox             -antiplatelet therapy: DAPT X 3 weeks followed by ASA alone.  3. Pain Management: mild right knee pain, likely mild OA.  -continue tylenol  -ice prn, observe 4. Mood: LCSW to follow for evaluation and support.              -antipsychotic agents: N/A 5. Neuropsych: This patient is not capable of making decisions on his own behalf. 6. Skin/Wound Care: Routine pressure relief measures.  7. Fluids/Electrolytes/Nutrition: Monitor I/Os 8. HTN: Monitor BP TID w/permissive HTN for 5-7 days to  normalize OK for <220/120 (avoid hypoperfusion)              Vitals:   01/18/21 1935 01/19/21 0537  BP: (!) 165/82 (!) 149/69  Pulse: 66 (!) 57  Resp: 18 18  Temp: 98.4 F (36.9 C) 98.2 F (36.8 C)  SpO2: 97% (!) 89%   Fair control 9/14, no antihypertensive meds if BP stays up will start ACE-I  9. Hyperlipidemia: Now on low dose statin- Continue Lipitor 20 mg/day 10. Polysubstance abuse:              consult neuropsych , + Cocaine and THC on drug screen 9/5 11. Leucocytosis: Resolved 12. Hyponatremia:             stable, monitor weekly 13. Mild anorexia BMI 17.12- provide dietary education.   14. Elevated  BUN: placed nursing order to encourage 6-8 glasses of water per day  -f/u labs Monday  LOS: 7 days A FACE TO FACE EVALUATION WAS PERFORMED  Charlett Blake 01/19/2021, 8:33 AM

## 2021-01-19 NOTE — Progress Notes (Signed)
Physical Therapy Session Note  Patient Details  Name: Christopher [name] MRN: [id number] Date of Birth: 1966-01-11  Today's Date: 01/19/2021 PT Individual Time: 1103-1200 PT Individual Time Calculation (min): 57 min   Short Term Goals: Week 1:  PT Short Term Goal 1 (Week 1): Pt will perform sit<>stands using LRAD with CGA PT Short Term Goal 2 (Week 1): Pt will perform bed<>chair transfers using LRAD with CGA PT Short Term Goal 3 (Week 1): Pt will ambulate at least 147ft using LRAD with CGA PT Short Term Goal 4 (Week 1): Pt will ascend/descend 4 steps using HRs with CGA  Skilled Therapeutic Interventions/Progress Updates:    Patient received reclined in bed, agreeable to PT. He denies pain. Patient able to come sit edge of bed with supervision and don sneakers with supervision. Patient ambulating around bed with RW and CGA to wc. Patient with noted impulsivity and poor safety awareness attempting to stand prior to PT being ready and prior to having gait belt donned. PT transporting patient in wc to therapy gym for time management and energy conservation. NMES to L ant tib with AAROM through dorsiflexion + eversion. Patient completing standing dynamic balance + dual task with RLE on 4" step, L LE on [employer]irex manipulating horseshoes from 1 cone to the next reaching outside BOS. Patient cued to maintain "soft bed" in L knee to prevent hyperextension. Patient progressing to ball chest press + shoulder press with feet in same stance and then weighted ball chest press + shoulder press with feet set the same. No noted LOB and appropriate ankle strategy noted B. Patient ambulating 418ft x2 with RW and MinA. Patient required cues to maintain appropriate BOS- patient tended to adduct R LE- forward gaze and L foot clearance. Patient again with poor safety awareness, picking up the RW when turning and crossing his legs resulting in PT needing to provide additional assist for patient to regain balance. Patient returning  to room in wc, transferring to bed with CGA and RW. Bed alarm on, call light within reach.   Therapy Documentation Precautions:  Precautions Precautions: Fall Precaution Comments: L hemiparesis (monitor L ankle position) Restrictions Weight Bearing Restrictions: No     Therapy/Group: Individual Therapy  Elizebeth Koller, PT, DPT, CBIS  01/19/2021, 7:50 AM

## 2021-01-19 NOTE — Progress Notes (Signed)
Physical Therapy Session Note  Patient Details  Name: Christopher [name] MRN: [id number] Date of Birth: [birth year]  Today's Date: 01/19/2021 PT Individual Time: 1351-1500 PT Individual Time Calculation (min): 69 min   Short Term Goals: Week 1:  PT Short Term Goal 1 (Week 1): Pt will perform sit<>stands using LRAD with CGA PT Short Term Goal 2 (Week 1): Pt will perform bed<>chair transfers using LRAD with CGA PT Short Term Goal 3 (Week 1): Pt will ambulate at least 144ft using LRAD with CGA PT Short Term Goal 4 (Week 1): Pt will ascend/descend 4 steps using HRs with CGA  Skilled Therapeutic Interventions/Progress Updates:    Pt received supine in bed and agreeable to therapy session. 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 Pt reports level entry or possible 1 STE his sister's house. Supine>sitting L EOB mod-I.   Sit<>stands with supervision during session. Gait training ~236ft to main therapy gym including on/off elevated, no AD, with close supervision for safety - no instances of noticeable LOB.   Performed 2 sets of each of the following exercises and educated pt on safe set-up of exercises with his sister's assistance for standing exercises to increase safety - cuing throughout for proper form/technique and wrote that feedback on pt's HEP - printout provided.  Access Code: [email or web address] URL: [email or web address] Date: 01/19/2021 Prepared by: Casimiro Needle  Exercises Supine Bridge - 1 x daily - 7 x weekly - 2 sets - 20 reps Single Leg Bridge - 1 x daily - 7 x weekly - 2 sets - 20 reps Sit to Stand with Arms Crossed - 1 x daily - 7 x weekly - 2 sets - 20 reps Standing Tandem Balance with Counter Support - 1 x daily - 7 x weekly - 2 sets - 30 seconds hold Standing Single Leg Stance with Counter Support - 1 x daily - 7 x weekly - 2 sets - 10  seconds hold Side Stepping with Counter Support - 1 x daily - 7 x weekly - 3 sets - 10 reps Backward Walking with Counter Support - 1 x daily - 7 x weekly - 3 sets - 10 reps  Pt reports feeling that he does not need a RW but is open to suggestion of SPC. Therapist provided [facility] with proper height adjustment. Gait training ~269ft inside with cuing initially for sequencing of AD and LE stepping to maintain reciprocal stepping pattern - close supervision for safety but pt appearing more stable/confident with his gait - pt agreeable to recommendation of using a single point cane [facility]) and planning to purchase one on his own.   Educated pt on BE FAST - signs and symptoms of a stroke. Educated on safe floor transfer technique and what to do in event of a fall and when to call 911. Pt demonstrated ability to get on/off floor with supervision for safety.    Gait training >1,059ft using SPC on/off the elevator down to the outside with close supervision - outdoor gait training on brick pavement, up/down outdoor stairs using 1 HR, up/down ramps, and over unlevel surfaces all with close supervision. During stair navigation: educated pt on holding cane in L hand while ascending/descending stairs in order to allow R UE support on stable handrail - performed stair navigation with reciprocal pattern on ascent and varying reciprocal vs step-to on descent  all with close supervision for safety. Gait training >1,062ft back up to room using [facility] with supervision and no instances of noticeable instability.  At end of session pt left supine in bed with needs in reach and bed alarm on.  Therapy Documentation Precautions:  Precautions Precautions: Fall Precaution Comments: L hemiparesis (monitor L ankle position) Restrictions Weight Bearing Restrictions: No   Pain:   Denies pain during session.    Therapy/Group: Individual Therapy  Ginny Forth , PT, DPT, NCS, CSRS 01/19/2021, 2:21 PM

## 2021-01-20 DIAGNOSIS — I639 Cerebral infarction, unspecified: Secondary | ICD-10-CM | POA: Diagnosis not present

## 2021-01-20 LAB — BASIC METABOLIC PANEL
Anion gap: 8 (ref 5–15)
BUN: 19 mg/dL (ref 6–20)
CO2: 26 mmol/L (ref 22–32)
Calcium: 9 mg/dL (ref 8.9–10.3)
Chloride: 100 mmol/L (ref 98–111)
Creatinine, Ser: 1.07 mg/dL (ref 0.61–1.24)
GFR, Estimated: >60 mL/min (ref >60)
Glucose, Bld: 95 mg/dL (ref 70–99)
Potassium: 4.3 mmol/L (ref 3.5–5.1)
Sodium: 134 mmol/L — ABNORMAL LOW (ref 135–145)

## 2021-01-20 MED ORDER — LISINOPRIL 5 MG PO TABS
5.0000 mg | ORAL_TABLET | Freq: Every day | ORAL | Status: DC
  Administered 2021-01-20 – 2021-01-21 (×2): 5 mg via ORAL
  Filled 2021-01-20 (×2): qty 1

## 2021-01-20 NOTE — Progress Notes (Signed)
Physical Therapy Discharge Summary  Patient Details  Name: Christopher [name] MRN: [id number] Date of Birth: 1966-01-16  Today's Date: 01/20/2021 PT Individual Time: 1310-1419 PT Individual Time Calculation (min): 69 min    Patient has met 10 of 10 long term goals due to improved activity tolerance, improved balance, improved postural control, increased strength, ability to compensate for deficits, functional use of  left upper extremity and left lower extremity, improved attention, improved awareness, and improved coordination.  Patient to discharge at an ambulatory level Supervision using Frost.   Patient's care partner is independent to provide the necessary physical assistance at discharge.  All goals met.  Recommendation:  Patient will benefit from ongoing skilled PT services in outpatient setting to continue to advance safe functional mobility, address ongoing impairments in higher level dynamic standing balance, higher level dynamic gait training with LRAD, L UE NMR, endurance, and minimize fall risk. However, due to insurance limitations pt may not be able to participate in outpatient therapy; therefore, an HEP was provided to continue addressing pt's impairments.  Equipment: Recommending SPC and pt planning to purchase on his own. Provided heel wedge  with lateral posting to decrease foot/ankle inversion.   Reasons for discharge: treatment goals met and discharge from hospital  Patient/family agrees with progress made and goals achieved: Yes  Skilled Therapeutic Interventions/Progress Updates:  Pt received supine in bed and agreeable to therapy session. Supine>sitting independently. Sit<>stands using SPC mod-I during session. Gait training >1,568f using SPC to outside entrance with supervision, no instances of noticeable instability and pt demoing good carryover of education/training on AD management - pt continues with decreased gait speed although improved using SPC compared to no AD.  Pt able to navigate down 2 flights of stairs (11 steps) carrying SPC in L UE and holding handrail with R UE and close supervision for safety on way towards outside. Gait training outside including traversing over grass and mulch on unlevel surfaces using SPC with close supervision for safety to prepare for D/C as pt reports his sister has a gravel driveway. Gait training back up to CIR floor using [facility] supervision using elevator. Therapist reinforced BE FAST sign/symptoms of a stroke and falls education from yesterday with pt able to recall this information with only min cuing.  Performed L UE NMR and dynamic standing/gait tasks via tossing horseshoes to external target and place clothespins on clothesline with dynamic gait challenge of forwards/backwards gait through cone weaving and stepping over poles - close supervision with intermittent CGA for steadying. Pt reports no questions/concerns regarding upcoming D/C. At end of session pt left supine in bed with needs in reach and bed alarm on.  PT Discharge Precautions/Restrictions Restrictions Weight Bearing Restrictions: No Pain Pain Assessment Pain Scale: 0-10 Pain Score: 0-No pain Pain Interference Pain Interference Pain Effect on Sleep: 0. Does not apply - I have not had any pain or hurting in the past 5 days Pain Interference with Therapy Activities: 1. Rarely or not at all (has not had pain but has had therapy) Pain Interference with Day-to-Day Activities: 1. Rarely or not at all Vision/Perception  Vision - History Ability to See in Adequate Light: 0 Adequate Perception Perception: Within Functional Limits Praxis Praxis: Intact  Cognition Overall Cognitive Status: Within Functional Limits for tasks assessed Arousal/Alertness: Awake/alert Orientation Level: Oriented X4 Year: 2022 Month: September Day of Week: Correct Focused Attention: Appears intact Sustained Attention: Appears intact Selective Attention: Appears  intact Memory: Appears intact Awareness: Appears intact Problem Solving: Appears intact  Safety/Judgment: Appears intact Sensation Sensation Light Touch: Appears Intact Hot/Cold: Not tested Proprioception: Appears Intact Stereognosis: Not tested Coordination Gross Motor Movements are Fluid and Coordinated: No Coordination and Movement Description: Continues to have some impaired gross movements in L LE that present primarily when fatigued Heel Shin Test: WNL and equal bilaterally Motor  Motor Motor: Other (comment) Motor - Discharge Observations: Still with mild LUE hemiparesis  Mobility Bed Mobility Bed Mobility: Supine to Sit;Sit to Supine Supine to Sit: Independent Sit to Supine: Independent Transfers Transfers: Sit to Stand;Stand to Sit;Stand Pivot Transfers Sit to Stand: Independent with assistive device Stand to Sit: Independent with assistive device Stand Pivot Transfers: Supervision/Verbal cueing Stand Pivot Transfer Details: Verbal cues for technique;Verbal cues for sequencing;Verbal cues for safe use of DME/AE Transfer (Assistive device): Straight cane Locomotion  Gait Ambulation: Yes Gait Assistance: Supervision/Verbal cueing Gait Distance (Feet): 1500 Feet Assistive device: Straight cane Gait Assistance Details: Verbal cues for sequencing;Verbal cues for technique;Verbal cues for safe use of DME/AE Gait Gait: Yes Gait Pattern: Impaired (only mild impairments most noticeable with fatigue) Gait Pattern: Poor foot clearance - left;Decreased stance time - left;Narrow base of support Gait velocity: decreased although pt reports 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 Stairs: Yes Stairs Assistance: Supervision/Verbal cueing Stair Management Technique: One rail Right;One rail Left;With cane Number of Stairs: 22 Height of Stairs: 6 Ramp: Supervision/Verbal cueing Curb: Supervision/Verbal cueing Wheelchair Mobility Wheelchair  Mobility: No  Trunk/Postural Assessment  Cervical Assessment Cervical Assessment: Within Functional Limits Thoracic Assessment Thoracic Assessment: Exceptions to [facility] (slight thoracic rounding) Lumbar Assessment Lumbar Assessment: Exceptions to [facility] (slight posterior pelvic tilt) Postural Control Postural Control: Within Functional Limits  Balance Standardized Balance Assessment Standardized Balance Assessment: Berg Balance Test;Functional Gait Assessment Berg Balance Test Sit to Stand: Able to stand without using hands and stabilize independently Standing Unsupported: Able to stand safely 2 minutes Sitting with Back Unsupported but Feet Supported on Floor or Stool: Able to sit safely and securely 2 minutes Stand to Sit: Sits safely with minimal use of hands Transfers: Able to transfer safely, minor use of hands Standing Unsupported with Eyes Closed: Able to stand 10 seconds with supervision Standing Ubsupported with Feet Together: Able to place feet together independently and stand for 1 minute with supervision (slight L lean) From Standing, Reach Forward with Outstretched Arm: Can reach confidently >25 cm (10") From Standing Position, Pick up Object from Floor: Able to pick up shoe safely and easily From Standing Position, Turn to Look Behind Over each Shoulder: Looks behind from both sides and weight shifts well Turn 360 Degrees: Able to turn 360 degrees safely in 4 seconds or less Standing Unsupported, Alternately Place Feet on Step/Stool: Able to complete 4 steps without aid or supervision Standing Unsupported, One Foot in Front: Able to place foot tandem independently and hold 30 seconds Standing on One Leg: Able to lift leg independently and hold > 10 seconds Total Score: 52 Static Sitting Balance Static Sitting - Balance Support: Feet supported Static Sitting - Level of Assistance: 7: Independent Dynamic Sitting Balance Dynamic Sitting - Balance Support: During functional  activity Dynamic Sitting - Level of Assistance: 7: Independent Static Standing Balance Static Standing - Balance Support: During functional activity;Right upper extremity supported Static Standing - Level of Assistance: 6: Modified independent (Device/Increase time) Dynamic Standing Balance Dynamic Standing - Balance Support: During functional activity;Right upper extremity supported Dynamic Standing - Level of Assistance: 6: Modified independent (Device/Increase time) Functional Gait  Assessment  Gait assessed : Yes Gait Level Surface: Walks 20 ft in less than 7 sec but greater than 5.5 sec, uses assistive device, slower speed, mild gait deviations, or deviates 6-10 in outside of the 12 in walkway width. Change in Gait Speed: Able to change speed, demonstrates mild gait deviations, deviates 6-10 in outside of the 12 in walkway width, or no gait deviations, unable to achieve a major change in velocity, or uses a change in velocity, or uses an assistive device. Gait with Horizontal Head Turns: Performs head turns smoothly with slight change in gait velocity (eg, minor disruption to smooth gait path), deviates 6-10 in outside 12 in walkway width, or uses an assistive device. Gait with Vertical Head Turns: Performs task with slight change in gait velocity (eg, minor disruption to smooth gait path), deviates 6 - 10 in outside 12 in walkway width or uses assistive device Gait and Pivot Turn: Pivot turns safely in greater than 3 sec and stops with no loss of balance, or pivot turns safely within 3 sec and stops with mild imbalance, requires small steps to catch balance. Step Over Obstacle: Is able to step over one shoe box (4.5 in total height) without changing gait speed. No evidence of imbalance. Gait with Narrow Base of Support: Ambulates less than 4 steps heel to toe or cannot perform without assistance. (L LOB after 6 steps) Gait with Eyes Closed: Walks 20 ft, uses assistive device, slower speed, mild  gait deviations, deviates 6-10 in outside 12 in walkway width. Ambulates 20 ft in less than 9 sec but greater than 7 sec. Ambulating Backwards: Walks 20 ft, uses assistive device, slower speed, mild gait deviations, deviates 6-10 in outside 12 in walkway width. Steps: Alternating feet, must use rail. Total Score: 18 Extremity Assessment      RLE Assessment RLE Assessment: Within Functional Limits Active Range of Motion (AROM) Comments: WFL General Strength Comments: Grossly 4+/5 to 5/5 assessed in sitting LLE Assessment LLE Assessment: Exceptions to [facility] Active Range of Motion (AROM) Comments: [facility] General Strength Comments: assessed in sitting LLE Strength Left Hip Flexion: 4/5 Left Knee Flexion: 4/5 Left Knee Extension: 4/5 Left Ankle Dorsiflexion: 4-/5 Left Ankle Plantar Flexion: 4-/5    Tawana Scale , PT, DPT, NCS, CSRS 01/20/2021, 12:57 PM

## 2021-01-20 NOTE — Plan of Care (Signed)
  Problem: Sit to Stand Goal: LTG:  Patient will perform sit to stand with assistance level (PT) Description: LTG:  Patient will perform sit to stand with assistance level (PT) Outcome: Completed/Met   Problem: RH Balance Goal: LTG Patient will maintain dynamic sitting balance (PT) Description: LTG:  Patient will maintain dynamic sitting balance with assistance during mobility activities (PT) Outcome: Completed/Met Goal: LTG Patient will maintain dynamic standing balance (PT) Description: LTG:  Patient will maintain dynamic standing balance with assistance during mobility activities (PT) Outcome: Completed/Met   Problem: Sit to Stand Goal: LTG:  Patient will perform sit to stand with assistance level (PT) Description: LTG:  Patient will perform sit to stand with assistance level (PT) Outcome: Completed/Met   Problem: RH Bed Mobility Goal: LTG Patient will perform bed mobility with assist (PT) Description: LTG: Patient will perform bed mobility with assistance, with/without cues (PT). Outcome: Completed/Met   Problem: RH Bed to Chair Transfers Goal: LTG Patient will perform bed/chair transfers w/assist (PT) Description: LTG: Patient will perform bed to chair transfers with assistance (PT). Outcome: Completed/Met   Problem: RH Car Transfers Goal: LTG Patient will perform car transfers with assist (PT) Description: LTG: Patient will perform car transfers with assistance (PT). Outcome: Completed/Met   Problem: RH Ambulation Goal: LTG Patient will ambulate in controlled environment (PT) Description: LTG: Patient will ambulate in a controlled environment, # of feet with assistance (PT). Outcome: Completed/Met Goal: LTG Patient will ambulate in home environment (PT) Description: LTG: Patient will ambulate in home environment, # of feet with assistance (PT). Outcome: Completed/Met   Problem: RH Stairs Goal: LTG Patient will ambulate up and down stairs w/assist (PT) Description:  LTG: Patient will ambulate up and down # of stairs with assistance (PT) Outcome: Completed/Met

## 2021-01-20 NOTE — Progress Notes (Signed)
Occupational Therapy Discharge Summary  Patient Details  Name: Christopher [name] MRN: [id number] Date of Birth: 12/08/1965  Today's Date: 01/20/2021 OT Individual Time: 0800-0910 OT Individual Time Calculation (min): 70 min   Session Note:  Pt completed donning shoes EOB to start session with setup assist.  He then ambulated down to the therapy gym with supervision using the single point cane for support while holding a cup with lid in the left hand.  He was able to complete Box and Blocks Test with completion of 27 blocks compared to 6 blocks with initial test last week.  Then had him complete Nine Hole Peg Test in 45 seconds with the right and 2:21 with the left.  Educated pt on activities to be completed daily for increased coordination at home as well as completion of UE strengthening for shoulder flexion, elbow flexion,  and wrist flexion.  Handouts were given for reference as well as therapist issuing medium resistance therapy putty and putty exercises.  He was able to return demonstrate all exercises for 1-2 reps.  Ambulated back to the room to complete session with supervision and use of the single point cane.  He was left in the bed with call button and phone in reach and safety alarm in place.    Patient has met 13 of 13 long term goals due to improved activity tolerance, improved balance, postural control, functional use of  LEFT upper and LEFT lower extremity, and improved coordination.  Patient to discharge at overall Supervision level.  Patient's care partner is independent to provide the necessary physical assistance at discharge.    Reasons goals not met: NA  Recommendation:  Patient will benefit from ongoing skilled OT services in outpatient setting to continue to advance functional skills in the area of BADL, Vocation, and Reduce care partner burden.  Pt continues to demonstrate mild LUE and LLE hemiparesis with decreased coordination and decreased balance.  Feel he will benefit from  continued outpatient OT to continue progression toward independent level, however with pt not having insurance, this is likely not possible.  Have issued intensive HEP for continued strengthening and coordination.    Equipment: No equipment provided  Reasons for discharge: treatment goals met and discharge from hospital  Patient/family agrees with progress made and goals achieved: Yes  OT Discharge Precautions/Restrictions  Restrictions Weight Bearing Restrictions: No   Pain Pain Assessment Pain Scale: 0-10 Pain Score: 0-No pain ADL ADL Eating: Independent Where Assessed-Eating: Chair Grooming: Modified independent Where Assessed-Grooming: Standing at sink Upper Body Bathing: Supervision/safety Where Assessed-Upper Body Bathing: Chair Lower Body Bathing: Supervision/safety Where Assessed-Lower Body Bathing: Shower, Chair Upper Body Dressing: Supervision/safety, Modified independent (Device) Where Assessed-Upper Body Dressing: Edge of bed Lower Body Dressing: Supervision/safety Where Assessed-Lower Body Dressing: Chair Toileting: Supervision/safety Where Assessed-Toileting: Glass blower/designer: Close supervision Toilet Transfer Method: Counselling psychologist: Raised toilet seat Tub/Shower Transfer: Close supervison Clinical cytogeneticist Method: Optometrist: Civil engineer, contracting with back Social research officer, government: Close supervision Social research officer, government Method: Heritage manager: Civil engineer, contracting with back Community education officer: Within Financial controller Praxis: Intact Cognition Orientation Level: Oriented X4 Sensation Coordination 9 Hole Peg Test: righ equal to 41 seconds, left 2:21 Motor    Mobility     Trunk/Postural Assessment  Cervical Assessment Cervical Assessment: Within Functional Limits Thoracic Assessment Thoracic Assessment: Exceptions to [facility] (slight thoracic rounding) Lumbar  Assessment Lumbar Assessment: Exceptions to WFL (slight posterior pelvic tilt)  Balance Balance Balance Assessed: Yes  Static Sitting Balance Static Sitting - Balance Support: Feet supported Static Sitting - Level of Assistance: 7: Independent Dynamic Sitting Balance Dynamic Sitting - Balance Support: During functional activity Dynamic Sitting - Level of Assistance: 7: Independent Static Standing Balance Static Standing - Balance Support: During functional activity Static Standing - Level of Assistance: 6: Modified independent (Device/Increase time) Dynamic Standing Balance Dynamic Standing - Level of Assistance: 5: Stand by assistance Extremity/Trunk Assessment RUE Assessment RUE Assessment: Within Functional Limits LUE Assessment LUE Assessment: Exceptions to [facility] Passive Range of Motion (PROM) Comments: WFLs Active Range of Motion (AROM) Comments: Shoulder flexion 0-150 degrees with decreased ability to maintain elbow extension throughout movement secondary to slight synergy pattern.  Brunnstrum stage V-VI.  All other joints gross AROM WFLS. General Strength Comments: 3-5/ shoulder flexion 3+/5 for elbow flexion/extension and grip.  Decreased FM coordination with 9 hole peg test at 2:21.  Uses the UE functionally at a diminshed level with selfcare tasks currently.   Trinidad Petron OTR/L 01/20/2021, 12:35 PM

## 2021-01-20 NOTE — Progress Notes (Signed)
Patient ID: Christopher [name], male   DOB: November 13, 1965, 55 y.o.   MRN: [id number]  Please call pt sister [name] [phone] for follow up at d/c.  [place], [place] [phone]

## 2021-01-20 NOTE — Plan of Care (Signed)
  Problem: RH SAFETY Goal: RH STG ADHERE TO SAFETY PRECAUTIONS W/ASSISTANCE/DEVICE Description: STG Adhere to Safety Precautions With cues Assistance/Device. Outcome: Progressing   Problem: RH KNOWLEDGE DEFICIT Goal: RH STG INCREASE KNOWLEDGE OF DIABETES Description: Patient will be able to manage prediabetes with dietary modifications using handouts and educational resources independently Outcome: Progressing   Problem: RH KNOWLEDGE DEFICIT Goal: RH STG INCREASE KNOWLEDGE OF HYPERTENSION Description: Patient will be able to manage HTN  with medications and  dietary modifications using handouts and educational resources independently Outcome: Progressing   Problem: RH KNOWLEDGE DEFICIT Goal: RH STG INCREASE KNOWLEGDE OF HYPERLIPIDEMIA Description: Patient will be able to manage HLD with medications and dietary modifications using handouts and educational resources independently Outcome: Progressing

## 2021-01-20 NOTE — Progress Notes (Signed)
Patient ID: Christopher [name], male   DOB: 1965/11/27, 55 y.o.   MRN: [id number]  Pt OP referral faxed to [facility]  [name], [place] [phone]

## 2021-01-20 NOTE — Progress Notes (Signed)
PROGRESS NOTE   Subjective/Complaints: Appreciate SW note , pt plans to go home with family (sister) until he is more independent  ROS: Patient denies CP, SOB N/V/D  Objective:   No results found. No results for input(s): WBC, HGB, HCT, PLT in the last 72 hours.  Recent Labs    01/20/21 0503  NA 134*  K 4.3  CL 100  CO2 26  GLUCOSE 95  BUN 19  CREATININE 1.07  CALCIUM 9.0     Intake/Output Summary (Last 24 hours) at 01/20/2021 0750 Last data filed at 01/20/2021 0700 Gross per 24 hour  Intake 880 ml  Output 1750 ml  Net -870 ml         Physical Exam: Vital Signs Blood pressure (!) 157/76, pulse 65, temperature 98.2 F (36.8 C), temperature source Oral, resp. rate 18, height 5\' 9"  (1.753 m), weight 52.6 kg, SpO2 98 %.  General: No acute distress Mood and affect are appropriate Heart: Regular rate and rhythm no rubs murmurs or extra sounds Lungs: Clear to auscultation, breathing unlabored, no rales or wheezes Abdomen: Positive bowel sounds, soft nontender to palpation, nondistended Extremities: No clubbing, cyanosis, or edema  Musculoskeletal:     Cervical back: Normal range of motion and neck supple.     right knee tender with AROM/PROM. No instability, no effusion or warmth, large callus below patella Skin:    General: Skin is warm and dry.     Neurological:     Mental Status: He is alert.     Comments: Alert and oriented to person and place Motor: RUE/RLE: 5/5 proximal distally in LUE/LE: 4/5 proximal distal Dysarthria       Assessment/Plan: 1. Functional deficits which require 3+ hours per day of interdisciplinary therapy in a comprehensive inpatient rehab setting. Physiatrist is providing close team supervision and 24 hour management of active medical problems listed below. Physiatrist and rehab team continue to assess barriers to discharge/monitor patient progress toward functional and medical  goals  Care Tool:  Bathing    Body parts bathed by patient: Left arm, Chest, Abdomen, Front perineal area, Buttocks, Right upper leg, Left upper leg, Face, Left lower leg, Right lower leg, Right arm         Bathing assist Assist Level: Supervision/Verbal cueing     Upper Body Dressing/Undressing Upper body dressing   What is the patient wearing?: Pull over shirt    Upper body assist Assist Level: Set up assist    Lower Body Dressing/Undressing Lower body dressing      What is the patient wearing?: Pants     Lower body assist Assist for lower body dressing: Supervision/Verbal cueing     Toileting Toileting    Toileting assist Assist for toileting: Minimal Assistance - Patient > 75% Assistive Device Comment: urinal   Transfers Chair/bed transfer  Transfers assist     Chair/bed transfer assist level: Supervision/Verbal cueing     Locomotion Ambulation   Ambulation assist      Assist level: Supervision/Verbal cueing Assistive device: Cane-straight Max distance: 1,011ft   Walk 10 feet activity   Assist     Assist level: Contact Guard/Touching assist Assistive device: No Device  Walk 50 feet activity   Assist    Assist level: Contact Guard/Touching assist Assistive device: No Device    Walk 150 feet activity   Assist    Assist level: Contact Guard/Touching assist Assistive device: No Device    Walk 10 feet on uneven surface  activity   Assist     Assist level: Contact Guard/Touching assist Assistive device: Other (comment) (None)   Wheelchair     Assist Is the patient using a wheelchair?: No             Wheelchair 50 feet with 2 turns activity    Assist            Wheelchair 150 feet activity     Assist          Blood pressure (!) 157/76, pulse 65, temperature 98.2 F (36.8 C), temperature source Oral, resp. rate 18, height 5\' 9"  (1.753 m), weight 52.6 kg, SpO2 98 %.  Medical Problem List  and Plan: 1.  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             -patient may shower             -ELOS/Goals: 9/16           -Continue CIR therapies including PT, OT,-  2.  Impaired mobility: -DVT/anticoagulation:  Pharmaceutical: continue Lovenox             -antiplatelet therapy: DAPT X 3 weeks followed by ASA alone.  3. Pain Management: mild right knee pain, likely mild OA.  -continue tylenol  -ice prn, observe 4. Mood: LCSW to follow for evaluation and support.              -antipsychotic agents: N/A 5. Neuropsych: This patient is not capable of making decisions on his own behalf. 6. Skin/Wound Care: Routine pressure relief measures.  7. Fluids/Electrolytes/Nutrition: Monitor I/Os 8. HTN: Monitor BP TID w/permissive HTN for 5-7 days to  normalize OK for <220/120 (avoid hypoperfusion)              Vitals:   01/19/21 1926 01/20/21 0508  BP: (!) 151/78 (!) 157/76  Pulse: 65 65  Resp: 18 18  Temp: 98 F (36.7 C) 98.2 F (36.8 C)  SpO2: 95% 98%  Sys BP stays up will start ACE-I lisinopril 5mg  daily  9. Hyperlipidemia: Now on low dose statin- Continue Lipitor 20 mg/day 10. Polysubstance abuse:              consult neuropsych , + Cocaine and THC on drug screen 9/5 11. Leucocytosis: Resolved 12. Hyponatremia:             stable, monitor weekly 13. Mild anorexia BMI 17.12- provide dietary education.   14. Elevated BUN: placed nursing order to encourage 6-8 glasses of water per day  -f/u labs Monday  LOS: 8 days A FACE TO FACE EVALUATION WAS PERFORMED  11/5 01/20/2021, 7:50 AM

## 2021-01-20 NOTE — Progress Notes (Signed)
Inpatient Rehabilitation Care Coordinator Discharge Note   Patient Details  Name: Christopher [name] MRN: [id number] Date of Birth: 12/04/1965   Discharge location: Sister's home  Length of Stay: 9 Days  Discharge activity level: MOD I/SUP  Home/community participation: sister and friends able to provide assistance with roles and task  Patient response WE:[id number] Literacy - How often do you need to have someone help you when you read instructions, pamphlets, or other written material from your doctor or pharmacy?: Sometimes  Patient response RC:VELFYB Isolation - How often do you feel lonely or isolated from those around you?: Never  Services provided included: MD, RD, PT, SLP, OT, RN, CM, TR, Pharmacy, Neuropsych, SW  Financial Services:  Field seismologist Utilized: Other (Comment) (uninsured)    Choices offered to/list presented to: n/a  Follow-up services arranged:  Outpatient    Outpatient Servicies: Neuro OP      Patient response to transportation need: Is the patient able to respond to transportation needs?: Yes In the past 12 months, has lack of transportation kept you from medical appointments or from getting medications?: No In the past 12 months, has lack of transportation kept you from meetings, work, or from getting things needed for daily living?: No    Comments (or additional information):  Patient/Family verbalized understanding of follow-up arrangements:  Yes  Individual responsible for coordination of the follow-up plan: pt sister [name] [phone]  Confirmed correct DME delivered: Andria Rhein 01/20/2021    Andria Rhein

## 2021-01-20 NOTE — Progress Notes (Signed)
Patient ID: Christopher [name], male   DOB: May 07, 1966, 55 y.o.   MRN: [id number]  Patient MATCH entered into system:  [id number] Effective: 01/20/2021 Term: 01/27/2021  [name], [place] [phone]

## 2021-01-20 NOTE — Discharge Instructions (Addendum)
Inpatient Rehab Discharge Instructions  Christopher [name] Discharge date and time: 01/21/21   Activities/Precautions/ Functional Status: Activity: no lifting, driving, or strenuous exercise till cleared by MD Diet: cardiac diet Wound Care: none needed  Functional status:  ___ No restrictions     ___ Walk up steps independently _X__ 24/7 supervision/assistance   ___ Walk up steps with assistance ___ Intermittent supervision/assistance  ___ Bathe/dress independently _X__ Walk with cane      ___ Bathe/dress with assistance ___ Walk Independently    ___ Shower independently _X__ Walk with assistance    _X__ Shower with assistance _X__ No alcohol     ___ Return to work/school ________   Special Instructions: No alcohol, marijuana or cocaine.  Will need follow up with MD to get clearance to return to work.    COMMUNITY REFERRALS UPON DISCHARGE:    Outpatient: PT                 Agency: [facility] Phone: [phone]              Appointment Date/Time: TBD   STROKE/TIA DISCHARGE INSTRUCTIONS SMOKING Cigarette smoking nearly doubles your risk of having a stroke & is the single most alterable risk factor  If you smoke or have smoked in the last 12 months, you are advised to quit smoking for your health. Most of the excess cardiovascular risk related to smoking disappears within a year of stopping. Ask you doctor about anti-smoking medications [place] Quit Line: 1-800-QUIT NOW Free Smoking Cessation Classes [phone]  CHOLESTEROL Know your levels; limit fat & cholesterol in your diet  Lipid Panel     Component Value Date/Time   CHOL 102 01/11/2021 0109   TRIG 86 01/11/2021 0109   HDL 25 (L) 01/11/2021 0109   CHOLHDL 4.1 01/11/2021 0109   VLDL 17 01/11/2021 0109   LDLCALC 60 01/11/2021 0109     Many patients benefit from treatment even if their cholesterol is at goal. Goal: Total Cholesterol (CHOL) less than 160 Goal:  Triglycerides (TRIG) less than 150 Goal:  HDL  greater than 40 Goal:  LDL (LDLCALC) less than 100   BLOOD PRESSURE American Stroke Association blood pressure target is less that 120/80 mm/Hg  Your discharge blood pressure is:  BP: 117/66 Monitor your blood pressure Limit your salt and alcohol intake Many individuals will require more than one medication for high blood pressure  DIABETES (A1c is a blood sugar average for last 3 months) Goal HGBA1c is under 7% (HBGA1c is blood sugar average for last 3 months)  Diabetes: Pre-diabetes    Lab Results  Component Value Date   HGBA1C 5.8 (H) 01/11/2021    Your HGBA1c can be lowered with medications, healthy diet, and exercise. Check your blood sugar as directed by your physician Call your physician if you experience unexplained or low blood sugars.  PHYSICAL ACTIVITY/REHABILITATION Goal is 30 minutes at least 4 days per week  Activity: Increase activity slowly, and No driving, Therapies:  see above Return to work: to be decided on follow up. Activity decreases your risk of heart attack and stroke and makes your heart stronger.  It helps control your weight and blood pressure; helps you relax and can improve your mood. Participate in a regular exercise program. Talk with your doctor about the best form of exercise for you (dancing, walking, swimming, cycling).  DIET/WEIGHT Goal is to maintain a healthy weight  Your discharge diet is:  Diet Order  Diet Heart Room service appropriate? Yes; Fluid consistency: Thin  Diet effective now                   liquids Your height is:  Height: 5\' 9"  (175.3 cm) Your current weight is: Weight: 52.6 kg Your Body Mass Index (BMI) is:  BMI (Calculated): 17.11 Following the type of diet specifically designed for you will help prevent another stroke. You are below goal weight    Your goal Body Mass Index (BMI) is 19-24. Healthy food habits can help reduce 3 risk factors for stroke:  High cholesterol, hypertension, and excess weight.   RESOURCES Stroke/Support Group:  Call [phone]   STROKE EDUCATION PROVIDED/REVIEWED AND GIVEN TO PATIENT Stroke warning signs and symptoms How to activate emergency medical system (call 911). Medications prescribed at discharge. Need for follow-up after discharge. Personal risk factors for stroke. Pneumonia vaccine given:  Flu vaccine given:  My questions have been answered, the writing is legible, and I understand these instructions.  I will adhere to these goals & educational materials that have been provided to me after my discharge from the hospital.      My questions have been answered and I understand these instructions. I will adhere to these goals and the provided educational materials after my discharge from the hospital.  Patient/Caregiver Signature _______________________________ Date __________  Clinician Signature _______________________________________ Date __________  Please bring this form and your medication list with you to all your follow-up doctor's appointments.

## 2021-01-20 NOTE — Progress Notes (Signed)
Occupational Therapy Session Note  Patient Details  Name: Christopher [name] MRN: [id number] Date of Birth: Jun 20, 1965  Today's Date: 01/20/2021 OT Individual Time: 1515-1600 OT Individual Time Calculation (min): 45 min    Short Term Goals: Week 1:  OT Short Term Goal 1 (Week 1): Pt will complete LB selfcare sit to stand with min guard assist. OT Short Term Goal 2 (Week 1): Pt will complete toilet transfers with min guard using LRAD. OT Short Term Goal 3 (Week 1): Pt will use the LUE at a diminished level to donn clothing with supervision overall when threading pants, socks, shirt.   Skilled Therapeutic Interventions/Progress Updates:    Pt received supine in bed, c/o pain in abdomen from earlier shot with ice pack on. Agreeable to OT session. Overall, pt performed functional mobility tasks with close (s) with single-point cane. CGA was provided for activities requiring weight shift and balance challenges. Pt found path to ortho gym on floor below with min cuing. Worked on standing balance on compliant surface and FM activity B UE. Pt demonstrated bimanual skills to fasten nuts/bolts, requiring prompting to facilitate pincer grasp. Task graded up to work on proprioception with vision occluded (pt unable to see L hand turning nut onto bolt). Worked on functional mobility, balance, and cognitive elements walking forwards/backwards bouncing ball for dual task processing. Pt had to name different cars while performing reciprocal movement in UE. Task graded up to sequence names of animals by alphabet while walking backwards/forwards. Pt did well during activity, needing pauses with both walking and/or bouncing ball for additional processing time. LOB self-corrected several times by pt. Demonstrated ability to weight shift forward to pick up ball while walking forward. Cognitive flexibility demonstrated while holding a conversation, bouncing ball and path finding back to room. Needed cues for redirection to  initiate activity. Pt left supine in bed, needs met, call bell nearby, bed alarm on.  Therapy Documentation Precautions:  Precautions Precautions: Fall Precaution Comments: L hemiparesis Restrictions Weight Bearing Restrictions: No   Therapy/Group: Individual Therapy  Tyesha Joffe 01/20/2021, 5:24 PM

## 2021-01-20 NOTE — Progress Notes (Signed)
Patient ID: Christopher [name], male   DOB: December 06, 1965, 55 y.o.   MRN: [id number]  New Patient Visit with [name], NP Monday April 11, 2021 9:40 AM at [facility] at [facility]  [address], [address] [place] [address]  [place], [place] [phone]

## 2021-01-21 ENCOUNTER — Other Ambulatory Visit (HOSPITAL_COMMUNITY): Payer: Self-pay

## 2021-01-21 DIAGNOSIS — I639 Cerebral infarction, unspecified: Secondary | ICD-10-CM | POA: Diagnosis not present

## 2021-01-21 MED ORDER — CLOPIDOGREL BISULFATE 75 MG PO TABS
75.0000 mg | ORAL_TABLET | Freq: Every day | ORAL | 0 refills | Status: DC
  Filled 2021-01-21: qty 11, 11d supply, fill #0

## 2021-01-21 MED ORDER — CLOPIDOGREL BISULFATE 75 MG PO TABS: 75.0000 mg | ORAL_TABLET | Freq: Every day | ORAL | 0 refills | Status: DC

## 2021-01-21 MED ORDER — CLOPIDOGREL BISULFATE 75 MG PO TABS
75.0000 mg | ORAL_TABLET | Freq: Every day | ORAL | 0 refills | Status: DC
  Filled 2021-01-21: qty 7, 7d supply, fill #0

## 2021-01-21 MED ORDER — ACETAMINOPHEN 325 MG PO TABS: 325.0000 mg | ORAL_TABLET | ORAL | Status: DC | PRN

## 2021-01-21 MED ORDER — ATORVASTATIN CALCIUM 20 MG PO TABS
20.0000 mg | ORAL_TABLET | Freq: Every day | ORAL | 0 refills | Status: DC
Start: 2021-01-21 — End: 2021-01-21

## 2021-01-21 MED ORDER — ATORVASTATIN CALCIUM 20 MG PO TABS
20.0000 mg | ORAL_TABLET | Freq: Every day | ORAL | 0 refills | Status: DC
  Filled 2021-01-21: qty 30, 30d supply, fill #0

## 2021-01-21 MED ORDER — ASPIRIN 81 MG PO CHEW: 81.0000 mg | CHEWABLE_TABLET | Freq: Every day | ORAL | 0 refills | Status: DC

## 2021-01-21 MED ORDER — ASPIRIN 81 MG PO CHEW
81.0000 mg | CHEWABLE_TABLET | Freq: Every day | ORAL | 0 refills | Status: DC
Start: 2021-01-21 — End: 2021-02-17
  Filled 2021-01-21: qty 30, 30d supply, fill #0

## 2021-01-21 MED ORDER — LISINOPRIL 5 MG PO TABS: 5.0000 mg | ORAL_TABLET | Freq: Every day | ORAL | 0 refills | Status: DC

## 2021-01-21 MED ORDER — LISINOPRIL 5 MG PO TABS
5.0000 mg | ORAL_TABLET | Freq: Every day | ORAL | 0 refills | Status: DC
  Filled 2021-01-21: qty 30, 30d supply, fill #0

## 2021-01-21 NOTE — Progress Notes (Signed)
PROGRESS NOTE   Subjective/Complaints: No new issues overnite , discussed substance abuse, (cocaine and smoking) as it pertains to stroke risk. Pt states he plans to quit Also discussed no driving or work, will reassess in clinic ROS: Patient denies CP, SOB N/V/D  Objective:   No results found. No results for input(s): WBC, HGB, HCT, PLT in the last 72 hours.  Recent Labs    01/20/21 0503  NA 134*  K 4.3  CL 100  CO2 26  GLUCOSE 95  BUN 19  CREATININE 1.07  CALCIUM 9.0     Intake/Output Summary (Last 24 hours) at 01/21/2021 0725 Last data filed at 01/21/2021 0049 Gross per 24 hour  Intake 520 ml  Output 800 ml  Net -280 ml         Physical Exam: Vital Signs Blood pressure 117/66, pulse 64, temperature 98.3 F (36.8 C), temperature source Oral, resp. rate 18, height 5\' 9"  (1.753 m), weight 52.6 kg, SpO2 95 %.   General: No acute distress Mood and affect are appropriate Heart: Regular rate and rhythm no rubs murmurs or extra sounds Lungs: Clear to auscultation, breathing unlabored, no rales or wheezes Abdomen: Positive bowel sounds, soft nontender to palpation, nondistended Extremities: No clubbing, cyanosis, or edema Skin: No evidence of breakdown, no evidence of rash    Neurological:     Mental Status: He is alert.     Comments: Alert and oriented to person and place Motor: RUE/RLE: 5/5 proximal distally in LUE/LE: 4/5 proximal distal Dysarthria       Assessment/Plan: 1. Functional deficits which require 3+ hours per day of interdisciplinary therapy in a comprehensive inpatient rehab setting. Physiatrist is providing close team supervision and 24 hour management of active medical problems listed below. Physiatrist and rehab team continue to assess barriers to discharge/monitor patient progress toward functional and medical goals  Care Tool:  Bathing    Body parts bathed by patient: Left arm,  Chest, Abdomen, Front perineal area, Buttocks, Right upper leg, Left upper leg, Face, Left lower leg, Right lower leg, Right arm         Bathing assist Assist Level: Supervision/Verbal cueing     Upper Body Dressing/Undressing Upper body dressing   What is the patient wearing?: Pull over shirt    Upper body assist Assist Level: Independent    Lower Body Dressing/Undressing Lower body dressing      What is the patient wearing?: Pants     Lower body assist Assist for lower body dressing: Supervision/Verbal cueing     Toileting Toileting    Toileting assist Assist for toileting: Supervision/Verbal cueing Assistive Device Comment: urinal   Transfers Chair/bed transfer  Transfers assist     Chair/bed transfer assist level: Supervision/Verbal cueing Chair/bed transfer assistive device:   Ambulation assist      Assist level: Supervision/Verbal cueing Assistive device: Cane-straight Max distance: 1543ft   Walk 10 feet activity   Assist     Assist level: Supervision/Verbal cueing Assistive device: Cane-straight   Walk 50 feet activity   Assist    Assist level: Supervision/Verbal cueing Assistive device: Cane-straight    Walk 150 feet activity  Assist    Assist level: Supervision/Verbal cueing Assistive device: Cane-straight    Walk 10 feet on uneven surface  activity   Assist     Assist level: Supervision/Verbal cueing Assistive device: Cane-straight   Wheelchair     Assist Is the patient using a wheelchair?: No             Wheelchair 50 feet with 2 turns activity    Assist            Wheelchair 150 feet activity     Assist          Blood pressure 117/66, pulse 64, temperature 98.3 F (36.8 C), temperature source Oral, resp. rate 18, height 5\' 9"  (1.753 m), weight 52.6 kg, SpO2 95 %.  Medical Problem List and Plan: 1.  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          d/c home today  2.  Impaired mobility: -DVT/anticoagulation:  Pharmaceutical: continue Lovenox             -antiplatelet therapy: DAPT X 3 weeks followed by ASA alone.  3. Pain Management: mild right knee pain, likely mild OA.  -continue tylenol  -ice prn, observe 4. Mood: LCSW to follow for evaluation and support.              -antipsychotic agents: N/A 5. Neuropsych: This patient is not capable of making decisions on his own behalf. 6. Skin/Wound Care: Routine pressure relief measures.  7. Fluids/Electrolytes/Nutrition: Monitor I/Os 8. HTN: Monitor BP TID w/permissive HTN for 5-7 days to  normalize OK for <220/120 (avoid hypoperfusion)              Vitals:   01/20/21 1926 01/21/21 0503  BP: 112/63 117/66  Pulse: 79 64  Resp: 18 18  Temp: 98.9 F (37.2 C) 98.3 F (36.8 C)  SpO2: 98% 95%  Cont lisinopril 5mg  daily  9. Hyperlipidemia: Now on low dose statin- Continue Lipitor 20 mg/day 10. Polysubstance abuse:              consult neuropsych , + Cocaine and THC on drug screen 9/5- discussed stroke risk and cocaine use- need for cessation   11. Leucocytosis: Resolved 12. Hyponatremia:             stable, monitor weekly 13. Mild anorexia BMI 17.12- provide dietary education.   14. Elevated BUN: placed nursing order to encourage 6-8 glasses of water per day  -f/u labs Monday  LOS: 9 days A FACE TO FACE EVALUATION WAS PERFORMED  11/5 01/21/2021, 7:25 AM

## 2021-01-21 NOTE — Discharge Summary (Signed)
Physician Discharge Summary  Patient ID: Christopher [name] MRN: [id number] DOB/AGE: January 12, 1966 55 y.o.  Admit date: 01/12/2021 Discharge date: 01/21/2021  Discharge Diagnoses:  Principal Problem:   Acute CVA (cerebrovascular accident) [facility]) Active Problems:   Hypertension   Hyponatremia   Polysubstance abuse (HCC)   Dyslipidemia   Discharged Condition: good  Significant Diagnostic Studies: N/A   Labs:  Basic Metabolic Panel: BMP Latest Ref Rng & Units 01/20/2021 01/13/2021 01/12/2021  Glucose 70 - 99 mg/dL 95 277(A) 128(N)  BUN 6 - 20 mg/dL 19 86(V) 14  Creatinine 0.61 - 1.24 mg/dL 6.72 0.94 7.09  Sodium 135 - 145 mmol/L 134(L) 134(L) 134(L)  Potassium 3.5 - 5.1 mmol/L 4.3 4.3 4.0  Chloride 98 - 111 mmol/L 100 103 103  CO2 22 - 32 mmol/L 26 24 25   Calcium 8.9 - 10.3 mg/dL 9.0 9.3 9.2     CBC: CBC Latest Ref Rng & Units 01/17/2021 01/14/2021 01/13/2021  WBC 4.0 - 10.5 K/uL 7.3 9.3 12.2(H)  Hemoglobin 13.0 - 17.0 g/dL 03/15/2021 62.8 36.6  Hematocrit 39.0 - 52.0 % 45.5 47.5 45.4  Platelets 150 - 400 K/uL 287 272 251     CBG: No results for input(s): GLUCAP in the last 168 hours.  Brief HPI:   Christopher [name] is a 55 y.o. LH-male with history of tobacco, marijuana and cocaine use otherwise in good health who was admitted on 01/10/2021 with 1 day history of slurred speech and left hemiparesis.  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  UDS was positive for cocaine and THC.  Dr. 03/12/2021 felt the stroke was likely due to cocaine use and recommended DAPT x3 weeks followed by aspirin alone.  Therapy evaluations done revealing mild dysarthria with unsteady gait and balance deficits.  CIR was recommended due to functional decline   Hospital Course: Christopher [name] was admitted  to rehab 01/12/2021 for inpatient therapies to consist of PT  and OT at least three hours five days a week. Past admission physiatrist, therapy team and rehab RN have worked together to provide customized collaborative inpatient rehab.  He was maintained on aspirin and Plavix during his stay and has been tolerating this without side effects.  Serial check of CBC shows H&H and platelets to be WNL and reactive leukocytosis has resolved.  His blood pressures were monitored on TID basis and and continued to be labile therefore lisinopril was added for better control.    Follow up BMET showed evidence of dehydration and he was encouraged to increase fluid intake with resolution of AKI.  Mild hyponatremia stable. He continues on low-dose statin for hyperlipidemia.  He has been educated regarding cessation of polysubstance abuse and as well as importance of increasing fluid intake. He has had issues with mild right knee pain felt to be due to mild OA.  Tylenol and ice has been used to help with pain control.  He has made steady progress during his rehab stay and currently requires supervision for safety.  He will continue to receive outpatient PT at [facility] neuro rehab after discharge.   Rehab course: During patient's stay in rehab weekly team conferences were held to monitor patient's progress, set goals and discuss barriers to discharge. At admission, patient required min assist with mobility and ADL tasks. Speech therapy  evaluations revealed slums score 30 out of 30 with speech 100% intelligible with ability to verbalize wants and needs.  Speech therapist reviewed speech intelligibility strategies were reviewed  with patient and speech therapy not warranted for follow-up during the stay. He   has had improvement in activity tolerance, balance, postural control as well as ability to compensate for deficits. He has had improvement in functional use LUE  and LLE as well as improvement in awareness.  He is able to complete  ADL tasks with supervision. He requires supervision for transfers and is able to ambulate 1500 feet with straight cane and verbal cues.  Family education has been completed.    Disposition: Home  Diet: Heart healthy  Special Instructions: No driving, or return to work till follow up with MD.  Discharge Instructions     Ambulatory referral to Neurology   Complete by: As directed    An appointment is requested in approximately: 3-4 weeks   Ambulatory referral to Physical Medicine Rehab   Complete by: As directed    Will need follow up appt--does not have PCP appt till Dec and will need refills on meds. Has one month filled by [facility]      Allergies as of 01/21/2021   No Known Allergies      Medication List     STOP taking these medications    senna-docusate 8.6-50 MG tablet Commonly known as: Senokot-S       TAKE these medications    acetaminophen 325 MG tablet Commonly known as: TYLENOL Take 1-2 tablets (325-650 mg total) by mouth every 4 (four) hours as needed for mild pain. What changed:  how much to take reasons to take this   Aspirin Low Dose 81 MG chewable tablet Generic drug: aspirin Chew 1 tablet (81 mg total) by mouth daily.   atorvastatin 20 MG tablet Commonly known as: LIPITOR Take 1 tablet (20 mg total) by mouth daily.   clopidogrel 75 MG tablet Commonly known as: PLAVIX Take 1 tablet (75 mg total) by mouth daily.   lisinopril 5 MG tablet Commonly known as: ZESTRIL Take 1 tablet (5 mg total) by mouth daily.        Follow-up Information     [name], NP Follow up.   Specialty: Nurse Practitioner Why: YOU NEED TO KEEP THIS APPOINTMENT Contact information: [address] [place] [address] [phone]         Erick Colace, MD Follow up.   Specialty: Physical Medicine and Rehabilitation Why: office will call you with follow up appt Contact information: [address] [place]  [address] [phone]         [facility]. Call.   Why: for appointment if you have not heard from them in a week Contact information: [address]     [address] [place] [address] [phone]                Signed: Jacquelynn Cree 01/24/2021, 4:45 PM

## 2021-01-21 NOTE — Progress Notes (Signed)
INPATIENT REHABILITATION DISCHARGE NOTE   Discharge instructions by: Delle Reining, PA  Verbalized understanding: by patient  Skin care/Wound care: no wounds  Pain: none  IV's: none  Tubes/Drains: none  Safety instructions: fall precautions   Patient belongings: sent with patient  Discharged to: home  Discharged via: car   Notes:

## 2021-01-24 ENCOUNTER — Telehealth: Payer: Self-pay

## 2021-01-24 DIAGNOSIS — E785 Hyperlipidemia, unspecified: Secondary | ICD-10-CM

## 2021-01-24 NOTE — Telephone Encounter (Signed)
Transition Care Management Unsuccessful Follow-up Telephone Call  Date of discharge and from where:  [facility] on 01/21/2021  Attempts:  1st Attempt  Reason for unsuccessful TCM follow-up call:  Left voice message to reach back this nurse or Erskine Squibb RN CM.

## 2021-01-25 ENCOUNTER — Telehealth: Payer: Self-pay

## 2021-01-25 NOTE — Telephone Encounter (Signed)
Transition Care Management Follow-up Telephone Call Date of discharge and from where: 01/21/2021, [facility]  How have you been since you were released from the hospital? He said he is blessed, no concerns at this time  Any questions or concerns? No  Items Reviewed: Did the pt receive and understand the discharge instructions provided? Yes  - but he said that he needs to review them again.  Medications obtained and verified? Yes - he said he has all medications and did not have any questions about the med regime. He explained that his sister, sets up his medications for him to insure he takes what he is supposed to take.  Other? No  Any new allergies since your discharge? No  Dietary orders reviewed? Yes Do you have support at home? Yes - lives with his sister and brother in law.   Home Care and Equipment/Supplies: Were home health services ordered? no If so, what is the name of the agency? N/a  Has the agency set up a time to come to the patient's home? not applicable Were any new equipment or medical supplies ordered?  No What is the name of the medical supply agency? N/a Were you able to get the supplies/equipment? not applicable Do you have any questions related to the use of the equipment or supplies? No  Functional Questionnaire: (I = Independent and D = Dependent) ADLs: uses cane with ambulation. Independent with personal care.   His sister assists with medication management,    Follow up appointments reviewed:  PCP Hospital f/u appt confirmed? Yes  Scheduled to see Ricky Stabs, NP on 02/15/2021 @ 1040. He will then need to establish care. Specialist Hospital f/u appt confirmed? Yes  Scheduled with [facility] on 02/15/2021 @ 1040. At patient's request, this information was also provided to his brother in law, Rocky Link.  Are transportation arrangements needed? No  If their condition worsens, is the pt aware to call PCP or go to the Emergency Dept.? Yes Was the patient provided with  contact information for the PCP's office or ED? Yes Was to pt encouraged to call back with questions or concerns? Yes

## 2021-02-09 ENCOUNTER — Other Ambulatory Visit: Payer: Self-pay

## 2021-02-09 ENCOUNTER — Encounter: Payer: Medicaid Other | Attending: Registered Nurse | Admitting: Registered Nurse

## 2021-02-09 VITALS — Ht 69.0 in | Wt 125.0 lb

## 2021-02-09 DIAGNOSIS — F1910 Other psychoactive substance abuse, uncomplicated: Secondary | ICD-10-CM

## 2021-02-09 DIAGNOSIS — E785 Hyperlipidemia, unspecified: Secondary | ICD-10-CM | POA: Diagnosis not present

## 2021-02-09 DIAGNOSIS — I10 Essential (primary) hypertension: Secondary | ICD-10-CM | POA: Diagnosis not present

## 2021-02-09 DIAGNOSIS — I639 Cerebral infarction, unspecified: Secondary | ICD-10-CM | POA: Diagnosis not present

## 2021-02-09 NOTE — Progress Notes (Signed)
Subjective:    Patient ID: Christopher [name], male    DOB: 05-06-1966, 55 y.o.   MRN: [id number]  HPI: Christopher [name] is a 55 y.o. male who was scheduled for HFU appointment of his Acute CVA( Cerebrovascular Accident), Essential Hypertension, Dyslipidemia and Polysubstance abuse. He called office reporting he had head cold symptoms, his visit was changed to a Telephone Visit. Christopher [name] agrees with telephone visit and verbalizes understanding.   Christopher [name] was brought to [facility] on 01/10/2021, for: Accoriding to Dr Artis Flock Note:   Chief Complaint: overall weakness, right sided weakness and slurred speech    HPI: Christopher [name] is a 55 y.o. male with medical history significant tobacco abuse (1 PPD) and hx of cocaine abuse who last used about 3 days ago, but states he uses daily who presented with 1+ day history of slurred speech and right sided weakness.  He woke up yesterday AM and his speech was slurred and when he got up he was off balance and didn't have any strength, but more noticeable on the right side. He feels like he is walking all over the place when he tries to walk. He states he feels weak all over. His brother came and got him to do some work and he told him he couldn't do any work because he couldn't stand up. His boss didn't like this so they brought him to ER. He has never had anything like this happen to him before. He states his speech is still slurred, but the weakness seems to have improved.    He denies any facial drooping, loss of sensation, aphasia. He denies any fever/chills, coughing, shortness of breath, chest pain, palpitations, stomach pain, N/V/D, leg swelling, rashes.    Family history of strokes in his father. He had 2 strokes.    CT Head WO Contrast:  IMPRESSION: 1. Age indeterminate lacunar infarct in the lateral left thalamus. 2. Lacunar infarcts involving the right caudate head and right putamen appear more remote. 3. Areas of cortical  hypoattenuation at the exophytic poles, left greater than right. These may represent acute or subacute infarcts. MRI could be used for further evaluation if clinically indicated. 4. Atrophy and white matter disease likely reflects the sequela of chronic microvascular ischemia.  MRI Brain WO Contrast:  IMPRESSION: 11 mm acute lacunar infarct within the right thalamocapsular junction.   5 mm probable subacute infarct within the subcortical white matter of the mid-to-anterior right frontal lobe.   Chronic lacunar infarcts within the bilateral basal ganglia and left thalamus.   Background mild-to-moderate chronic small vessel ischemic disease within the cerebral white matter and pons.   Mild generalized parenchymal atrophy.   Minimal paranasal sinus mucosal thickening, as described.  CT Angio Head and Neck:  IMPRESSION: 1. Negative CTA for large vessel occlusion. 2. 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 3. Additional mild-to-moderate atheromatous irregularity elsewhere about the major arterial vasculature of the head and neck. No other hemodynamically significant or correctable stenosis. 4. Emphysema (ICD10-J43.9).  His UDS was Positive for cocaine and THC.  Neurology was consulted : he was maintained on aspirin and Plavix : DAPT x 3 weeks,   He was admitted to inpatient rehabilitation on 01/12/2021 and discharged home on 01/21/2021. He will follow continue his HEP as tolerated. [facility] was called, to schedule HFU appointment, he verbalizes understanding. He has a scheduled PCP appointment on 02/15/2021.  Christopher [name] denies any pain. He rates his pain 0. Also reports he has a good appetite.    Pain Inventory Average Pain 0 Pain Right Now 0 My pain is  no pain  LOCATION OF PAIN  no pain  BOWEL Number of stools per week: 7 Oral laxative use No  Type of laxative  na Enema or suppository use No  History of colostomy No  Incontinent No   BLADDER Normal In and out cath, frequency na Able to self cath  na Bladder incontinence No  Frequent urination No  Leakage with coughing No  Difficulty starting stream No  Incomplete bladder emptying No    Mobility walk with assistance use a cane how many minutes can you walk? 2-3 ability to climb steps?  yes do you drive?  no  Function not employed: date last employed .  Neuro/Psych trouble walking  Prior Studies Hospital f/u  Physicians involved in your care Hospital f/u   Family History  Problem Relation Age of Onset   Cancer Mother    Stroke Father    Social History   Socioeconomic History   Marital status: Divorced    Spouse name: Not on file   Number of children: Not on file   Years of education: Not on file   Highest education level: Not on file  Occupational History   Not on file  Tobacco Use   Smoking status: Every Day    Packs/day: 0.50    Types: Cigarettes   Smokeless tobacco: Former  Substance and Sexual Activity   Alcohol use: Yes    Comment: 2-3 "sparks" a day   Drug use: Yes    Types: "Crack" cocaine    Comment: "only every once in awhile"   Sexual activity: Not on file  Other Topics Concern   Not on file  Social History Narrative   Not on file   Social Determinants of Health   Financial Resource Strain: Not on file  Food Insecurity: Not on file  Transportation Needs: Not on file  Physical Activity: Not on file  Stress: Not on file  Social Connections: Not on file   No past surgical history on file. Past Medical History:  Diagnosis Date   PUD (peptic ulcer disease) 2008   treated with meds   Ht 5\' 9"  (1.753 m)   Wt 125 lb (56.7 kg)   BMI 18.46 kg/m   Opioid Risk Score:   Fall Risk Score:  `1  Depression screen PHQ 2/9  No flowsheet data found.   Review of Systems  Musculoskeletal:  Positive for gait problem.  All other systems  reviewed and are negative.     Objective:   Physical Exam Vitals and nursing note reviewed.  Musculoskeletal:     Comments: No Physical Exam Performed: Telephone Visit         Assessment & Plan:  Acute CVA( Cerebrovascular Accident): Continue HEP as Tolerated. Encouraged to scheduled HFU appointment with [facility], he verbalizes understanding. Continue to monitor.  2.  Essential Hypertension: Continue current medication regimen. He has a scheduled appointment with PCP on 02/15/2021.  3. Dyslipidemia: Continue current medication regimen. He has a scheduled appointment with PCP on 02/15/2021, Continue to Monitor.  4.  Polysubstance abuse.: Christopher [name] states he hs not uses cocaine since his discharge, admits to using marijuana. Educated on cessation of polysubstance abuse, he verbalizes understanding. Continue to monitor.   F/U in 4 - 6 weeks with Dr Rosana Berger  Telephone Call Established  Patient Location of Patient: In his Home Location of Provider: In the Office Total Time Spent: 20 Minutes

## 2021-02-10 NOTE — Progress Notes (Signed)
Erroneous encounter

## 2021-02-11 ENCOUNTER — Other Ambulatory Visit (HOSPITAL_COMMUNITY): Payer: Self-pay

## 2021-02-11 ENCOUNTER — Telehealth (HOSPITAL_COMMUNITY): Payer: Self-pay

## 2021-02-11 NOTE — Telephone Encounter (Signed)
Transitions of Care Pharmacy  ° °Call attempted for a pharmacy transitions of care follow-up. HIPAA appropriate voicemail was left with call back information provided.  ° °Call attempt #1. Will follow-up in 2-3 days.  °  °

## 2021-02-14 ENCOUNTER — Telehealth (HOSPITAL_COMMUNITY): Payer: Self-pay

## 2021-02-14 ENCOUNTER — Other Ambulatory Visit (HOSPITAL_COMMUNITY): Payer: Self-pay

## 2021-02-14 NOTE — Telephone Encounter (Signed)
Pharmacy Transitions of Care Follow-up Telephone Call  Date of discharge: 01/21/21  Discharge Diagnosis: Acute CVA  How have you been since you were released from the hospital?  Patient has completed course of Clopidogrel, only took medication for 3 weeks. No questions about meds at this time.  Medication changes made at discharge:     START taking: lisinopril (ZESTRIL)  CHANGE how you take: acetaminophen (TYLENOL)  STOP taking: senna-docusate 8.6-50 MG tablet (Senokot-S)   Medication changes verified by the patient? Yes    Medication Accessibility:  Home Pharmacy: not discussed   Was the patient provided with refills on discharged medications? No   Have all prescriptions been transferred from [facility] to home pharmacy? N/a   Is the patient able to afford medications? No insurance, may need assistance    Medication Review:  CLOPIDOGREL (PLAVIX) Clopidogrel 75 mg once daily.  Patient has completed course of Plavix  Follow-up Appointments:  PCP Hospital f/u appt confirmed?  Scheduled to see Dr. Zonia Kief on 02/15/21 @ [facility].   Specialist Hospital f/u appt confirmed? Scheduled to see Dr. Maisie Fus on 02/09/21 @ [facility].   If their condition worsens, is the pt aware to call PCP or go to the Emergency Dept.? Yes  Final Patient Assessment: Patient has follow up scheduled and knows to get refills at f/u

## 2021-02-15 ENCOUNTER — Encounter: Payer: Self-pay | Admitting: Family

## 2021-02-15 ENCOUNTER — Encounter: Payer: Self-pay | Admitting: Registered Nurse

## 2021-02-15 DIAGNOSIS — Z09 Encounter for follow-up examination after completed treatment for conditions other than malignant neoplasm: Secondary | ICD-10-CM

## 2021-02-15 DIAGNOSIS — N179 Acute kidney failure, unspecified: Secondary | ICD-10-CM

## 2021-02-15 DIAGNOSIS — I10 Essential (primary) hypertension: Secondary | ICD-10-CM

## 2021-02-15 DIAGNOSIS — E785 Hyperlipidemia, unspecified: Secondary | ICD-10-CM

## 2021-02-15 DIAGNOSIS — F1910 Other psychoactive substance abuse, uncomplicated: Secondary | ICD-10-CM

## 2021-02-15 DIAGNOSIS — E871 Hypo-osmolality and hyponatremia: Secondary | ICD-10-CM

## 2021-02-17 ENCOUNTER — Other Ambulatory Visit: Payer: Self-pay

## 2021-02-17 ENCOUNTER — Ambulatory Visit (INDEPENDENT_AMBULATORY_CARE_PROVIDER_SITE_OTHER): Payer: Medicaid Other | Admitting: Nurse Practitioner

## 2021-02-17 DIAGNOSIS — I639 Cerebral infarction, unspecified: Secondary | ICD-10-CM | POA: Diagnosis not present

## 2021-02-17 MED ORDER — ATORVASTATIN CALCIUM 20 MG PO TABS: 20.0000 mg | ORAL_TABLET | Freq: Every day | ORAL | 0 refills | Status: DC

## 2021-02-17 MED ORDER — LISINOPRIL 10 MG PO TABS: 10.0000 mg | ORAL_TABLET | Freq: Every day | ORAL | 3 refills | Status: DC

## 2021-02-17 MED ORDER — ASPIRIN 81 MG PO CHEW: 81.0000 mg | CHEWABLE_TABLET | Freq: Every day | ORAL | 0 refills | Status: DC

## 2021-02-17 NOTE — Progress Notes (Signed)
@Patient  ID: , male    DOB: [birth year], 55 y.o.   MRN: [id number]  Chief Complaint  Patient presents with   Hospitalization Follow-up    Referring provider: [id number], NP   HPI  Patient presents today for hospital follow-up.  He was admitted to the hospital on 01/12/2021 discharged on 01/21/2021 with CVA.  Patient was discharged home on aspirin, Lipitor, Plavix, lisinopril.  Patient's blood pressure still elevated in office today.  We discussed that we will increase his dosage of lisinopril.  Patient does need to follow-up with neurology.  The referral was placed at hospital discharge.  Patient was also referred to physical therapy but is unable to afford therapy.  He has been active at home and has been doing physical therapy exercises that were given to him in the hospital.  He has been active and walking each day.  Overall no focal deficits noted. Denies f/c/s, n/v/d, hemoptysis, PND, chest pain or edema.    No Known Allergies  Immunization History  Administered Date(s) Administered   Td 11/14/2012    Past Medical History:  Diagnosis Date   PUD (peptic ulcer disease) 2008   treated with meds    Tobacco History: Social History   Tobacco Use  Smoking Status Every Day   Packs/day: 0.50   Types: Cigarettes  Smokeless Tobacco Former   Ready to quit: No Counseling given: Yes   Outpatient Encounter Medications as of 02/17/2021  Medication Sig   lisinopril (ZESTRIL) 10 MG tablet Take 1 tablet (10 mg total) by mouth daily.   acetaminophen (TYLENOL) 325 MG tablet Take 1-2 tablets (325-650 mg total) by mouth every 4 (four) hours as needed for mild pain.   aspirin 81 MG chewable tablet Chew 1 tablet (81 mg total) by mouth daily.   atorvastatin (LIPITOR) 20 MG tablet Take 1 tablet (20 mg total) by mouth daily.   clopidogrel (PLAVIX) 75 MG tablet Take 1 tablet (75 mg total) by mouth daily.   [DISCONTINUED] aspirin 81 MG chewable tablet Chew 1 tablet (81 mg  total) by mouth daily.   [DISCONTINUED] atorvastatin (LIPITOR) 20 MG tablet Take 1 tablet (20 mg total) by mouth daily.   [DISCONTINUED] lisinopril (ZESTRIL) 5 MG tablet Take 1 tablet (5 mg total) by mouth daily.   No facility-administered encounter medications on file as of 02/17/2021.     Review of Systems  Review of Systems  Constitutional: Negative.   HENT: Negative.    Cardiovascular: Negative.   Gastrointestinal: Negative.   Allergic/Immunologic: Negative.   Neurological: Negative.   Psychiatric/Behavioral: Negative.        Physical Exam  BP (!) 186/97   Pulse 83   Resp 18   SpO2 95%   Wt Readings from Last 5 Encounters:  02/09/21 125 lb (56.7 kg)  01/12/21 115 lb 14.4 oz (52.6 kg)  01/12/21 115 lb 11.2 oz (52.5 kg)  11/17/12 136 lb 9.6 oz (62 kg)     Physical Exam Vitals and nursing note reviewed.  Constitutional:      General: He is not in acute distress.    Appearance: He is well-developed.  Cardiovascular:     Rate and Rhythm: Normal rate and regular rhythm.  Pulmonary:     Effort: Pulmonary effort is normal.     Breath sounds: Normal breath sounds.  Skin:    General: Skin is warm and dry.  Neurological:     Mental Status: He is alert and oriented to person, place,  and time.      Assessment & Plan:   Acute CVA (cerebrovascular accident) [facility]) Continue current medications  Stay active  Please keep upcoming appointment with neurology  Follow up:  Follow up in 2 weeks with PCP recheck BP     Ivonne Andrew, NP 02/22/2021

## 2021-02-17 NOTE — Patient Instructions (Signed)
CVA:  Continue current medications  Stay active  Please keep upcoming appointment with neurology  Follow up:  Follow up in 2 weeks with PCP recheck BP

## 2021-02-22 ENCOUNTER — Encounter: Payer: Self-pay | Admitting: Nurse Practitioner

## 2021-02-22 NOTE — Assessment & Plan Note (Signed)
Continue current medications  Stay active  Please keep upcoming appointment with neurology  Follow up:  Follow up in 2 weeks with PCP recheck BP

## 2021-02-24 ENCOUNTER — Ambulatory Visit (INDEPENDENT_AMBULATORY_CARE_PROVIDER_SITE_OTHER): Payer: Medicaid Other | Admitting: Family Medicine

## 2021-02-24 ENCOUNTER — Encounter: Payer: Self-pay | Admitting: Family Medicine

## 2021-02-24 ENCOUNTER — Other Ambulatory Visit: Payer: Self-pay

## 2021-02-24 VITALS — BP 175/83 | HR 76 | Temp 98.2°F | Resp 16 | Wt 129.2 lb

## 2021-02-24 DIAGNOSIS — Z720 Tobacco use: Secondary | ICD-10-CM

## 2021-02-24 DIAGNOSIS — I10 Essential (primary) hypertension: Secondary | ICD-10-CM | POA: Diagnosis not present

## 2021-02-24 MED ORDER — LISINOPRIL-HYDROCHLOROTHIAZIDE 20-25 MG PO TABS: 1.0000 | ORAL_TABLET | Freq: Every day | ORAL | 3 refills | Status: DC

## 2021-02-25 ENCOUNTER — Encounter: Payer: Self-pay | Admitting: Family Medicine

## 2021-02-25 NOTE — Progress Notes (Signed)
Established Patient Office Visit  Subjective:  Patient ID: Christopher [name], male    DOB: Dec 27, 1965  Age: 55 y.o. MRN: [id number]  CC:  Chief Complaint  Patient presents with   Follow-up    HPI TYDUS SANMIGUEL presents for follow up of hypertension. Patient reports taking meds as recommended. Patient denies acute complaints or concerns.   Past Medical History:  Diagnosis Date   PUD (peptic ulcer disease) 2008   treated with meds    No past surgical history on file.  Family History  Problem Relation Age of Onset   Cancer Mother    Stroke Father     Social History   Socioeconomic History   Marital status: Divorced    Spouse name: Not on file   Number of children: Not on file   Years of education: Not on file   Highest education level: Not on file  Occupational History   Not on file  Tobacco Use   Smoking status: Every Day    Packs/day: 0.50    Types: Cigarettes   Smokeless tobacco: Former  Substance and Sexual Activity   Alcohol use: Yes    Comment: 2-3 "sparks" a day   Drug use: Yes    Types: "Crack" cocaine    Comment: "only every once in awhile"   Sexual activity: Not on file  Other Topics Concern   Not on file  Social History Narrative   Not on file   Social Determinants of Health   Financial Resource Strain: Not on file  Food Insecurity: Not on file  Transportation Needs: Not on file  Physical Activity: Not on file  Stress: Not on file  Social Connections: Not on file  Intimate Partner Violence: Not on file    ROS Review of Systems  All other systems reviewed and are negative.  Objective:   Today's Vitals: BP (!) 175/83   Pulse 76   Temp 98.2 F (36.8 C) (Oral)   Resp 16   Wt 129 lb 3.2 oz (58.6 kg)   SpO2 97%   BMI 19.08 kg/m   Physical Exam Vitals and nursing note reviewed.  Constitutional:      General: He is not in acute distress. Cardiovascular:     Rate and Rhythm: Normal rate and regular rhythm.  Pulmonary:      Effort: Pulmonary effort is normal.     Breath sounds: Normal breath sounds.  Abdominal:     Palpations: Abdomen is soft.     Tenderness: There is no abdominal tenderness.  Musculoskeletal:     Right lower leg: No edema.     Left lower leg: No edema.  Neurological:     General: No focal deficit present.     Mental Status: He is alert and oriented to person, place, and time.    Assessment & Plan:   1. Uncontrolled hypertension Readings are slightly improved but still greatly elevated above goal. Will increase lisinopril from 10 mg to 20 mg daily and add HCTZ 25mg  daily to regimen. Monitor.   2. Episodic tobacco abuse      Outpatient Encounter Medications as of 02/24/2021  Medication Sig   acetaminophen (TYLENOL) 325 MG tablet Take 1-2 tablets (325-650 mg total) by mouth every 4 (four) hours as needed for mild pain.   aspirin 81 MG chewable tablet Chew 1 tablet (81 mg total) by mouth daily.   atorvastatin (LIPITOR) 20 MG tablet Take 1 tablet (20 mg total) by mouth daily.  clopidogrel (PLAVIX) 75 MG tablet Take 1 tablet (75 mg total) by mouth daily.   lisinopril-hydrochlorothiazide (ZESTORETIC) 20-25 MG tablet Take 1 tablet by mouth daily.   [DISCONTINUED] lisinopril (ZESTRIL) 10 MG tablet Take 1 tablet (10 mg total) by mouth daily.   No facility-administered encounter medications on file as of 02/24/2021.    Follow-up: Return in about 3 weeks (around 03/17/2021) for follow up.   [name], MD

## 2021-03-10 ENCOUNTER — Ambulatory Visit (INDEPENDENT_AMBULATORY_CARE_PROVIDER_SITE_OTHER): Payer: Medicaid Other | Admitting: Family Medicine

## 2021-03-10 ENCOUNTER — Other Ambulatory Visit: Payer: Self-pay

## 2021-03-10 ENCOUNTER — Encounter: Payer: Self-pay | Admitting: Family Medicine

## 2021-03-10 VITALS — BP 144/74 | HR 76 | Temp 98.4°F | Resp 16 | Wt 128.6 lb

## 2021-03-10 DIAGNOSIS — I10 Essential (primary) hypertension: Secondary | ICD-10-CM

## 2021-03-10 DIAGNOSIS — E785 Hyperlipidemia, unspecified: Secondary | ICD-10-CM

## 2021-03-10 MED ORDER — LISINOPRIL-HYDROCHLOROTHIAZIDE 20-25 MG PO TABS
1.0000 | ORAL_TABLET | Freq: Every day | ORAL | 0 refills | Status: DC
Start: 2021-03-10 — End: 2021-06-15

## 2021-03-11 NOTE — Progress Notes (Signed)
Established Patient Office Visit  Subjective:  Patient ID: Christopher [name], male    DOB: 11-08-1965  Age: 55 y.o. MRN: [id number]  CC:  Chief Complaint  Patient presents with   Follow-up   Hypertension    HPI CONLAN Christopher [name] presents for follow-up of hypertension and hyperlipidemia.  Patient denies acute complaints or concerns.  Past Medical History:  Diagnosis Date   PUD (peptic ulcer disease) 2008   treated with meds      Social History   Socioeconomic History   Marital status: Divorced    Spouse name: Not on file   Number of children: Not on file   Years of education: Not on file   Highest education level: Not on file  Occupational History   Not on file  Tobacco Use   Smoking status: Every Day    Packs/day: 0.50    Types: Cigarettes   Smokeless tobacco: Former  Substance and Sexual Activity   Alcohol use: Yes    Comment: 2-3 "sparks" a day   Drug use: Yes    Types: "Crack" cocaine    Comment: "only every once in awhile"   Sexual activity: Not on file  Other Topics Concern   Not on file  Social History Narrative   Not on file   Social Determinants of Health   Financial Resource Strain: Not on file  Food Insecurity: Not on file  Transportation Needs: Not on file  Physical Activity: Not on file  Stress: Not on file  Social Connections: Not on file  Intimate Partner Violence: Not on file    ROS Review of Systems  All other systems reviewed and are negative.  Objective:   Today's Vitals: BP (!) 144/74   Pulse 76   Temp 98.4 F (36.9 C) (Oral)   Resp 16   Wt 128 lb 9.6 oz (58.3 kg)   SpO2 97%   BMI 18.99 kg/m   Physical Exam Vitals and nursing note reviewed.  Constitutional:      General: He is not in acute distress. Cardiovascular:     Rate and Rhythm: Normal rate and regular rhythm.  Pulmonary:     Effort: Pulmonary effort is normal.     Breath sounds: Normal breath sounds.  Abdominal:     Palpations: Abdomen is soft.      Tenderness: There is no abdominal tenderness.  Musculoskeletal:     Right lower leg: No edema.     Left lower leg: No edema.  Neurological:     General: No focal deficit present.     Mental Status: He is alert and oriented to person, place, and time.    Assessment & Plan:  1. Essential hypertension Readings are greatly improved.  We will continue present management and monitor.  Meds were refilled.  2. Dyslipidemia Continue present management.   Outpatient Encounter Medications as of 03/10/2021  Medication Sig   acetaminophen (TYLENOL) 325 MG tablet Take 1-2 tablets (325-650 mg total) by mouth every 4 (four) hours as needed for mild pain.   aspirin 81 MG chewable tablet Chew 1 tablet (81 mg total) by mouth daily.   atorvastatin (LIPITOR) 20 MG tablet Take 1 tablet (20 mg total) by mouth daily.   clopidogrel (PLAVIX) 75 MG tablet Take 1 tablet (75 mg total) by mouth daily.   [DISCONTINUED] lisinopril-hydrochlorothiazide (ZESTORETIC) 20-25 MG tablet Take 1 tablet by mouth daily.   lisinopril-hydrochlorothiazide (ZESTORETIC) 20-25 MG tablet Take 1 tablet by mouth daily.   No  facility-administered encounter medications on file as of 03/10/2021.    Follow-up: Return in about 3 months (around 06/10/2021) for follow up, chronic med issues.   Christopher Raymond, MD

## 2021-03-16 ENCOUNTER — Ambulatory Visit: Payer: Self-pay | Admitting: Family

## 2021-03-23 ENCOUNTER — Other Ambulatory Visit: Payer: Self-pay | Admitting: *Deleted

## 2021-03-23 ENCOUNTER — Telehealth: Payer: Self-pay | Admitting: Family Medicine

## 2021-03-23 DIAGNOSIS — E785 Hyperlipidemia, unspecified: Secondary | ICD-10-CM

## 2021-03-23 MED ORDER — ATORVASTATIN CALCIUM 20 MG PO TABS: 20.0000 mg | ORAL_TABLET | Freq: Every day | ORAL | 0 refills | Status: DC

## 2021-03-23 NOTE — Telephone Encounter (Signed)
He's completely out of atorvastatin (LIPITOR) 20 MG tablet [[id number]]  also needs  lisinopril-hydrochlorothiazide (ZESTORETIC) 20-25 MG tablet [[id number]]  [facility] [address] ([address]), [place] - [address] [place] ([place]) [place] [address] Phone:  [phone]  Fax:  [phone]  Please advise and thank you

## 2021-04-08 ENCOUNTER — Encounter: Payer: Self-pay | Admitting: Physical Medicine & Rehabilitation

## 2021-04-08 ENCOUNTER — Other Ambulatory Visit: Payer: Self-pay

## 2021-04-08 ENCOUNTER — Encounter: Payer: Medicaid Other | Attending: Registered Nurse | Admitting: Physical Medicine & Rehabilitation

## 2021-04-08 VITALS — BP 124/73 | HR 78 | Temp 98.9°F | Ht 69.0 in | Wt 132.8 lb

## 2021-04-08 DIAGNOSIS — I6503 Occlusion and stenosis of bilateral vertebral arteries: Secondary | ICD-10-CM | POA: Insufficient documentation

## 2021-04-08 NOTE — Progress Notes (Signed)
Subjective:    Patient ID: Christopher [name], male    DOB: May 16, 1965, 55 y.o.   MRN: [id number]  Christopher [name] is a 55 y.o. LH-male with history of tobacco, marijuana and cocaine use otherwise in good health who was admitted on 01/10/2021 with 1 day history of slurred speech and left hemiparesis.  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  UDS was positive for cocaine and THC.  Dr. Erlinda Hong felt the stroke was likely due to cocaine use and recommended DAPT x3 weeks followed by aspirin alone.  Therapy evaluations done revealing mild dysarthria with unsteady gait and balance deficits.  CIR was recommended due to functional decline   Admit date: 01/12/2021 Discharge date: 01/21/2021 HPI 55 year old male who returns for post stroke rehab follow-up.  He was discharged from inpatient rehab over 2 months ago.  He is back to independent level for all self-care and mobility.  He ambulates with a cane.  He was doing very well until Monday when he developed increasing weakness in the right upper extremity. Had weakness RUE after moving objects for ~41min, pt felt like he lost all strength in shoulder and hand, pt started moving it after 3-5 min and regained strength in Right shoulder but still feels weak in RIght hand Patient denies swallowing issues denies new visual issues, no sensory loss.  No speech problems. The patient continues to abuse tobacco and marijuana but denies cocaine abuse. Pain Inventory Average Pain 0 Pain Right Now 0 My pain is  no pain  LOCATION OF PAIN  no pain  BOWEL Number of stools per week: 7   BLADDER Normal    Mobility use a cane how many minutes can you walk? 20 ability to climb steps?  yes do you drive?  no  Function disabled: date disabled  2022  Neuro/Psych weakness numbness trouble walking dizziness  Prior Studies Any changes since last visit?  no  Physicians involved in your care Any changes since last visit?  no   Family History  Problem Relation Age of Onset   Cancer Mother    Stroke Father    Social History   Socioeconomic History   Marital status: Divorced    Spouse name: Not on file   Number of children: Not on file   Years of education: Not on file   Highest education level: Not on file  Occupational History   Not on file  Tobacco Use   Smoking status: Every Day    Packs/day: 0.50    Types: Cigarettes   Smokeless tobacco: Former  Scientific laboratory technician Use: Never used  Substance and Sexual Activity   Alcohol use: Yes    Comment: 2-3 "sparks" a day   Drug use: Yes    Types: "Crack" cocaine    Comment: "only every once in awhile"   Sexual activity: Not on file  Other Topics Concern   Not on file  Social History Narrative   Not on file   Social Determinants of Health   Financial Resource Strain: Not on file  Food Insecurity: Not on file  Transportation Needs: Not on file  Physical Activity: Not on file  Stress: Not on file  Social Connections: Not on file   History reviewed. No pertinent surgical history. Past Medical History:  Diagnosis Date   PUD (peptic ulcer disease) 2008   treated with meds   Ht 5\' 9"  (1.753 m)   Wt 132 lb 12.8 oz (60.2 kg)   BMI 19.61 kg/m   Opioid Risk Score:   Fall Risk Score:  `1  Depression screen PHQ 2/9  Depression screen [facility] 2/9 04/08/2021 03/10/2021 02/24/2021 02/09/2021  Decreased Interest 0 0 0 0  Down, Depressed, Hopeless 1 0 0 0  PHQ - 2 Score 1 0 0 0  Altered sleeping 0 0 0 0  Tired, decreased energy 1 0 0 0  Change in appetite 3 0 0 0  Feeling bad or failure about yourself  0 0 0 0  Trouble concentrating 0 0 0 0  Moving slowly or fidgety/restless 0 0 0 0  Suicidal thoughts 0 0 0 0  PHQ-9 Score 5 0 0 0  Difficult doing work/chores  Not difficult at all - - -     Review of Systems  Constitutional: Negative.   HENT: Negative.    Eyes: Negative.   Respiratory: Negative.    Cardiovascular: Negative.   Gastrointestinal: Negative.   Endocrine: Negative.   Genitourinary: Negative.   Musculoskeletal: Negative.   Skin: Negative.   Allergic/Immunologic: Negative.   Neurological:  Positive for dizziness, weakness and numbness.  Hematological: Negative.   Psychiatric/Behavioral: Negative.        Objective:   Physical Exam Vitals and nursing note reviewed.  Constitutional:      Appearance: He is normal weight.  HENT:     Head: Normocephalic and atraumatic.  Eyes:     General: No visual field deficit.    Extraocular Movements: Extraocular movements intact.     Conjunctiva/sclera: Conjunctivae normal.     Pupils: Pupils are equal, round, and reactive to light.  Neurological:     Mental Status: He is alert and oriented to person, place, and time.     Cranial Nerves: Cranial nerves 2-12 are intact. No dysarthria or facial asymmetry.     Sensory: Sensation is intact.     Motor: Weakness present.     Coordination: Finger-Nose-Finger Test abnormal. Heel to Shin Test normal. Impaired rapid alternating movements.     Gait: Gait normal.     Comments: Motor strength is 5/5 bilateral deltoid bicep tricep 4/5 right grip 4+/5 left grip Patient has dysmetria with finger-nose-finger testing on the right side but not on the left side heel-to-shin testing is intact He ambulates without loss of balance no evidence of gait ataxia.           Assessment & Plan:  #1.  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  He has more recent onset of right upper extremity weakness and clumsiness.  He has not sought medical care for this.  I discussed with his most likely a new stroke and that he should go to the emergency department for further evaluation to  avoid worsening symptoms.  He is obviously beyond the tPA window as well as revascularization but will need further evaluation by neurology.  He continues on aspirin but at 1 point was on Plavix and aspirin for 3 weeks after his last infarct.  Patient states he is not sure he will go to the ED.  He might go tomorrow morning.  I have also made referral to neurology with request for him to be seen next week.

## 2021-04-08 NOTE — Patient Instructions (Signed)
Recommend going to ER to prevent another stroke from happening

## 2021-04-11 ENCOUNTER — Ambulatory Visit: Payer: Self-pay | Admitting: Family

## 2021-05-18 ENCOUNTER — Inpatient Hospital Stay: Payer: Self-pay | Admitting: Adult Health

## 2021-06-09 ENCOUNTER — Encounter: Payer: Self-pay | Admitting: Family Medicine

## 2021-06-09 ENCOUNTER — Other Ambulatory Visit: Payer: Self-pay

## 2021-06-09 ENCOUNTER — Ambulatory Visit (INDEPENDENT_AMBULATORY_CARE_PROVIDER_SITE_OTHER): Payer: Medicaid Other | Admitting: Family Medicine

## 2021-06-09 VITALS — BP 139/71 | HR 88 | Temp 98.1°F | Resp 16 | Wt 137.6 lb

## 2021-06-09 DIAGNOSIS — I10 Essential (primary) hypertension: Secondary | ICD-10-CM | POA: Diagnosis not present

## 2021-06-09 DIAGNOSIS — E785 Hyperlipidemia, unspecified: Secondary | ICD-10-CM | POA: Diagnosis not present

## 2021-06-09 MED ORDER — ATORVASTATIN CALCIUM 20 MG PO TABS: 20.0000 mg | ORAL_TABLET | Freq: Every day | ORAL | 1 refills | Status: DC

## 2021-06-09 MED ORDER — ASPIRIN 81 MG PO CHEW: 81.0000 mg | CHEWABLE_TABLET | Freq: Every day | ORAL | 1 refills | Status: DC

## 2021-06-09 MED ORDER — LISINOPRIL-HYDROCHLOROTHIAZIDE 20-12.5 MG PO TABS: 1.0000 | ORAL_TABLET | Freq: Every day | ORAL | 1 refills | Status: DC

## 2021-06-09 NOTE — Progress Notes (Signed)
Patient is here for follow-up HTN. Patient said that he feels pretty good. He has no new concerns today

## 2021-06-10 ENCOUNTER — Encounter: Payer: Self-pay | Admitting: Family Medicine

## 2021-06-10 NOTE — Progress Notes (Signed)
Established Patient Office Visit  Subjective:  Patient ID: Christopher [name], male    DOB: 03/17/1966  Age: 56 y.o. MRN: [id number]  CC:  Chief Complaint  Patient presents with   Follow-up   Hyperlipidemia    HPI Christopher [name] presents for follow up of hypertension. Patient reports occasional muscle spasms in legs.   Past Medical History:  Diagnosis Date   PUD (peptic ulcer disease) 2008   treated with meds    No past surgical history on file.  Family History  Problem Relation Age of Onset   Cancer Mother    Stroke Father     Social History   Socioeconomic History   Marital status: Divorced    Spouse name: Not on file   Number of children: Not on file   Years of education: Not on file   Highest education level: Not on file  Occupational History   Not on file  Tobacco Use   Smoking status: Every Day    Packs/day: 0.50    Types: Cigarettes   Smokeless tobacco: Former  Scientific laboratory technician Use: Never used  Substance and Sexual Activity   Alcohol use: Yes    Comment: 2-3 "sparks" a day   Drug use: Yes    Types: "Crack" cocaine    Comment: "only every once in awhile"   Sexual activity: Not on file  Other Topics Concern   Not on file  Social History Narrative   Not on file   Social Determinants of Health   Financial Resource Strain: Not on file  Food Insecurity: Not on file  Transportation Needs: Not on file  Physical Activity: Not on file  Stress: Not on file  Social Connections: Not on file  Intimate Partner Violence: Not on file    ROS Review of Systems  All other systems reviewed and are negative.  Objective:   Today's Vitals: BP 139/71    Pulse 88    Temp 98.1 F (36.7 C) (Oral)    Resp 16    Wt 137 lb 9.6 oz (62.4 kg)    SpO2 96%    BMI 20.32 kg/m   Physical Exam Vitals and nursing note reviewed.  Constitutional:      General: He is not in acute distress. Cardiovascular:     Rate and Rhythm: Normal rate and regular rhythm.   Pulmonary:     Effort: Pulmonary effort is normal.     Breath sounds: Normal breath sounds.  Abdominal:     Palpations: Abdomen is soft.     Tenderness: There is no abdominal tenderness.  Musculoskeletal:     Right lower leg: No edema.     Left lower leg: No edema.  Neurological:     General: No focal deficit present.     Mental Status: He is alert and oriented to person, place, and time.    Assessment & Plan:   1. Essential hypertension 2/2 occasional muscle spasms will change med from lisinopril-HCTZ from 20-25 to 20-12.5. Monitor  2. Dyslipidemia Continue present management. Meds refilled.  - atorvastatin (LIPITOR) 20 MG tablet; Take 1 tablet (20 mg total) by mouth daily.  Dispense: 90 tablet; Refill: 1    Outpatient Encounter Medications as of 06/09/2021  Medication Sig   lisinopril-hydrochlorothiazide (ZESTORETIC) 20-12.5 MG tablet Take 1 tablet by mouth daily.   acetaminophen (TYLENOL) 325 MG tablet Take 1-2 tablets (325-650 mg total) by mouth every 4 (four) hours as needed for mild pain.  aspirin 81 MG chewable tablet Chew 1 tablet (81 mg total) by mouth daily.   atorvastatin (LIPITOR) 20 MG tablet Take 1 tablet (20 mg total) by mouth daily.   clopidogrel (PLAVIX) 75 MG tablet Take 1 tablet (75 mg total) by mouth daily. (Patient not taking: Reported on 04/08/2021)   lisinopril-hydrochlorothiazide (ZESTORETIC) 20-25 MG tablet Take 1 tablet by mouth daily.   [DISCONTINUED] aspirin 81 MG chewable tablet Chew 1 tablet (81 mg total) by mouth daily.   [DISCONTINUED] atorvastatin (LIPITOR) 20 MG tablet Take 1 tablet (20 mg total) by mouth daily.   No facility-administered encounter medications on file as of 06/09/2021.    Follow-up: Return in about 6 months (around 12/07/2021) for follow up.   Becky Sax, MD

## 2021-06-14 NOTE — Progress Notes (Signed)
PATIENT: Christopher [name] DOB: Jun 25, 1965  REASON FOR VISIT: Stroke Hospital Follow-up  HISTORY FROM: Patient  HISTORY OF PRESENT ILLNESS: Today 06/15/21 Christopher [name] here today for hospital follow-up, post stroke January 10, 2021.  Admitted for slurred speech, falling, left-sided weakness. CT showed left thalamic, left caudate head and right BG age-indeterminate infarcts.  MRI showed right BG acute infarct. UDS positive for cocaine and THC.  LDL 60, A1c 5.8, completed inpatient rehab (admitted 01/12/21) improvement in left sided strength and speech. He felt 75% back to normal after hospital discharge.  He is homeless, stays with his sister some. Looking for work as a Dealer. Not using cocaine anymore, still using THC, smokes cigarettes. 1 month ago he was in the car, and right arm went numb, lasted 2 weeks, he did his own rehab to increase strength, completely resolved. Reports a few dizzy spells in the morning, lasted a few hours. 1 time the left he felt the left leg was shaky. No falls, no longer using cane. Remains on aspirin 81 mg daily. Denies neck pain. Here today alone.   HISTORY Copied Dr. Erlinda Hong Note 01/11/21: 56 year old male with history of smoking, cocaine abuse admitted for slurred speech, falling, left-sided weakness.  CT showed left thalamic, left caudate head and right BG age-indeterminate infarcts.  MRI showed right BG acute infarct.  CTA head and neck bilateral VA stenosis.  EF 55 to 60%.  A1c 5.8, LDL 60.  UDS positive for cocaine and THC.  Creatinine 1.2, WBC 10.9.   On exam, awake, alert, eyes open, orientated to age, place, month. No aphasia, fluent language, following all simple commands. Able to name and repeat. No gaze palsy, tracking bilaterally, visual field full, PERRL. Slight left facial droop. Tongue midline. RUE and RLE 5/5, LUE 3+/5 proximal and 4/5 distally. LLE proximal and distal 4+/5. Sensation symmetrical bilaterally, b/l FTN intact although left FTN slow, gait  not tested.    Etiology for patient stroke likely due to small vessel disease in the setting of smoking and cocaine abuse.  Cocaine and smoking cessation education provided, and he is willing to quit.  Recommend aspirin 81 and Plavix 75 DAPT for 3 weeks and then aspirin alone.  Start Lipitor 20 low-dose statin for stroke prevention given LDL at goal.  PT/OT recommend CIR.  REVIEW OF SYSTEMS: Out of a complete 14 system review of symptoms, the patient complains only of the following symptoms, and all other reviewed systems are negative.  See HPI  ALLERGIES: No Known Allergies  HOME MEDICATIONS: Outpatient Medications Prior to Visit  Medication Sig Dispense Refill   acetaminophen (TYLENOL) 325 MG tablet Take 1-2 tablets (325-650 mg total) by mouth every 4 (four) hours as needed for mild pain.     aspirin 81 MG chewable tablet Chew 1 tablet (81 mg total) by mouth daily. 100 tablet 1   atorvastatin (LIPITOR) 20 MG tablet Take 1 tablet (20 mg total) by mouth daily. 90 tablet 1   clopidogrel (PLAVIX) 75 MG tablet Take 1 tablet (75 mg total) by mouth daily. 11 tablet 0   lisinopril-hydrochlorothiazide (ZESTORETIC) 20-12.5 MG tablet Take 1 tablet by mouth daily. 90 tablet 1   lisinopril-hydrochlorothiazide (ZESTORETIC) 20-25 MG tablet Take 1 tablet by mouth daily. 90 tablet 0   No facility-administered medications prior to visit.    PAST MEDICAL HISTORY: Past Medical History:  Diagnosis Date   PUD (peptic ulcer disease) 2008   treated with meds    PAST SURGICAL HISTORY: History  reviewed. No pertinent surgical history.  FAMILY HISTORY: Family History  Problem Relation Age of Onset   Cancer Mother    Stroke Father     SOCIAL HISTORY: Social History   Socioeconomic History   Marital status: Divorced    Spouse name: Not on file   Number of children: Not on file   Years of education: Not on file   Highest education level: Not on file  Occupational History   Not on file  Tobacco  Use   Smoking status: Every Day    Packs/day: 0.50    Types: Cigarettes   Smokeless tobacco: Former  Building services engineer Use: Never used  Substance and Sexual Activity   Alcohol use: Yes    Comment: 2-3 "sparks" a day   Drug use: Yes    Types: "Crack" cocaine    Comment: "only every once in awhile"   Sexual activity: Not on file  Other Topics Concern   Not on file  Social History Narrative   Not on file   Social Determinants of Health   Financial Resource Strain: Not on file  Food Insecurity: Not on file  Transportation Needs: Not on file  Physical Activity: Not on file  Stress: Not on file  Social Connections: Not on file  Intimate Partner Violence: Not on file   PHYSICAL EXAM  Vitals:   06/15/21 1003  BP: 140/66  Pulse: 84  Weight: 138 lb 8 oz (62.8 kg)  Height: 5\' 9"  (1.753 m)   Body mass index is 20.45 kg/m.  Generalized: Well developed, in no acute distress   Neurological examination  Mentation: Alert oriented to time, place, history taking. Follows all commands speech and language fluent Cranial nerve II-XII: Pupils were equal round reactive to light. Extraocular movements were full, visual field were full on confrontational test. Facial sensation and strength were normal. Head turning and shoulder shrug  were normal and symmetric. Motor: The motor testing reveals 5 over 5 strength of all 4 extremities. Good symmetric motor tone is noted throughout.  Equal grip strength bilaterally. Sensory: Sensory testing is intact to soft touch on all 4 extremities. No evidence of extinction is noted.  Coordination: Cerebellar testing reveals good finger-nose-finger and heel-to-shin bilaterally.  Gait and station: Gait is normal. Tandem gait is slightly unsteady. Reflexes: Deep tendon reflexes are symmetric and normal bilaterally.   DIAGNOSTIC DATA (LABS, IMAGING, TESTING) - I reviewed patient records, labs, notes, testing and imaging myself where available.  Lab  Results  Component Value Date   WBC 7.3 01/17/2021   HGB 14.9 01/17/2021   HCT 45.5 01/17/2021   MCV 90.3 01/17/2021   PLT 287 01/17/2021      Component Value Date/Time   NA 134 (L) 01/20/2021 0503   K 4.3 01/20/2021 0503   CL 100 01/20/2021 0503   CO2 26 01/20/2021 0503   GLUCOSE 95 01/20/2021 0503   BUN 19 01/20/2021 0503   CREATININE 1.07 01/20/2021 0503   CALCIUM 9.0 01/20/2021 0503   PROT 6.5 01/13/2021 0551   ALBUMIN 3.5 01/13/2021 0551   AST 18 01/13/2021 0551   ALT 19 01/13/2021 0551   ALKPHOS 51 01/13/2021 0551   BILITOT 0.4 01/13/2021 0551   GFRNONAA >60 01/20/2021 0503   GFRAA >90 11/15/2012 1044   Lab Results  Component Value Date   CHOL 102 01/11/2021   HDL 25 (L) 01/11/2021   LDLCALC 60 01/11/2021   TRIG 86 01/11/2021   CHOLHDL 4.1 01/11/2021  Lab Results  Component Value Date   HGBA1C 5.8 (H) 01/11/2021   No results found for: VITAMINB12 No results found for: TSH  ASSESSMENT AND PLAN 56 y.o. year old male   1.  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 -5 mm probable subacute infarct within the subcortical white matter of the mid-to-anterior right frontal lobe -Chronic lacunar infarcts within the bilateral basal ganglia and left thalamus -Mild to moderate chronic small vessel ischemic disease -Mild generalized atrophy -2D echocardiogram: EF 55 to 60%, mild LVH, grade 1 diastolic dysfunction, no shunt or  abnormality -A1c 5.8 -Etiology for stroke likely small vessel disease in the setting of smoking and cocaine abuse -Continue aspirin 81 mg daily -I do not detect any weakness on exam -Continue to see PCP for management of vascular risk factors and secondary stroke prevention  -Discussed he should report to the ED immediately with any new onset stroke symptoms, right arm weakness in December has completely resolved  2.  Hyperlipidemia -LDL 60, at goal, less than 70 -On Lipitor 20 mg  3.  Hypertension -On lisinopril-HCTZ -Goal <130/80, close to goal, encouraged to get BP machine at home to monitor  4.  Polysubstance abuse -History of crack cocaine, alcohol, cigarettes, marijuana -Discussed importance of smoking cessation, avoidance of drugs for stroke prevention   Butler Denmark, AGNP-C, DNP 06/15/2021, 10:53 AM [facility] [address], [place], [place] [address] [phone]

## 2021-06-15 ENCOUNTER — Encounter: Payer: Self-pay | Admitting: Neurology

## 2021-06-15 ENCOUNTER — Ambulatory Visit (INDEPENDENT_AMBULATORY_CARE_PROVIDER_SITE_OTHER): Payer: Self-pay | Admitting: Neurology

## 2021-06-15 VITALS — BP 140/66 | HR 84 | Ht 69.0 in | Wt 138.5 lb

## 2021-06-15 DIAGNOSIS — E785 Hyperlipidemia, unspecified: Secondary | ICD-10-CM

## 2021-06-15 DIAGNOSIS — F1910 Other psychoactive substance abuse, uncomplicated: Secondary | ICD-10-CM

## 2021-06-15 DIAGNOSIS — I639 Cerebral infarction, unspecified: Secondary | ICD-10-CM

## 2021-06-15 DIAGNOSIS — I10 Essential (primary) hypertension: Secondary | ICD-10-CM

## 2021-06-15 NOTE — Patient Instructions (Signed)
Continue to see your primary care doctor  For any new symptoms of stroke go to the ER immediately  Work on BP keeping 130/80 Continue aspirin  Return here as needed

## 2021-06-22 NOTE — Progress Notes (Signed)
I agree with the above plan 

## 2021-08-04 ENCOUNTER — Encounter: Payer: Self-pay | Admitting: Family Medicine

## 2021-08-09 ENCOUNTER — Ambulatory Visit: Payer: Self-pay | Admitting: *Deleted

## 2021-08-09 ENCOUNTER — Telehealth: Payer: Self-pay | Admitting: Neurology

## 2021-08-09 NOTE — Telephone Encounter (Signed)
?  Chief Complaint: Dizziness upon standing, fallen twice in past week,    pain in left neck through rotator cuff and 1/2 way down his left arm. ?Symptoms: Left sided pain since Feb.    Dizziness for the past 2 wks ?Frequency: Left sided pain is constant.   Dizziness is intermittently but worse upon standing from sitting. ?Pertinent Negatives: Patient denies  ?Disposition: [] ED /[] Urgent Care (no appt availability in office) / [x] Appointment(In office/virtual)/ []  [facility]/ [] Home Care/ [] Refused Recommended Disposition /[] [facility]/ []  Follow-up with PCP ?Additional Notes: Call warm transferred to [facility] to assist with getting an appt.    ?

## 2021-08-09 NOTE — Telephone Encounter (Signed)
I returned the call to pt's sister [name].  She wants to discuss post stroke symptoms with a nurse or PCP.   No appts available at Primary Care at [facility]. ? ?He had a stroke in Sept. 2022.   He has seen a neurologist. ? ?Pt got on the phone.    ? ? ?Reason for Disposition ? [1] MODERATE dizziness (e.g., interferes with normal activities) AND [2] has NOT been evaluated by physician for this  (Exception: dizziness caused by heat exposure, sudden standing, or poor fluid intake) ? ?Answer Assessment - Initial Assessment Questions ?1. DESCRIPTION: "Describe your dizziness." ?    Have dizziness when I get up.   I have pain down my left neck, my shoulder and into my arm.   It's a constant pain.  ?My left rotator cuff is hurting 9/10 on pain scale. ?2. LIGHTHEADED: "Do you feel lightheaded?" (e.g., somewhat faint, woozy, weak upon standing) ?    When I stand up from sitting I'm dizzy.   I'm dizzy when I first stand up.   ?3. VERTIGO: "Do you feel like either you or the room is spinning or tilting?" (i.e. vertigo) ?    *No Answer* ?4. SEVERITY: "How bad is it?"  "Do you feel like you are going to faint?" "Can you stand and walk?" ?  - MILD: Feels slightly dizzy, but walking normally. ?  - MODERATE: Feels unsteady when walking, but not falling; interferes with normal activities (e.g., school, work). ?  - SEVERE: Unable to walk without falling, or requires assistance to walk without falling; feels like passing out now.  ?    *No Answer* ?5. ONSET:  "When did the dizziness begin?" ?    Past 2 weeks.   It's intermittent.  Last week I 2-3 spells.   The past couple of days I've felt good but when I stand up I get dizzy.    I've fallen twice.    ?3-5 days ago I've fallen twice.     ? ?My shoulder has been hurting since Feb. ?6. AGGRAVATING FACTORS: "Does anything make it worse?" (e.g., standing, change in head position) ?    *No Answer* ?7. HEART RATE: "Can you tell me your heart rate?" "How many beats in 15 seconds?"   (Note: not all patients can do this)   ?    *No Answer* ?8. CAUSE: "What do you think is causing the dizziness?" ?    *No Answer* ?9. RECURRENT SYMPTOM: "Have you had dizziness before?" If Yes, ask: "When was the last time?" "What happened that time?" ?    *No Answer* ?10. OTHER SYMPTOMS: "Do you have any other symptoms?" (e.g., fever, chest pain, vomiting, diarrhea, bleeding) ?      *No Answer* ?11. PREGNANCY: "Is there any chance you are pregnant?" "When was your last menstrual period?" ?      *No Answer* ? ?Protocols used: Dizziness - Lightheadedness-A-AH ? ?

## 2021-08-09 NOTE — Telephone Encounter (Signed)
I called patient. He reports that he is having pain in his L shoulder, neck, & arm. He has not discussed this with his PCP. I recommended that he discuss this with his PCP. If neuro needs to weigh in, I asked that he let us know. We have seen him for a stroke f/u only and was advised to return PRN. Pt verbalized understanding and appreciation. ?

## 2021-08-09 NOTE — Telephone Encounter (Signed)
Pt states since he was seen in Feb. He has been hurting in left shoulder.  Pt states the pain is on left side of neck, goes down to his shoulder and arm.  Pt would like a call to discuss having a MRI or x-ray done, please call. ?

## 2021-08-10 ENCOUNTER — Ambulatory Visit (INDEPENDENT_AMBULATORY_CARE_PROVIDER_SITE_OTHER): Payer: Medicaid Other | Admitting: Physician Assistant

## 2021-08-10 ENCOUNTER — Ambulatory Visit (INDEPENDENT_AMBULATORY_CARE_PROVIDER_SITE_OTHER): Payer: Medicaid Other

## 2021-08-10 ENCOUNTER — Encounter: Payer: Self-pay | Admitting: Physician Assistant

## 2021-08-10 VITALS — BP 108/62 | HR 76 | Temp 98.9°F | Resp 18 | Ht 69.0 in | Wt 132.0 lb

## 2021-08-10 DIAGNOSIS — I10 Essential (primary) hypertension: Secondary | ICD-10-CM

## 2021-08-10 DIAGNOSIS — Z8673 Personal history of transient ischemic attack (TIA), and cerebral infarction without residual deficits: Secondary | ICD-10-CM

## 2021-08-10 DIAGNOSIS — M25512 Pain in left shoulder: Secondary | ICD-10-CM

## 2021-08-10 DIAGNOSIS — R42 Dizziness and giddiness: Secondary | ICD-10-CM

## 2021-08-10 DIAGNOSIS — Z1159 Encounter for screening for other viral diseases: Secondary | ICD-10-CM | POA: Diagnosis not present

## 2021-08-10 DIAGNOSIS — E785 Hyperlipidemia, unspecified: Secondary | ICD-10-CM

## 2021-08-10 DIAGNOSIS — Z125 Encounter for screening for malignant neoplasm of prostate: Secondary | ICD-10-CM

## 2021-08-10 NOTE — Patient Instructions (Signed)
I do encourage you to start checking your blood pressure on a daily basis, also check your blood pressure when you have episodes of dizziness.  Please write these numbers down and have them with you when you come for a follow-up in 2 weeks. ? ?Make sure that you are drinking plenty of water on a daily basis. ? ?We will call you with today's lab results. ? ?Kennieth Rad, PA-C ?Physician Assistant ?[facility] ?[email or web address] ? ? ?Dizziness ?Dizziness is a common problem. It is a feeling of unsteadiness or light-headedness. You may feel like you are about to faint. Dizziness can lead to injury if you stumble or fall. Anyone can become dizzy, but dizziness is more common in older adults. This condition can be caused by a number of things, including medicines, dehydration, or illness. ?Follow these instructions at home: ?Eating and drinking ? ?Drink enough fluid to keep your urine pale yellow. This helps to keep you from becoming dehydrated. Try to drink more clear fluids, such as water. ?Do not drink alcohol. ?Limit your caffeine intake if told to do so by your health care provider. Check ingredients and nutrition facts to see if a food or beverage contains caffeine. ?Limit your salt (sodium) intake if told to do so by your health care provider. Check ingredients and nutrition facts to see if a food or beverage contains sodium. ?Activity ? ?Avoid making quick movements. ?Rise slowly from chairs and steady yourself until you feel okay. ?In the morning, first sit up on the side of the bed. When you feel okay, stand slowly while you hold onto something until you know that your balance is good. ?If you need to stand in one place for a long time, move your legs often. Tighten and relax the muscles in your legs while you are standing. ?Do not drive or use machinery if you feel dizzy. ?Avoid bending down if you feel dizzy. Place items in your home so that they are  easy for you to reach without leaning over. ?Lifestyle ?Do not use any products that contain nicotine or tobacco. These products include cigarettes, chewing tobacco, and vaping devices, such as e-cigarettes. If you need help quitting, ask your health care provider. ?Try to reduce your stress level by using methods such as yoga or meditation. Talk with your health care provider if you need help to manage your stress. ?General instructions ?Watch your dizziness for any changes. ?Take over-the-counter and prescription medicines only as told by your health care provider. Talk with your health care provider if you think that your dizziness is caused by a medicine that you are taking. ?Tell a friend or a family member that you are feeling dizzy. If he or she notices any changes in your behavior, have this person call your health care provider. ?Keep all follow-up visits. This is important. ?Contact a health care provider if: ?Your dizziness does not go away or you have new symptoms. ?Your dizziness or light-headedness gets worse. ?You feel nauseous. ?You have reduced hearing. ?You have a fever. ?You have neck pain or a stiff neck. ?Your dizziness leads to an injury or a fall. ?Get help right away if: ?You vomit or have diarrhea and are unable to eat or drink anything. ?You have problems talking, walking, swallowing, or using your arms, hands, or legs. ?You feel generally weak. ?You have any bleeding. ?You are not thinking clearly or you have trouble forming sentences. It may take a friend or family member to notice  this. ?You have chest pain, abdominal pain, shortness of breath, or sweating. ?Your vision changes or you develop a severe headache. ?These symptoms may represent a serious problem that is an emergency. Do not wait to see if the symptoms will go away. Get medical help right away. Call your local emergency services (911 in the [place] Do not drive yourself to the hospital. ?Summary ?Dizziness is a feeling of  unsteadiness or light-headedness. This condition can be caused by a number of things, including medicines, dehydration, or illness. ?Anyone can become dizzy, but dizziness is more common in older adults. ?Drink enough fluid to keep your urine pale yellow. Do not drink alcohol. ?Avoid making quick movements if you feel dizzy. Monitor your dizziness for any changes. ?This information is not intended to replace advice given to you by your health care provider. Make sure you discuss any questions you have with your health care provider. ?Document Revised: 03/29/2020 Document Reviewed: 03/29/2020 ?Elsevier Patient Education ? [place]. ? ?

## 2021-08-10 NOTE — Progress Notes (Signed)
Patient has eaten and taken medication today. ?Patient reports L side rotator cuff pain radiating down to the elbow. ?Patient reports dizzy spells over the past 3-5 weeks. Patient reports when standing and taking a step patient fills the dizzy spells. ?

## 2021-08-10 NOTE — Progress Notes (Signed)
? ?Established Patient Office Visit ? ?Subjective:  ?Patient ID: Christopher [name], male    DOB: 02-22-1966  Age: 56 y.o. MRN: [id number] ? ?CC:  ?Chief Complaint  ?Patient presents with  ? Dizziness  ? Shoulder Pain  ?  L  ? ? ?HPI ?Christopher [name] presents for increased episodes of dizziness, states that he has dizziness upon standing.  States that this has happened on and off in the past 2 weeks.  States that he has had 2 falls from the dizziness.  Denies injury or trauma from the falls.  States that he does not check his blood pressure when these episodes occur. ? ?States that he drinks 2-3 bottles of water a day. ? ?Also complains of pain on the left side of his neck, his left shoulder and into his arm.  States the pain is constant.  States that he believes that this is from his left-sided weakness from his stroke, states that he did not fall on his left side.  States that he did have rehab after his stroke, states that he continues to do his home rehab.  States that the pain feels like someone is pulling his arm.  States the pain has been worse this past month. ? ?States that he was seen by neurology on June 15, 2021.  Note from that visit ?ASSESSMENT AND PLAN ?56 y.o. year old male  ?  ?1.  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 ?-5 mm probable subacute infarct within the subcortical white matter of the mid-to-anterior right frontal lobe ?-Chronic lacunar infarcts  within the bilateral basal ganglia and left thalamus ?-Mild to moderate chronic small vessel ischemic disease ?-Mild generalized atrophy ?-2D echocardiogram: EF 55 to 60%, mild LVH, grade 1 diastolic dysfunction, no shunt or abnormality ?-A1c 5.8 ?-Etiology for stroke likely small vessel disease in the setting of smoking and cocaine abuse ?-Continue aspirin 81 mg daily ?-I do not detect any weakness on exam ?-Continue to see PCP for management of vascular risk factors and secondary stroke prevention  ?-Discussed he should report to the ED immediately with any new onset stroke symptoms, right arm weakness in December has completely resolved ?  ?2.  Hyperlipidemia ?-LDL 60, at goal, less than 70 ?-On Lipitor 20 mg ?  ?3.  Hypertension ?-On lisinopril-HCTZ ?-Goal <130/80, close to goal, encouraged to get BP machine at home to monitor ?  ?4.  Polysubstance abuse ?-History of crack cocaine, alcohol, cigarettes, marijuana ?-Discussed importance of smoking cessation, avoidance of drugs for stroke prevention  ?  ?Butler Denmark, AGNP-C, DNP 06/15/2021, 10:53 AM ?[facility] ?[address], [address] ?[place], [place] [address] ?[phone] ? ? ?Past Medical History:  ?Diagnosis Date  ? PUD (peptic ulcer disease) 2008  ? treated with meds  ? ? ?History reviewed. No pertinent surgical history. ? ?Family History  ?Problem Relation Age of Onset  ? Cancer Mother   ? Stroke Father   ? ? ?Social History  ? ?Socioeconomic History  ? Marital status: Divorced  ?  Spouse name: Not on file  ? Number of children: Not on file  ? Years of education: Not on file  ? Highest education level: Not on file  ?Occupational History  ? Not on file  ?Tobacco Use  ? Smoking status: Every Day  ?  Packs/day: 0.50  ?  Types: Cigarettes  ? Smokeless tobacco: Former  ?Vaping Use  ? Vaping Use: Never used  ?Substance and Sexual Activity  ? Alcohol use: Yes  ?  Comment: 2-3 "sparks" a day  ? Drug use: Yes  ?  Types: "Crack" cocaine  ?  Comment:  "only every once in awhile"  ? Sexual activity: Not Currently  ?Other Topics Concern  ? Not on file  ?Social History Narrative  ? Not on file  ? ?Social Determinants of Health  ? ?Financial Resource Strain: Not on file  ?Food Insecurity: Not on file  ?Transportation Needs: Not on file  ?Physical Activity: Not on file  ?Stress: Not on file  ?Social Connections: Not on file  ?Intimate Partner Violence: Not on file  ? ? ?Outpatient Medications Prior to Visit  ?Medication Sig Dispense Refill  ? acetaminophen (TYLENOL) 325 MG tablet Take 1-2 tablets (325-650 mg total) by mouth every 4 (four) hours as needed for mild pain.    ? aspirin 81 MG chewable tablet Chew 1 tablet (81 mg total) by mouth daily. 100 tablet 1  ? atorvastatin (LIPITOR) 20 MG tablet Take 1 tablet (20 mg total) by mouth daily. 90 tablet 1  ? lisinopril-hydrochlorothiazide (ZESTORETIC) 20-12.5 MG tablet Take 1 tablet by mouth daily. 90 tablet 1  ? clopidogrel (PLAVIX) 75 MG tablet Take 1 tablet (75 mg total) by mouth daily. 11 tablet 0  ? ?No facility-administered medications prior to visit.  ? ? ?No Known Allergies ? ?ROS ?Review of Systems  ?Constitutional:  Negative for chills and fever.  ?HENT: Negative.    ?Eyes: Negative.   ?Respiratory:  Negative for shortness of breath.   ?Cardiovascular:  Negative for chest pain and palpitations.  ?Gastrointestinal: Negative.   ?Endocrine: Negative.   ?Genitourinary: Negative.   ?Musculoskeletal:  Positive for arthralgias.  ?Skin: Negative.   ?Allergic/Immunologic: Negative.   ?Neurological:  Positive for dizziness and light-headedness. Negative for seizures, speech difficulty, weakness and headaches.  ?Hematological: Negative.   ?Psychiatric/Behavioral: Negative.    ? ?  ?Objective:  ?  ?Physical Exam ?Vitals and nursing note reviewed.  ?Constitutional:   ?   Appearance: Normal appearance.  ?HENT:  ?   Head: Normocephalic and atraumatic.  ?   Right Ear: External ear normal.  ?   Left Ear: External ear normal.  ?    Nose: Nose normal.  ?   Mouth/Throat:  ?   Mouth: Mucous membranes are moist.  ?   Pharynx: Oropharynx is clear.  ?Eyes:  ?   Extraocular Movements: Extraocular movements intact.  ?   Conjunctiva/sclera: Conjunctivae normal.  ?   Pupils: Pupils are equal, round, and reactive to light.  ?Cardiovascular:  ?   Rate and Rhythm: Normal rate and regular rhythm.  ?   Pulses: Normal pulses.  ?   Heart sounds: Normal heart sounds.  ?Pulmonary:  ?   Effort: Pulmonary effort is normal.  ?   Breath sounds: Normal breath sounds.  ?Musculoskeletal:  ?   Right shoulder: Normal.  ?   Left shoulder: Bony tenderness present. No swelling. Decreased range of motion.  ?   Right hand: Normal.  ?   Left  hand: No swelling. Normal range of motion. Normal strength.  ?   Cervical back: Normal range of motion and neck supple.  ?Skin: ?   General: Skin is warm and dry.  ?Neurological:  ?   General: No focal deficit present.  ?   Mental Status: He is alert and oriented to person, place, and time.  ?   Cranial Nerves: No cranial nerve deficit.  ?   Sensory: No sensory deficit.  ?Psychiatric:     ?   Mood and Affect: Mood normal.     ?   Behavior: Behavior normal.     ?   Thought Content: Thought content normal.     ?   Judgment: Judgment normal.  ? ? ?BP 108/62 (BP Location: Left Arm, Patient Position: Sitting, Cuff Size: Normal)   Pulse 76   Temp 98.9 ?F (37.2 ?C) (Oral)   Resp 18   Ht 5\' 9"  (1.753 m)   Wt 132 lb (59.9 kg)   SpO2 97%   BMI 19.49 kg/m?  ?Wt Readings from Last 3 Encounters:  ?08/10/21 132 lb (59.9 kg)  ?06/15/21 138 lb 8 oz (62.8 kg)  ?06/09/21 137 lb 9.6 oz (62.4 kg)  ? ? ? ?Health Maintenance Due  ?Topic Date Due  ? COVID-19 Vaccine (1) Never done  ? Hepatitis C Screening  Never done  ? ? ?There are no preventive care reminders to display for this patient. ? ?No results found for: TSH ?Lab Results  ?Component Value Date  ? WBC 7.3 01/17/2021  ? HGB 14.9 01/17/2021  ? HCT 45.5 01/17/2021  ? MCV 90.3 01/17/2021  ? PLT 287  01/17/2021  ? ?Lab Results  ?Component Value Date  ? NA 134 (L) 01/20/2021  ? K 4.3 01/20/2021  ? CO2 26 01/20/2021  ? GLUCOSE 95 01/20/2021  ? BUN 19 01/20/2021  ? CREATININE 1.07 01/20/2021  ? BILITOT 0.4 09/

## 2021-08-11 DIAGNOSIS — Z8673 Personal history of transient ischemic attack (TIA), and cerebral infarction without residual deficits: Secondary | ICD-10-CM | POA: Insufficient documentation

## 2021-08-11 LAB — CBC WITH DIFFERENTIAL/PLATELET
Basophils Absolute: 0.1 10*3/uL (ref 0.0–0.2)
Basos: 1 %
EOS (ABSOLUTE): 0.1 10*3/uL (ref 0.0–0.4)
Eos: 1 %
Hematocrit: 41.7 % (ref 37.5–51.0)
Hemoglobin: 14.1 g/dL (ref 13.0–17.7)
Immature Grans (Abs): 0 10*3/uL (ref 0.0–0.1)
Immature Granulocytes: 0 %
Lymphocytes Absolute: 2.1 10*3/uL (ref 0.7–3.1)
Lymphs: 18 %
MCH: 30.1 pg (ref 26.6–33.0)
MCHC: 33.8 g/dL (ref 31.5–35.7)
MCV: 89 fL (ref 79–97)
Monocytes Absolute: 0.8 10*3/uL (ref 0.1–0.9)
Monocytes: 7 %
Neutrophils Absolute: 8.3 10*3/uL — ABNORMAL HIGH (ref 1.4–7.0)
Neutrophils: 73 %
Platelets: 362 10*3/uL (ref 150–450)
RBC: 4.68 x10E6/uL (ref 4.14–5.80)
RDW: 11.9 % (ref 11.6–15.4)
WBC: 11.4 10*3/uL — ABNORMAL HIGH (ref 3.4–10.8)

## 2021-08-11 LAB — COMP. METABOLIC PANEL (12)
AST: 16 IU/L (ref 0–40)
Albumin/Globulin Ratio: 2.1 (ref 1.2–2.2)
Albumin: 4.9 g/dL (ref 3.8–4.9)
Alkaline Phosphatase: 70 IU/L (ref 44–121)
BUN/Creatinine Ratio: 18 (ref 9–20)
BUN: 37 mg/dL — ABNORMAL HIGH (ref 6–24)
Bilirubin Total: 0.2 mg/dL (ref 0.0–1.2)
Calcium: 10.3 mg/dL — ABNORMAL HIGH (ref 8.7–10.2)
Chloride: 96 mmol/L (ref 96–106)
Creatinine, Ser: 2.11 mg/dL — ABNORMAL HIGH (ref 0.76–1.27)
Globulin, Total: 2.3 g/dL (ref 1.5–4.5)
Glucose: 100 mg/dL — ABNORMAL HIGH (ref 70–99)
Potassium: 5.1 mmol/L (ref 3.5–5.2)
Sodium: 137 mmol/L (ref 134–144)
Total Protein: 7.2 g/dL (ref 6.0–8.5)
eGFR: 36 mL/min/{1.73_m2} — ABNORMAL LOW (ref >59)

## 2021-08-11 LAB — TSH: TSH: 2.63 u[IU]/mL (ref 0.450–4.500)

## 2021-08-11 LAB — PSA: Prostate Specific Ag, Serum: 0.2 ng/mL (ref 0.0–4.0)

## 2021-08-11 LAB — HCV AB W REFLEX TO QUANT PCR: HCV Ab: NONREACTIVE

## 2021-08-11 LAB — HCV INTERPRETATION

## 2021-08-15 ENCOUNTER — Telehealth: Payer: Self-pay | Admitting: *Deleted

## 2021-08-15 NOTE — Telephone Encounter (Signed)
-----   Message from Roney Jaffe, PA-C sent at 08/15/2021 12:00 PM EDT ----- ?Please call patient and let him know that his x-ray of his shoulder did not show any acute changes.  Referral will be started for further evaluation by orthopedics. ?

## 2021-08-15 NOTE — Addendum Note (Signed)
Addended by: Roney Jaffe on: 08/15/2021 12:00 PM ? ? Modules accepted: Orders ? ?

## 2021-08-15 NOTE — Telephone Encounter (Signed)
Patient verified DOB ?Patient is aware of ortho referral being placed. Patient missed call at 2:20 and will return the call in the am to be scheduled. ?

## 2021-08-16 ENCOUNTER — Telehealth: Payer: Self-pay | Admitting: *Deleted

## 2021-08-16 ENCOUNTER — Telehealth: Payer: Self-pay | Admitting: Family Medicine

## 2021-08-16 NOTE — Telephone Encounter (Signed)
Patient verified DOB ?Patient was provided the number for Ortho to contact [facility] back for scheduling. Patient is also scheduled with [facility] 2wk FU 4/18 at 1pm. ?

## 2021-08-16 NOTE — Telephone Encounter (Signed)
-----   Message from Roney Jaffe, [place] sent at 08/15/2021 12:03 PM EDT ----- ?Please call patient and let him know that his thyroid function and liver function are within normal limits.  His kidney function has decreased, this may be due to one of his medications.  Please have patient return to clinic this week, can also do virtual visit if necessary, to discuss medication change. ? ?His screening for prostate cancer, and hepatitis C were negative. ?

## 2021-08-16 NOTE — Telephone Encounter (Signed)
Copied from [place] [id number]. Topic: General - Other ?>> Aug 15, 2021  2:31 PM Tessa Lerner A wrote: ?Reason for CRM: The patient would like to review their imaging results from 08/10/21 ? ?Please contact further when possible ?

## 2021-08-16 NOTE — Telephone Encounter (Signed)
Patient scheduled to FU with [facility] 4/18. ?

## 2021-08-23 ENCOUNTER — Encounter: Payer: Self-pay | Admitting: Physician Assistant

## 2021-08-23 ENCOUNTER — Ambulatory Visit (INDEPENDENT_AMBULATORY_CARE_PROVIDER_SITE_OTHER): Payer: Medicaid Other | Admitting: Physician Assistant

## 2021-08-23 VITALS — BP 100/61 | HR 87 | Temp 98.2°F | Resp 18 | Ht 69.0 in | Wt 132.0 lb

## 2021-08-23 DIAGNOSIS — Z8673 Personal history of transient ischemic attack (TIA), and cerebral infarction without residual deficits: Secondary | ICD-10-CM

## 2021-08-23 DIAGNOSIS — M25512 Pain in left shoulder: Secondary | ICD-10-CM | POA: Diagnosis not present

## 2021-08-23 DIAGNOSIS — N179 Acute kidney failure, unspecified: Secondary | ICD-10-CM | POA: Diagnosis not present

## 2021-08-23 DIAGNOSIS — I10 Essential (primary) hypertension: Secondary | ICD-10-CM

## 2021-08-23 DIAGNOSIS — K219 Gastro-esophageal reflux disease without esophagitis: Secondary | ICD-10-CM

## 2021-08-23 DIAGNOSIS — R42 Dizziness and giddiness: Secondary | ICD-10-CM

## 2021-08-23 MED ORDER — OMEPRAZOLE 20 MG PO CPDR
20.0000 mg | DELAYED_RELEASE_CAPSULE | Freq: Every day | ORAL | 3 refills | Status: DC
Start: 2021-08-23 — End: 2021-09-21

## 2021-08-23 MED ORDER — LISINOPRIL 20 MG PO TABS: 20.0000 mg | ORAL_TABLET | Freq: Every day | ORAL | 1 refills | Status: DC

## 2021-08-23 NOTE — Progress Notes (Signed)
? ?Established Patient Office Visit ? ?Subjective   ?Patient ID: Christopher [name], male    DOB: 24-Apr-1966  Age: 56 y.o. MRN: [id number] ? ?Chief Complaint  ?Patient presents with  ? Medication Management  ? ? ?States that he has not been able to check his blood pressure at home, is still working on obtaining a blood pressure cuff.  States that he did have 1 episode of dizziness since his last office visit. ? ?States that he has been having abdominal discomfort after eating, states he feels as his food is still sitting on his stomach, states that he will then feel like he has a gas pocket in his abdomen.  Does not heartburn as well.  States that he has been taking Tums with relief.  Does endorse significant history of GERD with previous stomach ulcer back in 2008. ? ? ? ? ?Past Medical History:  ?Diagnosis Date  ? PUD (peptic ulcer disease) 2008  ? treated with meds  ? ?History reviewed. No pertinent surgical history. ?Social History  ? ?Tobacco Use  ? Smoking status: Every Day  ?  Packs/day: 0.50  ?  Types: Cigarettes  ? Smokeless tobacco: Former  ?Vaping Use  ? Vaping Use: Never used  ?Substance Use Topics  ? Alcohol use: Yes  ?  Comment: 2-3 "sparks" a day  ? Drug use: Yes  ?  Types: "Crack" cocaine  ?  Comment: "only every once in awhile"  ? ?Family History  ?Problem Relation Age of Onset  ? Cancer Mother   ? Stroke Father   ? ?No Known Allergies ?  ? ?Review of Systems  ?Constitutional: Negative.   ?HENT: Negative.    ?Eyes: Negative.   ?Respiratory:  Negative for shortness of breath.   ?Cardiovascular:  Negative for chest pain.  ?Gastrointestinal:  Positive for heartburn. Negative for abdominal pain, nausea and vomiting.  ?Genitourinary: Negative.   ?Musculoskeletal: Negative.   ?Skin: Negative.   ?Neurological:  Positive for dizziness. Negative for headaches.  ?Endo/Heme/Allergies: Negative.   ?Psychiatric/Behavioral: Negative.    ? ?  ?Objective:  ?  ? ?BP 100/61 (BP Location: Left Arm, Patient Position:  Sitting, Cuff Size: Normal)   Pulse 87   Temp 98.2 ?F (36.8 ?C) (Oral)   Resp 18   Ht 5\' 9"  (1.753 m)   Wt 132 lb (59.9 kg)   SpO2 99%   BMI 19.49 kg/m?  ?BP Readings from Last 3 Encounters:  ?08/23/21 100/61  ?08/10/21 108/62  ?06/15/21 140/66  ? ?Wt Readings from Last 3 Encounters:  ?08/23/21 132 lb (59.9 kg)  ?08/10/21 132 lb (59.9 kg)  ?06/15/21 138 lb 8 oz (62.8 kg)  ? ?  ? ?Physical Exam ?Vitals and nursing note reviewed.  ?Constitutional:   ?   Appearance: Normal appearance.  ?HENT:  ?   Head: Normocephalic and atraumatic.  ?   Right Ear: External ear normal.  ?   Left Ear: External ear normal.  ?   Nose: Nose normal.  ?   Mouth/Throat:  ?   Mouth: Mucous membranes are moist.  ?   Pharynx: Oropharynx is clear.  ?Eyes:  ?   Extraocular Movements: Extraocular movements intact.  ?   Conjunctiva/sclera: Conjunctivae normal.  ?   Pupils: Pupils are equal, round, and reactive to light.  ?Cardiovascular:  ?   Rate and Rhythm: Normal rate and regular rhythm.  ?   Pulses: Normal pulses.  ?   Heart sounds: Normal heart sounds.  ?Pulmonary:  ?  Effort: Pulmonary effort is normal.  ?   Breath sounds: Normal breath sounds.  ?Abdominal:  ?   Tenderness: There is no abdominal tenderness.  ?Musculoskeletal:     ?   General: Normal range of motion.  ?   Cervical back: Normal range of motion and neck supple.  ?Skin: ?   General: Skin is warm and dry.  ?Neurological:  ?   General: No focal deficit present.  ?   Mental Status: He is alert and oriented to person, place, and time.  ?Psychiatric:     ?   Mood and Affect: Mood normal.     ?   Behavior: Behavior normal.     ?   Thought Content: Thought content normal.     ?   Judgment: Judgment normal.  ? ? ? ?  ?Assessment & Plan:  ? ?Problem List Items Addressed This Visit   ? ?  ? Cardiovascular and Mediastinum  ? Essential hypertension  ? Relevant Medications  ? lisinopril (ZESTRIL) 20 MG tablet  ?  ? Other  ? History of CVA (cerebrovascular accident)  ? ?Other Visit  Diagnoses   ? ? Dizziness and giddiness    -  Primary  ? Relevant Medications  ? lisinopril (ZESTRIL) 20 MG tablet  ? Acute pain of left shoulder      ? AKI (acute kidney injury) ([place])      ? Relevant Medications  ? lisinopril (ZESTRIL) 20 MG tablet  ? Gastroesophageal reflux disease without esophagitis      ? Relevant Medications  ? omeprazole (PRILOSEC) 20 MG capsule  ? ?  ? ?1. Dizziness and giddiness ?Patient did have reduced GFR, this is since his hospitalization and starting hydrochlorothiazide.  Change to lisinopril 20 mg.  Patient encouraged to check blood pressure at home, keep a written log and have available for all office visits.  Red flags given for prompt reevaluation. ? ?Patient to follow-up with mobile team in 2 weeks for further evaluation. ?- lisinopril (ZESTRIL) 20 MG tablet; Take 1 tablet (20 mg total) by mouth daily.  Dispense: 30 tablet; Refill: 1 ? ?2. AKI (acute kidney injury) ([place]) ? ?- lisinopril (ZESTRIL) 20 MG tablet; Take 1 tablet (20 mg total) by mouth daily.  Dispense: 30 tablet; Refill: 1 ? ?3. Essential hypertension ? ? ?4. Acute pain of left shoulder ?Referral already in progress to orthopedics ? ?5. Gastroesophageal reflux disease without esophagitis ?Trial Prilosec, patient education given on supportive care, lifestyle modifications. ?- omeprazole (PRILOSEC) 20 MG capsule; Take 1 capsule (20 mg total) by mouth daily.  Dispense: 30 capsule; Refill: 3 ? ?6. History of CVA (cerebrovascular accident) ? ? ? ?I have reviewed the patient's medical history (PMH, PSH, Social History, Family History, Medications, and allergies) , and have been updated if relevant. I spent 30 minutes reviewing chart and  face to face time with patient. ? ? ? ?Return in about 2 weeks (around 09/06/2021) for with [facility].  ? ? ?Johnte Portnoy S Mayers, PA-C ? ?

## 2021-08-23 NOTE — Progress Notes (Signed)
Patient has eaten and taken medication today. ?Patient reports epigastric pain after eating. Tums provides relief. ? ?

## 2021-08-23 NOTE — Patient Instructions (Signed)
To help with your stomach, you are going to start taking omeprazole once daily in the morning. ? ?We are changing your blood pressure medication, instead of taking the combination blood pressure medication you will just take lisinopril.  The new prescription was sent to your pharmacy.  I do encourage you to check your blood pressure when you are able, keep a written log and have available for all office visits.  It is very important that you increase your water intake, you should drink at least 64 ounces a day. ? ?We will call you to schedule follow-up with the mobile team for 2 weeks from now  ? ?Kennieth Rad, PA-C ?Physician Assistant ?[place] ?[email or web address] ? ? ?Food Choices for Gastroesophageal Reflux Disease, Adult ?When you have gastroesophageal reflux disease (GERD), the foods you eat and your eating habits are very important. Choosing the right foods can help ease the discomfort of GERD. Consider working with a dietitian to help you make healthy food choices. ?What are tips for following this plan? ?Reading food labels ?Look for foods that are low in saturated fat. Foods that have less than 5% of daily value (DV) of fat and 0 g of trans fats may help with your symptoms. ?Cooking ?Cook foods using methods other than frying. This may include baking, steaming, grilling, or broiling. These are all methods that do not need a lot of fat for cooking. ?To add flavor, try to use herbs that are low in spice and acidity. ?Meal planning ? ?Choose healthy foods that are low in fat, such as fruits, vegetables, whole grains, low-fat dairy products, lean meats, fish, and poultry. ?Eat frequent, small meals instead of three large meals each day. Eat your meals slowly, in a relaxed setting. Avoid bending over or lying down until 2-3 hours after eating. ?Limit high-fat foods such as fatty meats or fried foods. ?Limit your intake of fatty foods, such as oils,  butter, and shortening. ?Avoid the following as told by your health care provider: ?Foods that cause symptoms. These may be different for different people. Keep a food diary to keep track of foods that cause symptoms. ?Alcohol. ?Drinking large amounts of liquid with meals. ?Eating meals during the 2-3 hours before bed. ?Lifestyle ?Maintain a healthy weight. Ask your health care provider what weight is healthy for you. If you need to lose weight, work with your health care provider to do so safely. ?Exercise for at least 30 minutes on 5 or more days each week, or as told by your health care provider. ?Avoid wearing clothes that fit tightly around your waist and chest. ?Do not use any products that contain nicotine or tobacco. These products include cigarettes, chewing tobacco, and vaping devices, such as e-cigarettes. If you need help quitting, ask your health care provider. ?Sleep with the head of your bed raised. Use a wedge under the mattress or blocks under the bed frame to raise the head of the bed. ?Chew sugar-free gum after mealtimes. ?What foods should I eat? ? ?Eat a healthy, well-balanced diet of fruits, vegetables, whole grains, low-fat dairy products, lean meats, fish, and poultry. Each person is different. Foods that may trigger symptoms in one person may not trigger any symptoms in another person. Work with your health care provider to identify foods that are safe for you. ?The items listed above may not be a complete list of recommended foods and beverages. Contact a dietitian for more information. ?What foods should I avoid? ?Limiting some of  these foods may help manage the symptoms of GERD. Everyone is different. Consult a dietitian or your health care provider to help you identify the exact foods to avoid, if any. ?Fruits ?Any fruits prepared with added fat. Any fruits that cause symptoms. For some people this may include citrus fruits, such as oranges, grapefruit, pineapple, and  lemons. ?Vegetables ?Deep-fried vegetables. [place] fries. Any vegetables prepared with added fat. Any vegetables that cause symptoms. For some people, this may include tomatoes and tomato products, chili peppers, onions and garlic, and horseradish. ?Grains ?Pastries or quick breads with added fat. ?Meats and other proteins ?High-fat meats, such as fatty beef or pork, hot dogs, ribs, ham, sausage, salami, and bacon. Fried meat or protein, including fried fish and fried chicken. Nuts and nut butters, in large amounts. ?Dairy ?Whole milk and chocolate milk. Sour cream. Cream. Ice cream. Cream cheese. Milkshakes. ?Fats and oils ?Butter. Margarine. Shortening. Ghee. ?Beverages ?Coffee and tea, with or without caffeine. Carbonated beverages. Sodas. Energy drinks. Fruit juice made with acidic fruits, such as orange or grapefruit. Tomato juice. Alcoholic drinks. ?Sweets and desserts ?Chocolate and cocoa. Donuts. ?Seasonings and condiments ?Pepper. Peppermint and spearmint. Added salt. Any condiments, herbs, or seasonings that cause symptoms. For some people, this may include curry, hot sauce, or vinegar-based salad dressings. ?The items listed above may not be a complete list of foods and beverages to avoid. Contact a dietitian for more information. ?Questions to ask your health care provider ?Diet and lifestyle changes are usually the first steps that are taken to manage symptoms of GERD. If diet and lifestyle changes do not improve your symptoms, talk with your health care provider about taking medicines. ?Where to find more information ?International Foundation for Gastrointestinal Disorders: aboutgerd.org ?Summary ?When you have gastroesophageal reflux disease (GERD), food and lifestyle choices may be very helpful in easing the discomfort of GERD. ?Eat frequent, small meals instead of three large meals each day. Eat your meals slowly, in a relaxed setting. Avoid bending over or lying down until 2-3 hours after  eating. ?Limit high-fat foods such as fatty meats or fried foods. ?This information is not intended to replace advice given to you by your health care provider. Make sure you discuss any questions you have with your health care provider. ?Document Revised: 11/03/2019 Document Reviewed: 11/03/2019 ?Elsevier Patient Education ? [place]. ? ?

## 2021-08-24 DIAGNOSIS — K219 Gastro-esophageal reflux disease without esophagitis: Secondary | ICD-10-CM | POA: Insufficient documentation

## 2021-08-25 ENCOUNTER — Ambulatory Visit: Payer: Medicaid Other | Admitting: Orthopaedic Surgery

## 2021-08-25 ENCOUNTER — Encounter: Payer: Self-pay | Admitting: Orthopaedic Surgery

## 2021-08-25 VITALS — Ht 69.0 in | Wt 132.0 lb

## 2021-08-25 DIAGNOSIS — G8929 Other chronic pain: Secondary | ICD-10-CM | POA: Diagnosis not present

## 2021-08-25 DIAGNOSIS — M25512 Pain in left shoulder: Secondary | ICD-10-CM

## 2021-08-25 MED ORDER — BUPIVACAINE HCL 0.25 % IJ SOLN
2.0000 mL | INTRAMUSCULAR | Status: AC | PRN
  Administered 2021-08-25: 2 mL via INTRA_ARTICULAR

## 2021-08-25 MED ORDER — METHYLPREDNISOLONE ACETATE 40 MG/ML IJ SUSP
40.0000 mg | INTRAMUSCULAR | Status: AC | PRN
  Administered 2021-08-25: 40 mg via INTRA_ARTICULAR

## 2021-08-25 MED ORDER — LIDOCAINE HCL 2 % IJ SOLN
2.0000 mL | INTRAMUSCULAR | Status: AC | PRN
  Administered 2021-08-25: 2 mL

## 2021-08-25 NOTE — Progress Notes (Signed)
? ?Office Visit Note ?  ?Patient: Christopher [name]           ?Date of Birth: 1965-06-27           ?MRN: [id number] ?Visit Date: 08/25/2021 ?             ?Requested by: Dorna Mai, MD ?[phone] ?[place],  [place] [address] ?PCP: Dorna Mai, MD ? ? ?Assessment & Plan: ?Visit Diagnoses:  ?1. Chronic left shoulder pain   ? ? ?Plan: Impression is left shoulder rotator cuff tendinitis.  Today, we discussed proceeding with subacromial cortisone injection for which she is agreeable to.  If his symptoms do not improve over the next several weeks he will call we will get an MRI.  Call with concerns or questions in the meantime. ? ?Follow-Up Instructions: Return if symptoms worsen or fail to improve.  ? ?Orders:  ?Orders Placed This Encounter  ?Procedures  ? Large Joint Inj: L subacromial bursa  ? ?No orders of the defined types were placed in this encounter. ? ? ? ? Procedures: ?Large Joint Inj: L subacromial bursa on 08/25/2021 9:51 AM ?Indications: pain ?Details: 22 G needle ?Medications: 2 mL lidocaine 2 %; 2 mL bupivacaine 0.25 %; 40 mg methylPREDNISolone acetate 40 MG/ML ?Outcome: tolerated well, no immediate complications ?Patient was prepped and draped in the usual sterile fashion.  ? ? ? ? ?Clinical Data: ?No additional findings. ? ? ?Subjective: ?Chief Complaint  ?Patient presents with  ? Left Shoulder - Pain  ? ? ?HPI patient is a pleasant 56 year old gentleman who comes in with left shoulder pain.  He notes that he sustained a stroke about 6 to 7 months ago which affected his left side.  He has been working on exercises at home ever since the stroke.  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  Left shoulder pain he is having began about 2 months ago.  Pain is primarily to the deltoid but does radiate into the entire shoulder.  He describes this as a constant ache worse with forward flexion, abduction and sleeping on the left side.   He has been taking Tylenol without relief.  He does note paresthesias to both upper extremities.  No previous injection or surgery to the left shoulder. ? ?Review of Systems as detailed in HPI.  All others reviewed are negative. ? ? ?Objective: ?Vital Signs: Ht 5\' 9"  (1.753 m)   Wt 132 lb (59.9 kg)   BMI 19.49 kg/m?  ? ?Physical Exam well-developed well-nourished gentleman no acute distress.  Alert and oriented x3. ? ?Ortho Exam left shoulder exam shows forward flexion to approximately 150 degrees.  Near full external rotation.  Internal rotation to his back pocket.  Positive empty can test.  5 out of 5 strength.  He is neurovascular intact distally. ? ?Specialty Comments:  ?No specialty comments available. ? ?Imaging: ?X-rays of the left shoulder reviewed by me in [facility] showed no acute or structural abnormalities ? ? ?PMFS History: ?Patient Active Problem List  ? Diagnosis Date Noted  ? Gastroesophageal reflux disease without esophagitis 08/24/2021  ? History of CVA (cerebrovascular accident) 08/11/2021  ? Dyslipidemia 01/24/2021  ? Essential hypertension   ? Hyponatremia   ? Polysubstance abuse ([place])   ? Cerebral thrombosis with cerebral infarction 01/11/2021  ? Acute CVA (cerebrovascular accident) ([place]) 01/11/2021  ? Right sided weakness 01/10/2021  ? Tobacco abuse 01/10/2021  ? Cocaine  abuse ([place]) 01/10/2021  ? Leukocytosis 01/10/2021  ? Cellulitis and abscess of upper arm and forearm 11/13/2012  ? Episodic tobacco abuse 11/13/2012  ? ?Past Medical History:  ?Diagnosis Date  ? PUD (peptic ulcer disease) 2008  ? treated with meds  ?  ?Family History  ?Problem Relation Age of Onset  ? Cancer Mother   ? Stroke Father   ?  ?History reviewed. No pertinent surgical history. ?Social History  ? ?Occupational History  ? Not on file  ?Tobacco Use  ? Smoking status: Every Day  ?  Packs/day: 0.50  ?  Types: Cigarettes  ? Smokeless tobacco: Former  ?Vaping Use  ? Vaping Use: Never used  ?Substance and Sexual Activity  ?  Alcohol use: Yes  ?  Comment: 2-3 "sparks" a day  ? Drug use: Yes  ?  Types: "Crack" cocaine  ?  Comment: "only every once in awhile"  ? Sexual activity: Not Currently  ? ? ? ? ? ? ?

## 2021-09-02 ENCOUNTER — Telehealth: Payer: Self-pay | Admitting: *Deleted

## 2021-09-02 NOTE — Telephone Encounter (Signed)
Patient verified DOB ?Patient is scheduled for 2wk FU 5/3 @1 :40. ?

## 2021-09-07 ENCOUNTER — Encounter: Payer: Self-pay | Admitting: Physician Assistant

## 2021-09-07 ENCOUNTER — Ambulatory Visit (INDEPENDENT_AMBULATORY_CARE_PROVIDER_SITE_OTHER): Payer: Medicaid Other | Admitting: Physician Assistant

## 2021-09-07 VITALS — BP 131/71 | HR 98 | Temp 98.7°F | Resp 18 | Ht 69.0 in | Wt 132.0 lb

## 2021-09-07 DIAGNOSIS — N179 Acute kidney failure, unspecified: Secondary | ICD-10-CM | POA: Diagnosis not present

## 2021-09-07 DIAGNOSIS — R42 Dizziness and giddiness: Secondary | ICD-10-CM

## 2021-09-07 DIAGNOSIS — I10 Essential (primary) hypertension: Secondary | ICD-10-CM | POA: Diagnosis not present

## 2021-09-07 MED ORDER — LISINOPRIL 20 MG PO TABS: 20.0000 mg | ORAL_TABLET | Freq: Every day | ORAL | 1 refills | Status: DC

## 2021-09-07 NOTE — Progress Notes (Signed)
Patient has taken medication today. ?Patient has eaten today. ?Patient reports stomach pain is much better with omeprazole and patient reports no dizzy spells with new BP change. ?

## 2021-09-07 NOTE — Progress Notes (Signed)
? ?Established Patient Office Visit ? ?Subjective   ?Patient ID: Christopher [name], male    DOB: [birth year]  Age: 56 y.o. MRN: [id number] ? ?Chief Complaint  ?Patient presents with  ? Follow-up  ? ? ?States that he has not had any further episodes of dizziness since switching to lisinopril.  Is still working on getting a blood pressure cuff to be able to check his blood pressure at home. ? ?States that he is taking the omeprazole with the relief of abdominal discomfort. ? ?No new concerns at this time ? ? ?Past Medical History:  ?Diagnosis Date  ? PUD (peptic ulcer disease) 2008  ? treated with meds  ? ?Social History  ? ?Socioeconomic History  ? Marital status: Divorced  ?  Spouse name: Not on file  ? Number of children: Not on file  ? Years of education: Not on file  ? Highest education level: Not on file  ?Occupational History  ? Not on file  ?Tobacco Use  ? Smoking status: Every Day  ?  Packs/day: 0.50  ?  Types: Cigarettes  ? Smokeless tobacco: Former  ?Vaping Use  ? Vaping Use: Never used  ?Substance and Sexual Activity  ? Alcohol use: Yes  ?  Comment: 2-3 "sparks" a day  ? Drug use: Yes  ?  Types: "Crack" cocaine  ?  Comment: "only every once in awhile"  ? Sexual activity: Not Currently  ?Other Topics Concern  ? Not on file  ?Social History Narrative  ? Not on file  ? ?Social Determinants of Health  ? ?Financial Resource Strain: Not on file  ?Food Insecurity: Not on file  ?Transportation Needs: Not on file  ?Physical Activity: Not on file  ?Stress: Not on file  ?Social Connections: Not on file  ?Intimate Partner Violence: Not on file  ? ?Family History  ?Problem Relation Age of Onset  ? Cancer Mother   ? Stroke Father   ? ?No Known Allergies ?  ? ?Review of Systems  ?Constitutional: Negative.   ?HENT: Negative.    ?Eyes: Negative.   ?Respiratory:  Negative for shortness of breath.   ?Cardiovascular:  Negative for chest pain.  ?Gastrointestinal:  Negative for abdominal pain, heartburn and nausea.   ?Genitourinary: Negative.   ?Musculoskeletal: Negative.   ?Skin: Negative.   ?Neurological:  Negative for dizziness.  ?Endo/Heme/Allergies: Negative.   ?Psychiatric/Behavioral: Negative.    ? ?  ?Objective:  ?  ? ?BP 131/71 (BP Location: Right Arm, Patient Position: Sitting, Cuff Size: Normal)   Pulse 98   Temp 98.7 ?F (37.1 ?C) (Oral)   Resp 18   Ht 5\' 9"  (1.753 m)   Wt 132 lb (59.9 kg)   SpO2 97%   BMI 19.49 kg/m?  ?BP Readings from Last 3 Encounters:  ?09/07/21 131/71  ?08/23/21 100/61  ?08/10/21 108/62  ? ?Wt Readings from Last 3 Encounters:  ?09/07/21 132 lb (59.9 kg)  ?08/25/21 132 lb (59.9 kg)  ?08/23/21 132 lb (59.9 kg)  ? ?  ? ?Physical Exam ?Vitals and nursing note reviewed.  ?Constitutional:   ?   Appearance: Normal appearance.  ?HENT:  ?   Head: Normocephalic and atraumatic.  ?   Right Ear: External ear normal.  ?   Left Ear: External ear normal.  ?   Nose: Nose normal.  ?   Mouth/Throat:  ?   Mouth: Mucous membranes are moist.  ?   Pharynx: Oropharynx is clear.  ?Eyes:  ?   Extraocular Movements:  Extraocular movements intact.  ?   Conjunctiva/sclera: Conjunctivae normal.  ?   Pupils: Pupils are equal, round, and reactive to light.  ?Cardiovascular:  ?   Rate and Rhythm: Regular rhythm.  ?   Pulses: Normal pulses.  ?   Heart sounds: Normal heart sounds.  ?Pulmonary:  ?   Effort: Pulmonary effort is normal.  ?   Breath sounds: Normal breath sounds.  ?Musculoskeletal:     ?   General: Normal range of motion.  ?   Cervical back: Normal range of motion and neck supple.  ?Skin: ?   General: Skin is warm and dry.  ?Neurological:  ?   General: No focal deficit present.  ?   Mental Status: He is alert and oriented to person, place, and time.  ?Psychiatric:     ?   Mood and Affect: Mood normal.     ?   Thought Content: Thought content normal.     ?   Judgment: Judgment normal.  ? ? ?  ?Assessment & Plan:  ? ?Problem List Items Addressed This Visit   ? ?  ? Cardiovascular and Mediastinum  ? Essential  hypertension  ? Relevant Medications  ? lisinopril (ZESTRIL) 20 MG tablet  ? Other Relevant Orders  ? Basic metabolic panel  ?  ? Genitourinary  ? AKI (acute kidney injury) ([place]) - Primary  ? Relevant Orders  ? Basic metabolic panel  ? ?Other Visit Diagnoses   ? ? Dizziness and giddiness      ? ?  ? ?1. AKI (acute kidney injury) ([place]) ?Continue current regimen, patient encouraged to check blood pressure at home, keep a written log and have available for all office visits.  Red flags given for prompt reevaluation. ?- Basic metabolic panel ? ?2. Essential hypertension ? ?- Basic metabolic panel ?- lisinopril (ZESTRIL) 20 MG tablet; Take 1 tablet (20 mg total) by mouth daily.  Dispense: 90 tablet; Refill: 1 ? ?3. Dizziness and giddiness ?Currently resolved ? ? ? ?I have reviewed the patient's medical history (PMH, PSH, Social History, Family History, Medications, and allergies) , and have been updated if relevant. I spent 20 minutes reviewing chart and  face to face time with patient. ? ? ?Return if symptoms worsen or fail to improve.  ? ? ?Lido Maske S Mayers, PA-C ? ?

## 2021-09-07 NOTE — Patient Instructions (Signed)
I sent a refill to your pharmacy of the lisinopril.  We will call you with today's lab results. ? ?Please let us know if there is anything else we can do for you. ? ?Kennieth Rad, PA-C ?Physician Assistant ?[place] ?[email or web address] ? ? ?Health Maintenance, Male ?Adopting a healthy lifestyle and getting preventive care are important in promoting health and wellness. Ask your health care provider about: ?The right schedule for you to have regular tests and exams. ?Things you can do on your own to prevent diseases and keep yourself healthy. ?What should I know about diet, weight, and exercise? ?Eat a healthy diet ? ?Eat a diet that includes plenty of vegetables, fruits, low-fat dairy products, and lean protein. ?Do not eat a lot of foods that are high in solid fats, added sugars, or sodium. ?Maintain a healthy weight ?Body mass index (BMI) is a measurement that can be used to identify possible weight problems. It estimates body fat based on height and weight. Your health care provider can help determine your BMI and help you achieve or maintain a healthy weight. ?Get regular exercise ?Get regular exercise. This is one of the most important things you can do for your health. Most adults should: ?Exercise for at least 150 minutes each week. The exercise should increase your heart rate and make you sweat (moderate-intensity exercise). ?Do strengthening exercises at least twice a week. This is in addition to the moderate-intensity exercise. ?Spend less time sitting. Even light physical activity can be beneficial. ?Watch cholesterol and blood lipids ?Have your blood tested for lipids and cholesterol at 57 years of age, then have this test every 5 years. ?You may need to have your cholesterol levels checked more often if: ?Your lipid or cholesterol levels are high. ?You are older than 57 years of age. ?You are at high risk for heart disease. ?What should I know  about cancer screening? ?Many types of cancers can be detected early and may often be prevented. Depending on your health history and family history, you may need to have cancer screening at various ages. This may include screening for: ?Colorectal cancer. ?Prostate cancer. ?Skin cancer. ?Lung cancer. ?What should I know about heart disease, diabetes, and high blood pressure? ?Blood pressure and heart disease ?High blood pressure causes heart disease and increases the risk of stroke. This is more likely to develop in people who have high blood pressure readings or are overweight. ?Talk with your health care provider about your target blood pressure readings. ?Have your blood pressure checked: ?Every 3-5 years if you are 74-78 years of age. ?Every year if you are 8 years old or older. ?If you are between the ages of 93 and 42 and are a current or former smoker, ask your health care provider if you should have a one-time screening for abdominal aortic aneurysm (AAA). ?Diabetes ?Have regular diabetes screenings. This checks your fasting blood sugar level. Have the screening done: ?Once every three years after age 91 if you are at a normal weight and have a low risk for diabetes. ?More often and at a younger age if you are overweight or have a high risk for diabetes. ?What should I know about preventing infection? ?Hepatitis B ?If you have a higher risk for hepatitis B, you should be screened for this virus. Talk with your health care provider to find out if you are at risk for hepatitis B infection. ?Hepatitis C ?Blood testing is recommended for: ?Everyone born from [birth year] through  1965. ?Anyone with known risk factors for hepatitis C. ?Sexually transmitted infections (STIs) ?You should be screened each year for STIs, including gonorrhea and chlamydia, if: ?You are sexually active and are younger than 57 years of age. ?You are older than 57 years of age and your health care provider tells you that you are at risk for  this type of infection. ?Your sexual activity has changed since you were last screened, and you are at increased risk for chlamydia or gonorrhea. Ask your health care provider if you are at risk. ?Ask your health care provider about whether you are at high risk for HIV. Your health care provider may recommend a prescription medicine to help prevent HIV infection. If you choose to take medicine to prevent HIV, you should first get tested for HIV. You should then be tested every 3 months for as long as you are taking the medicine. ?Follow these instructions at home: ?Alcohol use ?Do not drink alcohol if your health care provider tells you not to drink. ?If you drink alcohol: ?Limit how much you have to 0-2 drinks a day. ?Know how much alcohol is in your drink. In the [place] one drink equals one 12 oz bottle of beer (355 mL), one 5 oz glass of wine (148 mL), or one 1? oz glass of hard liquor (44 mL). ?Lifestyle ?Do not use any products that contain nicotine or tobacco. These products include cigarettes, chewing tobacco, and vaping devices, such as e-cigarettes. If you need help quitting, ask your health care provider. ?Do not use street drugs. ?Do not share needles. ?Ask your health care provider for help if you need support or information about quitting drugs. ?General instructions ?Schedule regular health, dental, and eye exams. ?Stay current with your vaccines. ?Tell your health care provider if: ?You often feel depressed. ?You have ever been abused or do not feel safe at home. ?Summary ?Adopting a healthy lifestyle and getting preventive care are important in promoting health and wellness. ?Follow your health care provider's instructions about healthy diet, exercising, and getting tested or screened for diseases. ?Follow your health care provider's instructions on monitoring your cholesterol and blood pressure. ?This information is not intended to replace advice given to you by your health care provider. Make sure  you discuss any questions you have with your health care provider. ?Document Revised: 09/13/2020 Document Reviewed: 09/13/2020 ?Elsevier Patient Education ? [place]. ? ?

## 2021-09-08 LAB — BASIC METABOLIC PANEL
BUN/Creatinine Ratio: 15 (ref 9–20)
BUN: 24 mg/dL (ref 6–24)
CO2: 22 mmol/L (ref 20–29)
Calcium: 9.5 mg/dL (ref 8.7–10.2)
Chloride: 99 mmol/L (ref 96–106)
Creatinine, Ser: 1.56 mg/dL — ABNORMAL HIGH (ref 0.76–1.27)
Glucose: 95 mg/dL (ref 70–99)
Potassium: 5.1 mmol/L (ref 3.5–5.2)
Sodium: 137 mmol/L (ref 134–144)
eGFR: 51 mL/min/{1.73_m2} — ABNORMAL LOW (ref >59)

## 2021-09-10 ENCOUNTER — Emergency Department (HOSPITAL_COMMUNITY): Payer: Medicaid Other

## 2021-09-10 ENCOUNTER — Inpatient Hospital Stay (HOSPITAL_COMMUNITY)
Admission: EM | Admit: 2021-09-10 | Discharge: 2021-09-14 | DRG: 062 | Disposition: A | Discharge status: Rehabilitation Facility | Payer: Medicaid Other | Attending: Neurology | Admitting: Neurology

## 2021-09-10 ENCOUNTER — Encounter (HOSPITAL_COMMUNITY): Payer: Self-pay | Admitting: Emergency Medicine

## 2021-09-10 ENCOUNTER — Inpatient Hospital Stay (HOSPITAL_COMMUNITY): Payer: Medicaid Other

## 2021-09-10 ENCOUNTER — Other Ambulatory Visit: Payer: Self-pay

## 2021-09-10 DIAGNOSIS — R29705 NIHSS score 5: Secondary | ICD-10-CM | POA: Diagnosis present

## 2021-09-10 DIAGNOSIS — Z823 Family history of stroke: Secondary | ICD-10-CM | POA: Diagnosis not present

## 2021-09-10 DIAGNOSIS — F1410 Cocaine abuse, uncomplicated: Secondary | ICD-10-CM | POA: Diagnosis present

## 2021-09-10 DIAGNOSIS — Z8673 Personal history of transient ischemic attack (TIA), and cerebral infarction without residual deficits: Secondary | ICD-10-CM | POA: Diagnosis not present

## 2021-09-10 DIAGNOSIS — I63512 Cerebral infarction due to unspecified occlusion or stenosis of left middle cerebral artery: Principal | ICD-10-CM | POA: Diagnosis present

## 2021-09-10 DIAGNOSIS — R471 Dysarthria and anarthria: Secondary | ICD-10-CM | POA: Diagnosis present

## 2021-09-10 DIAGNOSIS — I672 Cerebral atherosclerosis: Secondary | ICD-10-CM | POA: Diagnosis present

## 2021-09-10 DIAGNOSIS — Z7982 Long term (current) use of aspirin: Secondary | ICD-10-CM | POA: Diagnosis not present

## 2021-09-10 DIAGNOSIS — E785 Hyperlipidemia, unspecified: Secondary | ICD-10-CM | POA: Diagnosis present

## 2021-09-10 DIAGNOSIS — R29703 NIHSS score 3: Secondary | ICD-10-CM | POA: Diagnosis not present

## 2021-09-10 DIAGNOSIS — F17210 Nicotine dependence, cigarettes, uncomplicated: Secondary | ICD-10-CM | POA: Diagnosis present

## 2021-09-10 DIAGNOSIS — R4781 Slurred speech: Secondary | ICD-10-CM | POA: Diagnosis present

## 2021-09-10 DIAGNOSIS — I63312 Cerebral infarction due to thrombosis of left middle cerebral artery: Secondary | ICD-10-CM | POA: Diagnosis not present

## 2021-09-10 DIAGNOSIS — R29704 NIHSS score 4: Secondary | ICD-10-CM | POA: Diagnosis not present

## 2021-09-10 DIAGNOSIS — I6389 Other cerebral infarction: Secondary | ICD-10-CM

## 2021-09-10 DIAGNOSIS — I639 Cerebral infarction, unspecified: Secondary | ICD-10-CM | POA: Diagnosis not present

## 2021-09-10 DIAGNOSIS — N179 Acute kidney failure, unspecified: Secondary | ICD-10-CM | POA: Diagnosis not present

## 2021-09-10 DIAGNOSIS — I700 Atherosclerosis of aorta: Secondary | ICD-10-CM | POA: Diagnosis present

## 2021-09-10 DIAGNOSIS — Z8711 Personal history of peptic ulcer disease: Secondary | ICD-10-CM

## 2021-09-10 DIAGNOSIS — D72829 Elevated white blood cell count, unspecified: Secondary | ICD-10-CM | POA: Diagnosis not present

## 2021-09-10 DIAGNOSIS — I10 Essential (primary) hypertension: Secondary | ICD-10-CM | POA: Diagnosis present

## 2021-09-10 DIAGNOSIS — R29706 NIHSS score 6: Secondary | ICD-10-CM | POA: Diagnosis not present

## 2021-09-10 DIAGNOSIS — R1312 Dysphagia, oropharyngeal phase: Secondary | ICD-10-CM | POA: Diagnosis not present

## 2021-09-10 DIAGNOSIS — F1910 Other psychoactive substance abuse, uncomplicated: Secondary | ICD-10-CM | POA: Diagnosis not present

## 2021-09-10 DIAGNOSIS — L8990 Pressure ulcer of unspecified site, unspecified stage: Secondary | ICD-10-CM | POA: Insufficient documentation

## 2021-09-10 DIAGNOSIS — G8191 Hemiplegia, unspecified affecting right dominant side: Secondary | ICD-10-CM | POA: Diagnosis present

## 2021-09-10 DIAGNOSIS — Z79899 Other long term (current) drug therapy: Secondary | ICD-10-CM | POA: Diagnosis not present

## 2021-09-10 LAB — COMPREHENSIVE METABOLIC PANEL
ALT: 12 U/L (ref 0–44)
AST: 16 U/L (ref 15–41)
Albumin: 4 g/dL (ref 3.5–5.0)
Alkaline Phosphatase: 60 U/L (ref 38–126)
Anion gap: 8 (ref 5–15)
BUN: 25 mg/dL — ABNORMAL HIGH (ref 6–20)
CO2: 24 mmol/L (ref 22–32)
Calcium: 9.3 mg/dL (ref 8.9–10.3)
Chloride: 104 mmol/L (ref 98–111)
Creatinine, Ser: 1.85 mg/dL — ABNORMAL HIGH (ref 0.61–1.24)
GFR, Estimated: 42 mL/min — ABNORMAL LOW (ref >60)
Glucose, Bld: 115 mg/dL — ABNORMAL HIGH (ref 70–99)
Potassium: 4.4 mmol/L (ref 3.5–5.1)
Sodium: 136 mmol/L (ref 135–145)
Total Bilirubin: 0.7 mg/dL (ref 0.3–1.2)
Total Protein: 7.2 g/dL (ref 6.5–8.1)

## 2021-09-10 LAB — URINALYSIS, COMPLETE (UACMP) WITH MICROSCOPIC
Bacteria, UA: NONE SEEN
Bilirubin Urine: NEGATIVE
Glucose, UA: NEGATIVE mg/dL
Hgb urine dipstick: NEGATIVE
Ketones, ur: NEGATIVE mg/dL
Leukocytes,Ua: NEGATIVE
Nitrite: NEGATIVE
Protein, ur: NEGATIVE mg/dL
Specific Gravity, Urine: 1.014 (ref 1.005–1.030)
pH: 5 (ref 5.0–8.0)

## 2021-09-10 LAB — ECHOCARDIOGRAM COMPLETE
AR max vel: 3.21 cm2
AV Area VTI: 3.32 cm2
AV Area mean vel: 3.28 cm2
AV Mean grad: 3 mmHg
AV Peak grad: 6 mmHg
Ao pk vel: 1.22 m/s
Area-P 1/2: 3.08 cm2
S' Lateral: 2.6 cm

## 2021-09-10 LAB — DIFFERENTIAL
Abs Immature Granulocytes: 0.06 10*3/uL (ref 0.00–0.07)
Basophils Absolute: 0.1 10*3/uL (ref 0.0–0.1)
Basophils Relative: 1 %
Eosinophils Absolute: 0.2 10*3/uL (ref 0.0–0.5)
Eosinophils Relative: 1 %
Immature Granulocytes: 1 %
Lymphocytes Relative: 16 %
Lymphs Abs: 1.8 10*3/uL (ref 0.7–4.0)
Monocytes Absolute: 0.9 10*3/uL (ref 0.1–1.0)
Monocytes Relative: 8 %
Neutro Abs: 8.8 10*3/uL — ABNORMAL HIGH (ref 1.7–7.7)
Neutrophils Relative %: 73 %

## 2021-09-10 LAB — CBC
HCT: 40.1 % (ref 39.0–52.0)
Hemoglobin: 13.9 g/dL (ref 13.0–17.0)
MCH: 31.2 pg (ref 26.0–34.0)
MCHC: 34.7 g/dL (ref 30.0–36.0)
MCV: 90.1 fL (ref 80.0–100.0)
Platelets: 321 10*3/uL (ref 150–400)
RBC: 4.45 MIL/uL (ref 4.22–5.81)
RDW: 11.9 % (ref 11.5–15.5)
WBC: 11.8 10*3/uL — ABNORMAL HIGH (ref 4.0–10.5)
nRBC: 0 % (ref 0.0–0.2)

## 2021-09-10 LAB — CBG MONITORING, ED: Glucose-Capillary: 116 mg/dL — ABNORMAL HIGH (ref 70–99)

## 2021-09-10 LAB — ETHANOL: Alcohol, Ethyl (B): <10 mg/dL (ref <10)

## 2021-09-10 LAB — APTT: aPTT: 29 seconds (ref 24–36)

## 2021-09-10 LAB — PROTIME-INR
INR: 0.9 (ref 0.8–1.2)
Prothrombin Time: 12.1 seconds (ref 11.4–15.2)

## 2021-09-10 LAB — HEMOGLOBIN A1C
Hgb A1c MFr Bld: 6.2 % — ABNORMAL HIGH (ref 4.8–5.6)
Mean Plasma Glucose: 131.24 mg/dL

## 2021-09-10 MED ORDER — ACETAMINOPHEN 650 MG RE SUPP: 650.0000 mg | RECTAL | Status: DC | PRN

## 2021-09-10 MED ORDER — TENECTEPLASE FOR STROKE
0.2500 mg/kg | PACK | Freq: Once | INTRAVENOUS | Status: AC
  Administered 2021-09-10: 15 mg via INTRAVENOUS
  Filled 2021-09-10: qty 10

## 2021-09-10 MED ORDER — PANTOPRAZOLE SODIUM 40 MG IV SOLR
40.0000 mg | Freq: Every day | INTRAVENOUS | Status: DC
  Administered 2021-09-10 – 2021-09-11 (×2): 40 mg via INTRAVENOUS
  Filled 2021-09-10 (×2): qty 10

## 2021-09-10 MED ORDER — IOHEXOL 350 MG/ML SOLN
100.0000 mL | Freq: Once | INTRAVENOUS | Status: AC | PRN
  Administered 2021-09-10: 100 mL via INTRAVENOUS

## 2021-09-10 MED ORDER — STROKE: EARLY STAGES OF RECOVERY BOOK: Freq: Once | Status: DC

## 2021-09-10 MED ORDER — ACETAMINOPHEN 325 MG PO TABS
650.0000 mg | ORAL_TABLET | ORAL | Status: DC | PRN
Start: 2021-09-10 — End: 2021-09-14

## 2021-09-10 MED ORDER — SODIUM CHLORIDE 0.9% FLUSH: 3.0000 mL | Freq: Once | INTRAVENOUS | Status: DC

## 2021-09-10 MED ORDER — ACETAMINOPHEN 160 MG/5ML PO SOLN: 650.0000 mg | ORAL | Status: DC | PRN

## 2021-09-10 MED ORDER — SENNOSIDES-DOCUSATE SODIUM 8.6-50 MG PO TABS: 1.0000 | ORAL_TABLET | Freq: Every evening | ORAL | Status: DC | PRN

## 2021-09-10 MED ORDER — SODIUM CHLORIDE 0.9 % IV SOLN: INTRAVENOUS | Status: DC

## 2021-09-10 MED ORDER — LABETALOL HCL 5 MG/ML IV SOLN: 10.0000 mg | INTRAVENOUS | Status: DC | PRN

## 2021-09-10 NOTE — Progress Notes (Signed)
This RN ready for report. ED notified via secure chat. ?

## 2021-09-10 NOTE — ED Notes (Signed)
Notified Dr. Amada Jupiter that patient's speech is markedly more diarthric at this time. Pt had improvement after TNK administration but at 11:45 NIH exam speech had worsened to baseline when patient presented to ED. [facility] placed orders for head CT and patient immediately brought to CT scanner.  ?

## 2021-09-10 NOTE — Progress Notes (Signed)
Pt arrived

## 2021-09-10 NOTE — ED Notes (Signed)
Code Stroke called at 10:47 ?

## 2021-09-10 NOTE — H&P (Addendum)
Neurology H&P ? ?CC: right side weakness and slurred speech  ? ?History is obtained from:patient and medical chart  ? ?HPI: Christopher [name] is a 56 y.o. male with past medical history of HTN, HLD, CVA, PUD, hx of cocaine abuse and current tobacco smoker who presents to the [facility] ED for acute onset of right side weakness and slurred speech. States he awoke this am normal and LKW 0945. CT head negative for acute stroke. CTA negative for LVO. VSS, CBG 116. Decision made to give TNK which was given @ 1108. NIHSS 5. Patient endorses last cocaine use was 5 days ago. Patient will be admitted to the Neuro ICU for further management.  ? ? ?LKW: 0945 on 5/6 ?tpa given?: yes @1108   ?Modified Rankin Scale: 2-Slight disability-UNABLE to perform all activities but does not need assistance ? ?ROS: A 14 point ROS was performed and is negative except as noted in the HPI.   ? ?Past Medical History:  ?Diagnosis Date  ? PUD (peptic ulcer disease) 2008  ? treated with meds  ? ? ? ?Family History  ?Problem Relation Age of Onset  ? Cancer Mother   ? Stroke Father   ? ? ? ?Social History:  reports that he has been smoking cigarettes. He has been smoking an average of .5 packs per day. He has quit using smokeless tobacco. He reports current alcohol use. He reports current drug use. Drug: "Crack" cocaine. ? ? ?Exam: ?Current vital signs: ?BP 125/87   Pulse (!) 102   Temp 98.7 ?F (37.1 ?C) (Oral)   Resp 18   SpO2 99%  ?Vital signs in last 24 hours: ?Temp:  [98.7 ?F (37.1 ?C)] 98.7 ?F (37.1 ?C) (05/06 1047) ?Pulse Rate:  [102] 102 (05/06 1047) ?Resp:  [18] 18 (05/06 1047) ?BP: (125)/(87) 125/87 (05/06 1047) ?SpO2:  [99 %] 99 % (05/06 1047) ? ?Physical Exam  ?Constitutional: Appears well-developed and well-nourished.  ?Psych: Affect appropriate to situation ?Eyes: No scleral injection ?HENT: No OP obstrucion ?Head: Normocephalic.  ?Cardiovascular: Normal rate and regular rhythm.  ?Respiratory: Effort normal and breath sounds normal to  anterior ascultation ?GI: Soft.  No distension. There is no tenderness.  ?Skin: WDI ? ?Neuro: ?Mental Status: ?Patient is awake, alert, oriented to person, place, month, year, and situation. Speech is very dysarthric.  ?Patient is able to give a clear and coherent history. ?No signs of aphasia or neglect ?Cranial Nerves: ?II: Visual Fields are full. Pupils are equal, round, and reactive to light.   ?III,IV, VI: EOMI without ptosis or diploplia.  ?V: Facial sensation is symmetric to temperature ?VII: right facial weakness ?VIII: hearing is intact to voice ?X: Uvula elevates symmetrically ?XI: Shoulder shrug is symmetric. ?XII: tongue is midline without atrophy or fasciculations.  ?Motor: ?Tone is normal. Bulk is normal. Right arm 4/5, Right leg  5/5. 5/5 strength in left upper and lower extremity ?Sensory: ? Decreased on right side  ?Cerebellar: ?FNF and HKS are intact bilaterally ? ? ?I have reviewed the images obtained: ? ?Code stroke CT Head 5/6: ?1. A right occipital infarct is new since September but appears chronic. No acute cortically based infarct or acute intracranial hemorrhage identified. ASPECTS 10. ?2. Underlying advanced small vessel disease appears stable since September. ? ?Code stroke CTA head/neck 5/6: ?1. Negative for large vessel occlusion, but Positive for new short ?segment occlusion of the Right Vertebral Artery V1 segment which has ?progressed since September. Reconstituted and diminutive but patent ?Right Vertebral. ? 2. 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 ? 3. Aortic Atherosclerosis (ICD10-I70.0) and Emphysema (ICD10-J43.9). ?Primary Diagnosis:  ? ?NIHSS: ?1a Level of Conscious.: 0 ?1b LOC Questions: 0 ?1c LOC Commands: 0 ?2 Best Gaze: 0 ?3 Visual: 0 ?4 Facial Palsy: 1 ?5a Motor Arm - left: 0 ?5b Motor Arm - Right: 1 ?6a Motor Leg - Left: 0 ?6b Motor Leg - Right: 0 ?7 Limb Ataxia: 0 ?8 Sensory:  1 ?9 Best Language: 0 ?10 Dysarthria: 2 ?11 Extinct. and Inatten.: 0 ?TOTAL: 5 ? ? ?Secondary Diagnosis: ?Essential (primary) hypertension ?HLD ?Cocaine and tobacco abuse  ?PUD ? ?Impression:  ?Acute left hemisphere ischemic infarct.  ? ?Recommendations: ?- admit to Neuro ICU  ?- UA and UDS, ETOH level ? - HgbA1c, fasting lipid panel ?- MRI of the brain without contrast ?- Frequent neuro checks ?- bedside swallow screen  ?- BP goal <180/105  ?- Labetalol 10mg  IV PRN to maintain BP goal. ?- EKG  ?- 24hr post TNK CTH. Scheduled for 5/7 @1100  ?- Echocardiogram ?- No Antiplatelet for 24 hrs post TNK. Will start ASA/ Plavix after 24 hr CT H if no hemorrhage   ?- Risk factor modification ?- Telemetry monitoring ?- PT consult, OT consult, Speech consult ?- Stroke team to follow  ? ?Beulah Gandy DNP, ACNPC-AG  ? ?I have seen the patient and was present and jointly formulated the entirety of the evaluation and management reflected in the above note. ? ?He has what I suspect is a small subcortical infarct, with some waxing/waning he is within the timeframe for tenecteplase.  Of note he does have a chronic problem with cocaine, most recent use 5 days ago. ? ?He will be admitted to the ICU for post tenecteplase care. ? ?This patient is critically ill and at significant risk of neurological worsening, death and care requires constant monitoring of vital signs, hemodynamics,respiratory and cardiac monitoring, neurological assessment, discussion with family, other specialists and medical decision making of high complexity. I spent 45 minutes of neurocritical care time  in the care of  this patient. This was time spent independent of any time provided by nurse practitioner or PA. ? ?Roland Rack, MD ?[facility] ?[phone] ? ?If 7pm- 7am, please page neurology on call as listed in [place]. ?09/10/2021  2:09 PM ? ?

## 2021-09-10 NOTE — Progress Notes (Signed)
PHARMACIST CODE STROKE RESPONSE ? ?Notified to mix TNK at 1100 by Dr. Leonel Ramsay ?Delivered TNK to RN at 1103 -- Given at 1108 (see below) ? ?TNK dose = 15 mg IV over 5 seconds ? ?Issues/delays encountered (if applicable):  ?[id number] - TNK ordered ?1103 - TNK ready - but speech improved dramatically decsion to get CTA before TNK administration by neurologist ?1106 - CTA complete ?1108 - Re-assessed following CTA - symptomatic again on sitting TNK given ? ?Lorelei Pont, PharmD, BCPS ?09/10/2021 11:14 AM ?ED Clinical Pharmacist -  [phone] ? ? ?

## 2021-09-10 NOTE — Progress Notes (Signed)
PT Cancellation Note ? ?Patient Details ?Name: Christopher [name] ?MRN: [id number] ?DOB: 12-14-1965 ? ? ?Cancelled Treatment:    Reason Eval/Treat Not Completed: Active bedrest order. TNK 5/6 at 1108. ? ? ?[name] ?09/10/2021, 1:07 PM ? ?[name], PT  ?Office # [phone] ?Pager [phone] ? ?

## 2021-09-10 NOTE — Progress Notes (Signed)
?  Echocardiogram ?2D Echocardiogram has been performed. ? ?Christopher [name] ?09/10/2021, 2:51 PM ?

## 2021-09-10 NOTE — Code Documentation (Signed)
Stroke Response Nurse Documentation ?Code Documentation ? ?Christopher [name] is a 56 y.o. male arriving to [facility]  via [employer] on 09-10-2021 with past medical hx of cva, htn, cocaine abuse, smoker . On No antithrombotic. Code stroke was activated by ED.  ? ?Patient from home where he was LKW at [place] and now complaining of not speaking well and difficulty using his right hand .  ? ?Stroke team at the bedside on patient arrival. Labs drawn and patient cleared for CT by EDP. Patient to CT with team. NIHSS 5, see documentation for details and code stroke times. Patient with right facial droop, right arm weakness, right decreased sensation, and dysarthria  on exam. The following imaging was completed:  CT Head and CTA. Patient is a candidate for IV Thrombolytic.  TNK given 1108 Patient is not a candidate for IR due to no LVO on CTA.  ? ?Care Plan: mNIHSS and VS  q 68min x 2 hours, then q 64min x 6 hours then q 1hr x 16 hour.  ? ?Bedside handoff with ED RN Emilee.   ? ?Raliegh Ip  ?Stroke Response R ?  ?

## 2021-09-10 NOTE — ED Triage Notes (Signed)
Pt reports garbled speech and R arm weakness since 9:45am.  States he was normal when he woke up this morning.  Code Stroke activated and Dr. Matilde Sprang to triage to assess pt as labs were obtained.  Pt to CT. ?

## 2021-09-10 NOTE — ED Provider Notes (Signed)
?[facility]TH NEURO/TRAUMA/SURGICAL ICU ?Provider Note ? ?CSN: [id number] ?Arrival date & time: 09/10/21 1039 ? ?Chief Complaint(s) ?Code Stroke ? ?HPI ?Christopher [name] is a 56 y.o. male who presents emergency department for evaluation of right-sided weakness and slurred speech.  Last known normal 945.  Patient with history of HTN, HLD, peptic ulcer disease and cocaine abuse.  Additional history unable to be obtained due to patient's slurred speech. ? ? ?Past Medical History ?Past Medical History:  ?Diagnosis Date  ? PUD (peptic ulcer disease) 2008  ? treated with meds  ? ?Patient Active Problem List  ? Diagnosis Date Noted  ? Stroke ([place]) 09/10/2021  ? Gastroesophageal reflux disease without esophagitis 08/24/2021  ? History of CVA (cerebrovascular accident) 08/11/2021  ? Dyslipidemia 01/24/2021  ? Essential hypertension   ? Hyponatremia   ? Polysubstance abuse ([place])   ? Cerebral thrombosis with cerebral infarction 01/11/2021  ? Acute CVA (cerebrovascular accident) ([place]) 01/11/2021  ? Right sided weakness 01/10/2021  ? AKI (acute kidney injury) ([place]) 01/10/2021  ? Tobacco abuse 01/10/2021  ? Cocaine abuse ([place]) 01/10/2021  ? Leukocytosis 01/10/2021  ? Cellulitis and abscess of upper arm and forearm 11/13/2012  ? Episodic tobacco abuse 11/13/2012  ? ?Home Medication(s) ?Prior to Admission medications   ?Medication Sig Start Date End Date Taking? Authorizing Provider  ?acetaminophen (TYLENOL) 325 MG tablet Take 1-2 tablets (325-650 mg total) by mouth every 4 (four) hours as needed for mild pain. 01/21/21   Bary Leriche, PA-C  ?aspirin 81 MG chewable tablet Chew 1 tablet (81 mg total) by mouth daily. 06/09/21   Dorna Mai, MD  ?atorvastatin (LIPITOR) 20 MG tablet Take 1 tablet (20 mg total) by mouth daily. 06/09/21   Dorna Mai, MD  ?lisinopril (ZESTRIL) 20 MG tablet Take 1 tablet (20 mg total) by mouth daily. 09/07/21   Mayers, Cari S, PA-C  ?omeprazole (PRILOSEC) 20 MG capsule Take 1 capsule (20 mg total) by mouth  daily. 08/23/21   Mayers, Loraine Grip, PA-C  ?                                                                                                                                  ?Past Surgical History ?History reviewed. No pertinent surgical history. ?Family History ?Family History  ?Problem Relation Age of Onset  ? Cancer Mother   ? Stroke Father   ? ? ?Social History ?Social History  ? ?Tobacco Use  ? Smoking status: Every Day  ?  Packs/day: 0.50  ?  Types: Cigarettes  ? Smokeless tobacco: Former  ?Vaping Use  ? Vaping Use: Never used  ?Substance Use Topics  ? Alcohol use: Yes  ?  Comment: 2-3 "sparks" a day  ? Drug use: Yes  ?  Types: "Crack" cocaine  ?  Comment: "only every once in awhile"  ? ?Allergies ?Patient has no known allergies. ? ?Review of Systems ?Review of Systems  ?Unable to  perform ROS: Patient nonverbal  ? ?Physical Exam ?Vital Signs  ?I have reviewed the triage vital signs ?BP 119/74   Pulse 71   Temp 98.5 ?F (36.9 ?C) (Oral)   Resp 19   SpO2 94%  ? ?Physical Exam ?Constitutional:   ?   General: He is not in acute distress. ?   Appearance: Normal appearance.  ?HENT:  ?   Head: Normocephalic and atraumatic.  ?   Nose: No congestion or rhinorrhea.  ?Eyes:  ?   General:     ?   Right eye: No discharge.     ?   Left eye: No discharge.  ?   Extraocular Movements: Extraocular movements intact.  ?   Pupils: Pupils are equal, round, and reactive to light.  ?Cardiovascular:  ?   Rate and Rhythm: Normal rate and regular rhythm.  ?   Heart sounds: No murmur heard. ?Pulmonary:  ?   Effort: No respiratory distress.  ?   Breath sounds: No wheezing or rales.  ?Abdominal:  ?   General: There is no distension.  ?   Tenderness: There is no abdominal tenderness.  ?Musculoskeletal:     ?   General: Normal range of motion.  ?   Cervical back: Normal range of motion.  ?Skin: ?   General: Skin is warm and dry.  ?Neurological:  ?   Mental Status: He is alert.  ?   Motor: Weakness present.  ? ? ?ED Results and  Treatments ?Labs ?(all labs ordered are listed, but only abnormal results are displayed) ?Labs Reviewed  ?CBC - Abnormal; Notable for the following components:  ?    Result Value  ? WBC 11.8 (*)   ? All other components within normal limits  ?DIFFERENTIAL - Abnormal; Notable for the following components:  ? Neutro Abs 8.8 (*)   ? All other components within normal limits  ?COMPREHENSIVE METABOLIC PANEL - Abnormal; Notable for the following components:  ? Glucose, Bld 115 (*)   ? BUN 25 (*)   ? Creatinine, Ser 1.85 (*)   ? GFR, Estimated 42 (*)   ? All other components within normal limits  ?HEMOGLOBIN A1C - Abnormal; Notable for the following components:  ? Hgb A1c MFr Bld 6.2 (*)   ? All other components within normal limits  ?CBG MONITORING, ED - Abnormal; Notable for the following components:  ? Glucose-Capillary 116 (*)   ? All other components within normal limits  ?PROTIME-INR  ?APTT  ?URINALYSIS, COMPLETE (UACMP) WITH MICROSCOPIC  ?ETHANOL  ?RAPID URINE DRUG SCREEN, HOSP PERFORMED  ?I-STAT CHEM 8, ED  ?                                                                                                                       ? ?Radiology ?CT Head Wo Contrast ? ?Result Date: 09/10/2021 ?CLINICAL DATA:  Altered mental status EXAM: CT HEAD WITHOUT CONTRAST TECHNIQUE: Contiguous axial images were obtained from the base of the skull  through the vertex without intravenous contrast. RADIATION DOSE REDUCTION: This exam was performed according to the departmental dose-optimization program which includes automated exposure control, adjustment of the mA and/or kV according to patient size and/or use of iterative reconstruction technique. COMPARISON:  09/10/2021 at 1101 hours FINDINGS: Brain: No evidence of acute infarction, hemorrhage, hydrocephalus, extra-axial collection or mass lesion/mass effect. Stable chronic right occipital lobe infarct. Chronic bilateral basal ganglia lacunar infarcts. Scattered low-density changes  within the periventricular and subcortical white matter compatible with chronic microvascular ischemic change. Mild diffuse cerebral volume loss. Vascular: Intravascular contrast is present. Skull: Normal. Negative for fracture or focal lesion. Sinuses/Orbits: No acute finding. Other: None. IMPRESSION: No acute intracranial abnormality.  Stable exam. Electronically Signed   By: Davina Poke D.O.   On: 09/10/2021 12:23  ? ?ECHOCARDIOGRAM COMPLETE ? ?Result Date: 09/10/2021 ?   ECHOCARDIOGRAM REPORT   Patient Name:   Christopher [name] Date of Exam: 09/10/2021 Medical Rec #:  [id number]        Height:       69.0 in Accession #:    [id number]       Weight:       132.0 lb Date of Birth:  30-Dec-1965        BSA:          1.732 m? Patient Age:    13 years         BP:           99/66 mmHg Patient Gender: M                HR:           74 bpm. Exam Location:  Inpatient Procedure: 2D Echo, Cardiac Doppler and Color Doppler Indications:    Stroke  History:        Patient has prior history of Echocardiogram examinations, most                 recent 01/10/2021. Stroke.  Sonographer:    Merrie Roof RDCS Referring Phys: [place]  1. Left ventricular ejection fraction, by estimation, is 60 to 65%. The left ventricle has normal function. The left ventricle has no regional wall motion abnormalities. Left ventricular diastolic parameters were normal.  2. Right ventricular systolic function is normal. The right ventricular size is normal. Tricuspid regurgitation signal is inadequate for assessing PA pressure.  3. The mitral valve is normal in structure. Trivial mitral valve regurgitation. No evidence of mitral stenosis.  4. The aortic valve is tricuspid. Aortic valve regurgitation is mild. Aortic valve sclerosis is present, with no evidence of aortic valve stenosis.  5. The inferior vena cava is normal in size with greater than 50% respiratory variability, suggesting right atrial pressure of 3 mmHg. FINDINGS  Left  Ventricle: Left ventricular ejection fraction, by estimation, is 60 to 65%. The left ventricle has normal function. The left ventricle has no regional wall motion abnormalities. The left ventricular internal cavity size was norm

## 2021-09-11 ENCOUNTER — Inpatient Hospital Stay (HOSPITAL_COMMUNITY): Payer: Medicaid Other

## 2021-09-11 DIAGNOSIS — I63312 Cerebral infarction due to thrombosis of left middle cerebral artery: Secondary | ICD-10-CM

## 2021-09-11 DIAGNOSIS — L8990 Pressure ulcer of unspecified site, unspecified stage: Secondary | ICD-10-CM | POA: Insufficient documentation

## 2021-09-11 LAB — LIPID PANEL
Cholesterol: 113 mg/dL (ref 0–200)
HDL: 26 mg/dL — ABNORMAL LOW (ref >40)
LDL Cholesterol: 68 mg/dL (ref 0–99)
Total CHOL/HDL Ratio: 4.3 RATIO
Triglycerides: 94 mg/dL (ref <150)
VLDL: 19 mg/dL (ref 0–40)

## 2021-09-11 MED ORDER — LORAZEPAM 2 MG/ML IJ SOLN
2.0000 mg | Freq: Once | INTRAMUSCULAR | Status: AC | PRN
Start: 2021-09-11 — End: 2021-09-11

## 2021-09-11 MED ORDER — ASPIRIN 325 MG PO TABS
325.0000 mg | ORAL_TABLET | Freq: Every day | ORAL | Status: DC
  Administered 2021-09-11 – 2021-09-14 (×4): 325 mg via ORAL
  Filled 2021-09-11 (×4): qty 1

## 2021-09-11 MED ORDER — FOOD THICKENER (SIMPLYTHICK)
10.0000 | ORAL | Status: DC | PRN
  Filled 2021-09-11: qty 10

## 2021-09-11 MED ORDER — LORAZEPAM 2 MG/ML IJ SOLN
INTRAMUSCULAR | Status: AC
  Administered 2021-09-11: 2 mg via INTRAVENOUS
  Filled 2021-09-11: qty 1

## 2021-09-11 MED ORDER — CLOPIDOGREL BISULFATE 75 MG PO TABS
75.0000 mg | ORAL_TABLET | Freq: Every day | ORAL | Status: DC
Start: 2021-09-11 — End: 2021-09-11

## 2021-09-11 MED ORDER — ASPIRIN 81 MG PO CHEW
81.0000 mg | CHEWABLE_TABLET | Freq: Every day | ORAL | Status: DC
Start: 2021-09-11 — End: 2021-09-11

## 2021-09-11 MED ORDER — CHLORHEXIDINE GLUCONATE CLOTH 2 % EX PADS
6.0000 | MEDICATED_PAD | Freq: Every day | CUTANEOUS | Status: DC
  Administered 2021-09-13 – 2021-09-14 (×2): 6 via TOPICAL

## 2021-09-11 NOTE — Evaluation (Signed)
Speech Language Pathology Evaluation ?Patient Details ?Name: Christopher [name] ?MRN: [id number] ?DOB: August 05, 1965 ?Today's Date: 09/11/2021 ?Time: 2952-8413 ?SLP Time Calculation (min) (ACUTE ONLY): 13 min ? ?Problem List:  ?Patient Active Problem List  ? Diagnosis Date Noted  ? Stroke ([facility]) 09/10/2021  ? Gastroesophageal reflux disease without esophagitis 08/24/2021  ? History of CVA (cerebrovascular accident) 08/11/2021  ? Dyslipidemia 01/24/2021  ? Essential hypertension   ? Hyponatremia   ? Polysubstance abuse ([facility])   ? Cerebral thrombosis with cerebral infarction 01/11/2021  ? Acute CVA (cerebrovascular accident) ([facility]) 01/11/2021  ? Right sided weakness 01/10/2021  ? AKI (acute kidney injury) ([facility]) 01/10/2021  ? Tobacco abuse 01/10/2021  ? Cocaine abuse ([facility]) 01/10/2021  ? Leukocytosis 01/10/2021  ? Cellulitis and abscess of upper arm and forearm 11/13/2012  ? Episodic tobacco abuse 11/13/2012  ? ?Past Medical History:  ?Past Medical History:  ?Diagnosis Date  ? PUD (peptic ulcer disease) 2008  ? treated with meds  ? ?Past Surgical History: History reviewed. No pertinent surgical history. ?HPI:  ?Christopher [name] is a 56 y.o. male who presented to the [facility] ED for acute onset of right side weakness and dysarthria. TNK given. PMHx HTN, HLD, CVA, PUD, hx of cocaine abuse (as recently as five days PTA) and current tobacco smoker.  CT head showed right occipital infarct, new since Sept but appears chronic; chronic bilateral basal ganglia infarcts.  Neuro MD suspects small left subcortical infarct. Pt underwent speech/swallow evals after admission in Sept for CVA - WFL with no f/u recommended. Has supportive sister/friends.  ? ?Assessment / Plan / Recommendation ?Clinical Impression ? Christopher [name] presents with a severe dysarthria of speech c/b hypernasal resonance, reduced articulatory contacts leading to poor formation of consonants.  He can imitate and modify vowel sounds with cues and repetition.  He counted and recited  the DOW with approx 25% intelligibility. Comprehension is intact as evidenced by yes/no responses (100% accuracy for basic questions), ability to follow multi-step commands with 80% accuracy, and written responses that reflect understanding of symbolic language.  Pt was able to write in order to answer questions, demonstrate intact naming, and do simple math calculations. He was provided with paper/pencil; he is left=handed, fortunately.  Pt will benefit from SLP tx to address dysarthria.  OT/PT evals are pending - from an SLP perspective he is a good candidate for AIR. ?   ?SLP Assessment ? SLP Recommendation/Assessment: Patient needs continued Speech Lanaguage Pathology Services ?SLP Visit Diagnosis: Dysarthria and anarthria (R47.1)  ?  ?Recommendations for follow up therapy are one component of a multi-disciplinary discharge planning process, led by the attending physician.  Recommendations may be updated based on patient status, additional functional criteria and insurance authorization. ?   ?Follow Up Recommendations ? Acute inpatient rehab (3hours/day)  ?  ?Assistance Recommended at Discharge ? Frequent or constant Supervision/Assistance  ?Functional Status Assessment Patient has had a recent decline in their functional status and demonstrates the ability to make significant improvements in function in a reasonable and predictable amount of time.  ?Frequency and Duration min 2x/week  ?2 weeks ?  ?   ?SLP Evaluation ?Cognition ? Overall Cognitive Status: Within Functional Limits for tasks assessed ?Arousal/Alertness: Awake/alert ?Orientation Level: Oriented X4 ?Attention: Sustained ?Sustained Attention: Appears intact ?Memory: Appears intact  ?  ?   ?Comprehension ? Auditory Comprehension ?Overall Auditory Comprehension: Appears within functional limits for tasks assessed ?Yes/No Questions: Within Functional Limits ?Commands: Impaired ?Two Step Basic Commands: 75-100% accurate ?Multistep Basic Commands:  50-74%  accurate ?Reading Comprehension ?Reading Status: Not tested  ?  ?Expression Expression ?Primary Mode of Expression: Verbal ?Verbal Expression ?Overall Verbal Expression: Appears within functional limits for tasks assessed ?Initiation: No impairment ?Level of Generative/Spontaneous Verbalization: Phrase ?Repetition: Impaired (due to dysarthria) ?Naming: No impairment ?Non-Verbal Means of Communication: Writing;Gestures ?Written Expression ?Dominant Hand: Left ?Written Expression: Within Functional Limits   ?Oral / Motor ? Oral Motor/Sensory Function ?Overall Oral Motor/Sensory Function: Mild impairment ?Facial ROM: Reduced right;Suspected CN VII (facial) dysfunction ?Facial Symmetry: Abnormal symmetry right;Suspected CN VII (facial) dysfunction ?Lingual ROM: Reduced right;Suspected CN XII (hypoglossal) dysfunction ?Lingual Symmetry: Within Functional Limits ?Lingual Strength: Reduced;Suspected CN XII (hypoglossal) dysfunction ?Velum: Suspected CN X (Vagus) dysfunction ?Mandible: Within Functional Limits ?Motor Speech ?Overall Motor Speech: Impaired ?Respiration: Within functional limits ?Phonation: Normal ?Resonance: Hypernasality ?Articulation: Impaired ?Level of Impairment: Word ?Intelligibility: Intelligibility reduced ?Word: 25-49% accurate ?Phrase: 25-49% accurate ?Sentence: 25-49% accurate ?Motor Planning: Witnin functional limits   ?        ? ?Christopher [name] ?09/11/2021, 9:18 AM ?Marchelle Folks L. Theoplis Garciagarcia, MA CCC/SLP ?Acute Rehabilitation Services ?Office number [phone] ?Pager [phone] ? ?

## 2021-09-11 NOTE — Evaluation (Signed)
Clinical/Bedside Swallow Evaluation ?Patient Details  ?Name: Christopher [name] ?MRN: [id number] ?Date of Birth: [birth year] ? ?Today's Date: 09/11/2021 ?Time: SLP Start Time (ACUTE ONLY): [id number] SLP Stop Time (ACUTE ONLY): 4008 ?SLP Time Calculation (min) (ACUTE ONLY): 10 min ? ?Past Medical History:  ?Past Medical History:  ?Diagnosis Date  ? PUD (peptic ulcer disease) 2008  ? treated with meds  ? ?Past Surgical History: History reviewed. No pertinent surgical history. ?HPI:  ?Christopher [name] is a 56 y.o. male who presented to the [facility] ED for acute onset of right side weakness and dysarthria. TNK given. PMHx HTN, HLD, CVA, PUD, hx of cocaine abuse (as recently as five days PTA) and current tobacco smoker.  CT head showed right occipital infarct, new since Sept but appears chronic; chronic bilateral basal ganglia infarcts.  Neuro MD suspects small left subcortical infarct. Pt underwent speech/swallow evals after admission in Sept for CVA - WFL with no f/u recommended. Has supportive sister/friends.  ?  ?Assessment / Plan / Recommendation  ?Clinical Impression ? Pt presents with a neurogenic dysphagia with decreased oral control/spillage on right with purees/thin liquids and immediate coughing after drinking water.  Nectar-thick liquids appeared to be tolerated well.  There is CN involvement of central CN VII on right, CN X and XII.  Recommend initiating a dysphagia 1 diet with nectar thick liquids for now. Give meds whole in puree. SLP will follow for safety/diet progression and determine in next 24-48 hours if MBS is needed. D/W RN and pt - he agrees with plan. ?SLP Visit Diagnosis: Dysphagia, oropharyngeal phase (R13.12) ?   ?Aspiration Risk ?   tba ?  ?Diet Recommendation   Dysphagia 1/nectar thick liquids ? ?Medication Administration: Whole meds with puree  ?  ?Other  Recommendations Oral Care Recommendations: Oral care BID   ? ?Recommendations for follow up therapy are one component of a multi-disciplinary discharge  planning process, led by the attending physician.  Recommendations may be updated based on patient status, additional functional criteria and insurance authorization. ? ?Follow up Recommendations Acute inpatient rehab (3hours/day)  ? ? ?  ?Assistance Recommended at Discharge    ?Functional Status Assessment Patient has had a recent decline in their functional status and demonstrates the ability to make significant improvements in function in a reasonable and predictable amount of time.  ?Frequency and Duration min 2x/week  ?2 weeks ?  ?   ? ?Prognosis Prognosis for Safe Diet Advancement: Good  ? ?  ? ?Swallow Study   ?General HPI: Christopher [name] is a 56 y.o. male who presented to the [facility] ED for acute onset of right side weakness and dysarthria. TNK given. PMHx HTN, HLD, CVA, PUD, hx of cocaine abuse (as recently as five days PTA) and current tobacco smoker.  CT head showed right occipital infarct, new since Sept but appears chronic; chronic bilateral basal ganglia infarcts.  Neuro MD suspects small left subcortical infarct. Pt underwent speech/swallow evals after admission in Sept for CVA - WFL with no f/u recommended. Has supportive sister/friends. ?Type of Study: Bedside Swallow Evaluation ?Previous Swallow Assessment: Sept 2022 - normal swallow ?Diet Prior to this Study: NPO ?Temperature Spikes Noted: No ?Respiratory Status: Room air ?Behavior/Cognition: Alert;Cooperative ?Oral Cavity Assessment: Within Functional Limits ?Oral Care Completed by SLP: Recent completion by staff ?Oral Cavity - Dentition: Missing dentition;Poor condition ?Vision: Functional for self-feeding ?Self-Feeding Abilities: Needs assist ?Patient Positioning: Upright in bed ?Baseline Vocal Quality: Normal ?Volitional Cough: Strong ?Volitional Swallow: Able to elicit  ?  ?  Oral/Motor/Sensory Function Overall Oral Motor/Sensory Function: Mild impairment ?Facial ROM: Reduced right;Suspected CN VII (facial) dysfunction ?Facial Symmetry: Abnormal  symmetry right;Suspected CN VII (facial) dysfunction ?Lingual ROM: Reduced right;Suspected CN XII (hypoglossal) dysfunction ?Lingual Symmetry: Within Functional Limits ?Lingual Strength: Reduced;Suspected CN XII (hypoglossal) dysfunction ?Velum: Suspected CN X (Vagus) dysfunction ?Mandible: Within Functional Limits   ?Ice Chips Ice chips: Within functional limits   ?Thin Liquid Thin Liquid: Impaired ?Presentation: Straw ?Pharyngeal  Phase Impairments: Cough - Immediate  ?  ?Nectar Thick Nectar Thick Liquid: Within functional limits   ?Honey Thick Honey Thick Liquid: Not tested   ?Puree Puree: Impaired ?Presentation: Self Fed ?Oral Phase Impairments: Reduced labial seal ?Oral Phase Functional Implications: Right anterior spillage   ?Solid ? ? ?  Solid: Not tested  ? ?  ? ?Blenda Mounts Laurice ?09/11/2021,9:08 AM ? ?Keni Elison L. Max Romano, MA CCC/SLP ?Acute Rehabilitation Services ?Office number [phone] ?Pager [phone] ? ? ?

## 2021-09-11 NOTE — Progress Notes (Signed)
Patient's MRI scheduled for 0900.  ? ?Sherral Hammers RN ?

## 2021-09-11 NOTE — Progress Notes (Signed)
?  Transition of Care (TOC) Screening Note ? ? ?Patient Details  ?Name: Christopher [name] ?Date of Birth: [birth year] ? ? ?Transition of Care (TOC) CM/SW Contact:    ?Windle Guard, LCSW ?Phone Number: ?09/11/2021, 8:24 AM ? ? ? ?Transition of Care Department [facility]) has reviewed patient and noted no immediate TOC needs pending continued medical work-up. TOC team will continue to monitor patient advancement through interdisciplinary progression rounds to support any identified discharge supports as needed. If new patient transition needs arise, please place a TOC consult or reach out to [facility] team.  ?  ?

## 2021-09-11 NOTE — Progress Notes (Addendum)
STROKE TEAM PROGRESS NOTE  ? ?INTERVAL HISTORY ?No family at the bedside. Right upper extremity weakness, right leg weakness with drift.  ? ?Vitals:  ? 09/11/21 0800 09/11/21 0900 09/11/21 1000 09/11/21 1100  ?BP: 109/69 124/67 112/69 99/65  ?Pulse: 70 82 85 88  ?Resp: 19 18 19 17   ?Temp: 98.7 ?F (37.1 ?C)     ?TempSrc:      ?SpO2: 91% 91% 90% 94%  ? ?CBC:  ?Recent Labs  ?Lab 09/10/21 ?1046  ?WBC 11.8*  ?NEUTROABS 8.8*  ?HGB 13.9  ?HCT 40.1  ?MCV 90.1  ?PLT 321  ? ?Basic Metabolic Panel:  ?Recent Labs  ?Lab 09/07/21 ?1406 09/10/21 ?1046  ?NA 137 136  ?K 5.1 4.4  ?CL 99 104  ?CO2 22 24  ?GLUCOSE 95 115*  ?BUN 24 25*  ?CREATININE 1.56* 1.85*  ?CALCIUM 9.5 9.3  ? ?Lipid Panel:  ?Recent Labs  ?Lab 09/11/21 ?0234  ?CHOL 113  ?TRIG 94  ?HDL 26*  ?CHOLHDL 4.3  ?VLDL 19  ?LDLCALC 68  ? ?HgbA1c:  ?Recent Labs  ?Lab 09/10/21 ?1112  ?HGBA1C 6.2*  ? ?Urine Drug Screen: No results for input(s): LABOPIA, COCAINSCRNUR, LABBENZ, AMPHETMU, THCU, LABBARB in the last 168 hours.  ?Alcohol Level  ?Recent Labs  ?Lab 09/10/21 ?1113  ?ETH <10  ? ? ?IMAGING past 24 hours ?CT Head Wo Contrast ? ?Result Date: 09/10/2021 ?CLINICAL DATA:  Altered mental status EXAM: CT HEAD WITHOUT CONTRAST TECHNIQUE: Contiguous axial images were obtained from the base of the skull through the vertex without intravenous contrast. RADIATION DOSE REDUCTION: This exam was performed according to the departmental dose-optimization program which includes automated exposure control, adjustment of the mA and/or kV according to patient size and/or use of iterative reconstruction technique. COMPARISON:  09/10/2021 at 1101 hours FINDINGS: Brain: No evidence of acute infarction, hemorrhage, hydrocephalus, extra-axial collection or mass lesion/mass effect. Stable chronic right occipital lobe infarct. Chronic bilateral basal ganglia lacunar infarcts. Scattered low-density changes within the periventricular and subcortical white matter compatible with chronic microvascular  ischemic change. Mild diffuse cerebral volume loss. Vascular: Intravascular contrast is present. Skull: Normal. Negative for fracture or focal lesion. Sinuses/Orbits: No acute finding. Other: None. IMPRESSION: No acute intracranial abnormality.  Stable exam. Electronically Signed   By: Duanne Guess D.O.   On: 09/10/2021 12:23  ? ?MR BRAIN WO CONTRAST ? ?Result Date: 09/11/2021 ?CLINICAL DATA:  56 year old male with altered mental status. Code stroke presentation yesterday. EXAM: MRI HEAD WITHOUT CONTRAST TECHNIQUE: Multiplanar, multiecho pulse sequences of the brain and surrounding structures were obtained without intravenous contrast. COMPARISON:  Head CT x2 yesterday. Brain MRI 01/10/2021. FINDINGS: Brain: Roughly 3 cm area of cortical and subcortical white matter restricted diffusion in the posterior left MCA territory affects the perirolandic area on series 9, image 85 and series 11, image 58. Associated mildly T2 and FLAIR hyperintense cytotoxic edema there. And there is gyrus susceptibility on SWI at the motor strip on series 16, image 38. Cannot exclude petechial hemorrhage. But no regional mass effect. No other diffusion restriction. But developing encephalomalacia in the right occipital pole or is new since last year (series 9, image 76) with mild hemosiderin. Chronic bilateral deep gray matter nuclei lacunar infarcts. Heterogeneous bilateral white matter involvement in glued Ng some of the deep white matter capsules. And new conspicuous right side brainstem Wallerian degeneration since last year (series 19, image 9). No superimposed midline shift, mass effect, evidence of mass lesion, ventriculomegaly, extra-axial collection. Cervicomedullary junction and pituitary are within  normal limits. Vascular: Major intracranial vascular flow voids are stable since last year. Distal left vertebral artery appears dominant. Skull and upper cervical spine: Partially visible cervical spine degeneration. Visualized  bone marrow signal is within normal limits. Sinuses/Orbits: Stable, negative. Other: Negative visible scalp and face. IMPRESSION: 1. Acute posterior Left MCA, perirolandic infarct with possible petechial hemorrhage, but no malignant hemorrhagic transformation or mass effect. 2. Progressed right PCA territory ischemic disease since last year. And new right brainstem Wallerian degeneration. Underlying advanced chronic small vessel disease. Electronically Signed   By: Genevie Ann M.D.   On: 09/11/2021 10:32  ? ?ECHOCARDIOGRAM COMPLETE ? ?Result Date: 09/10/2021 ?   ECHOCARDIOGRAM REPORT   Patient Name:   KROIX GOETHALS Date of Exam: 09/10/2021 Medical Rec #:  [id number]        Height:       69.0 in Accession #:    [id number]       Weight:       132.0 lb Date of Birth:  12-20-1965        BSA:          1.732 m? Patient Age:    56 years         BP:           99/66 mmHg Patient Gender: M                HR:           74 bpm. Exam Location:  Inpatient Procedure: 2D Echo, Cardiac Doppler and Color Doppler Indications:    Stroke  History:        Patient has prior history of Echocardiogram examinations, most                 recent 01/10/2021. Stroke.  Sonographer:    Merrie Roof RDCS Referring Phys: [place]  1. Left ventricular ejection fraction, by estimation, is 60 to 65%. The left ventricle has normal function. The left ventricle has no regional wall motion abnormalities. Left ventricular diastolic parameters were normal.  2. Right ventricular systolic function is normal. The right ventricular size is normal. Tricuspid regurgitation signal is inadequate for assessing PA pressure.  3. The mitral valve is normal in structure. Trivial mitral valve regurgitation. No evidence of mitral stenosis.  4. The aortic valve is tricuspid. Aortic valve regurgitation is mild. Aortic valve sclerosis is present, with no evidence of aortic valve stenosis.  5. The inferior vena cava is normal in size with greater than 50%  respiratory variability, suggesting right atrial pressure of 3 mmHg. FINDINGS  Left Ventricle: Left ventricular ejection fraction, by estimation, is 60 to 65%. The left ventricle has normal function. The left ventricle has no regional wall motion abnormalities. The left ventricular internal cavity size was normal in size. There is  no left ventricular hypertrophy. Left ventricular diastolic parameters were normal. Right Ventricle: The right ventricular size is normal. Right ventricular systolic function is normal. Tricuspid regurgitation signal is inadequate for assessing PA pressure. The tricuspid regurgitant velocity is 2.30 m/s, and with an assumed right atrial  pressure of 3 mmHg, the estimated right ventricular systolic pressure is [id number] mmHg. Left Atrium: Left atrial size was normal in size. Right Atrium: Right atrial size was normal in size. Pericardium: There is no evidence of pericardial effusion. Mitral Valve: The mitral valve is normal in structure. Trivial mitral valve regurgitation. No evidence of mitral valve stenosis. Tricuspid Valve: The tricuspid valve is normal in structure.  Tricuspid valve regurgitation is mild . No evidence of tricuspid stenosis. Aortic Valve: The aortic valve is tricuspid. Aortic valve regurgitation is mild. Aortic valve sclerosis is present, with no evidence of aortic valve stenosis. Aortic valve mean gradient measures 3.0 mmHg. Aortic valve peak gradient measures 6.0 mmHg. Aortic valve area, by VTI measures 3.32 cm?. Pulmonic Valve: The pulmonic valve was normal in structure. Pulmonic valve regurgitation is not visualized. No evidence of pulmonic stenosis. Aorta: The aortic root is normal in size and structure. Venous: The inferior vena cava is normal in size with greater than 50% respiratory variability, suggesting right atrial pressure of 3 mmHg. IAS/Shunts: No atrial level shunt detected by color flow Doppler.  LEFT VENTRICLE PLAX 2D LVIDd:         4.00 cm   Diastology  LVIDs:         2.60 cm   LV e' medial:    8.41 cm/s LV PW:         1.00 cm   LV E/e' medial:  9.0 LV IVS:        1.00 cm   LV e' lateral:   11.00 cm/s LVOT diam:     2.10 cm   LV E/e' lateral: 6.9 LV SV:         86 LV S

## 2021-09-11 NOTE — Evaluation (Signed)
Physical Therapy Evaluation ?Patient Details ?Name: Christopher [name] ?MRN: [id number] ?DOB: [birth year] ?Today's Date: 09/11/2021 ? ?History of Present Illness ? 56 y.o. male presents to [facility] hospital on 09/10/2021 with acute R weakenss and slurred speech. CT head negative. Pt received TNK. MRI demonstrates posterior L MCA infarct. PMH includes HTN, HLD, CVA, PUD, hx of cocaine abuse.  ?Clinical Impression ? Pt presents to PT with deficits in strength, power, functional mobility, balance, communication. Pt with R sided weakness, UE more significant than LE. Pt demonstrates increased sway in standing and benefits from UE support as well as physical assist to correct balance deviations. Pt will benefit from continued aggressive mobilization in an effort to restore his prior level of function. PT will need to confirm history and PLOF with family as pt largely communicating through head nods and gestures at this time. PT recommends AIR at this time.   ?   ? ?Recommendations for follow up therapy are one component of a multi-disciplinary discharge planning process, led by the attending physician.  Recommendations may be updated based on patient status, additional functional criteria and insurance authorization. ? ?Follow Up Recommendations Acute inpatient rehab (3hours/day) ? ?  ?Assistance Recommended at Discharge Frequent or constant Supervision/Assistance  ?Patient can return home with the following ? A little help with walking and/or transfers;A little help with bathing/dressing/bathroom;Assistance with cooking/housework;Direct supervision/assist for medications management;Direct supervision/assist for financial management;Assist for transportation;Help with stairs or ramp for entrance ? ?  ?Equipment Recommendations  (TBD pending confirmation of DME in the home)  ?Recommendations for Other Services ? Rehab consult  ?  ?Functional Status Assessment Patient has had a recent decline in their functional status and demonstrates  the ability to make significant improvements in function in a reasonable and predictable amount of time.  ? ?  ?Precautions / Restrictions Precautions ?Precautions: Fall ?Precaution Comments: expressive aphasia ?Restrictions ?Weight Bearing Restrictions: No  ? ?  ? ?Mobility ? Bed Mobility ?Overal bed mobility: Needs Assistance ?Bed Mobility: Supine to Sit ?  ?  ?Supine to sit: Supervision, HOB elevated ?  ?  ?  ?  ? ?Transfers ?Overall transfer level: Needs assistance ?Equipment used:  (railing) ?Transfers: Sit to/from Stand ?Sit to Stand: Min guard ?  ?  ?  ?  ?  ?  ?  ? ?Ambulation/Gait ?Ambulation/Gait assistance: Min assist ?Gait Distance (Feet): 40 Feet ?Assistive device: 1 person hand held assist ?Gait Pattern/deviations: Step-through pattern, Drifts right/left ?Gait velocity: reduced ?Gait velocity interpretation: <1.8 ft/sec, indicate of risk for recurrent falls ?  ?General Gait Details: pt with increased lateral drift, requires hand hold and minA at trunk to maintain balance ? ?Stairs ?  ?  ?  ?  ?  ? ?Wheelchair Mobility ?  ? ?Modified Rankin (Stroke Patients Only) ?Modified Rankin (Stroke Patients Only) ?Pre-Morbid Rankin Score: No symptoms ?Modified Rankin: Moderately severe disability ? ?  ? ?Balance Overall balance assessment: Needs assistance ?Sitting-balance support: No upper extremity supported, Feet supported ?Sitting balance-Leahy Scale: Fair ?  ?  ?Standing balance support: Single extremity supported, Reliant on assistive device for balance ?Standing balance-Leahy Scale: Poor ?  ?  ?  ?  ?  ?  ?  ?  ?  ?  ?  ?  ?   ? ? ? ?Pertinent Vitals/Pain Pain Assessment ?Pain Assessment: No/denies pain  ? ? ?Home Living Family/patient expects to be discharged to:: Private residence ?Living Arrangements: Other relatives (sister) ?Available Help at Discharge: Available 24 hours/day;Family ?Type of Home:  House ?Home Access: Stairs to enter ?Entrance Stairs-Rails: Left ?Entrance Stairs-Number of Steps: 3 ?   ?Home Layout: One level ?Home Equipment: Kasandra Knudsen - single point ?Additional Comments: need to confirm history. pt answering with yes/no head nods and may be unreliable due to aphasia  ?  ?Prior Function Prior Level of Function : Independent/Modified Independent ?  ?  ?  ?  ?  ?  ?Mobility Comments: pt indicates he was ambulating with a SPC ?ADLs Comments: Pt reports his sister assists with cooking and cleaning ?  ? ? ?Hand Dominance  ? Dominant Hand: Left ? ?  ?Extremity/Trunk Assessment  ? Upper Extremity Assessment ?Upper Extremity Assessment: Defer to OT evaluation ?  ? ?Lower Extremity Assessment ?Lower Extremity Assessment: RLE deficits/detail ?RLE Deficits / Details: grossly 4/5 strength ?  ? ?Cervical / Trunk Assessment ?Cervical / Trunk Assessment: Normal  ?Communication  ? Communication: Expressive difficulties  ?Cognition Arousal/Alertness: Awake/alert ?Behavior During Therapy: Flat affect ?Overall Cognitive Status: Difficult to assess ?  ?  ?  ?  ?  ?  ?  ?  ?  ?  ?  ?  ?  ?  ?  ?  ?General Comments: pt communicates with head nods, does point to his first name which it appears he wrote on paper in the room. Follows commands well ?  ?  ? ?  ?General Comments General comments (skin integrity, edema, etc.): VSS on RA ? ?  ?Exercises    ? ?Assessment/Plan  ?  ?PT Assessment Patient needs continued PT services  ?PT Problem List Decreased strength;Decreased activity tolerance;Decreased balance;Decreased mobility ? ?   ?  ?PT Treatment Interventions DME instruction;Gait training;Stair training;Functional mobility training;Therapeutic activities;Therapeutic exercise;Balance training;Neuromuscular re-education;Cognitive remediation;Patient/family education   ? ?PT Goals (Current goals can be found in the Care Plan section)  ?Acute Rehab PT Goals ?Patient Stated Goal: to return to independence ?PT Goal Formulation: With patient ?Time For Goal Achievement: 09/25/21 ?Potential to Achieve Goals: Good ? ?  ?Frequency Min  4X/week ?  ? ? ?Co-evaluation   ?  ?  ?  ?  ? ? ?  ?AM-PAC PT "6 Clicks" Mobility  ?Outcome Measure Help needed turning from your back to your side while in a flat bed without using bedrails?: A Little ?Help needed moving from lying on your back to sitting on the side of a flat bed without using bedrails?: A Little ?Help needed moving to and from a bed to a chair (including a wheelchair)?: A Little ?Help needed standing up from a chair using your arms (e.g., wheelchair or bedside chair)?: A Little ?Help needed to walk in hospital room?: A Little ?Help needed climbing 3-5 steps with a railing? : Total ?6 Click Score: 16 ? ?  ?End of Session   ?Activity Tolerance: Patient tolerated treatment well ?Patient left: in chair;with call bell/phone within reach;with chair alarm set ?Nurse Communication: Mobility status ?PT Visit Diagnosis: Other abnormalities of gait and mobility (R26.89);Other symptoms and signs involving the nervous system (R29.898) ?  ? ?Time: [id number] ?PT Time Calculation (min) (ACUTE ONLY): 18 min ? ? ?Charges:   PT Evaluation ?$PT Eval Low Complexity: 1 Low ?  ?  ?   ? ? ?Zenaida Niece, PT, DPT ?Acute Rehabilitation ?Pager: [phone] ?Office [phone] ? ? ?Zenaida Niece ?09/11/2021, 2:58 PM ? ?

## 2021-09-11 NOTE — Progress Notes (Signed)
Occupational Therapy Treatment ?Patient Details ?Name: Christopher [name] ?MRN: [id number] ?DOB: [birth year] ?Today's Date: 09/11/2021 ? ? ?History of present illness 56 y.o. male presents to [facility] hospital on 09/10/2021 with acute R weakenss and slurred speech. CT head negative. Pt received TNK. MRI demonstrates posterior L MCA infarct. PMH includes HTN, HLD, CVA, PUD, hx of cocaine abuse. ?  ?OT comments ? Christopher [name] was evaluated s/p the above admission list, he is experiencing expressive communication difficulties therefore PLOF and home set up were difficult to confirm however - pt seemingly mod I for mobility and ADLs at baseline. Upon arrival pt was self feeding but noted to have difficulty with food all over his face, beard and gown. Assisted pt with clean up. He requires min A for all UB tasks and mod A for LB ADLs, cues for compensatory techniques. He can complete simple transfers with min G and take pivotal steps with min A. He is limited R hemiparesis, general weakness, decreased balance and activity tolerance and impaired communication. Pt ill benefit from OT acutely. Recommend d/c to AIR for maximal fucnitonal progress towards baseline prior to d/c home.   ? ?Recommendations for follow up therapy are one component of a multi-disciplinary discharge planning process, led by the attending physician.  Recommendations may be updated based on patient status, additional functional criteria and insurance authorization. ?   ?Follow Up Recommendations ? Acute inpatient rehab (3hours/day)  ?  ?Assistance Recommended at Discharge Frequent or constant Supervision/Assistance  ?Patient can return home with the following ? A little help with walking and/or transfers;A lot of help with bathing/dressing/bathroom;Assistance with cooking/housework;Assistance with feeding;Direct supervision/assist for medications management;Assist for transportation;Help with stairs or ramp for entrance ?  ?Equipment Recommendations ?  (defer)  ?   ?Recommendations for Other Services Rehab consult ? ?  ?Precautions / Restrictions Precautions ?Precautions: Fall ?Precaution Comments: expressive aphasia ?Restrictions ?Weight Bearing Restrictions: No  ? ? ?  ? ?Mobility Bed Mobility ?  ?  ?  ?  ?  ?  ?  ?General bed mobility comments: in chiar upon arrival ?  ? ?Transfers ?Overall transfer level: Needs assistance ?  ?Transfers: Sit to/from Stand ?Sit to Stand: Min guard ?  ?  ?  ?  ?  ?  ?  ?  ?Balance Overall balance assessment: Needs assistance ?Sitting-balance support: No upper extremity supported, Feet supported ?Sitting balance-Leahy Scale: Fair ?  ?  ?Standing balance support: Single extremity supported, Reliant on assistive device for balance ?Standing balance-Leahy Scale: Poor ?  ?  ?  ?  ?  ?  ?  ?  ?  ?  ?  ?  ?   ? ?ADL either performed or assessed with clinical judgement  ? ?ADL Overall ADL's : Needs assistance/impaired ?Eating/Feeding: Set up;Sitting ?Eating/Feeding Details (indicate cue type and reason): eating upon arrival, food soiled face, beard and gown ?Grooming: Minimal assistance;Sitting ?Grooming Details (indicate cue type and reason): min A for thoroughness ?Upper Body Bathing: Minimal assistance;Sitting ?  ?Lower Body Bathing: Moderate assistance;Sit to/from stand ?  ?Upper Body Dressing : Minimal assistance;Sitting;Cueing for compensatory techniques ?Upper Body Dressing Details (indicate cue type and reason): cues to place RUE first ?Lower Body Dressing: Maximal assistance;Sit to/from stand ?  ?Toilet Transfer: Minimal assistance;Stand-pivot ?  ?Toileting- Clothing Manipulation and Hygiene: Minimal assistance;Sit to/from stand ?  ?  ?  ?Functional mobility during ADLs: Minimal assistance ?General ADL Comments: limited by expressive communication difficulties, R weakness and limited ROM ?  ? ?Extremity/Trunk Assessment  Upper Extremity Assessment ?Upper Extremity Assessment: LUE deficits/detail;RUE deficits/detail ?RUE Deficits / Details:  weaker distally, able to use extremity as a funcitonal assist. full PROM. poor coordination ?RUE Coordination: decreased fine motor;decreased gross motor ?LUE Deficits / Details: WFL but generally weak - pt is LHD ?  ?Lower Extremity Assessment ?Lower Extremity Assessment: Defer to PT evaluation ?RLE Deficits / Details: grossly 4/5 strength ?  ?Cervical / Trunk Assessment ?Cervical / Trunk Assessment: Normal ?  ? ?Vision Baseline Vision/History: 1 Wears glasses ?Ability to See in Adequate Light: 0 Adequate ?Vision Assessment?: No apparent visual deficits ?  ?   ?   ? ?Cognition Arousal/Alertness: Awake/alert ?Behavior During Therapy: Flat affect ?Overall Cognitive Status: Difficult to assess ?  ?  ?  ?  ?  ?  ?  ?  ?  ?  ?  ?  ?  ?  ?  ?  ?General Comments: pt communicates with head nods, does point to his first name which it appears he wrote on paper in the room. Follows commands well ?  ?  ?   ?   ?   ?General Comments VSS on RA  ? ? ?Pertinent Vitals/ Pain       Pain Assessment ?Pain Assessment: No/denies pain ? ?Home Living Family/patient expects to be discharged to:: Private residence ?Living Arrangements: Other relatives ?Available Help at Discharge: Available 24 hours/day;Family ?Type of Home: House ?Home Access: Stairs to enter ?Entrance Stairs-Number of Steps: 3 ?Entrance Stairs-Rails: Left ?Home Layout: One level ?  ?  ?  ?  ?  ?  ?  ?Home Equipment: Gilmer MorCane - single point ?  ?Additional Comments: need to confirm history. pt answering with yes/no head nods and may be unreliable due to aphasia ?  ? ?  ?Prior Functioning/Environment    ?  ?  ?  ?   ? ?Frequency ? Min 2X/week  ? ? ? ? ?  ?Progress Toward Goals ? ?OT Goals(current goals can now be found in the care plan section) ?   ? ?Acute Rehab OT Goals ?Patient Stated Goal: unable to state ?OT Goal Formulation: With patient ?Time For Goal Achievement: 09/25/21 ?Potential to Achieve Goals: Good ?ADL Goals ?Pt Will Perform Grooming: with  supervision;standing ?Pt Will Perform Lower Body Dressing: with set-up;sit to/from stand ?Pt Will Transfer to Toilet: with supervision;ambulating ?Additional ADL Goal #1: pt will use RUE as a funcitonal assist to complete grooming tasks with supervision A  ?Plan     ? ?   ?AM-PAC OT "6 Clicks" Daily Activity     ?Outcome Measure ? ? Help from another person eating meals?: A Little ?Help from another person taking care of personal grooming?: A Little ?Help from another person toileting, which includes using toliet, bedpan, or urinal?: A Little ?Help from another person bathing (including washing, rinsing, drying)?: A Lot ?Help from another person to put on and taking off regular upper body clothing?: A Little ?Help from another person to put on and taking off regular lower body clothing?: A Lot ?6 Click Score: 16 ? ?  ?End of Session   ? ?OT Visit Diagnosis: Unsteadiness on feet (R26.81);Other abnormalities of gait and mobility (R26.89);Muscle weakness (generalized) (M62.81);Hemiplegia and hemiparesis ?Hemiplegia - Right/Left: Right ?Hemiplegia - dominant/non-dominant: Non-Dominant ?Hemiplegia - caused by: Cerebral infarction ?  ?Activity Tolerance Patient tolerated treatment well ?  ?Patient Left in chair;with call bell/phone within reach;with chair alarm set ?  ?Nurse Communication Mobility status ?  ? ?   ? ?  Time: 8325-4982 ?OT Time Calculation (min): 17 min ? ?Charges: OT General Charges ?$OT Visit: 1 Visit ?OT Evaluation ?$OT Eval Moderate Complexity: 1 Mod ? ? ?Amerah Puleo A Bertrand Vowels ?09/11/2021, 4:24 PM ?

## 2021-09-11 NOTE — Progress Notes (Signed)
OT Cancellation Note ? ?Patient Details ?Name: Christopher [name] ?MRN: [id number] ?DOB: 10-28-1965 ? ? ?Cancelled Treatment:    Reason Eval/Treat Not Completed: Active bedrest order ? ?[name] ?09/11/2021, 8:10 AM ?

## 2021-09-11 NOTE — Progress Notes (Signed)
Inpatient Rehab Admissions Coordinator Note:  ? ?Per therapy patient was screened for CIR candidacy by Mairim Bade Luvenia Starch, CCC-SLP. At this time, pt appears to be a potential candidate for CIR. I will place an order for rehab consult for full assessment, per our protocol.  Please contact me any with questions.. ? ? ?Wolfgang Phoenix, MS, CCC-SLP ?Admissions Coordinator ?[phone] ?09/11/21 ?6:17 PM ? ?

## 2021-09-12 ENCOUNTER — Inpatient Hospital Stay (HOSPITAL_COMMUNITY): Payer: Medicaid Other

## 2021-09-12 MED ORDER — PANTOPRAZOLE SODIUM 40 MG PO TBEC
40.0000 mg | DELAYED_RELEASE_TABLET | Freq: Every day | ORAL | Status: DC
  Administered 2021-09-12 – 2021-09-14 (×3): 40 mg via ORAL
  Filled 2021-09-12 (×3): qty 1

## 2021-09-12 NOTE — Progress Notes (Signed)
Inpatient Rehabilitation Admissions Coordinator  ? ?I met with patient at bedside and spoke with his sister by phone. She can provide caregiver supports. She prefers Cir admit as he was previously admitted 9/22 and went home with her. I await Cir bed availability and medical clearance to admit this week. ? ?Danne Baxter, RN, MSN ?Rehab Admissions Coordinator ?[phone] ?09/12/2021 11:27 AM ? ?

## 2021-09-12 NOTE — Progress Notes (Signed)
Speech Language Pathology Treatment: Dysphagia  ?Patient Details ?Name: Christopher [name] ?MRN: [id number] ?DOB: 1965/07/23 ?Today's Date: 09/12/2021 ?Time: [id number] ?SLP Time Calculation (min) (ACUTE ONLY): 15 min ? ?Assessment / Plan / Recommendation ?Clinical Impression ? Pt was seen for dysphagia treatment. Pt was educated regarding the results of the modified barium swallow study, diet recommendations, and swallowing precautions. Video recording of the study was used to facilitate education and pt verbalized understanding regarding all areas of education. Pt was able to demonstrate understanding using teach back. Pt consistently used the chin tuck posture with thin liquids with occasional cues and no s/sx of aspiration were noted when compensatory strategies were used. Pt tolerated meds given whole with puree without overt s/sx of aspiration or significant oral residue. Pt was educated regarding compensatory strategies for speech intelligibility and he required frequent prompts and cues at the word level. A dysphagia 2 diet with thin liquids is still recommended with observance or swallowing precautions. Full/close intermittent supervision may be necessary initially to ensure consistent observance. SLP will continue to follow pt.   ?  ?HPI HPI: Christopher [name] is a 56 y.o. male who presented to the [facility] ED for acute onset of right side weakness and dysarthria. TNK given. PMHx HTN, HLD, CVA, PUD, hx of cocaine abuse (as recently as five days PTA) and current tobacco smoker.  CT head showed right occipital infarct, new since Sept but appears chronic; chronic bilateral basal ganglia infarcts.  Neuro MD suspects small left subcortical infarct. Pt underwent speech/swallow evals after admission in Sept for CVA - WFL with no f/u recommended. Has supportive sister/friends. ?  ?   ?SLP Plan ? Continue with current plan of care ? ?  ?  ?Recommendations for follow up therapy are one component of a multi-disciplinary discharge  planning process, led by the attending physician.  Recommendations may be updated based on patient status, additional functional criteria and insurance authorization. ?  ? ?Recommendations  ?Diet recommendations: Dysphagia 2 (fine chop);Thin liquid ?Liquids provided via: Cup;No straw ?Medication Administration: Whole meds with puree ?Supervision: Full supervision/cueing for compensatory strategies ?Compensations: Slow rate;Small sips/bites;Chin tuck;Follow solids with liquid ?Postural Changes and/or Swallow Maneuvers: Seated upright 90 degrees  ?   ?    ?   ? ? ? ? Oral Care Recommendations: Oral care BID ?Follow Up Recommendations: Acute inpatient rehab (3hours/day) ?Assistance recommended at discharge: Frequent or constant Supervision/Assistance ?SLP Visit Diagnosis: Dysphagia, oropharyngeal phase (R13.12) ?Plan: Continue with current plan of care ? ? ? ? ?  ?  ?Lynette Noah I. Hardin Negus, [place], CCC-SLP ?Acute Rehabilitation Services ?Office number [phone] ?Pager [phone] ? ? ?[name] ? ?09/12/2021, 1:21 PM ? ? ? ?

## 2021-09-12 NOTE — Progress Notes (Signed)
STROKE TEAM PROGRESS NOTE  ? ?INTERVAL HISTORY ?No family at the bedside.  Neurological exam is unchanged with mild right upper extremity weakness, right leg weakness with drift.  Vital signs stable.  Therapist recommend inpatient rehab ? ?Vitals:  ? 09/12/21 0900 09/12/21 1100 09/12/21 1125 09/12/21 1233  ?BP:   118/69   ?Pulse: 81 80 79 79  ?Resp: 18 (!) 22 (!) 22 (!) 21  ?Temp:    97.8 ?F (36.6 ?C)  ?TempSrc:    Axillary  ?SpO2: 91% 95% 94% 94%  ? ?CBC:  ?Recent Labs  ?Lab 09/10/21 ?1046  ?WBC 11.8*  ?NEUTROABS 8.8*  ?HGB 13.9  ?HCT 40.1  ?MCV 90.1  ?PLT 321  ? ?Basic Metabolic Panel:  ?Recent Labs  ?Lab 09/07/21 ?1406 09/10/21 ?1046  ?NA 137 136  ?K 5.1 4.4  ?CL 99 104  ?CO2 22 24  ?GLUCOSE 95 115*  ?BUN 24 25*  ?CREATININE 1.56* 1.85*  ?CALCIUM 9.5 9.3  ? ?Lipid Panel:  ?Recent Labs  ?Lab 09/11/21 ?0234  ?CHOL 113  ?TRIG 94  ?HDL 26*  ?CHOLHDL 4.3  ?VLDL 19  ?[place] 68  ? ?HgbA1c:  ?Recent Labs  ?Lab 09/10/21 ?1112  ?HGBA1C 6.2*  ? ?Urine Drug Screen: No results for input(s): LABOPIA, COCAINSCRNUR, LABBENZ, AMPHETMU, THCU, LABBARB in the last 168 hours.  ?Alcohol Level  ?Recent Labs  ?Lab 09/10/21 ?1113  ?ETH <10  ? ? ?IMAGING past 24 hours ?DG Swallowing Func-Speech Pathology ? ?Result Date: 09/12/2021 ?Table formatting from the original result was not included. Objective Swallowing Evaluation: Type of Study: MBS-Modified Barium Swallow Study  Patient Details Name: Christopher [name] MRN: [id number] Date of Birth: Feb 18, 1966 Today's Date: 09/12/2021 Time: SLP Start Time (ACUTE ONLY): 1218 -SLP Stop Time (ACUTE ONLY): 1010 SLP Time Calculation (min) (ACUTE ONLY): 20 min Past Medical History: Past Medical History: Diagnosis Date  PUD (peptic ulcer disease) 2008  treated with meds Past Surgical History: No past surgical history on file. HPI: Christopher [name] is a 56 y.o. male who presented to the [facility] ED for acute onset of right side weakness and dysarthria. TNK given. PMHx HTN, HLD, CVA, PUD, hx of cocaine abuse (as  recently as five days PTA) and current tobacco smoker.  CT head showed right occipital infarct, new since Sept but appears chronic; chronic bilateral basal ganglia infarcts.  Neuro MD suspects small left subcortical infarct. Pt underwent speech/swallow evals after admission in Sept for CVA - WFL with no f/u recommended. Has supportive sister/friends.  Subjective: alert, participatory  Recommendations for follow up therapy are one component of a multi-disciplinary discharge planning process, led by the attending physician.  Recommendations may be updated based on patient status, additional functional criteria and insurance authorization. Assessment / Plan / Recommendation   09/12/2021   1:11 PM Clinical Impressions Clinical Impression Pt presents with oropharyngeal dysphagia characterized by reduced labeal seal, reduced pharyngeal constriction, and a pharyngeal delay. He demonstrated difficulty sucking liquids from a straw, mild right-sided anterior spillage, vallecular residue, and posterior pharyngeal wall residue. Pharyngeal residue was partly facilitated by cervical osteophytes/degenerative changes. Residue was improved with secondary swallows, and eliminated with a liquid wash. He exhibited aspiration of thin liquids (PAS 7) and coughing was ineffective during instances of aspiration with thin liquids. Laryngeal invasion was eliminated or improved to PAS 2 with use of a chin tuck posture. Penetration (PAS 3) and aspiration (PAS 7) were noted once with a large bolus of nectar thick liquids via cup. Aspiration of nectar  thick liquids triggered a much more subtle response of throat clearing and slight cough which were both ineffective. Prompts to improve cough strength resulted in pt propelling most of the aspirate from just below the vocal folds to the laryngeal surface of the epiglottis. Mastication was prolonged with regular texture solids and effectiveness is questioned due to dentition and observed bolus  movement. A dysphagia 2 diet with thin liquids is recommended with strict observance of swallowing precautions. SLP will follow for dysphagia treatment. SLP Visit Diagnosis Dysphagia, oropharyngeal phase (R13.12) Impact on safety and function Mild aspiration risk     09/12/2021   1:11 PM Treatment Recommendations Treatment Recommendations Therapy as outlined in treatment plan below     09/12/2021   1:11 PM Prognosis Prognosis for Safe Diet Advancement Good   09/12/2021   1:11 PM Diet Recommendations SLP Diet Recommendations Dysphagia 2 (Fine chop) solids;Thin liquid Medication Administration Whole meds with puree Compensations Slow rate;Small sips/bites;Chin tuck;Follow solids with liquid     09/12/2021   1:11 PM Other Recommendations Oral Care Recommendations Staff/trained caregiver to provide oral care;Oral care BID Follow Up Recommendations Acute inpatient rehab (3hours/day) Assistance recommended at discharge Frequent or constant Supervision/Assistance Functional Status Assessment Patient has had a recent decline in their functional status and demonstrates the ability to make significant improvements in function in a reasonable and predictable amount of time.   09/12/2021   1:11 PM Frequency and Duration  Speech Therapy Frequency (ACUTE ONLY) min 2x/week Treatment Duration 2 weeks     09/12/2021  10:17 AM Oral Phase Oral Phase Impaired Oral - Thin Cup Right anterior bolus loss Oral - Thin Straw -- Oral - Puree WFL Oral - Mech Soft Impaired mastication Oral - Regular Impaired mastication Oral - Pill [facility]    09/12/2021   1:11 PM Pharyngeal Phase Pharyngeal Material enters airway, remains ABOVE vocal cords and not ejected out;Material enters airway, passes BELOW cords and not ejected out despite cough attempt by patient Pharyngeal Material enters airway, passes BELOW cords and not ejected out despite cough attempt by patient    09/12/2021  10:17 AM Cervical Esophageal Phase  Cervical Esophageal Phase [facility] Shanika I. Hardin Negus, [place],  [place] Office number [phone] Pager [phone] [name] 09/12/2021, 1:11 PM                     ? ?PHYSICAL EXAM ?Physical Exam  ?Constitutional: Frail malnourished looking middle-aged Caucasian male.  Not in distress.   ?Psych: Affect appropriate to situation ?Cardiovascular: Normal rate and regular rhythm.  ?Respiratory: Effort normal and breath sounds normal to anterior ascultation ?  ?Neuro: ?Mental Status: ?Patient is awake, alert, oriented to person, place, month, year, and situation. Speech is very dysarthric.  ?Patient is able to give a clear and coherent history. ?No signs of aphasia or neglect ?Cranial Nerves: ?II: Visual Fields are full. Pupils are equal, round, and reactive to light.   ?III,IV, VI: EOMI without ptosis or diploplia.  ?V: Facial sensation is symmetric to temperature ?VII: right facial weakness ?VIII: hearing is intact to voice ?X: Uvula elevates symmetrically ?XI: Shoulder shrug is symmetric. ?XII: tongue is midline without atrophy or fasciculations.  ?Motor: ?Tone is normal. Bulk is normal. Right arm 4/5, Right leg  5/5. 5/5 strength in left upper and lower extremity ?Sensory: ? Decreased on right side  ?Cerebellar: ?FNF and HKS are intact bilaterally ?  ? ?ASSESSMENT/PLAN ?Christopher [name] is a 56 y.o. male with history of  HTN, HLD, CVA, PUD, hx of cocaine abuse and current tobacco smoker who presents to the [facility] ED for acute onset of right side weakness and slurred speech.  ? 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 No acute cortically based infarct or acute intracranial hemorrhage identified ?CTA head & neck Positive for new short segment occlusion of the Right Vertebral Artery V1 segment which has progressed since September. Reconstituted and diminutive but patent Right Vertebral.  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 ?MRI - Acute posterior Left MCA, perirolandic infarct with possible petechial hemo

## 2021-09-12 NOTE — PMR Pre-admission (Signed)
PMR Admission Coordinator Pre-Admission Assessment ? ?Patient: Christopher [name] is an 56 y.o., male ?MRN: [id number] ?DOB: Sep 11, 1965 ?Height:   ?Weight:   ? ?Insurance Information ?HMO:     PPO:      PCP:      IPA:      80/20:      OTHER:  ?PRIMARY: Uninsured ? ?Estimate of Cost of care provided to patient and sister   ? ?Financial Counselor:       Phone#:  ? ?The ?Data Collection Information Summary? for patients in Inpatient Rehabilitation Facilities with attached ?Privacy Act [place] Records? was provided and verbally reviewed with: N/A ? ?Emergency Contact Information ?Contact Information   ? ? Name Relation Home Work Mobile  ? [place],Peggy Sister   [phone]  ? ?  ? ?Current Medical History  ?Patient Admitting Diagnosis: CVA ? 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  ? ?CT head with right occipital infarct new since September, but appeared to be chronic. CTA head and neck positive for new short segment occlusion of the fight vertebral artery V1 segment which has progressed since September. Reconstituted and diminutive but patent right vertebral. 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 MRI acute posterior left MCA, peri rolandic infarct with possible petechial hemorrhage , but no malignant hemorrhagic transforation or mass effect. LDL 68, Hgb A1c 6.2. VTE prophylaxis Lovenox. No antithrombotic pta , now on Asa. Home Meds of Lisinopril . To resume home atorvastatin.  ? ?Complete NIHSS TOTAL: 4 ? ?Patient's medical record from [facility] has been reviewed by the rehabilitation admission coordinator and physician. ? ?Past Medical History  ?Past Medical History:  ?Diagnosis Date  ? PUD (peptic ulcer disease) 2008  ? treated with meds  ? ?Has the patient had major surgery during 100 days prior to admission?  No ? ?Family History   ?family history includes Cancer in his mother; Stroke in his father. ? ?Current Medications ? ?Current Facility-Administered Medications:  ?   stroke: early stages of recovery book, , Does not apply, Once, Beulah Gandy A, NP ?  0.9 %  sodium chloride infusion, , Intravenous, Continuous, August Albino, NP, Last Rate: 50 mL/hr at 09/13/21 2102, New Bag at 09/13/21 2102 ?  acetaminophen (TYLENOL) tablet 650 mg, 650 mg, Oral, Q4H PRN **OR** acetaminophen (TYLENOL) 160 MG/5ML solution 650 mg, 650 mg, Per Tube, Q4H PRN **OR** acetaminophen (TYLENOL) suppository 650 mg, 650 mg, Rectal, Q4H PRN, Beulah Gandy A, NP ?  aspirin tablet 325 mg, 325 mg, Oral, Daily, Shafer, Devon, NP, 325 mg at 09/13/21 [id number] ?  Chlorhexidine Gluconate Cloth 2 % PADS 6 each, 6 each, Topical, Daily, Janine Ores, NP, 6 each at 09/13/21 0947 ?  food thickener (SIMPLYTHICK (NECTAR/LEVEL 2/MILDLY THICK)) 10 packet, 10 packet, Oral, PRN, Kerney Elbe, MD ?  labetalol (NORMODYNE) injection 10 mg, 10 mg, Intravenous, Q15 min PRN, Beulah Gandy A, NP ?  pantoprazole (PROTONIX) EC tablet 40 mg, 40 mg, Oral, Daily, Mignon Pine, RPH, 40 mg at 09/13/21 [id number] ?  senna-docusate (Senokot-S) tablet 1 tablet, 1 tablet, Oral, QHS PRN, Beulah Gandy A, NP ?  sodium chloride flush (NS) 0.9 % injection 3 mL, 3 mL, Intravenous, Once, Kommor, Madison, MD ? ?Patients Current Diet:  ?Diet Order   ? ?       ?  DIET DYS 2 Room service appropriate? Yes with Assist; Fluid consistency: Thin  Diet effective now       ?  ? ?  ?  ? ?  ? ?Precautions / Restrictions ?Precautions ?Precautions: Fall ?Precaution Comments: expressive aphasia ?Restrictions ?Weight Bearing Restrictions: No  ? ?Has the patient had 2 or more falls or a fall with injury in the past year? No ? ?Prior Activity Level ?Community (5-7x/wk): Mod I with SPC ? ?Prior Functional Level ?Self Care: Did the patient need help bathing, dressing, using the toilet or eating?  Independent ? ?Indoor Mobility: Did the patient need assistance with walking from room to room (with or without device)? Independent ? ?Stairs: Did the patient need assistance with internal or external stairs (with or without device)? Independent ? ?Functional Cognition: Did the patient need help planning regular tasks such as shopping or remembering to take medications? Independent ? ?Patient Information ?Are you of Hispanic, Latino/a,or Spanish origin?: A. No, not of Hispanic, Latino/a, or Spanish origin ?What is your race?: A. White ?Do you need or want an interpreter to communicate with a doctor or health care staff?: 0. No ? ?Patient's Response To:  ?Health Literacy and Transportation ?Is the patient able to respond to health literacy and transportation needs?: Yes ?Health Literacy - How often do you need to have someone help you when you read instructions, pamphlets, or other written material from your doctor or pharmacy?: Sometimes ?In the past 12 months, has lack of transportation kept you from medical appointments or from getting medications?: No ?In the past 12 months, has lack of transportation kept you from meetings, work, or from getting things needed for daily living?: No ? ?Home Assistive Devices / Equipment ?Home Equipment: Kasandra Knudsen - single point ? ?Prior Device Use: Indicate devices/aids used by the patient prior to current illness, exacerbation or injury?  SPC ? ?Current Functional Level ?Cognition ? Arousal/Alertness: Awake/alert ?Overall Cognitive Status: Difficult to assess ?Difficult to assess due to: Impaired communication ?Orientation Level: Oriented X4 ?General Comments: follows multistep commands with increased cuing, single step commands consistently ?Attention: Sustained ?Sustained Attention: Appears intact ?Memory: Appears intact ?   ?Extremity Assessment ?(includes Sensation/Coordination) ? Upper Extremity Assessment: LUE deficits/detail ?RUE Deficits / Details: weaker distally, able to  use extremity as a funcitonal assist. full PROM. poor coordination ?RUE Coordination: decreased fine motor, decreased gross motor ?LUE Deficits / Details: WFL but generally weak - pt is LHD  ?Lower Extremity Assessment: Defer to PT evaluation ?RLE Deficits / Details: grossly 4/5 strength  ?  ?ADLs ? Overall ADL's : Needs assistance/impaired ?Eating/Feeding: Set up, Sitting ?Eating/Feeding Details (indicate cue type and reason): eating upon arrival, food soiled face, beard and gown ?Grooming: Minimal assistance, Sitting ?Grooming Details (indicate cue type and reason): min A for thoroughness ?Upper Body Bathing: Minimal assistance, Sitting ?Lower Body Bathing: Moderate assistance, Sit to/from stand ?Upper Body Dressing : Minimal assistance, Sitting, Cueing for compensatory techniques ?Upper Body Dressing Details (indicate cue type and reason): cues to place RUE first ?Lower Body Dressing: Moderate assistance, Sitting/lateral leans ?Lower Body Dressing Details (indicate cue type and reason): to don sock ?Toilet Transfer: Min guard, Ambulation, Regular Toilet ?Toilet Transfer Details (indicate cue type and reason): simulated in room ?Toileting- Clothing Manipulation and Hygiene: Minimal assistance, Sit to/from stand ?Functional mobility during ADLs: Min guard ?General ADL Comments: limited by expressive communication difficulties, R weakness and limited ROM  ?  ?Mobility ? Overal bed mobility: Needs Assistance ?Bed Mobility: Rolling, Sidelying to Sit ?Rolling:  Supervision ?Sidelying to sit: Supervision ?Supine to sit: Supervision, HOB elevated ?General bed mobility comments: pt able to hold and/or lift RUE when entering/exiting bed  ?  ?Transfers ? Overall transfer level: Needs assistance ?Equipment used: None ?Transfers: Sit to/from Stand ?Sit to Stand: Min guard ?General transfer comment: cues for use of both UEs  ?  ?Ambulation / Gait / Stairs / Wheelchair Mobility ? Ambulation/Gait ?Ambulation/Gait assistance: Min  assist ?Gait Distance (Feet): 90 Feet (x3 with rest to regroup from muscle fatigue and incoordination) ?Assistive device: 1 person hand held assist, IV Pole ?Gait Pattern/deviations: Step-through pattern ?General

## 2021-09-12 NOTE — Plan of Care (Signed)
?  Problem: Education: ?Goal: Knowledge of disease or condition will improve ?Outcome: Progressing ?Goal: Knowledge of secondary prevention will improve (SELECT ALL) ?Outcome: Progressing ?Goal: Knowledge of patient specific risk factors will improve (INDIVIDUALIZE FOR PATIENT) ?Outcome: Progressing ?  ?

## 2021-09-12 NOTE — Progress Notes (Signed)
Physical Therapy Treatment ?Patient Details ?Name: Christopher [name] ?MRN: [id number] ?DOB: 1965/06/11 ?Today's Date: 09/12/2021 ? ? ?History of Present Illness 56 y.o. male presents to [facility] hospital on 09/10/2021 with acute R weakenss and slurred speech. CT head negative. Pt received TNK. MRI demonstrates posterior L MCA infarct. PMH includes HTN, HLD, CVA, PUD, hx of cocaine abuse. ? ?  ?PT Comments  ? ? Pt is progressing well toward goals.  Emphasis on obtaining reliable information, following multiple step commands, transition to EOB, scooting, sit to stand technique and safety plus progression of gait stability/coordination. ?  ?Recommendations for follow up therapy are one component of a multi-disciplinary discharge planning process, led by the attending physician.  Recommendations may be updated based on patient status, additional functional criteria and insurance authorization. ? ?Follow Up Recommendations ? Acute inpatient rehab (3hours/day) ?  ?  ?Assistance Recommended at Discharge Frequent or constant Supervision/Assistance  ?Patient can return home with the following A little help with walking and/or transfers;A little help with bathing/dressing/bathroom;Assistance with cooking/housework;Direct supervision/assist for medications management;Direct supervision/assist for financial management;Assist for transportation;Help with stairs or ramp for entrance ?  ?Equipment Recommendations ? Other (comment) (TBA)  ?  ?Recommendations for Other Services Rehab consult ? ? ?  ?Precautions / Restrictions Precautions ?Precautions: Fall ?Precaution Comments: expressive aphasia  ?  ? ?Mobility ? Bed Mobility ?Overal bed mobility: Needs Assistance ?Bed Mobility: Rolling, Sidelying to Sit ?Rolling: Supervision ?Sidelying to sit: Supervision ?  ?  ?  ?General bed mobility comments: transition without assist needs, cued for R UE forgotten and left behind as trunk rolled. ?  ? ?Transfers ?Overall transfer level: Needs assistance ?   ?Transfers: Sit to/from Stand ?Sit to Stand: Min guard ?  ?  ?  ?  ?  ?General transfer comment: cues for use of both UEs ?  ? ?Ambulation/Gait ?Ambulation/Gait assistance: Min assist ?Gait Distance (Feet): 90 Feet ?Assistive device: 1 person hand held assist ?Gait Pattern/deviations: Step-through pattern ?  ?Gait velocity interpretation: <1.8 ft/sec, indicate of risk for recurrent falls ?  ?General Gait Details: mildly paretic, uncoordinated heel /toe patter, more flat contact. Mild drift, 1-2 deviations with looking up/down/scanning. ? ? ?Stairs ?  ?  ?  ?  ?  ? ? ?Wheelchair Mobility ?  ? ?Modified Rankin (Stroke Patients Only) ?Modified Rankin (Stroke Patients Only) ?Pre-Morbid Rankin Score: No symptoms ?Modified Rankin: Moderately severe disability ? ? ?  ?Balance Overall balance assessment: Needs assistance ?Sitting-balance support: No upper extremity supported, Feet supported ?Sitting balance-Leahy Scale: Fair ?  ?  ?Standing balance support: Single extremity supported, Reliant on assistive device for balance, No upper extremity supported ?Standing balance-Leahy Scale: Fair ?Standing balance comment: static fair but tentative, reliant on external support dynamically. ?  ?  ?  ?  ?  ?  ?  ?  ?  ?  ?  ?  ? ?  ?Cognition Arousal/Alertness: Awake/alert ?Behavior During Therapy: Flat affect ?Overall Cognitive Status: Difficult to assess ?  ?  ?  ?  ?  ?  ?  ?  ?  ?  ?  ?  ?  ?  ?  ?  ?General Comments: completed 2/3 commands, forgot the last step. ?  ?  ? ?  ?Exercises   ? ?  ?General Comments General comments (skin integrity, edema, etc.): VSS on RA.  Pt's R LE appears to be progressing well, still mildly uncoordinated, but much more functional than, R UE in general and R hand  specifically along with poor ability to verbally express himself. ?  ?  ? ?Pertinent Vitals/Pain Pain Assessment ?Pain Assessment: No/denies pain  ? ? ?Home Living   ?  ?  ?  ?  ?  ?  ?  ?  ?  ?   ?  ?Prior Function    ?  ?  ?   ? ?PT  Goals (current goals can now be found in the care plan section) Acute Rehab PT Goals ?Patient Stated Goal: to return to independence ?PT Goal Formulation: With patient ?Time For Goal Achievement: 09/25/21 ?Potential to Achieve Goals: Good ?Progress towards PT goals: Progressing toward goals ? ?  ?Frequency ? ? ? Min 4X/week ? ? ? ?  ?PT Plan Current plan remains appropriate  ? ? ?Co-evaluation   ?  ?  ?  ?  ? ?  ?AM-PAC PT "6 Clicks" Mobility   ?Outcome Measure ? Help needed turning from your back to your side while in a flat bed without using bedrails?: A Little ?Help needed moving from lying on your back to sitting on the side of a flat bed without using bedrails?: A Little ?Help needed moving to and from a bed to a chair (including a wheelchair)?: A Little ?Help needed standing up from a chair using your arms (e.g., wheelchair or bedside chair)?: A Little ?Help needed to walk in hospital room?: A Little ?Help needed climbing 3-5 steps with a railing? : Total ?6 Click Score: 16 ? ?  ?End of Session   ?Activity Tolerance: Patient tolerated treatment well ?Patient left: in bed;with call bell/phone within reach;with bed alarm set ?Nurse Communication: Mobility status ?PT Visit Diagnosis: Other abnormalities of gait and mobility (R26.89);Other symptoms and signs involving the nervous system (R29.898) ?  ? ? ?Time: 7672-0947 ?PT Time Calculation (min) (ACUTE ONLY): 27 min ? ?Charges:  $Gait Training: 8-22 mins ?$Therapeutic Activity: 8-22 mins          ?          ? ?09/12/2021 ? ?Christopher [name]., PT ?Acute Rehabilitation Services ?[phone]  (pager) ?[phone]  (office) ? ? ?Christopher [name] ?09/12/2021, 12:00 PM ? ?

## 2021-09-12 NOTE — Progress Notes (Addendum)
Modified Barium Swallow Progress Note ? ?Patient Details  ?Name: Christopher [name] ?MRN: [id number] ?Date of Birth: [birth year] ? ?Today's Date: 09/12/2021 ? ?Modified Barium Swallow completed.  Full report located under Chart Review in the Imaging Section. ? ?Brief recommendations include the following: ? ?Clinical Impression ? Pt presents with oropharyngeal dysphagia characterized by reduced labeal seal, reduced pharyngeal constriction, and a pharyngeal delay. He demonstrated difficulty sucking liquids from a straw, mild right-sided anterior spillage, vallecular residue, and posterior pharyngeal wall residue. Pharyngeal residue was partly facilitated by cervical osteophytes/degenerative changes. Residue was improved with secondary swallows, and eliminated with a liquid wash. He exhibited aspiration of thin liquids (PAS 7) and coughing was ineffective during instances of aspiration with thin liquids. Laryngeal invasion was eliminated or improved to PAS 2 with use of a chin tuck posture. Penetration (PAS 3) and aspiration (PAS 7) were noted once with a large bolus of nectar thick liquids via cup. Aspiration of nectar thick liquids triggered a much more subtle response of throat clearing and slight cough which were both ineffective. Prompts to improve cough strength resulted in pt propelling most of the aspirate from just below the vocal folds to the laryngeal surface of the epiglottis. Mastication was prolonged with regular texture solids and effectiveness is questioned due to dentition and observed bolus movement. A dysphagia 2 diet with thin liquids is recommended with strict observance of swallowing precautions. SLP will follow for dysphagia treatment. ?  ?Swallow Evaluation Recommendations ? ?   ? ? SLP Diet Recommendations: Dysphagia 2 (Fine chop) solids;Thin liquid ? ? Liquid Administration via: Cup;No straw ? ? Medication Administration: Whole meds with puree ? ? Supervision: Full supervision/cueing for  compensatory strategies ? ? Compensations: Slow rate;Small sips/bites;Chin tuck;Follow solids with liquid ? ? Postural Changes: Remain semi-upright after after feeds/meals (Comment);Seated upright at 90 degrees ? ? Oral Care Recommendations: Staff/trained caregiver to provide oral care;Oral care BID ? ?   ? ?Salvator Seppala I. Hardin Negus, [place], CCC-SLP ?Acute Rehabilitation Services ?Office number [phone] ?Pager [phone] ? ? ?[name] ?09/12/2021,11:49 AM ?

## 2021-09-13 NOTE — H&P (Incomplete)
? ? ?Physical Medicine and Rehabilitation Admission H&P ? ?  ?CC: Deficits secondary to left MCA stroke ? ?HPI: Christopher [name] is a right handed 56 year old male who presented to the emergency department on 09/10/2021 complaining of garbled speech and right arm weakness.  Code stroke was activated and Dr. Leonel Ramsay evaluated.  CT was negative for acute stroke and CTA was negative for large vessel occlusion.  Tenecteplase was given at 11:08 AM.  Patient has a history of cocaine use and reported last use was 5 days prior.  He was admitted to the neuro ICU for further management.  Physical exam revealed right facial droop, right arm weakness, right leg weakness, right decreased sensation and dysarthria.  Imaging consistent with left MCA and perirolandic infarct likely secondary to atherosclerosis and cocaine use.  Possible petechial hemorrhage but no malignant hemorrhagic transformation or mass effect.  A right occipital infarct is new since September but appears chronic.  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  Patient is tolerating a dysphagia 2 diet with thin liquids.  2D echocardiogram with ejection fraction of 60 to 65%.  Hemoglobin A1c 6.2.   ? ?The patient's past medical history is significant for hypertension on lisinopril and peptic ulcer disease on PPI.  He was evaluated in September 2022 for possible stroke and MRI revealed acute lacunar infarct within the right thalamic capsular junction.  There was a probable subacute infarct within the subcortical white matter of the right frontal lobe.  Chronic lacunar infarcts noted. He was admitted to inpatient rehab last year. ? ?His urine drug screen was positive for cocaine and tetrahydrocannabinol.  Ongoing tobacco use approximately 1/2 pack/day.  Endorses current alcohol use. ? ?The patient requires inpatient medicine and rehabilitation evaluations and services for ongoing dysfunction secondary to  left MCA stroke. ? ?Able to write legibly on white board with left hand. ?Review of Systems  ?Constitutional:  Negative for chills and fever.  ?HENT:  Negative for hearing loss and sore throat.   ?Eyes:  Negative for blurred vision and double vision.  ?Respiratory:  Negative for cough and sputum production.   ?Cardiovascular:  Negative for chest pain and palpitations.  ?Gastrointestinal:  Negative for constipation, nausea and vomiting.  ?Genitourinary:  Negative for dysuria and urgency.  ?Neurological:  Positive for focal weakness. Negative for dizziness and headaches.  ?Psychiatric/Behavioral:  Negative for depression. The patient does not have insomnia.   ?Past Medical History:  ?Diagnosis Date  ? PUD (peptic ulcer disease) 2008  ? treated with meds  ? ?History reviewed. No pertinent surgical history. ?Family History  ?Problem Relation Age of Onset  ? Cancer Mother   ? Stroke Father   ? ?Social History:  reports that he has been smoking cigarettes. He has been smoking an average of .5 packs per day. He has quit using smokeless tobacco. He reports current alcohol use. He reports current drug use. Drug: "Crack" cocaine. ?Allergies: No Known Allergies ?Medications Prior to Admission  ?Medication Sig Dispense Refill  ? acetaminophen (TYLENOL) 500 MG tablet Take 500 mg by mouth every 6 (six) hours as needed for moderate pain.    ? aspirin 81 MG chewable tablet Chew 1 tablet (81 mg total) by mouth daily. 100 tablet 1  ? atorvastatin (LIPITOR) 20 MG tablet Take 1 tablet (20 mg total) by mouth daily. 90 tablet 1  ? lisinopril (ZESTRIL) 20 MG tablet Take 1 tablet (20 mg total) by mouth daily. 90 tablet  1  ? omeprazole (PRILOSEC) 20 MG capsule Take 1 capsule (20 mg total) by mouth daily. 30 capsule 3  ? acetaminophen (TYLENOL) 325 MG tablet Take 1-2 tablets (325-650 mg total) by mouth every 4 (four) hours as needed for mild pain. (Patient not taking: Reported on 09/10/2021)    ? ? ? ? ?Home: ?Home Living ?Family/patient  expects to be discharged to:: Private residence ?Living Arrangements:  (Lives with sister, [name], since [place] 9/22) ?Available Help at Discharge: Available 24 hours/day, Family ?Type of Home: House ?Home Access: Stairs to enter ?Entrance Stairs-Number of Steps: 3 ?Entrance Stairs-Rails: Left ?Home Layout: One level ?Bathroom Shower/Tub: Walk-in shower ?Bathroom Toilet: Standard ?Bathroom Accessibility: Yes ?Home Equipment: Kasandra Knudsen - single point ?Additional Comments: need to confirm history. pt answering with yes/no head nods and may be unreliable due to aphasia ? Lives With: Family (sister, [name]) ?  ?Functional History: ?Prior Function ?Prior Level of Function : Independent/Modified Independent ?Mobility Comments: pt indicates he was ambulating with a SPC ?ADLs Comments: Pt reports his sister assists with cooking and cleaning ? ?Functional Status:  ?Mobility: ?Bed Mobility ?Overal bed mobility: Needs Assistance ?Bed Mobility: Rolling, Sidelying to Sit ?Rolling: Supervision ?Sidelying to sit: Supervision ?Supine to sit: Supervision, HOB elevated ?General bed mobility comments: transition without assist needs, cued for R UE forgotten and left behind as trunk rolled. ?Transfers ?Overall transfer level: Needs assistance ?Equipment used:  (railing) ?Transfers: Sit to/from Stand ?Sit to Stand: Min guard ?General transfer comment: cues for use of both UEs ?Ambulation/Gait ?Ambulation/Gait assistance: Min assist ?Gait Distance (Feet): 90 Feet ?Assistive device: 1 person hand held assist ?Gait Pattern/deviations: Step-through pattern ?General Gait Details: mildly paretic, uncoordinated heel /toe patter, more flat contact. Mild drift, 1-2 deviations with looking up/down/scanning. ?Gait velocity: reduced ?Gait velocity interpretation: <1.8 ft/sec, indicate of risk for recurrent falls ?  ? ?ADL: ?ADL ?Overall ADL's : Needs assistance/impaired ?Eating/Feeding: Set up, Sitting ?Eating/Feeding Details (indicate cue type and reason):  eating upon arrival, food soiled face, beard and gown ?Grooming: Minimal assistance, Sitting ?Grooming Details (indicate cue type and reason): min A for thoroughness ?Upper Body Bathing: Minimal assistance, Sitting ?Lower Body Bathing: Moderate assistance, Sit to/from stand ?Upper Body Dressing : Minimal assistance, Sitting, Cueing for compensatory techniques ?Upper Body Dressing Details (indicate cue type and reason): cues to place RUE first ?Lower Body Dressing: Maximal assistance, Sit to/from stand ?Toilet Transfer: Minimal assistance, Stand-pivot ?Toileting- Clothing Manipulation and Hygiene: Minimal assistance, Sit to/from stand ?Functional mobility during ADLs: Minimal assistance ?General ADL Comments: limited by expressive communication difficulties, R weakness and limited ROM ? ?Cognition: ?Cognition ?Overall Cognitive Status: Difficult to assess ?Arousal/Alertness: Awake/alert ?Orientation Level: Oriented X4 ?Attention: Sustained ?Sustained Attention: Appears intact ?Memory: Appears intact ?Cognition ?Arousal/Alertness: Awake/alert ?Behavior During Therapy: Flat affect ?Overall Cognitive Status: Difficult to assess ?General Comments: completed 2/3 commands, forgot the last step. ?Difficult to assess due to: Impaired communication ? ?Physical Exam: ?Blood pressure 131/76, pulse 93, temperature 98.5 ?F (36.9 ?C), temperature source Oral, resp. rate 18, SpO2 94 %. ?Physical Exam ?Constitutional:   ?   General: He is not in acute distress. ?   Appearance: He is not ill-appearing.  ?HENT:  ?   Mouth/Throat:  ?   Pharynx: Oropharyngeal exudate present.  ?Cardiovascular:  ?   Rate and Rhythm: Normal rate and regular rhythm.  ?Pulmonary:  ?   Effort: Pulmonary effort is normal.  ?Abdominal:  ?   General: Abdomen is flat.  ?   Palpations: Abdomen is soft.  ?Musculoskeletal:  ?  Cervical back: Normal range of motion and neck supple.  ?Skin: ?   General: Skin is warm and dry.  ?Neurological:  ?   Mental Status: He is  alert and oriented to person, place, and time.  ?   Comments: Has right wrist and hand weakness  ?Psychiatric:     ?   Mood and Affect: Mood normal.     ?   Behavior: Behavior normal.  ? ? ? ?No results

## 2021-09-13 NOTE — Progress Notes (Signed)
Inpatient Rehabilitation Admissions Coordinator  ? ?I await CIR bed availability for possible Cir admit. No bed for him today. ? ?Ottie Glazier, RN, MSN ?Rehab Admissions Coordinator ?[phone] ?09/13/2021 11:20 AM ? ?

## 2021-09-13 NOTE — Progress Notes (Signed)
Physical Therapy Treatment ?Patient Details ?Name: Christopher [name] ?MRN: [id number] ?DOB: [birth year] ?Today's Date: 09/13/2021 ? ? ?History of Present Illness 56 y.o. male presents to [facility] hospital on 09/10/2021 with acute R weakenss and slurred speech. CT head negative. Pt received TNK. MRI demonstrates posterior L MCA infarct. PMH includes HTN, HLD, CVA, PUD, hx of cocaine abuse. ? ?  ?PT Comments  ? ? Pt eager to participate, steadily improving toward goals.  Emphasized transitions, sit to stands and progression of gait stability/stamina and gait quality.  Pt's primary deficits are his speech and R hand function. ?  ?Recommendations for follow up therapy are one component of a multi-disciplinary discharge planning process, led by the attending physician.  Recommendations may be updated based on patient status, additional functional criteria and insurance authorization. ? ?Follow Up Recommendations ? Acute inpatient rehab (3hours/day) ?  ?  ?Assistance Recommended at Discharge Frequent or constant Supervision/Assistance  ?Patient can return home with the following A little help with walking and/or transfers;A little help with bathing/dressing/bathroom;Assistance with cooking/housework;Direct supervision/assist for medications management;Direct supervision/assist for financial management;Assist for transportation;Help with stairs or ramp for entrance ?  ?Equipment Recommendations ? Other (comment)  ?  ?Recommendations for Other Services Rehab consult ? ? ?  ?Precautions / Restrictions Precautions ?Precautions: Fall ?Precaution Comments: expressive aphasia ?Restrictions ?Weight Bearing Restrictions: No  ?  ? ?Mobility ? Bed Mobility ?Overal bed mobility: Needs Assistance ?Bed Mobility: Rolling, Sidelying to Sit ?Rolling: Supervision ?Sidelying to sit: Supervision ?Supine to sit: Supervision, HOB elevated ?  ?  ?General bed mobility comments: pt able to hold and/or lift RUE when entering/exiting bed ?  ? ?Transfers ?Overall  transfer level: Needs assistance ?Equipment used: None ?Transfers: Sit to/from Stand ?Sit to Stand: Min guard ?  ?  ?  ?  ?  ?  ?  ? ?Ambulation/Gait ?Ambulation/Gait assistance: Min assist ?Gait Distance (Feet): 90 Feet (x3 with rest to regroup from muscle fatigue and incoordination) ?Assistive device: 1 person hand held assist, IV Pole ?Gait Pattern/deviations: Step-through pattern ?Gait velocity: reduced ?Gait velocity interpretation: <1.8 ft/sec, indicate of risk for recurrent falls ?  ?General Gait Details: continued mildly paretic, mildly weak heel /toe pattern, not as flat footedt. Mild drift, No overt deviations/staggers with scanning ? ? ?Stairs ?  ?  ?  ?  ?  ? ? ?Wheelchair Mobility ?  ? ?Modified Rankin (Stroke Patients Only) ?Modified Rankin (Stroke Patients Only) ?Pre-Morbid Rankin Score: No symptoms ?Modified Rankin: Moderately severe disability ? ? ?  ?Balance Overall balance assessment: Needs assistance ?Sitting-balance support: No upper extremity supported, Feet supported ?Sitting balance-Leahy Scale: Fair ?  ?  ?Standing balance support: Single extremity supported, Reliant on assistive device for balance, No upper extremity supported ?Standing balance-Leahy Scale: Fair ?  ?  ?  ?  ?  ?  ?  ?  ?  ?  ?  ?  ?  ? ?  ?Cognition Arousal/Alertness: Awake/alert ?Behavior During Therapy: Flat affect ?Overall Cognitive Status: Difficult to assess ?  ?  ?  ?  ?  ?  ?  ?  ?  ?  ?  ?  ?  ?  ?  ?  ?General Comments: follows multistep commands with increased cuing, single step commands consistently ?  ?  ? ?  ?Exercises   ? ?  ?General Comments   ?  ?  ? ?Pertinent Vitals/Pain Pain Assessment ?Pain Assessment: No/denies pain  ? ? ?Home Living   ?  ?  ?  ?  ?  ?  ?  ?  ?  ?   ?  ?  Prior Function    ?  ?  ?   ? ?PT Goals (current goals can now be found in the care plan section) Acute Rehab PT Goals ?PT Goal Formulation: With patient ?Time For Goal Achievement: 09/25/21 ?Potential to Achieve Goals: Good ?Progress  towards PT goals: Progressing toward goals ? ?  ?Frequency ? ? ? Min 4X/week ? ? ? ?  ?PT Plan Current plan remains appropriate  ? ? ?Co-evaluation   ?  ?  ?  ?  ? ?  ?AM-PAC PT "6 Clicks" Mobility   ?Outcome Measure ? Help needed turning from your back to your side while in a flat bed without using bedrails?: A Little ?Help needed moving from lying on your back to sitting on the side of a flat bed without using bedrails?: A Little ?Help needed moving to and from a bed to a chair (including a wheelchair)?: A Little ?Help needed standing up from a chair using your arms (e.g., wheelchair or bedside chair)?: A Little ?Help needed to walk in hospital room?: A Little ?Help needed climbing 3-5 steps with a railing? : A Lot ?6 Click Score: 17 ? ?  ?End of Session   ?Activity Tolerance: Patient tolerated treatment well ?Patient left: in bed;with call bell/phone within reach;with bed alarm set ?Nurse Communication: Mobility status ?PT Visit Diagnosis: Other abnormalities of gait and mobility (R26.89);Other symptoms and signs involving the nervous system (R29.898) ?  ? ? ?Time: [id number] ?PT Time Calculation (min) (ACUTE ONLY): 14 min ? ?Charges:  $Gait Training: 8-22 mins          ?          ? ?09/13/2021 ? ?Ginger Carne., PT ?Acute Rehabilitation Services ?[phone]  (pager) ?[phone]  (office) ? ? ?Tessie Fass Amen Dargis ?09/13/2021, 5:35 PM ? ?

## 2021-09-13 NOTE — Progress Notes (Signed)
STROKE TEAM PROGRESS NOTE  ? ?INTERVAL HISTORY ?No family at the bedside.  Patient is lying in bed comfortably neurological exam is unchanged with mild right upper extremity weakness, right leg weakness with subtle drift.  Vital signs stable.  Therapist recommend inpatient rehab but no bed available today.  Neurological exam is unchanged ? ?Vitals:  ? 09/13/21 0530 09/13/21 0819 09/13/21 0935 09/13/21 1105  ?BP: 118/73 131/76 113/70 (!) 144/69  ?Pulse: 73 93 86 83  ?Resp: (!) 22 18 20 18   ?Temp: 98.3 ?F (36.8 ?C) 98.5 ?F (36.9 ?C)  98.3 ?F (36.8 ?C)  ?TempSrc: Oral Oral  Oral  ?SpO2: 94% 94%  92%  ? ?CBC:  ?Recent Labs  ?Lab 09/10/21 ?1046  ?WBC 11.8*  ?NEUTROABS 8.8*  ?HGB 13.9  ?HCT 40.1  ?MCV 90.1  ?PLT 321  ? ?Basic Metabolic Panel:  ?Recent Labs  ?Lab 09/07/21 ?1406 09/10/21 ?1046  ?NA 137 136  ?K 5.1 4.4  ?CL 99 104  ?CO2 22 24  ?GLUCOSE 95 115*  ?BUN 24 25*  ?CREATININE 1.56* 1.85*  ?CALCIUM 9.5 9.3  ? ?Lipid Panel:  ?Recent Labs  ?Lab 09/11/21 ?0234  ?CHOL 113  ?TRIG 94  ?HDL 26*  ?CHOLHDL 4.3  ?VLDL 19  ?LDLCALC 68  ? ?HgbA1c:  ?Recent Labs  ?Lab 09/10/21 ?1112  ?HGBA1C 6.2*  ? ?Urine Drug Screen: No results for input(s): LABOPIA, COCAINSCRNUR, LABBENZ, AMPHETMU, THCU, LABBARB in the last 168 hours.  ?Alcohol Level  ?Recent Labs  ?Lab 09/10/21 ?1113  ?ETH <10  ? ? ?IMAGING past 24 hours ?No results found. ? ?PHYSICAL EXAM ?Physical Exam  ?Constitutional: Frail malnourished looking middle-aged Caucasian male.  Not in distress.   ?Psych: Affect appropriate to situation ?Cardiovascular: Normal rate and regular rhythm.  ?Respiratory: Effort normal and breath sounds normal to anterior ascultation ?  ?Neuro: ?Mental Status: ?Patient is awake, alert, oriented to person, place, month, year, and situation. Speech is very dysarthric.  ?Patient is able to give a clear and coherent history. ?No signs of aphasia or neglect ?Cranial Nerves: ?II: Visual Fields are full. Pupils are equal, round, and reactive to light.    ?III,IV, VI: EOMI without ptosis or diploplia.  ?V: Facial sensation is symmetric to temperature ?VII: right facial weakness ?VIII: hearing is intact to voice ?X: Uvula elevates symmetrically ?XI: Shoulder shrug is symmetric. ?XII: tongue is midline without atrophy or fasciculations.  ?Motor: ?Tone is normal. Bulk is normal. Right arm 4/5, Right leg  5/5. 5/5 strength in left upper and lower extremity.  Significant weakness of right wrist grip and hand muscles. ?Sensory: ? Decreased on right side  ?Cerebellar: ?FNF and HKS are intact bilaterally ?  ? ?ASSESSMENT/PLAN ?Mr. VERNIS EID is a 56 y.o. male with history of HTN, HLD, CVA, PUD, hx of cocaine abuse and current tobacco smoker who presents to the [facility] ED for acute onset of right side weakness and slurred speech.  ? 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 No acute cortically based infarct or acute intracranial hemorrhage identified ?CTA head & neck Positive for new short segment occlusion of the Right Vertebral Artery V1 segment which has progressed since September. Reconstituted and diminutive but patent Right Vertebral. 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 ?MRI - Acute posterior Left MCA, perirolandic  infarct with possible petechial hemorrhage, but no malignant hemorrhagic transformation or mass effect. ?2D Echo EF 60-65%  ?LDL 68 ?HgbA1c 6.2 ?VTE prophylaxis - Lovenox 40 mg ?   ?Diet  ? DIET DYS 2 Room service appropriate? Yes with Assist; Fluid consistency: Thin  ? ?No antithrombotic prior to admission, now on ASA 325mg  ?Therapy recommendations:  CIR ?Disposition:  Pending ? ?Hypertension ?Home meds: Lisinopril ?Stable ?Permissive hypertension (OK if < 220/120) but gradually normalize in 5-7  days ?Long-term BP goal normotensive ? ?Hyperlipidemia ?Home meds:  Atorvastatin 20mg , resumed in hospital ?LDL 68, goal < 70 ?Continue statin at discharge ? ?Other Stroke Risk Factors ?Cigarette smoker, advised to stop smoking ?ETOH use, alcohol level <10, advised to drink no more than 2 drink(s) a day ?Substance abuse - UDS:  THC POSITIVE, Cocaine POSITIVE. Patient advised to stop using due to stroke risk. ?Hx stroke/TIA ? ?Other Active Problems ? ?Continue mobilization out of bed and ongoing therapies.  Transfer to inpatient rehab when bed available.  Continue aspirin for stroke prevention and aggressive risk factor modification.  Transfer to inpatient rehab when bed available greater than 50% time during this 25-minute visit was spent in counseling and coordination of care and discussion with patient and care team and answering questions. ? , MD ? ?To contact Stroke Continuity provider, please refer to . ?After hours, contact General Neurology ? ?

## 2021-09-13 NOTE — TOC CAGE-AID Note (Signed)
Transition of Care (TOC) - CAGE-AID Screening ? ? ?Patient Details  ?Name: Christopher [name] ?MRN: [id number] ?Date of Birth: 1965-11-23 ? ?Transition of Care (TOC) CM/SW Contact:    ?Percell Lamboy C Tarpley-Carter, LCSWA ?Phone Number: ?09/13/2021, 2:38 PM ? ? ?Clinical Narrative: ?Pt participated in Cage-Aid.  Pt stated he does use substance and ETOH.  Pt was offered resources, due to usage of substance and ETOH.    ? ?Insurance underwriter, MSW, LCSW-A ?Pronouns:  She/Her/Hers ?[facility] ?Clinical Social Worker ?Direct Number:  [phone] ?[email or web address] ? ?CAGE-AID Screening: ?  ? ?Have You Ever Felt You Ought to Cut Down on Your Drinking or Drug Use?: Yes ?Have People Annoyed You By Critizing Your Drinking Or Drug Use?: No ?Have You Felt Bad Or Guilty About Your Drinking Or Drug Use?: No ?Have You Ever Had a Drink or Used Drugs First Thing In The Morning to Steady Your Nerves or to Get Rid of a Hangover?: No ?CAGE-AID Score: 1 ? ?Substance Abuse Education Offered: Yes ? ?Substance abuse interventions: Educational Materials ? ? ? ? ? ? ?

## 2021-09-13 NOTE — Progress Notes (Signed)
Occupational Therapy Treatment ?Patient Details ?Name: Christopher [name] ?MRN: [id number] ?DOB: [birth year] ?Today's Date: 09/13/2021 ? ? ?History of present illness 56 y.o. male presents to [facility] hospital on 09/10/2021 with acute R weakenss and slurred speech. CT head negative. Pt received TNK. MRI demonstrates posterior L MCA infarct. PMH includes HTN, HLD, CVA, PUD, hx of cocaine abuse. ?  ?OT comments ? Pt progressing towards goals, able to complete in room ambulation this session with min guard A, mild unsteadiness. Pt completing standing sinkside cleaning task to facilitate RUE WB and mobility. Pt mod A for LB dressing to don/doff sock. Pt educated on holding RUE with movement, and keeping it positioned on pillows when in bed for support, pt verbalized understanding. Pt presenting with impairments listed below, will follow acutely. Continue to recommend AIR at d/c.  ? ?Recommendations for follow up therapy are one component of a multi-disciplinary discharge planning process, led by the attending physician.  Recommendations may be updated based on patient status, additional functional criteria and insurance authorization. ?   ?Follow Up Recommendations ? Acute inpatient rehab (3hours/day)  ?  ?Assistance Recommended at Discharge Frequent or constant Supervision/Assistance  ?Patient can return home with the following ? A little help with walking and/or transfers;A lot of help with bathing/dressing/bathroom;Assistance with cooking/housework;Assistance with feeding;Direct supervision/assist for medications management;Assist for transportation;Help with stairs or ramp for entrance ?  ?Equipment Recommendations ? None recommended by OT;Other (comment) (defer to next venue of care)  ?  ?Recommendations for Other Services Rehab consult ? ?  ?Precautions / Restrictions Precautions ?Precautions: Fall ?Precaution Comments: expressive aphasia ?Restrictions ?Weight Bearing Restrictions: No  ? ? ?  ? ?Mobility Bed Mobility ?Overal bed  mobility: Needs Assistance ?Bed Mobility: Rolling, Sidelying to Sit ?Rolling: Supervision ?Sidelying to sit: Supervision ?Supine to sit: Supervision, HOB elevated ?  ?  ?General bed mobility comments: pt able to hold and/or lift RUE when entering/exiting bed ?  ? ?Transfers ?Overall transfer level: Needs assistance ?Equipment used: None ?Transfers: Sit to/from Stand ?Sit to Stand: Min guard ?  ?  ?  ?  ?  ?  ?  ?  ?Balance Overall balance assessment: Needs assistance ?Sitting-balance support: No upper extremity supported, Feet supported ?Sitting balance-Leahy Scale: Fair ?  ?  ?Standing balance support: Single extremity supported, Reliant on assistive device for balance, No upper extremity supported ?Standing balance-Leahy Scale: Fair ?Standing balance comment: mild unsteadiness with in room ambulation, holds tele box ?  ?  ?  ?  ?  ?  ?  ?  ?  ?  ?  ?   ? ?ADL either performed or assessed with clinical judgement  ? ?ADL Overall ADL's : Needs assistance/impaired ?  ?  ?  ?  ?  ?  ?  ?  ?  ?  ?Lower Body Dressing: Moderate assistance;Sitting/lateral leans ?Lower Body Dressing Details (indicate cue type and reason): to don sock ?Toilet Transfer: Min guard;Ambulation;Regular Toilet ?Toilet Transfer Details (indicate cue type and reason): simulated in room ?  ?  ?  ?  ?Functional mobility during ADLs: Min guard ?  ?  ? ?Extremity/Trunk Assessment Upper Extremity Assessment ?Upper Extremity Assessment: LUE deficits/detail ?RUE Deficits / Details: weaker distally, able to use extremity as a funcitonal assist. full PROM. poor coordination ?RUE Coordination: decreased fine motor;decreased gross motor ?LUE Deficits / Details: WFL but generally weak - pt is LHD ?  ?Lower Extremity Assessment ?Lower Extremity Assessment: Defer to PT evaluation ?  ?  ?  ? ?  Vision   ?Vision Assessment?: No apparent visual deficits ?  ?Perception Perception ?Perception: Not tested ?  ?Praxis Praxis ?Praxis: Not tested ?  ? ?Cognition  Arousal/Alertness: Awake/alert ?Behavior During Therapy: Flat affect ?Overall Cognitive Status: Difficult to assess ?  ?  ?  ?  ?  ?  ?  ?  ?  ?  ?  ?  ?  ?  ?  ?  ?General Comments: follows multistep commands with increased cuing, single step commands consistently ?  ?  ?   ?Exercises Exercises: Other exercises ?Other Exercises ?Other Exercises: weight shifting to RUE x10 ?Other Exercises: forward reaching with use of washcloth on counter x10 ? ?  ?Shoulder Instructions   ? ? ?  ?General Comments VSS On RA  ? ? ?Pertinent Vitals/ Pain       Pain Assessment ?Pain Assessment: No/denies pain ? ?Home Living   ?  ?  ?  ?  ?  ?  ?  ?  ?  ?  ?  ?  ?  ?  ?  ?  ?  ?  ? ?  ?Prior Functioning/Environment    ?  ?  ?  ?   ? ?Frequency ? Min 2X/week  ? ? ? ? ?  ?Progress Toward Goals ? ?OT Goals(current goals can now be found in the care plan section) ? Progress towards OT goals: Progressing toward goals ? ?Acute Rehab OT Goals ?Patient Stated Goal: none stated ?OT Goal Formulation: With patient ?Time For Goal Achievement: 09/25/21 ?Potential to Achieve Goals: Good ?ADL Goals ?Pt Will Perform Grooming: with supervision;standing ?Pt Will Perform Lower Body Dressing: with set-up;sit to/from stand ?Pt Will Transfer to Toilet: with supervision;ambulating ?Additional ADL Goal #1: pt will use RUE as a funcitonal assist to complete grooming tasks with supervision A  ?Plan Discharge plan remains appropriate;Frequency remains appropriate   ? ?Co-evaluation ? ? ?   ?  ?  ?  ?  ? ?  ?AM-PAC OT "6 Clicks" Daily Activity     ?Outcome Measure ? ? Help from another person eating meals?: A Little ?Help from another person taking care of personal grooming?: A Little ?Help from another person toileting, which includes using toliet, bedpan, or urinal?: A Little ?Help from another person bathing (including washing, rinsing, drying)?: A Lot ?Help from another person to put on and taking off regular upper body clothing?: A Little ?Help from another  person to put on and taking off regular lower body clothing?: A Lot ?6 Click Score: 16 ? ?  ?End of Session   ? ?OT Visit Diagnosis: Unsteadiness on feet (R26.81);Other abnormalities of gait and mobility (R26.89);Muscle weakness (generalized) (M62.81);Hemiplegia and hemiparesis ?Hemiplegia - Right/Left: Right ?Hemiplegia - dominant/non-dominant: Non-Dominant ?Hemiplegia - caused by: Cerebral infarction ?  ?Activity Tolerance Patient tolerated treatment well ?  ?Patient Left in bed;with call bell/phone within reach;with bed alarm set ?  ?Nurse Communication Mobility status ?  ? ?   ? ?Time: 8338-2505 ?OT Time Calculation (min): 21 min ? ?Charges: OT General Charges ?$OT Visit: 1 Visit ?OT Treatments ?$Therapeutic Activity: 8-22 mins ? ?Alfonzo Beers, OTD, OTR/L ?Acute Rehab ?[phone] ? ? ?Mayer Masker ?09/13/2021, 12:11 PM ?

## 2021-09-14 ENCOUNTER — Other Ambulatory Visit: Payer: Self-pay

## 2021-09-14 ENCOUNTER — Encounter (HOSPITAL_COMMUNITY): Payer: Self-pay | Admitting: Physical Medicine & Rehabilitation

## 2021-09-14 ENCOUNTER — Inpatient Hospital Stay (HOSPITAL_COMMUNITY)
Admission: RE | Admit: 2021-09-14 | Discharge: 2021-09-21 | DRG: 057 | Disposition: A | Discharge status: Home Health | Payer: Medicaid Other | Source: Intra-hospital | Attending: Physical Medicine & Rehabilitation | Admitting: Physical Medicine & Rehabilitation

## 2021-09-14 DIAGNOSIS — I672 Cerebral atherosclerosis: Secondary | ICD-10-CM | POA: Diagnosis present

## 2021-09-14 DIAGNOSIS — I10 Essential (primary) hypertension: Secondary | ICD-10-CM | POA: Diagnosis present

## 2021-09-14 DIAGNOSIS — B370 Candidal stomatitis: Secondary | ICD-10-CM | POA: Diagnosis present

## 2021-09-14 DIAGNOSIS — I69391 Dysphagia following cerebral infarction: Secondary | ICD-10-CM

## 2021-09-14 DIAGNOSIS — K219 Gastro-esophageal reflux disease without esophagitis: Secondary | ICD-10-CM

## 2021-09-14 DIAGNOSIS — I63512 Cerebral infarction due to unspecified occlusion or stenosis of left middle cerebral artery: Secondary | ICD-10-CM | POA: Diagnosis not present

## 2021-09-14 DIAGNOSIS — Z823 Family history of stroke: Secondary | ICD-10-CM

## 2021-09-14 DIAGNOSIS — D72829 Elevated white blood cell count, unspecified: Secondary | ICD-10-CM | POA: Diagnosis not present

## 2021-09-14 DIAGNOSIS — R29810 Facial weakness: Secondary | ICD-10-CM | POA: Diagnosis present

## 2021-09-14 DIAGNOSIS — N179 Acute kidney failure, unspecified: Secondary | ICD-10-CM | POA: Diagnosis present

## 2021-09-14 DIAGNOSIS — Z79899 Other long term (current) drug therapy: Secondary | ICD-10-CM

## 2021-09-14 DIAGNOSIS — I69320 Aphasia following cerebral infarction: Secondary | ICD-10-CM

## 2021-09-14 DIAGNOSIS — Z7982 Long term (current) use of aspirin: Secondary | ICD-10-CM | POA: Diagnosis not present

## 2021-09-14 DIAGNOSIS — R1310 Dysphagia, unspecified: Secondary | ICD-10-CM | POA: Diagnosis present

## 2021-09-14 DIAGNOSIS — R1312 Dysphagia, oropharyngeal phase: Secondary | ICD-10-CM

## 2021-09-14 DIAGNOSIS — F1910 Other psychoactive substance abuse, uncomplicated: Secondary | ICD-10-CM | POA: Diagnosis not present

## 2021-09-14 DIAGNOSIS — Z809 Family history of malignant neoplasm, unspecified: Secondary | ICD-10-CM

## 2021-09-14 DIAGNOSIS — R7303 Prediabetes: Secondary | ICD-10-CM | POA: Diagnosis present

## 2021-09-14 DIAGNOSIS — I69351 Hemiplegia and hemiparesis following cerebral infarction affecting right dominant side: Principal | ICD-10-CM

## 2021-09-14 DIAGNOSIS — F17210 Nicotine dependence, cigarettes, uncomplicated: Secondary | ICD-10-CM | POA: Diagnosis present

## 2021-09-14 DIAGNOSIS — I69322 Dysarthria following cerebral infarction: Secondary | ICD-10-CM

## 2021-09-14 DIAGNOSIS — K279 Peptic ulcer, site unspecified, unspecified as acute or chronic, without hemorrhage or perforation: Secondary | ICD-10-CM | POA: Diagnosis present

## 2021-09-14 DIAGNOSIS — E785 Hyperlipidemia, unspecified: Secondary | ICD-10-CM | POA: Diagnosis present

## 2021-09-14 DIAGNOSIS — M7592 Shoulder lesion, unspecified, left shoulder: Secondary | ICD-10-CM | POA: Diagnosis present

## 2021-09-14 MED ORDER — PROCHLORPERAZINE EDISYLATE 10 MG/2ML IJ SOLN: 5.0000 mg | Freq: Four times a day (QID) | INTRAMUSCULAR | Status: DC | PRN

## 2021-09-14 MED ORDER — FOOD THICKENER (SIMPLYTHICK): 10.0000 | ORAL | 0 refills | Status: DC | PRN

## 2021-09-14 MED ORDER — FLEET ENEMA 7-19 GM/118ML RE ENEM: 1.0000 | ENEMA | Freq: Once | RECTAL | Status: DC | PRN

## 2021-09-14 MED ORDER — METHOCARBAMOL 500 MG PO TABS
500.0000 mg | ORAL_TABLET | Freq: Four times a day (QID) | ORAL | Status: DC | PRN
Start: 2021-09-14 — End: 2021-09-21

## 2021-09-14 MED ORDER — ACETAMINOPHEN 325 MG PO TABS: 325.0000 mg | ORAL_TABLET | ORAL | Status: DC | PRN

## 2021-09-14 MED ORDER — TRAZODONE HCL 50 MG PO TABS: 25.0000 mg | ORAL_TABLET | Freq: Every evening | ORAL | Status: DC | PRN

## 2021-09-14 MED ORDER — ENOXAPARIN SODIUM 40 MG/0.4ML IJ SOSY
40.0000 mg | PREFILLED_SYRINGE | INTRAMUSCULAR | Status: DC
  Administered 2021-09-14 – 2021-09-20 (×7): 40 mg via SUBCUTANEOUS
  Filled 2021-09-14 (×7): qty 0.4

## 2021-09-14 MED ORDER — GUAIFENESIN-DM 100-10 MG/5ML PO SYRP: 5.0000 mL | ORAL_SOLUTION | Freq: Four times a day (QID) | ORAL | Status: DC | PRN

## 2021-09-14 MED ORDER — ASPIRIN 325 MG PO TABS
325.0000 mg | ORAL_TABLET | Freq: Every day | ORAL | Status: DC
Start: 2021-09-15 — End: 2021-09-21
  Administered 2021-09-15 – 2021-09-21 (×7): 325 mg via ORAL
  Filled 2021-09-14 (×7): qty 1

## 2021-09-14 MED ORDER — PANTOPRAZOLE SODIUM 40 MG PO TBEC
40.0000 mg | DELAYED_RELEASE_TABLET | Freq: Every day | ORAL | Status: DC
  Administered 2021-09-15 – 2021-09-21 (×7): 40 mg via ORAL
  Filled 2021-09-14 (×7): qty 1

## 2021-09-14 MED ORDER — PROCHLORPERAZINE MALEATE 5 MG PO TABS: 5.0000 mg | ORAL_TABLET | Freq: Four times a day (QID) | ORAL | Status: DC | PRN

## 2021-09-14 MED ORDER — PROCHLORPERAZINE 25 MG RE SUPP: 12.5000 mg | Freq: Four times a day (QID) | RECTAL | Status: DC | PRN

## 2021-09-14 MED ORDER — SORBITOL 70 % SOLN: 30.0000 mL | Freq: Every day | Status: DC | PRN

## 2021-09-14 MED ORDER — DIPHENHYDRAMINE HCL 12.5 MG/5ML PO ELIX: 12.5000 mg | ORAL_SOLUTION | Freq: Four times a day (QID) | ORAL | Status: DC | PRN

## 2021-09-14 MED ORDER — ATORVASTATIN CALCIUM 10 MG PO TABS
20.0000 mg | ORAL_TABLET | Freq: Every day | ORAL | Status: DC
  Administered 2021-09-14 – 2021-09-21 (×8): 20 mg via ORAL
  Filled 2021-09-14 (×8): qty 2

## 2021-09-14 MED ORDER — SENNOSIDES-DOCUSATE SODIUM 8.6-50 MG PO TABS: 1.0000 | ORAL_TABLET | Freq: Every evening | ORAL | Status: DC | PRN

## 2021-09-14 MED ORDER — NYSTATIN 100000 UNIT/ML MT SUSP
5.0000 mL | Freq: Four times a day (QID) | OROMUCOSAL | Status: DC
  Administered 2021-09-14 – 2021-09-21 (×25): 500000 [IU] via ORAL
  Filled 2021-09-14 (×26): qty 5

## 2021-09-14 MED ORDER — ALUM & MAG HYDROXIDE-SIMETH 200-200-20 MG/5ML PO SUSP: 30.0000 mL | ORAL | Status: DC | PRN

## 2021-09-14 MED ORDER — ASPIRIN 325 MG PO TABS: 325.0000 mg | ORAL_TABLET | Freq: Every day | ORAL | 0 refills | Status: DC

## 2021-09-14 NOTE — Discharge Summary (Addendum)
Stroke Discharge Summary  ?Patient ID: Christopher [name]    l ?  MRN: [id number]    ?  DOB: 1966-01-10 ? ?Date of Admission: 09/10/2021 ?Date of Discharge: 09/14/2021 ? ?Attending Physician:  Delia Heady MD ?Consultant(s):   rehabilitation medicine ?Patient's PCP:  Georganna Skeans, MD ? 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  Stroke [facility]) ?Active Problems: ?  Pressure injury of skin ?Cocaine abuse ?Intracranial atherosclerosis ? ? ?Medications to be continued on Rehab ?Allergies as of 09/14/2021   ?No Known Allergies ?  ? ?  ?Medication List  ?  ? ?STOP taking these medications   ? ?aspirin 81 MG chewable tablet ?Replaced by: aspirin 325 MG tablet ?  ? ?  ? ?TAKE these medications   ? ?acetaminophen 500 MG tablet ?Commonly known as: TYLENOL ?Take 500 mg by mouth every 6 (six) hours as needed for moderate pain. ?  ?acetaminophen 325 MG tablet ?Commonly known as: TYLENOL ?Take 1-2 tablets (325-650 mg total) by mouth every 4 (four) hours as needed for mild pain. ?  ?aspirin 325 MG tablet ?Take 1 tablet (325 mg total) by mouth daily. ?Start taking on: Sep 15, 2021 ?Replaces: aspirin 81 MG chewable tablet ?  ?atorvastatin 20 MG tablet ?Commonly known as: LIPITOR ?Take 1 tablet (20 mg total) by mouth daily. ?  ?food thickener Gel ?Commonly known as: SIMPLYTHICK (NECTAR/LEVEL 2/MILDLY THICK) ?Take 10 packets by mouth as needed. ?  ?lisinopril 20 MG tablet ?Commonly known as: ZESTRIL ?Take 1 tablet (20 mg total) by mouth daily. ?  ?omeprazole 20 MG capsule ?Commonly known as: PRILOSEC ?Take 1 capsule (20 mg total) by mouth daily. ?  ? ?  ? ? ?LABORATORY STUDIES ?CBC ?   ?Component Value Date/Time  ? WBC 11.8 (H) 09/10/2021 1046  ? RBC 4.45 09/10/2021 1046  ? HGB 13.9 09/10/2021 1046  ? HGB 14.1 08/10/2021 1538  ? HCT 40.1 09/10/2021 1046  ? HCT 41.7 08/10/2021 1538  ? PLT 321 09/10/2021 1046  ? PLT 362 08/10/2021 1538  ? MCV  90.1 09/10/2021 1046  ? MCV 89 08/10/2021 1538  ? MCH 31.2 09/10/2021 1046  ? MCHC 34.7 09/10/2021 1046  ? RDW 11.9 09/10/2021 1046  ? RDW 11.9 08/10/2021 1538  ? LYMPHSABS 1.8 09/10/2021 1046  ? LYMPHSABS 2.1 08/10/2021 1538  ? MONOABS 0.9 09/10/2021 1046  ? EOSABS 0.2 09/10/2021 1046  ? EOSABS 0.1 08/10/2021 1538  ? BASOSABS 0.1 09/10/2021 1046  ? BASOSABS 0.1 08/10/2021 1538  ? ?CMP ?   ?Component Value Date/Time  ? NA 136 09/10/2021 1046  ? NA 137 09/07/2021 1406  ? K 4.4 09/10/2021 1046  ? CL 104 09/10/2021 1046  ? CO2 24 09/10/2021 1046  ? GLUCOSE 115 (H) 09/10/2021 1046  ? BUN 25 (H) 09/10/2021 1046  ? BUN 24 09/07/2021 1406  ? CREATININE 1.85 (H) 09/10/2021 1046  ? CALCIUM 9.3 09/10/2021 1046  ? PROT 7.2 09/10/2021 1046  ? PROT 7.2 08/10/2021 1538  ? ALBUMIN 4.0 09/10/2021 1046  ? ALBUMIN 4.9 08/10/2021 1538  ? AST 16 09/10/2021 1046  ? ALT 12 09/10/2021 1046  ? ALKPHOS 60 09/10/2021 1046  ? BILITOT 0.7 09/10/2021 1046  ? BILITOT 0.2 08/10/2021 1538  ? GFRNONAA 42 (L) 09/10/2021 1046  ? GFRAA >90 11/15/2012 1044  ? ?COAGS ?Lab Results  ?Component Value Date  ? INR 0.9 09/10/2021  ? INR 1.0  01/10/2021  ? ?Lipid Panel ?   ?Component Value Date/Time  ? CHOL 113 09/11/2021 0234  ? TRIG 94 09/11/2021 0234  ? HDL 26 (L) 09/11/2021 0234  ? CHOLHDL 4.3 09/11/2021 0234  ? VLDL 19 09/11/2021 0234  ? LDLCALC 68 09/11/2021 0234  ? ?HgbA1C  ?Lab Results  ?Component Value Date  ? HGBA1C 6.2 (H) 09/10/2021  ? ?Urinalysis ?   ?Component Value Date/Time  ? COLORURINE YELLOW 09/10/2021 1113  ? APPEARANCEUR CLEAR 09/10/2021 1113  ? LABSPEC 1.014 09/10/2021 1113  ? PHURINE 5.0 09/10/2021 1113  ? GLUCOSEU NEGATIVE 09/10/2021 1113  ? HGBUR NEGATIVE 09/10/2021 1113  ? BILIRUBINUR NEGATIVE 09/10/2021 1113  ? KETONESUR NEGATIVE 09/10/2021 1113  ? PROTEINUR NEGATIVE 09/10/2021 1113  ? UROBILINOGEN 1.0 10/13/2006 0742  ? NITRITE NEGATIVE 09/10/2021 1113  ? LEUKOCYTESUR NEGATIVE 09/10/2021 1113  ? ?Urine Drug Screen  ?   ?Component  Value Date/Time  ? LABOPIA NONE DETECTED 01/10/2021 1400  ? COCAINSCRNUR POSITIVE (A) 01/10/2021 1400  ? LABBENZ NONE DETECTED 01/10/2021 1400  ? AMPHETMU NONE DETECTED 01/10/2021 1400  ? THCU POSITIVE (A) 01/10/2021 1400  ? LABBARB NONE DETECTED 01/10/2021 1400  ?  ?Alcohol Level ?   ?Component Value Date/Time  ? ETH <10 09/10/2021 1113  ? ? ? ?SIGNIFICANT DIAGNOSTIC STUDIES ?CT Head Wo Contrast ? ?Result Date: 09/10/2021 ?CLINICAL DATA:  Altered mental status EXAM: CT HEAD WITHOUT CONTRAST TECHNIQUE: Contiguous axial images were obtained from the base of the skull through the vertex without intravenous contrast. RADIATION DOSE REDUCTION: This exam was performed according to the departmental dose-optimization program which includes automated exposure control, adjustment of the mA and/or kV according to patient size and/or use of iterative reconstruction technique. COMPARISON:  09/10/2021 at 1101 hours FINDINGS: Brain: No evidence of acute infarction, hemorrhage, hydrocephalus, extra-axial collection or mass lesion/mass effect. Stable chronic right occipital lobe infarct. Chronic bilateral basal ganglia lacunar infarcts. Scattered low-density changes within the periventricular and subcortical white matter compatible with chronic microvascular ischemic change. Mild diffuse cerebral volume loss. Vascular: Intravascular contrast is present. Skull: Normal. Negative for fracture or focal lesion. Sinuses/Orbits: No acute finding. Other: None. IMPRESSION: No acute intracranial abnormality.  Stable exam. Electronically Signed   By: Duanne GuessNicholas  Plundo D.O.   On: 09/10/2021 12:23  ? ?MR BRAIN WO CONTRAST ? ?Result Date: 09/11/2021 ?CLINICAL DATA:  56 year old male with altered mental status. Code stroke presentation yesterday. EXAM: MRI HEAD WITHOUT CONTRAST TECHNIQUE: Multiplanar, multiecho pulse sequences of the brain and surrounding structures were obtained without intravenous contrast. COMPARISON:  Head CT x2 yesterday. Brain  MRI 01/10/2021. FINDINGS: Brain: Roughly 3 cm area of cortical and subcortical white matter restricted diffusion in the posterior left MCA territory affects the perirolandic area on series 9, image 85 and series 11, image 58. Associated mildly T2 and FLAIR hyperintense cytotoxic edema there. And there is gyrus susceptibility on SWI at the motor strip on series 16, image 38. Cannot exclude petechial hemorrhage. But no regional mass effect. No other diffusion restriction. But developing encephalomalacia in the right occipital pole or is new since last year (series 9, image 76) with mild hemosiderin. Chronic bilateral deep gray matter nuclei lacunar infarcts. Heterogeneous bilateral white matter involvement in glued Ng some of the deep white matter capsules. And new conspicuous right side brainstem Wallerian degeneration since last year (series 19, image 9). No superimposed midline shift, mass effect, evidence of mass lesion, ventriculomegaly, extra-axial collection. Cervicomedullary junction and pituitary are within normal limits. Vascular: Major intracranial vascular  flow voids are stable since last year. Distal left vertebral artery appears dominant. Skull and upper cervical spine: Partially visible cervical spine degeneration. Visualized bone marrow signal is within normal limits. Sinuses/Orbits: Stable, negative. Other: Negative visible scalp and face. IMPRESSION: 1. Acute posterior Left MCA, perirolandic infarct with possible petechial hemorrhage, but no malignant hemorrhagic transformation or mass effect. 2. Progressed right PCA territory ischemic disease since last year. And new right brainstem Wallerian degeneration. Underlying advanced chronic small vessel disease. Electronically Signed   By: Odessa Fleming M.D.   On: 09/11/2021 10:32  ? ?DG Swallowing Func-Speech Pathology ? ?Result Date: 09/12/2021 ?Table formatting from the original result was not included. Objective Swallowing Evaluation: Type of Study:  MBS-Modified Barium Swallow Study  Patient Details Name: JAHMEL FLANNAGAN MRN: [id number] Date of Birth: 01/24/1966 Today's Date: 09/12/2021 Time: SLP Start Time (ACUTE ONLY): 1218 -SLP Stop Time (ACUTE ONLY): 1010 SLP T

## 2021-09-14 NOTE — Progress Notes (Signed)
Patient admitted to CIR 4w-11 at 1230pm. Patient transferred to wheelchair to bed. Skin assessment performed. Blanchable redness noted to sacrum. Patient education provided on offloading and repositioning. Able to communicate with yes or no questions through nodding. Provided with white board for additional communication. Oriented to room/unit. Call bell within reach. Bed to lowest setting. Alarms in place.  ?

## 2021-09-14 NOTE — Progress Notes (Signed)
Jennye Boroughs, MD  ?Physician ?Physical Medicine and Rehabilitation ?PMR Pre-admission     ?Signed ?Date of Service:  09/12/2021  2:54 PM ? Related encounter: ED to Hosp-Admission (Discharged) from 09/10/2021 in [place] ?  ?Signed    ?  ?Show:Clear all ?[x] Written[x] Templated[x] Copied ? ?Added by: ?[x] Reem Fleury, Vertis Kelch, RN[x] Jennye Boroughs, MD ? ?[] Hover for details ?   ?   ?   ?   ?   ?   ?   ?   ?   ?   ?   ?   ?   ?   ?   ?   ?   ?   ?   ?   ?   ?   ?   ?   ?   ?   ?   ?   ?   ?   ?   ?   ?   ?   ?   ?   ?   ?   ?   ?   ?   ?   ?   ?   ?   ?   ?   ?   ?   ?   ?   ?   ?   ?   ?   ?   ?   ?   ?   ?   ?   ?   ?   ?   ?   ?   ?   ?   ?   ?   ?   ?   ?   ?   ?   ?   ?   ?   ?   ?   ?   ?   ?   ?   ?   ?   ?   ?   ?   ?   ?   ?   ?   ?   ?   ?   ?   ?   ?   ?   ?   ?   ?   ?   ?   ?   ?   ?   ?   ?   ?   ?   ?   ?   ?   ?   ?   ?   ?   ?   ?   ?   ?   ?   ?   ?   ?   ?   ?   ?   ?   ?   ?   ?   ?   ?   ?   ?   ?   ?   ?   ?PMR Admission Coordinator Pre-Admission Assessment ?  ?Patient: Christopher [name] is an 56 y.o., male ?MRN: [id number] ?DOB: 1966/03/30 ?Height:   ?Weight:   ?  ?Insurance Information ?HMO:     PPO:      PCP:      IPA:      80/20:      OTHER:  ?PRIMARY: Uninsured ?  ?Estimate of Cost of care provided to patient and sister   ?  ?Financial Counselor:       Phone#:  ?  ?The ?Data Collection Information Summary? for patients in Inpatient Rehabilitation Facilities with attached ?Privacy Act [place] Records? was provided and verbally reviewed  with: N/A ?  ?Emergency Contact Information ?Contact Information   ?  ?  Name Relation Home Work Mobile  ?  McCuiston,Peggy Sister     [phone]  ?  ?   ?  ?Current Medical History  ?Patient Admitting Diagnosis: CVA ?  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  ?  ?CT head  with right occipital infarct new since September, but appeared to be chronic. CTA head and neck positive for new short segment occlusion of the fight vertebral artery V1 segment which has progressed since September. Reconstituted and diminutive but patent right vertebral. 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 MRI acute posterior left MCA, peri rolandic infarct with possible petechial hemorrhage , but no malignant hemorrhagic transforation or mass effect. LDL 68, Hgb A1c 6.2. VTE prophylaxis Lovenox. No antithrombotic pta , now on Asa. Home Meds of Lisinopril . To resume home atorvastatin.  ?  ?Complete NIHSS TOTAL: 4 ?  ?Patient's medical record from [facility] has been reviewed by the rehabilitation admission coordinator and physician. ?  ?Past Medical History  ?    ?Past Medical History:  ?Diagnosis Date  ? PUD (peptic ulcer disease) 2008  ?  treated with meds  ?  ?Has the patient had major surgery during 100 days prior to admission? No ?  ?Family History   ?family history includes Cancer in his mother; Stroke in his father. ?  ?Current Medications ?  ?Current Facility-Administered Medications:  ?   stroke: early stages of recovery book, , Does not apply, Once, Beulah Gandy A, NP ?  0.9 %  sodium chloride infusion, , Intravenous, Continuous, August Albino, NP, Last Rate: 50 mL/hr at 09/13/21 2102, New Bag at 09/13/21 2102 ?  acetaminophen (TYLENOL) tablet 650 mg, 650 mg, Oral, Q4H PRN **OR** acetaminophen (TYLENOL) 160 MG/5ML solution 650 mg, 650 mg, Per Tube, Q4H PRN **OR** acetaminophen (TYLENOL) suppository 650 mg, 650 mg, Rectal, Q4H PRN, Beulah Gandy A, NP ?  aspirin tablet 325 mg, 325 mg, Oral, Daily, Shafer, Devon, NP, 325 mg at 09/13/21 [id number] ?  Chlorhexidine Gluconate Cloth 2 % PADS 6 each, 6 each, Topical, Daily, Janine Ores, NP, 6 each at 09/13/21 0947 ?  food thickener (SIMPLYTHICK (NECTAR/LEVEL 2/MILDLY THICK)) 10 packet, 10 packet, Oral, PRN, Kerney Elbe,  MD ?  labetalol (NORMODYNE) injection 10 mg, 10 mg, Intravenous, Q15 min PRN, Beulah Gandy A, NP ?  pantoprazole (PROTONIX) EC tablet 40 mg, 40 mg, Oral, Daily, Mignon Pine, RPH, 40 mg at 09/13/21 [id number] ?  senna-docusate (Senokot-S) tablet 1 tablet, 1 tablet, Oral, QHS PRN, Beulah Gandy A, NP ?  sodium chloride flush (NS) 0.9 % injection 3 mL, 3 mL, Intravenous, Once, Kommor, Madison, MD ?  ?Patients Current Diet:  ?Diet Order   ?  ?         ?    DIET DYS 2 Room service appropriate? Yes with Assist; Fluid consistency: Thin  Diet effective now       ?  ?  ?   ?  ?  ?   ?  ?Precautions / Restrictions ?Precautions ?Precautions: Fall ?Precaution Comments: expressive aphasia ?Restrictions ?Weight Bearing Restrictions: No  ?  ?Has the patient had 2 or more falls or a fall with injury in the past year? No ?  ?Prior Activity  Level ?Community (5-7x/wk): Mod I with SPC ?  ?Prior Functional Level ?Self Care: Did the patient need help bathing, dressing, using the toilet or eating? Independent ?  ?Indoor Mobility: Did the patient need assistance with walking from room to room (with or without device)? Independent ?  ?Stairs: Did the patient need assistance with internal or external stairs (with or without device)? Independent ?  ?Functional Cognition: Did the patient need help planning regular tasks such as shopping or remembering to take medications? Independent ?  ?Patient Information ?Are you of Hispanic, Latino/a,or Spanish origin?: A. No, not of Hispanic, Latino/a, or Spanish origin ?What is your race?: A. White ?Do you need or want an interpreter to communicate with a doctor or health care staff?: 0. No ?  ?Patient's Response To:  ?Health Literacy and Transportation ?Is the patient able to respond to health literacy and transportation needs?: Yes ?Health Literacy - How often do you need to have someone help you when you read instructions, pamphlets, or other written material from your doctor or pharmacy?:  Sometimes ?In the past 12 months, has lack of transportation kept you from medical appointments or from getting medications?: No ?In the past 12 months, has lack of transportation kept you from meetings, work, or from getting things needed for daily living?: No ?  ?Home Assistive Devices / Equipment ?Home Equipment: Kasandra Knudsen - single point ?  ?Prior Device Use: Indicate devices/aids used by the patient prior to current illness, exacerbation or injury?  SPC ?  ?Current Functional Level ?Cognition ?  Arousal/Alertness: Awake/alert ?Overall Cognitive Status: Difficult to assess ?Difficult to assess due to: Impaired communication ?Orientation Level: Oriented X4 ?General Comments: follows multistep commands with increased cuing, single step commands consistently ?Attention: Sustained ?Sustained Attention: Appears intact ?Memory: Appears intact ?   ?Extremity Assessment ?(includes Sensation/Coordination) ?  Upper Extremity Assessment: LUE deficits/detail ?RUE Deficits / Details: weaker distally, able to use extremity as a funcitonal assist. full PROM. poor coordination ?RUE Coordination: decreased fine motor, decreased gross motor ?LUE Deficits / Details: WFL but generally weak - pt is LHD  ?Lower Extremity Assessment: Defer to PT evaluation ?RLE Deficits / Details: grossly 4/5 strength  ?   ?ADLs ?  Overall ADL's : Needs assistance/impaired ?Eating/Feeding: Set up, Sitting ?Eating/Feeding Details (indicate cue type and reason): eating upon arrival, food soiled face, beard and gown ?Grooming: Minimal assistance, Sitting ?Grooming Details (indicate cue type and reason): min A for thoroughness ?Upper Body Bathing: Minimal assistance, Sitting ?Lower Body Bathing: Moderate assistance, Sit to/from stand ?Upper Body Dressing : Minimal assistance, Sitting, Cueing for compensatory techniques ?Upper Body Dressing Details (indicate cue type and reason): cues to place RUE first ?Lower Body Dressing: Moderate assistance, Sitting/lateral  leans ?Lower Body Dressing Details (indicate cue type and reason): to don sock ?Toilet Transfer: Min guard, Ambulation, Regular Toilet ?Toilet Transfer Details (indicate cue type and reason): simulated in roo

## 2021-09-14 NOTE — Progress Notes (Signed)
Inpatient Rehabilitation Admission Medication Review by a Pharmacist ? ?A complete drug regimen review was completed for this patient to identify any potential clinically significant medication issues. ? ?High Risk Drug Classes Is patient taking? Indication by Medication  ?Antipsychotic Yes Compazine prn for N/V  ?Anticoagulant Yes Lovenox for VTE ppx  ?Antibiotic No   ?Opioid No   ?Antiplatelet No   ?Hypoglycemics/insulin No   ?Vasoactive Medication No   ?Chemotherapy No   ?Other Yes Aspirin for CVA ppx ?Lipitor for HLD ?Protonix for GERD  ? ? ? ?Type of Medication Issue Identified Description of Issue Recommendation(s)  ?Drug Interaction(s) (clinically significant) ?    ?Duplicate Therapy ?    ?Allergy ?    ?No Medication Administration End Date ?    ?Incorrect Dose ?    ?Additional Drug Therapy Needed ?    ?Significant med changes from prior encounter (inform family/care partners about these prior to discharge). Lisinopril PTA - not yet resumed Resume if and when appropriate  ?Other ?    ? ? ?Clinically significant medication issues were identified that warrant physician communication and completion of prescribed/recommended actions by midnight of the next day:  No ? ?Name of provider notified for urgent issues identified:  ? ?Provider Method of Notification:  ? ? ? ?Pharmacist comments:  ? ?Time spent performing this drug regimen review (minutes):  20 minutes ? ? ?Christopher [name] ?09/14/2021 1:11 PM ?

## 2021-09-14 NOTE — TOC Transition Note (Signed)
Transition of Care (TOC) - CM/SW Discharge Note ? ? ?Patient Details  ?Name: Christopher [name] ?MRN: [id number] ?Date of Birth: May 05, 1966 ? ?Transition of Care (TOC) CM/SW Contact:  ?Kermit Balo, RN ?Phone Number: ?09/14/2021, 10:23 AM ? ? ?Clinical Narrative:    ?Patient is discharging to CIR today. CM signing off.  ? ? ?Final next level of care: IP Rehab Facility ?Barriers to Discharge: No Barriers Identified ? ? ?Patient Goals and CMS Choice ?  ?  ?Choice offered to / list presented to : Patient ? ?Discharge Placement ?  ?           ?  ?  ?  ?  ? ?Discharge Plan and Services ?  ?  ?           ?  ?  ?  ?  ?  ?  ?  ?  ?  ?  ? ?Social Determinants of Health (SDOH) Interventions ?  ? ? ?Readmission Risk Interventions ?   ? View : No data to display.  ?  ?  ?  ? ? ? ? ? ?

## 2021-09-14 NOTE — Progress Notes (Signed)
While placing patient's personal belonging in drawer a pack of cigarette fell from his jacket pocket and a lighter.Explained to this patient that these items would have to be removed and will be placed at Nursing station until discharge or can be picked up. Patient acknowledged with issues or concerns ?

## 2021-09-14 NOTE — H&P (Signed)
? ? ? ?Physical Medicine and Rehabilitation Admission H&P ?  ?  ?CC: Deficits secondary to left MCA stroke ?  ?HPI: Christopher [name] is a right handed 56 year old male who presented to the emergency department on 09/10/2021 complaining of garbled speech and right arm weakness.  Code stroke was activated and Dr. Leonel Ramsay evaluated.  CT was negative for acute stroke and CTA was negative for large vessel occlusion.  Tenecteplase was given at 11:08 AM.  Patient has a history of cocaine use and reported last use was 5 days prior.  He was admitted to the neuro ICU for further management.  Physical exam revealed right facial droop, right arm weakness, right leg weakness, right decreased sensation and dysarthria.  Imaging consistent with left MCA and perirolandic infarct likely secondary to atherosclerosis and cocaine use.  Possible petechial hemorrhage but no malignant hemorrhagic transformation or mass effect.  A right occipital infarct is new since September but appears chronic.  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  Patient is tolerating a dysphagia 2 diet with thin liquids.  2D echocardiogram with ejection fraction of 60 to 65%.  Hemoglobin A1c 6.2.   ?  ?The patient's past medical history is significant for hypertension on lisinopril, hyperlipidemia and peptic ulcer disease on PPI.  He was evaluated in September 2022 for possible stroke and MRI revealed acute lacunar infarct within the right thalamic capsular junction.  There was a probable subacute infarct within the subcortical white matter of the right frontal lobe.  Chronic lacunar infarcts noted. He was admitted to inpatient rehab last year.  ? ?  ?His urine drug screen was positive for cocaine and tetrahydrocannabinol.  Ongoing tobacco use approximately 1/2 pack/day.  Endorses current alcohol use. ? ?Today he denies any difficulty with pain. He often answers by writing or using thumbs up or  thumbs down with his left hand. ?  ?The patient requires inpatient medicine and rehabilitation evaluations and services for ongoing dysfunction secondary to left MCA stroke. ?  ?Able to write legibly on white board with left hand. ?Review of Systems  ?Constitutional:  Negative for chills and fever.  ?HENT:  Negative for hearing loss and sore throat.   ?Eyes:  Negative for blurred vision and double vision.  ?Respiratory:  Negative for cough and sputum production.   ?Cardiovascular:  Negative for chest pain and palpitations.  ?Gastrointestinal:  Negative for constipation, nausea and vomiting.  ?Genitourinary:  Negative for dysuria and urgency.  ?Neurological:  Positive for focal weakness. Negative for dizziness and headaches.  Positive for altered speech.  ?Psychiatric/Behavioral:  Negative for depression. The patient does not have insomnia.   ?    ?Past Medical History:  ?Diagnosis Date  ? PUD (peptic ulcer disease) 2008  ?  treated with meds  ?  ?History reviewed. No pertinent surgical history. ?     ?Family History  ?Problem Relation Age of Onset  ? Cancer Mother    ? Stroke Father    ?  ?Social History:  reports that he has been smoking cigarettes. He has been smoking an average of .5 packs per day. He has quit using smokeless tobacco. He reports current alcohol use. He reports current drug use. Drug: "Crack" cocaine. ?Allergies: No Known Allergies ?      ?Medications Prior to Admission  ?Medication Sig Dispense Refill  ? acetaminophen (TYLENOL) 500 MG tablet Take 500 mg by mouth every 6 (six) hours as needed for moderate  pain.      ? aspirin 81 MG chewable tablet Chew 1 tablet (81 mg total) by mouth daily. 100 tablet 1  ? atorvastatin (LIPITOR) 20 MG tablet Take 1 tablet (20 mg total) by mouth daily. 90 tablet 1  ? lisinopril (ZESTRIL) 20 MG tablet Take 1 tablet (20 mg total) by mouth daily. 90 tablet 1  ? omeprazole (PRILOSEC) 20 MG capsule Take 1 capsule (20 mg total) by mouth daily. 30 capsule 3  ?  acetaminophen (TYLENOL) 325 MG tablet Take 1-2 tablets (325-650 mg total) by mouth every 4 (four) hours as needed for mild pain. (Patient not taking: Reported on 09/10/2021)      ?  ?  ?  ?  ?Home: ?Home Living ?Family/patient expects to be discharged to:: Private residence ?Living Arrangements:  (Lives with sister, Christopher [name], since [place] 9/22) ?Available Help at Discharge: Available 24 hours/day, Family ?Type of Home: House ?Home Access: Stairs to enter ?Entrance Stairs-Number of Steps: 3 ?Entrance Stairs-Rails: Left ?Home Layout: One level ?Bathroom Shower/Tub: Walk-in shower ?Bathroom Toilet: Standard ?Bathroom Accessibility: Yes ?Home Equipment: Kasandra Knudsen - single point ?Additional Comments: need to confirm history. pt answering with yes/no head nods and may be unreliable due to aphasia ? Lives With: Family (sister, Christopher [name]) ?  ?Functional History: ?Prior Function ?Prior Level of Function : Independent/Modified Independent ?Mobility Comments: pt indicates he was ambulating with a SPC ?ADLs Comments: Pt reports his sister assists with cooking and cleaning ?  ?Functional Status:  ?Mobility: ?Bed Mobility ?Overal bed mobility: Needs Assistance ?Bed Mobility: Rolling, Sidelying to Sit ?Rolling: Supervision ?Sidelying to sit: Supervision ?Supine to sit: Supervision, HOB elevated ?General bed mobility comments: transition without assist needs, cued for R UE forgotten and left behind as trunk rolled. ?Transfers ?Overall transfer level: Needs assistance ?Equipment used:  (railing) ?Transfers: Sit to/from Stand ?Sit to Stand: Min guard ?General transfer comment: cues for use of both UEs ?Ambulation/Gait ?Ambulation/Gait assistance: Min assist ?Gait Distance (Feet): 90 Feet ?Assistive device: 1 person hand held assist ?Gait Pattern/deviations: Step-through pattern ?General Gait Details: mildly paretic, uncoordinated heel /toe patter, more flat contact. Mild drift, 1-2 deviations with looking up/down/scanning. ?Gait velocity:  reduced ?Gait velocity interpretation: <1.8 ft/sec, indicate of risk for recurrent falls ?  ?ADL: ?ADL ?Overall ADL's : Needs assistance/impaired ?Eating/Feeding: Set up, Sitting ?Eating/Feeding Details (indicate cue type and reason): eating upon arrival, food soiled face, beard and gown ?Grooming: Minimal assistance, Sitting ?Grooming Details (indicate cue type and reason): min A for thoroughness ?Upper Body Bathing: Minimal assistance, Sitting ?Lower Body Bathing: Moderate assistance, Sit to/from stand ?Upper Body Dressing : Minimal assistance, Sitting, Cueing for compensatory techniques ?Upper Body Dressing Details (indicate cue type and reason): cues to place RUE first ?Lower Body Dressing: Maximal assistance, Sit to/from stand ?Toilet Transfer: Minimal assistance, Stand-pivot ?Toileting- Clothing Manipulation and Hygiene: Minimal assistance, Sit to/from stand ?Functional mobility during ADLs: Minimal assistance ?General ADL Comments: limited by expressive communication difficulties, R weakness and limited ROM ?  ?Cognition: ?Cognition ?Overall Cognitive Status: Difficult to assess ?Arousal/Alertness: Awake/alert ?Orientation Level: Oriented X4 ?Attention: Sustained ?Sustained Attention: Appears intact ?Memory: Appears intact ?Cognition ?Arousal/Alertness: Awake/alert ?Behavior During Therapy: Flat affect ?Overall Cognitive Status: Difficult to assess ?General Comments: completed 2/3 commands, forgot the last step. ?Difficult to assess due to: Impaired communication ?  ?Physical Exam: ?Blood pressure 131/76, pulse 93, temperature 98.5 ?F (36.9 ?C), temperature source Oral, resp. rate 18, SpO2 94 %. ?Physical Exam ? ? ?General: Alert and oriented x 3, No apparent distress ?HEENT:  Head is normocephalic, atraumatic, PERRLA, EOMI, sclera anicteric, oral mucosa pink and moist, tongue exudate. glasses ?Neck: Supple without JVD or lymphadenopathy ?Heart: Reg rate and rhythm. No murmurs rubs or gallops ?Chest: CTA  bilaterally without wheezes, rales, or rhonchi; no distress ?Abdomen: Soft, non-tender, non-distended, bowel sounds positive. ?Extremities: No clubbing, cyanosis, or edema. Pulses are 2+ ?Psych: Pt's affect is appropriate. Pt is c

## 2021-09-14 NOTE — Progress Notes (Signed)
Inpatient Rehabilitation Admissions Coordinator  ? ?CIR bed is available today . I contacted Dr Pearlean Brownie and will arrange Cir admit . I spoke with his sister, [name] by phone and she is in agreement to admit today. I will alert acute team and TOC and will make the arrangements. ? ?Ottie Glazier, RN, MSN ?Rehab Admissions Coordinator ?[phone] ?09/14/2021 9:46 AM ? ?

## 2021-09-14 NOTE — Progress Notes (Signed)
Patient ID: Christopher [name], male   DOB: Oct 26, 1965, 56 y.o.   MRN: [id number] ?Met with the patient to review rehab process, team conference and plan of care. Patient with expressive aphasia and new right UE weakness. Uses communication board. Reviewed secondary risk management including HTN, HLD, prediabetes and smoking/ETOH use. Reviewed HH dietary modifications with prediabetes menu change recommendations. Patient noted understanding of ASA for secondary stroke prevention but not yet willing to stop smoking. Continue to follow along to discharge to address educational needs to facilitate preparation for discharge. [name] ? ?

## 2021-09-15 DIAGNOSIS — N179 Acute kidney failure, unspecified: Secondary | ICD-10-CM

## 2021-09-15 DIAGNOSIS — F1910 Other psychoactive substance abuse, uncomplicated: Secondary | ICD-10-CM

## 2021-09-15 LAB — CBC WITH DIFFERENTIAL/PLATELET
Abs Immature Granulocytes: 0.01 10*3/uL (ref 0.00–0.07)
Basophils Absolute: 0.1 10*3/uL (ref 0.0–0.1)
Basophils Relative: 1 %
Eosinophils Absolute: 0.3 10*3/uL (ref 0.0–0.5)
Eosinophils Relative: 3 %
HCT: 36.3 % — ABNORMAL LOW (ref 39.0–52.0)
Hemoglobin: 12.4 g/dL — ABNORMAL LOW (ref 13.0–17.0)
Immature Granulocytes: 0 %
Lymphocytes Relative: 24 %
Lymphs Abs: 1.9 10*3/uL (ref 0.7–4.0)
MCH: 30 pg (ref 26.0–34.0)
MCHC: 34.2 g/dL (ref 30.0–36.0)
MCV: 87.7 fL (ref 80.0–100.0)
Monocytes Absolute: 0.7 10*3/uL (ref 0.1–1.0)
Monocytes Relative: 10 %
Neutro Abs: 4.9 10*3/uL (ref 1.7–7.7)
Neutrophils Relative %: 62 %
Platelets: 298 10*3/uL (ref 150–400)
RBC: 4.14 MIL/uL — ABNORMAL LOW (ref 4.22–5.81)
RDW: 11.7 % (ref 11.5–15.5)
WBC: 7.8 10*3/uL (ref 4.0–10.5)
nRBC: 0 % (ref 0.0–0.2)

## 2021-09-15 LAB — COMPREHENSIVE METABOLIC PANEL
ALT: 11 U/L (ref 0–44)
AST: 12 U/L — ABNORMAL LOW (ref 15–41)
Albumin: 3.4 g/dL — ABNORMAL LOW (ref 3.5–5.0)
Alkaline Phosphatase: 56 U/L (ref 38–126)
Anion gap: 10 (ref 5–15)
BUN: 13 mg/dL (ref 6–20)
CO2: 28 mmol/L (ref 22–32)
Calcium: 9.4 mg/dL (ref 8.9–10.3)
Chloride: 100 mmol/L (ref 98–111)
Creatinine, Ser: 1.29 mg/dL — ABNORMAL HIGH (ref 0.61–1.24)
GFR, Estimated: >60 mL/min (ref >60)
Glucose, Bld: 108 mg/dL — ABNORMAL HIGH (ref 70–99)
Potassium: 4.1 mmol/L (ref 3.5–5.1)
Sodium: 138 mmol/L (ref 135–145)
Total Bilirubin: 0.6 mg/dL (ref 0.3–1.2)
Total Protein: 6.5 g/dL (ref 6.5–8.1)

## 2021-09-15 NOTE — Progress Notes (Signed)
Inpatient Rehabilitation Center ?Individual Statement of Services ? ?Patient Name:  Christopher [name]  ?Date:  09/15/2021 ? ?Welcome to the Inpatient Rehabilitation Center.  Our goal is to provide you with an individualized program based on your diagnosis and situation, designed to meet your specific needs.  With this comprehensive rehabilitation program, you will be expected to participate in at least 3 hours of rehabilitation therapies Monday-Friday, with modified therapy programming on the weekends. ? ?Your rehabilitation program will include the following services:  Physical Therapy (PT), Occupational Therapy (OT), Speech Therapy (ST), 24 hour per day rehabilitation nursing, Therapeutic Recreaction (TR), Neuropsychology, Care Coordinator, Rehabilitation Medicine, Nutrition Services, Pharmacy Services, and Other ? ?Weekly team conferences will be held on Wednesdays to discuss your progress.  Your Inpatient Rehabilitation Care Coordinator will talk with you frequently to get your input and to update you on team discussions.  Team conferences with you and your family in attendance may also be held. ? ?Expected length of stay:  10-12 Days  Overall anticipated outcome:  Mod I to Supervision ? ?Depending on your progress and recovery, your program may change. Your Inpatient Rehabilitation Care Coordinator will coordinate services and will keep you informed of any changes. Your Inpatient Rehabilitation Care Coordinator's name and contact numbers are listed  below. ? ?The following services may also be recommended but are not provided by the Inpatient Rehabilitation Center:  ? ?Home Health Rehabiltiation Services ?Outpatient Rehabilitation Services ? ?  ?Arrangements will be made to provide these services after discharge if needed.  Arrangements include referral to agencies that provide these services. ? ?Your insurance has been verified to be:   uninsured  ?Your primary doctor is:   Georganna Skeans, MD ? ?Pertinent  information will be shared with your doctor and your insurance company. ? ?Inpatient Rehabilitation Care Coordinator:  [name], [place] [phone] or [phone] ? ?Information discussed with and copy given to patient by: Andria Rhein, 09/15/2021, 1:18 PM    ?

## 2021-09-15 NOTE — Progress Notes (Signed)
Inpatient Rehabilitation Care Coordinator ?Assessment and Plan ?Patient Details  ?Name: Christopher [name] ?MRN: [id number] ?Date of Birth: 15-Nov-1965 ? ?Today's Date: 09/15/2021 ? ?Hospital Problems: Principal Problem: ?  Acute ischemic left MCA stroke ([place]) ? ?Past Medical History:  ?Past Medical History:  ?Diagnosis Date  ? PUD (peptic ulcer disease) 2008  ? treated with meds  ? ?Past Surgical History: History reviewed. No pertinent surgical history. ?Social History:  reports that he has been smoking cigarettes. He has been smoking an average of .5 packs per day. He has quit using smokeless tobacco. He reports current alcohol use. He reports current drug use. Drug: "Crack" cocaine. ? ?Family / Support Systems ?Other Supports: sister, [name] ?Anticipated Caregiver: [name] ?Ability/Limitations of Caregiver: none ?Caregiver Availability: 24/7 ?Family Dynamics: support form sister ? ?Social History ?Preferred language: English ?Religion: Non-Denominational ?Health Literacy - How often do you need to have someone help you when you read instructions, pamphlets, or other written material from your doctor or pharmacy?: Always ?Writes: Yes  ? ?Abuse/Neglect ?Abuse/Neglect Assessment Can Be Completed: Yes ?Physical Abuse: Denies ?Verbal Abuse: Denies ?Sexual Abuse: Denies ?Exploitation of patient/patient's resources: Denies ?Self-Neglect: Denies ? ?Patient response to: ?Social Isolation - How often do you feel lonely or isolated from those around you?: Never ? ?Emotional Status ?Recent Psychosocial Issues: coping ?Psychiatric History: n/a ?Substance Abuse History: cocaine and tobacco ? ?Patient / Family Perceptions, Expectations & Goals ?Pt/Family understanding of illness & functional limitations: yes ?Premorbid pt/family roles/activities: MOD I with SPC ?Anticipated changes in roles/activities/participation: sister able to provide supervision ?Pt/family expectations/goals: MOD I to Supervision ? ?Community Resources ?Community  Agencies: None ?Premorbid Home Care/DME Agencies: Other (Comment) [facility]) ?Is the patient able to respond to transportation needs?: Yes ?In the past 12 months, has lack of transportation kept you from medical appointments or from getting medications?: No ?In the past 12 months, has lack of transportation kept you from meetings, work, or from getting things needed for daily living?: No ?Resource referrals recommended: Neuropsychology ? ?Discharge Planning ?Living Arrangements: Other relatives ?Support Systems: Other relatives ?Type of Residence: Private residence ?Financial Resources: Family Support ?Living Expenses: Lives with family ?Money Management: Family, Patient ?Does the patient have any problems obtaining your medications?: Yes (Describe) (Insurance) ?Home Management: Previosuly independent ?Care Coordinator Barriers to Discharge: Lack of/limited family support, Insurance for SNF coverage, Decreased caregiver support ?DC Planning Additional Notes/Comments: patient is uninsured ?Expected length of stay: 10-12 days ? ?Clinical Impression ?Sw met with patient, reintroduced self and explained role. Patient plans to discharge home with his sister. No additional questions or concerns, sw will continue to follow up. ? ?Dyanne Iha ?09/15/2021, 1:17 PM ? ?  ?

## 2021-09-15 NOTE — Discharge Summary (Signed)
Physician Discharge Summary  ?Patient ID: ?Christopher [name] ?MRN: [id number] ?DOB/AGE: Nov 20, 1965 56 y.o. ? ?Admit date: 09/14/2021 ?Discharge date: 09/21/2021 ? ?Discharge Diagnoses:  ?Principal Problem: ?  Acute ischemic left MCA stroke ([place]) ?Active problems: ?Deficits secondary to left MCA stroke ?Dysphagia ?Aphasia ?Hypertension ?Hyperlipidemia ?Prediabetes ?Polysubstance abuse ?Peptic ulcer disease ?Oral Candidiasis ? ?Discharged Condition: good ? ?Significant Diagnostic Studies: ? ?Labs:  ?Basic Metabolic Panel: ?Recent Labs  ?Lab 09/15/21 ?[id number] 09/19/21 ?[id number]  ?NA 138 134*  ?K 4.1 4.5  ?CL 100 96*  ?CO2 28 30  ?GLUCOSE 108* 112*  ?BUN 13 20  ?CREATININE 1.29* 1.36*  ?CALCIUM 9.4 9.5  ? ? ?CBC: ?Recent Labs  ?Lab 09/15/21 ?[id number] 09/19/21 ?[id number]  ?WBC 7.8 8.9  ?NEUTROABS 4.9  --   ?HGB 12.4* 13.8  ?HCT 36.3* 41.0  ?MCV 87.7 88.7  ?PLT 298 376  ? ? ?CBG: ?No results for input(s): GLUCAP in the last 168 hours. ? ? ?Brief HPI:   Christopher [name] is a 56 y.o. male presented to ED with right arm weakness and garbled speech. Received tenecteplase. MRI consistent with left MCA and perirolandic infarct likely secondary to arthrosclerosis and cocaine use. RUE weakness and aphasia persists. ? ? ?Hospital Course: Christopher [name] was admitted to rehab 09/14/2021 for inpatient therapies to consist of PT, ST and OT at least three hours five days a week. Past admission physiatrist, therapy team and rehab RN have worked together to provide customized collaborative inpatient rehab. Serum creatinine trending downward, increased to 1.36 on 5/15. Leukocytosis resolved. Glucose range 95-115 and CBGs discontinued. Advanced to D3 diet, thin liquids. Patient and family members counseled regarding food preparation and limiting distractions.  ?Advised regarding tobacco and alcohol cessation and importance of provider follow-up. No driving emphasized. ? ?Blood pressures were monitored on TID basis and remained controlled off  lisinopril. ? ?Rehab course: During patient's stay in rehab weekly team conferences were held to monitor patient's progress, set goals and discuss barriers to discharge. At admission, patient required ? ?He has had improvement in activity tolerance, balance, postural control as well as ability to compensate for deficits. He has had improvement in functional use RUE/LUE  and RLE/LLE as well as improvement in awareness. Significant improvement in expressive aphasia. ? ?Disposition: Home ?Discharge disposition: 01-Home or Self Care ? ? ? ? ? ? ? ?Diet: dysphagia 3, thin liquids ? ?Special Instructions: ? ?No driving, alcohol consumption or tobacco use.  ? ?30-35 minutes were spent on discharge planning and discharge summary.  ? ?Discharge Instructions   ? ? Ambulatory referral to Neurology   Complete by: As directed ?  ? An appointment is requested in approximately: 4 weeks  ? Ambulatory referral to Physical Medicine Rehab   Complete by: As directed ?  ? Hospital follow-up  ? Discharge patient   Complete by: As directed ?  ? Discharge disposition: 01-Home or Self Care  ? Discharge patient date: 09/21/2021  ? ?  ? ?Allergies as of 09/21/2021   ?No Known Allergies ?  ? ?  ?Medication List  ?  ? ?STOP taking these medications   ? ?food thickener Gel ?Commonly known as: SIMPLYTHICK (NECTAR/LEVEL 2/MILDLY THICK) ?  ?lisinopril 20 MG tablet ?Commonly known as: ZESTRIL ?  ? ?  ? ?TAKE these medications   ? ?acetaminophen 325 MG tablet ?Commonly known as: TYLENOL ?Take 1-2 tablets (325-650 mg total) by mouth every 4 (four) hours as needed for mild pain. ?What changed: Another medication with the same  name was removed. Continue taking this medication, and follow the directions you see here. ?  ?aspirin 325 MG tablet ?Take 1 tablet (325 mg total) by mouth daily. ?  ?atorvastatin 20 MG tablet ?Commonly known as: LIPITOR ?Take 1 tablet (20 mg total) by mouth daily. ?  ?omeprazole 20 MG capsule ?Commonly known as: PRILOSEC ?Take 1  capsule (20 mg total) by mouth daily. ?  ? ?  ? ? Follow-up Information   ? ? Dorna Mai, MD Follow up.   ?Specialty: Family Medicine ?Why: Call tomorrow to make arrangements for hospital follow-up ?Contact information: ?Psychologist, [address] ?[place] [address] ?[phone] ? ? ?  ?  ? ? [facility]. Call.   ?Why: Call tomorrow to make arrangements for hospital follow-up appointment ?Contact information: ?[place]     [place] [phone] ?[phone] ? ?  ?  ? ? Kirsteins, Luanna Salk, MD Follow up.   ?Specialty: Physical Medicine and Rehabilitation ?Why: office will call you to arrange your appt (sent) ?Contact information: ?[address] ?[phone] ?[place] [address] ?[phone] ? ? ?  ?  ? ?  ?  ? ?  ? ? ?Signed: ?Barbie Banner ?09/21/2021, 10:38 AM ?  ?

## 2021-09-15 NOTE — Plan of Care (Signed)
?  Problem: RH Swallowing ?Goal: LTG Patient will consume least restrictive diet using compensatory strategies with assistance (SLP) ?Description: LTG:  Patient will consume least restrictive diet using compensatory strategies with assistance (SLP) ?Flowsheets (Taken 09/15/2021 1649) ?LTG: Pt Patient will consume least restrictive diet using compensatory strategies with assistance of (SLP): Supervision ?Goal: LTG Pt will demonstrate functional change in swallow as evidenced by bedside/clinical objective assessment (SLP) ?Description: LTG: Patient will demonstrate functional change in swallow as evidenced by bedside/clinical objective assessment (SLP) ?Flowsheets (Taken 09/15/2021 1649) ?LTG: Patient will demonstrate functional change in swallow as evidenced by bedside/clinical objective assessment: Oropharyngeal swallow ?  ?Problem: RH Expression Communication ?Goal: LTG Patient will express needs/wants via multi-modal(SLP) ?Description: LTG:  Patient will express needs/wants via multi-modal communication (gestures/written, etc) with cues (SLP) ?Flowsheets (Taken 09/15/2021 1649) ?LTG: Patient will express needs/wants via multimodal communication (gestures/written, etc) with cueing (SLP): Supervision ?Goal: LTG Patient will increase speech intelligibility (SLP) ?Description: LTG: Patient will increase speech intelligibility at word/phrase/conversation level with cues, % of the time (SLP) ?Flowsheets (Taken 09/15/2021 1649) ?LTG: Patient will increase speech intelligibility (SLP): ? Minimal Assistance - Patient > 75% ? Moderate Assistance - Patient 50 - 74% ?  ?

## 2021-09-15 NOTE — Evaluation (Signed)
Occupational Therapy Assessment and Plan ? ?Patient Details  ?Name: Christopher [name] ?MRN: [id number] ?Date of Birth: 08-Sep-1965 ? ?OT Diagnosis: hemiplegia affecting non-dominant side ?Rehab Potential: Rehab Potential (ACUTE ONLY): Good ?ELOS: 5-7 days  ? ?Today's Date: 09/15/2021 ?OT Individual Time: [id number] ?OT Individual Time Calculation (min): 72 min    ? ?Hospital Problem: Principal Problem: ?  Acute ischemic left MCA stroke ([place]) ? ? ?Past Medical History:  ?Past Medical History:  ?Diagnosis Date  ? PUD (peptic ulcer disease) 2008  ? treated with meds  ? ?Past Surgical History: History reviewed. No pertinent surgical history. ? ?Assessment & Plan ?Clinical Impression: Patient is a 57 y.o. year old male who presented to the emergency department on 09/10/2021 complaining of garbled speech and right arm weakness.  Code stroke was activated and Dr. Leonel Ramsay evaluated.  CT was negative for acute stroke and CTA was negative for large vessel occlusion.  Tenecteplase was given at 11:08 AM.  Patient has a history of cocaine use and reported last use was 5 days prior.  He was admitted to the neuro ICU for further management.  Physical exam revealed right facial droop, right arm weakness, right leg weakness, right decreased sensation and dysarthria.  Imaging consistent with left MCA and perirolandic infarct likely secondary to atherosclerosis and cocaine use.  Possible petechial hemorrhage but no malignant hemorrhagic transformation or mass effect.  A right occipital infarct is new since September but appears chronic.  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  Patient is tolerating a dysphagia 2 diet with thin liquids.  2D echocardiogram with ejection fraction of 60 to 65%.  Hemoglobin A1c 6.2.   ?  ?The patient's past medical history is significant for hypertension on lisinopril, hyperlipidemia and peptic ulcer disease on PPI.  He was evaluated in  September 2022 for possible stroke and MRI revealed acute lacunar infarct within the right thalamic capsular junction.  There was a probable subacute infarct within the subcortical white matter of the right frontal lobe.  Chronic lacunar infarcts noted. He was admitted to inpatient rehab last year.  ?  ?  ?His urine drug screen was positive for cocaine and tetrahydrocannabinol.  Ongoing tobacco use approximately 1/2 pack/day.  Endorses current alcohol use. ?  ?Today he denies any difficulty with pain. He often answers by writing or using thumbs up or thumbs down with his left hand. ?  ?The patient requires inpatient medicine and rehabilitation evaluations and services for ongoing dysfunction secondary to left MCA stroke.  Patient transferred to CIR on 09/14/2021 .   ? ?Patient currently requires min with basic self-care skills secondary to muscle weakness, impaired timing and sequencing, abnormal tone, unbalanced muscle activation, and decreased coordination, and hemiplegia and decreased balance strategies.  Prior to hospitalization, patient could complete BADL/mobility with modified independent . ? ?Patient will benefit from skilled intervention to decrease level of assist with basic self-care skills, increase independence with basic self-care skills, and increase level of independence with iADL prior to discharge home with care partner.  Anticipate patient will require intermittent supervision and follow up outpatient. ? ?OT - End of Session ?Activity Tolerance: Tolerates 30+ min activity with multiple rests ?Endurance Deficit: No ?OT Assessment ?Rehab Potential (ACUTE ONLY): Good ?OT Patient demonstrates impairments in the following area(s): Balance;Endurance;Motor ?OT Basic ADL's Functional Problem(s): Eating;Grooming;Bathing;Dressing;Toileting ?OT Advanced ADL's Functional Problem(s): Simple Meal Preparation ?OT Transfers Functional Problem(s): Toilet;Tub/Shower ?OT Additional Impairment(s): Fuctional Use of  Upper Extremity ?  OT Plan ?OT Intensity: Minimum of 1-2 x/day, 45 to 90 minutes ?OT Frequency: 5 out of 7 days ?OT Duration/Estimated Length of Stay: 5-7 days ?OT Treatment/Interventions: Balance/vestibular training;Neuromuscular re-education;Self Care/advanced ADL retraining;Therapeutic Exercise;Disease mangement/prevention;DME/adaptive equipment instruction;UE/LE Strength taining/ROM;UE/LE Coordination activities;Splinting/orthotics;Community reintegration;Functional electrical stimulation;Patient/family education;Discharge planning;Functional mobility training;Psychosocial support;Therapeutic Activities ?OT Self Feeding Anticipated Outcome(s): mod I ?OT Basic Self-Care Anticipated Outcome(s): mod I to S ?OT Toileting Anticipated Outcome(s): mod I ?OT Bathroom Transfers Anticipated Outcome(s): S shower, mod I toilet ?OT Recommendation ?Patient destination: Home ?Follow Up Recommendations: Outpatient OT ?Equipment Recommended: To be determined ? ? ?OT Evaluation ?Precautions/Restrictions  ?Precautions ?Precautions: Fall ?Precaution Comments: dysarthria, RUE hemiparesis ?Restrictions ?Weight Bearing Restrictions: No ?General ?Chart Reviewed: Yes ?Response to Previous Treatment: Patient with no complaints from previous session ?Family/Caregiver Present: Yes (BIL) ? ?Pain ?Pain Assessment ?Pain Scale: 0-10 ?Pain Score: 0-No pain ?Home Living/Prior Functioning ?Home Living ?Family/patient expects to be discharged to:: Private residence ?Living Arrangements: Other relatives ?Available Help at Discharge: Available 24 hours/day, Family ?Type of Home: Mobile home ?Home Access: Stairs to enter ?Entrance Stairs-Number of Steps: 3 ?Entrance Stairs-Rails: Left ?Home Layout: One level ?Bathroom Shower/Tub: Walk-in shower ?Bathroom Toilet: Standard ?Bathroom Accessibility: Yes ? Lives With: Family (sister and BIL) ?IADL History ?Homemaking Responsibilities: No ?Education: 9th grade ?Occupation: Unemployed ?Type of Occupation:  worked on cars ?Leisure and Hobbies: going for walks outside ?Prior Function ?Level of Independence: Independent with basic ADLs (using SPC post previous CVA, but reports not using device now) ?Driving: No ?Vocation: Unemployed ?Vision ?Baseline Vision/History: 1 Wears glasses (readers) ?Ability to See in Adequate Light: 0 Adequate ?Patient Visual Report: No change from baseline ?Vision Assessment?: Yes ?Eye Alignment: Within Functional Limits ?Ocular Range of Motion: Within Functional Limits ?Alignment/Gaze Preference: Within Defined Limits ?Tracking/Visual Pursuits: Decreased smoothness of eye movement to LEFT superior field;Decreased smoothness of eye movement to LEFT inferior field ?Saccades: Decreased speed of saccadic movement ?Visual Fields: No apparent deficits ?Perception  ?Perception: Within Functional Limits ?Praxis ?Praxis: Intact ?Cognition ?Cognition ?Overall Cognitive Status: Within Functional Limits for tasks assessed ?Arousal/Alertness: Awake/alert ?Orientation Level: Person;Place;Situation ?Person: Oriented ?Place: Oriented ?Situation: Oriented ?Memory: Impaired ?Memory Impairment: Storage deficit;Decreased recall of new information ?Attention: Sustained ?Sustained Attention: Appears intact ?Selective Attention: Impaired ?Selective Attention Impairment: Verbal basic (distracted by external stimuli) ?Awareness: Appears intact ?Problem Solving: Appears intact ?Safety/Judgment: Appears intact ?Comments: Pt demonstrates good safety awareness and insight into deficits in busy gym environement. ?Brief Interview for Mental Status (BIMS) ?Repetition of Three Words (First Attempt): 3 ?Temporal Orientation: Year: Correct ?Temporal Orientation: Month: Accurate within 5 days ?Temporal Orientation: Day: Correct ?Recall: "Sock": Yes, after cueing ("something to wear") ?Recall: "Blue": Yes, no cue required ?Recall: "Bed": Yes, no cue required ?BIMS Summary Score: 14 ?Sensation ?Sensation ?Light Touch: Appears  Intact ?Hot/Cold: Appears Intact ?Proprioception: Appears Intact ?Stereognosis: Impaired Detail ?Stereognosis Impaired Details: Impaired RUE ?Additional Comments: endorses numbness R forearm > R digits and in R toes,

## 2021-09-15 NOTE — Progress Notes (Signed)
?                                                       PROGRESS NOTE ? ? ?Subjective/Complaints: ?Pt reports he doesn't like people touching his belongings, otherwise no new complaints or concerns this AM. ? ?Review of Systems  ?Constitutional:  Negative for chills and fever.  ?Respiratory:  Negative for shortness of breath.   ?Gastrointestinal:  Negative for abdominal pain, heartburn and nausea.  ? ? ? ?Objective: ?  ?No results found. ?Recent Labs  ?  09/15/21 ?[id number]  ?WBC 7.8  ?HGB 12.4*  ?HCT 36.3*  ?PLT 298  ? ?Recent Labs  ?  09/15/21 ?[id number]  ?NA 138  ?K 4.1  ?CL 100  ?CO2 28  ?GLUCOSE 108*  ?BUN 13  ?CREATININE 1.29*  ?CALCIUM 9.4  ? ? ?Intake/Output Summary (Last 24 hours) at 09/15/2021 1344 ?Last data filed at 09/15/2021 1243 ?Gross per 24 hour  ?Intake 649 ml  ?Output 1500 ml  ?Net -851 ml  ?  ? ?  ? ?Physical Exam: ?Vital Signs ?Blood pressure 120/75, pulse 67, temperature 98.5 ?F (36.9 ?C), resp. rate 16, height 5\' 9"  (1.753 m), weight 57.9 kg, SpO2 96 %. ? ?General: Alert and oriented x 3, No apparent distress ?HEENT: Head is normocephalic, atraumatic, PERRLA, EOMI, sclera anicteric, oral mucosa pink and moist, tongue exudate. glasses ?Neck: Supple without JVD or lymphadenopathy ?Heart: Reg rate and rhythm. No murmurs rubs or gallops ?Chest: CTA bilaterally without wheezes, rales, or rhonchi; no distress ?Abdomen: Soft, non-tender, non-distended, bowel sounds positive. ?Extremities: No clubbing, cyanosis, or edema. Pulses are 2+ ?Psych: Pt's affect is appropriate. Pt is cooperative ?Skin: Clean and intact without signs of breakdown ?Neuro:  Very dysarthric speech. Answers with thumbs up or down or writing on board. Follows commands.  ?CN 2-12 intact ?Looking in all directions ? ?Musculoskeletal:  ? ?Sensation to LT intact all 4 extremities ?Strength 5/5 in LUE and LLE ?RLE 4+/5 ?RUE Strength 4/5 proximal RUE, 0-1/5 distal ? ?Assessment/Plan: ?1. Functional deficits which require 3+ hours per day of  interdisciplinary therapy in a comprehensive inpatient rehab setting. ?Physiatrist is providing close team supervision and 24 hour management of active medical problems listed below. ?Physiatrist and rehab team continue to assess barriers to discharge/monitor patient progress toward functional and medical goals ? ?Care Tool: ? ?Bathing ?   ?   ?   ?  ?  ?Bathing assist   ?  ?  ?Upper Body Dressing/Undressing ?Upper body dressing   ?What is the patient wearing?: Pull over shirt ?   ?Upper body assist Assist Level: Supervision/Verbal cueing ?   ?Lower Body Dressing/Undressing ?Lower body dressing ? ? ?   ?What is the patient wearing?: Pants ? ?  ? ?Lower body assist Assist for lower body dressing: Minimal Assistance - Patient > 75% ?   ? ?Toileting ?Toileting Toileting Activity did not occur (Probation officer and hygiene only): N/A (no void or bm)  ?Toileting assist Assist for toileting: Minimal Assistance - Patient > 75% ?  ?  ?Transfers ?Chair/bed transfer ? ?Transfers assist ?   ? ?Chair/bed transfer assist level: Supervision/Verbal cueing ?  ?  ?Locomotion ?Ambulation ? ? ?Ambulation assist ? ?   ? ?  ?  ?   ? ?Walk 10 feet activity ? ? ?  Assist ?   ? ?  ?   ? ?Walk 50 feet activity ? ? ?Assist   ? ?  ?   ? ? ?Walk 150 feet activity ? ? ?Assist   ? ?  ?  ?  ? ?Walk 10 feet on uneven surface  ?activity ? ? ?Assist   ? ? ?  ?   ? ?Wheelchair ? ? ? ? ?Assist   ?  ?  ? ?  ?   ? ? ?Wheelchair 50 feet with 2 turns activity ? ? ? ?Assist ? ?  ?  ? ? ?   ? ?Wheelchair 150 feet activity  ? ? ? ?Assist ?   ? ? ?   ? ?Blood pressure 120/75, pulse 67, temperature 98.5 ?F (36.9 ?C), resp. rate 16, height 5\' 9"  (1.753 m), weight 57.9 kg, SpO2 96 %. ?Medical Problem List and Plan: ?1. 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            -patient may shower ?            -ELOS/Goals:  10-12 days Mod I to supervision with PT, OT and SLP ? -PT/OT/SLP evaluations ?2.   Antithrombotics: ?-DVT/anticoagulation:  Pharmaceutical: Lovenox will start 40 mg daily ?            -antiplatelet therapy: aspirin 325 mg daily ?3. Pain Management: Tylenol,  Robaxin PRN ?4. Mood: LCSW to evaluate and provide emotional support ?            -antipsychotic agents: n/a ?5. Neuropsych: This patient is capable of making decisions on his own behalf. ?6. Skin/Wound Care: routine skin care checks ?7. Fluids/Electrolytes/Nutrition: routine Is and Os and follow-up chemistries ?8: Dysphagia: continue SLP, dysphagia 2 diet with thin liquids ?9: Hypertension: no meds; monitor (lisinopril 20 mg at home) ?           -5/11 BP well controlled continue to monitor ?10: Hyperlipidemia: Atorvastatin 20 mg , will start now ?11: Prediabetes: glucose range is 95-115.  ?12: Polysubstance abuse: advised to discontinue use, this increases stroke risk and other health problems ?            --tobacco use ?            --cocaine use ?            --alcohol use ?            --marijuana use ?13: Peptic ulcer disease: continue pantoprazole ?14: Left shoulder rotator cuff tendinitis>>Dr. Erlinda Hong as outpatient ?15: Acute kidney injury ?-creatinine improved from 2.11 to 1.29 ?-encourage PO fluids ?16: Mild leukocytosis, afebrile: monitor; follow-up CBC with diff ?17: Exudate of tongue, likely oral thrush. Will start Nystatin ?  ?  ? ?LOS: ?1 days ?A FACE TO FACE EVALUATION WAS PERFORMED ? ?Jennye Boroughs ?09/15/2021, 1:44 PM  ? ?  ?

## 2021-09-15 NOTE — Progress Notes (Signed)
Orthopedic Tech Progress Note ?Patient Details:  ?Christopher [name] ?Oct 04, 1965 ?[id number] ?Order called into [facility] for resting WHO ?Patient ID: Christopher [name], male   DOB: 11-20-1965, 55 y.o.   MRN: [id number] ? ?Christopher [name] ?09/15/2021, 2:13 PM ? ?

## 2021-09-15 NOTE — Evaluation (Addendum)
Speech Language Pathology Assessment and Plan ? ?Patient Details  ?Name: Christopher [name] ?MRN: [id number] ?Date of Birth: 1965/09/12 ? ?SLP Diagnosis: Dysarthria;Dysphagia  ?Rehab Potential: Good ?ELOS: 5-7 days  ? ?Today's Date: 09/15/2021 ?SLP Individual Time: 0903-1000 ?SLP Individual Time Calculation (min): 57 min ? ?Hospital Problem: Principal Problem: ?  Acute ischemic left MCA stroke ([place]) ? ?Past Medical History:  ?Past Medical History:  ?Diagnosis Date  ? PUD (peptic ulcer disease) 2008  ? treated with meds  ? ?Past Surgical History: History reviewed. No pertinent surgical history. ? ?Assessment / Plan / Recommendation ?Clinical Impression Christopher [name] is a right handed 56 year old male who presented to the emergency department on 09/10/2021 complaining of garbled speech and right arm weakness. CT was negative for acute stroke and CTA was negative for large vessel occlusion. Patient has a history of cocaine use and reported last use was 5 days prior.  He was admitted to the neuro ICU for further management.  Physical exam revealed right facial droop, right arm weakness, right leg weakness, right decreased sensation and dysarthria. Imaging consistent with left MCA and perirolandic infarct likely secondary to atherosclerosis and cocaine use. Possible petechial hemorrhage but no malignant hemorrhagic transformation or mass effect. A right occipital infarct is new since September but appears chronic. Patient is tolerating a dysphagia 2 diet with thin liquids. The patient's past medical history is significant for hypertension on lisinopril, hyperlipidemia and peptic ulcer disease on PPI.  He was evaluated in September 2022 for possible stroke and MRI revealed acute lacunar infarct within the right thalamic capsular junction. Pt underwent speech/swallow evals after admission in Sept for CVA - WFL with no f/u recommended. There was a probable subacute infarct within the subcortical white matter of the right frontal  lobe. Chronic lacunar infarcts noted. Ongoing tobacco use approximately 1/2 pack/day. Endorses current alcohol use. The patient requires inpatient medicine and rehabilitation evaluations and services for ongoing dysfunction secondary to left MCA stroke. ? ?Patient presents with severe dysarthria c/b hypernasality and reduced articulatory precision impacting formation and production of both vowels and consonants at the word level. Speech intelligibility was perceived as 25-50% at the word level and <25% with phrases and sentences. Pt implemented various compensations with mod A through multimodal means for using gestures and written communication on a dry erase board. Written expression appeared intact at the word/phrase/sentence level. Pt reported he has been practicing his speech by talking aloud in room. SLP provided pt word word list for additional practice and provided education + handout on motor speech strategies. Expressive/receptive language appeared [facility]. Cognition appeared grossly intact at the basic level. Pt was oriented x4, demonstrated appropriate problem solving/reasoning skills, and awareness of deficits. Suspect patient may exhibit selective attention deficits d/t increased distractibility to external stimuli, as well as possible decreased recall d/t incomplete recall of word list. Pt has hx of CVA in September 2022 so may have unresolved deficits from previous stroke. Family was not present to provide insight on PLOF. Plan to focus primarily on speech goals at this time due to severity of impairments and impact on functional communication. ? ?Clinical swallow assessment completed revealing overt s/sx of aspiration with thin liquids when chin tuck position was not consistently implemented. Pt required at least min A verbal cues to execute effectively and consistently. Pt had tendency to tuck chin to receive sip from cup, however would elevate his head during the swallow which resulted in this  compensatory strategy being less effective and increasing coughing episodes. Pt  exhibited NO coughing events when chin tuck position was implemented. Of note, chin tuck was found to be effective in minimizing/eliminating aspiration during MBS obtained on 5/08. Pt consumed dys 2 textures with mildly prolonged yet effective mastication, no significant oral residue post swallows, and no overt s/sx of aspiration. SLP recommends continuation of dysphagia 2 diet with thin liquids with strict adherence to swallowing precautions/strategies including chin tuck with all liquids. Full supervision is necessary to ensure consistent adherence to precautions.  ? ?Patient would benefit from skilled SLP intervention to maximize speech and swallow functions and overall functional independence prior to discharge. ?  ?Skilled Therapeutic Interventions          Pt participated in assessments of cognitive-linguistic, speech, and language function. Clinical swallow evaluation completed. Please see above.     ?SLP Assessment ? Patient will need skilled Speech Lanaguage Pathology Services during CIR admission  ?  ?Recommendations ? SLP Diet Recommendations: Thin;Dysphagia 2 (Fine chop) ?Liquid Administration via: Cup;No straw ?Medication Administration: Whole meds with puree ?Supervision: Full supervision/cueing for compensatory strategies ?Compensations: Slow rate;Small sips/bites;Chin tuck;Follow solids with liquid ?Postural Changes and/or Swallow Maneuvers: Seated upright 90 degrees ?Oral Care Recommendations: Oral care BID ?Patient destination: Home ?Follow up Recommendations: 24 hour supervision/assistance;Outpatient SLP ?Equipment Recommended: None recommended by SLP  ?  ?SLP Frequency 3 to 5 out of 7 days   ?SLP Duration ? ?SLP Intensity ? ?SLP Treatment/Interventions 5-7 days ? ?Minumum of 1-2 x/day, 30 to 90 minutes ? ?Speech/Language facilitation;Oral motor exercises;Cognitive remediation/compensation;Dysphagia/aspiration  precaution training;Functional tasks;Patient/family education;Internal/external aids   ? ?Pain ?Pain Assessment ?Pain Scale: 0-10 ?Pain Score: 0-No pain ? ?Prior Functioning ?Type of Home: Mobile home ? Lives With: Family (sister and BIL) ?Available Help at Discharge: Available 24 hours/day;Family ?Vocation: Unemployed ? ?SLP Evaluation ?Cognition ?Overall Cognitive Status: No family/caregiver present to determine baseline cognitive functioning ?Arousal/Alertness: Awake/alert ?Orientation Level: Oriented X4 ?Year: 2023 ?Month: May ?Day of Week: Correct ?Attention: Sustained ?Sustained Attention: Appears intact ?Selective Attention: Impaired ?Selective Attention Impairment: Verbal basic (distracted by external stimuli) ?Memory: Impaired ?Memory Impairment: Storage deficit;Decreased recall of new information ?Awareness: Appears intact ?Problem Solving: Appears intact ?Safety/Judgment: Appears intact  ?Comprehension ?Auditory Comprehension ?Overall Auditory Comprehension: Appears within functional limits for tasks assessed ?Reading Comprehension ?Reading Status: Not tested ?Expression ?Expression ?Primary Mode of Expression: Verbal ?Verbal Expression ?Overall Verbal Expression: Appears within functional limits for tasks assessed ?Automatic Speech: Name;Social Response;Counting;Day of week;Month of year ?Level of Generative/Spontaneous Verbalization: Phrase ?Repetition: Impaired (due to dysarthria) ?Naming: No impairment ?Pragmatics: No impairment ?Non-Verbal Means of Communication: Writing;Gestures ?Written Expression ?Dominant Hand: Left ?Written Expression: Within Functional Limits ?Oral Motor ?Oral Motor/Sensory Function ?Overall Oral Motor/Sensory Function: Mild impairment ?Facial ROM: Reduced right;Suspected CN VII (facial) dysfunction ?Facial Symmetry: Abnormal symmetry right;Suspected CN VII (facial) dysfunction ?Facial Strength: Reduced right ?Facial Sensation: Reduced right ?Lingual ROM: Reduced  right;Suspected CN XII (hypoglossal) dysfunction ?Lingual Symmetry: Within Functional Limits ?Lingual Strength: Reduced;Suspected CN XII (hypoglossal) dysfunction ?Velum: Suspected CN X (Vagus) dysfunction ?Mandible: Within [place]

## 2021-09-15 NOTE — Plan of Care (Signed)
?Problem: RH Balance ?Goal: LTG Patient will maintain dynamic standing with ADLs (OT) ?Description: LTG:  Patient will maintain dynamic standing balance with assist during activities of daily living (OT)  ?Flowsheets (Taken 09/15/2021 1223) ?LTG: Pt will maintain dynamic standing balance during ADLs with: Supervision/Verbal cueing ?  ?Problem: Sit to Stand ?Goal: LTG:  Patient will perform sit to stand in prep for activites of daily living with assistance level (OT) ?Description: LTG:  Patient will perform sit to stand in prep for activites of daily living with assistance level (OT) ?Flowsheets (Taken 09/15/2021 1223) ?LTG: PT will perform sit to stand in prep for activites of daily living with assistance level: Independent with assistive device ?  ?Problem: RH Eating ?Goal: LTG Patient will perform eating w/assist, cues/equip (OT) ?Description: LTG: Patient will perform eating with assist, with/without cues using equipment (OT) ?Flowsheets (Taken 09/15/2021 1223) ?LTG: Pt will perform eating with assistance level of: Independent with assistive device  ?  ?Problem: RH Grooming ?Goal: LTG Patient will perform grooming w/assist,cues/equip (OT) ?Description: LTG: Patient will perform grooming with assist, with/without cues using equipment (OT) ?Flowsheets (Taken 09/15/2021 1223) ?LTG: Pt will perform grooming with assistance level of: Independent with assistive device  ?  ?Problem: RH Bathing ?Goal: LTG Patient will bathe all body parts with assist levels (OT) ?Description: LTG: Patient will bathe all body parts with assist levels (OT) ?Flowsheets (Taken 09/15/2021 1223) ?LTG: Pt will perform bathing with assistance level/cueing: Supervision/Verbal cueing ?  ?Problem: RH Dressing ?Goal: LTG Patient will perform upper body dressing (OT) ?Description: LTG Patient will perform upper body dressing with assist, with/without cues (OT). ?Flowsheets (Taken 09/15/2021 1223) ?LTG: Pt will perform upper body dressing with assistance  level of: Independent with assistive device ?Goal: LTG Patient will perform lower body dressing w/assist (OT) ?Description: LTG: Patient will perform lower body dressing with assist, with/without cues in positioning using equipment (OT) ?Flowsheets (Taken 09/15/2021 1223) ?LTG: Pt will perform lower body dressing with assistance level of: Independent with assistive device ?  ?Problem: RH Toileting ?Goal: LTG Patient will perform toileting task (3/3 steps) with assistance level (OT) ?Description: LTG: Patient will perform toileting task (3/3 steps) with assistance level (OT)  ?Flowsheets (Taken 09/15/2021 1223) ?LTG: Pt will perform toileting task (3/3 steps) with assistance level: Independent with assistive device ?  ?Problem: RH Functional Use of Upper Extremity ?Goal: LTG Patient will use RT/LT upper extremity as a (OT) ?Description: LTG: Patient will use right/left upper extremity as a stabilizer/gross assist/diminished/nondominant/dominant level with assist, with/without cues during functional activity (OT) ?Flowsheets (Taken 09/15/2021 1223) ?LTG: Use of upper extremity in functional activities: RUE as diminished level ?LTG: Pt will use upper extremity in functional activity with assistance level of: Supervision/Verbal cueing ?  ?Problem: RH Simple Meal Prep ?Goal: LTG Patient will perform simple meal prep w/assist (OT) ?Description: LTG: Patient will perform simple meal prep with assistance, with/without cues (OT). ?Flowsheets (Taken 09/15/2021 1223) ?LTG: Pt will perform simple meal prep with assistance level of: Supervision/Verbal cueing ?  ?Problem: RH Toilet Transfers ?Goal: LTG Patient will perform toilet transfers w/assist (OT) ?Description: LTG: Patient will perform toilet transfers with assist, with/without cues using equipment (OT) ?Flowsheets (Taken 09/15/2021 1223) ?LTG: Pt will perform toilet transfers with assistance level of: Independent with assistive device ?  ?Problem: RH Tub/Shower  Transfers ?Goal: LTG Patient will perform tub/shower transfers w/assist (OT) ?Description: LTG: Patient will perform tub/shower transfers with assist, with/without cues using equipment (OT) ?Flowsheets (Taken 09/15/2021 1223) ?LTG: Pt will perform tub/shower stall  transfers with assistance level of: Supervision/Verbal cueing ?  ?Problem: RH Furniture Transfers ?Goal: LTG Patient will perform furniture transfers w/assist (OT/PT) ?Description: LTG: Patient will perform furniture transfers  with assistance (OT/PT). ?Flowsheets (Taken 09/15/2021 1223) ?LTG: Pt will perform furniture transfers with assist:: Independent with assistive device  ?  ?

## 2021-09-15 NOTE — Evaluation (Signed)
Physical Therapy Assessment and Plan ? ?Patient Details  ?Name: Christopher [name] ?MRN: [id number] ?Date of Birth: 03/07/1966 ? ?PT Diagnosis: Hemiplegia non-dominant and Muscle weakness ?Rehab Potential: Excellent ?ELOS: 5-7 days  ? ?Today's Date: 09/15/2021 ?PT Individual Time: 1402-1500 ?PT Individual Time Calculation (min): 58 min   ? ?Hospital Problem: Principal Problem: ?  Acute ischemic left MCA stroke ([facility]) ? ? ?Past Medical History:  ?Past Medical History:  ?Diagnosis Date  ? PUD (peptic ulcer disease) 2008  ? treated with meds  ? ?Past Surgical History: History reviewed. No pertinent surgical history. ? ?Assessment & Plan ?Clinical Impression: Patient is a 56 year old male who presented to the emergency department on 09/10/2021 complaining of garbled speech and right arm weakness.  Code stroke was activated and Dr. Amada Jupiter evaluated.  CT was negative for acute stroke and CTA was negative for large vessel occlusion.  Tenecteplase was given at 11:08 AM.  Patient has a history of cocaine use and reported last use was 5 days prior.  He was admitted to the neuro ICU for further management.  Physical exam revealed right facial droop, right arm weakness, right leg weakness, right decreased sensation and dysarthria.  Imaging consistent with left MCA and perirolandic infarct likely secondary to atherosclerosis and cocaine use.  Possible petechial hemorrhage but no malignant hemorrhagic transformation or mass effect.  A right occipital infarct is new since September but appears chronic.  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  Patient is tolerating a dysphagia 2 diet with thin liquids.  2D echocardiogram with ejection fraction of 60 to 65%.  Hemoglobin A1c 6.2.   ?  ?The patient's past medical history is significant for hypertension on lisinopril, hyperlipidemia and peptic ulcer disease on PPI.  He was evaluated in September 2022 for possible stroke  and MRI revealed acute lacunar infarct within the right thalamic capsular junction.  There was a probable subacute infarct within the subcortical white matter of the right frontal lobe.  Chronic lacunar infarcts noted. He was admitted to inpatient rehab last year.  ?  ?  ?His urine drug screen was positive for cocaine and tetrahydrocannabinol.  Ongoing tobacco use approximately 1/2 pack/day.  Endorses current alcohol use. ?  ?Today he denies any difficulty with pain. He often answers by writing or using thumbs up or thumbs down with his left hand. ?  ?The patient requires inpatient medicine and rehabilitation evaluations and services for ongoing dysfunction secondary to left MCA stroke. Patient transferred to CIR on 09/14/2021 .  ? ?Patient currently requires supervision with mobility secondary to muscle weakness, muscle joint tightness, and muscle paralysis, abnormal tone, and decreased standing balance and hemiplegia.  Prior to hospitalization, patient was modified independent  with mobility and lived with Family (sister and BIL) in a Mobile home home.  Home access is 2-3Stairs to enter. ? ?Patient will benefit from skilled PT intervention to maximize safe functional mobility, minimize fall risk, and decrease caregiver burden for planned discharge home with intermittent assist.  Anticipate patient will benefit from follow up OP at discharge. ? ?PT - End of Session ?Activity Tolerance: Tolerates 30+ min activity with multiple rests ?Endurance Deficit: Yes ?PT Assessment ?Rehab Potential (ACUTE/IP ONLY): Excellent ?PT Barriers to Discharge: Insurance for SNF coverage;Home environment access/layout;Medication compliance ?PT Patient demonstrates impairments in the following area(s): Balance;Endurance;Motor ?PT Transfers Functional Problem(s): Bed Mobility;Bed to Chair;Car;Furniture;Floor ?PT Locomotion Functional Problem(s): Ambulation;Wheelchair Mobility;Stairs ?PT Plan ?PT Intensity: Minimum of 1-2 x/day ,45  to 90  minutes ?PT Frequency: 5 out of 7 days ?PT Duration Estimated Length of Stay: 5-7 days ?PT Treatment/Interventions: Ambulation/gait training;Discharge planning;Functional mobility training;Visual/perceptual remediation/compensation;Therapeutic Activities;Psychosocial support;Balance/vestibular training;Disease management/prevention;Neuromuscular re-education;Therapeutic Exercise;Wheelchair propulsion/positioning;Cognitive remediation/compensation;DME/adaptive equipment instruction;Splinting/orthotics;UE/LE Strength taining/ROM;Community reintegration;Functional electrical stimulation;Patient/family education;Stair training;UE/LE Coordination activities;Pain management ?PT Transfers Anticipated Outcome(s): indep no AD ?PT Locomotion Anticipated Outcome(s): Modified indep. ambulatory ?PT Recommendation ?Recommendations for Other Services: Therapeutic Recreation consult ?Therapeutic Recreation Interventions: Outing/community reintergration ?Follow Up Recommendations: Outpatient PT ?Patient destination: Home ?Equipment Recommended: To be determined ? ? ?PT Evaluation ?Precautions/Restrictions ?Precautions ?Precautions: Fall ?Precaution Comments: dysarthria, RUE hemiparesis ?Restrictions ?Weight Bearing Restrictions: No ?General ?  Vital SignsTherapy Vitals ?Temp: 98.8 ?F (37.1 ?C) ?Pulse Rate: 79 ?Resp: 15 ?BP: 129/71 ?Patient Position (if appropriate): Lying ?Oxygen Therapy ?SpO2: 96 % ?O2 Device: Room Air ?Pain ?Pain Assessment ?Pain Scale: 0-10 ?Pain Score: 0-No pain ?Pain Interference ?Pain Interference ?Pain Effect on Sleep: 1. Rarely or not at all (answer through head nods) ?Pain Interference with Therapy Activities: 2. Occasionally ?Pain Interference with Day-to-Day Activities: 1. Rarely or not at all ?Home Living/Prior Functioning ?Home Living ?Living Arrangements: Other relatives ?Available Help at Discharge: Available 24 hours/day;Family ?Type of Home: Mobile home ?Home Access: Stairs to enter ?Entrance  Stairs-Number of Steps: 2-3 ?Entrance Stairs-Rails: Right (per pt) ?Home Layout: One level ?Bathroom Shower/Tub: Walk-in shower ?Bathroom Toilet: Standard ?Bathroom Accessibility: Yes ? Lives With: Family (sister and BIL) ?Prior Function ?Level of Independence: Independent with basic ADLs (using SPC post previous CVA, but reports not using device now) ?Driving: No ?Vocation: Unemployed ?Vision/Perception  ?Vision - History ?Ability to See in Adequate Light: 0 Adequate ?Vision - Assessment ?Eye Alignment: Within Functional Limits ?Ocular Range of Motion: Within Functional Limits ?Alignment/Gaze Preference: Within Defined Limits ?Tracking/Visual Pursuits: Decreased smoothness of eye movement to LEFT superior field;Decreased smoothness of eye movement to LEFT inferior field ?Saccades: Decreased speed of saccadic movement ?Perception ?Perception: Within Functional Limits ?Praxis ?Praxis: Intact  ?Cognition ?Overall Cognitive Status: Within Functional Limits for tasks assessed ?Arousal/Alertness: Awake/alert ?Orientation Level: Oriented X4 ?Year: 2023 ?Month: May ?Day of Week: Correct ?Attention: Sustained ?Sustained Attention: Appears intact ?Selective Attention: Impaired ?Selective Attention Impairment: Verbal basic (distracted by external stimuli) ?Memory: Impaired ?Memory Impairment: Storage deficit;Decreased recall of new information ?Awareness: Appears intact ?Problem Solving: Appears intact ?Safety/Judgment: Appears intact ?Comments: Pt demonstrates good safety awareness and insight into deficits in busy gym environement. ?Sensation ?Sensation ?Light Touch: Appears Intact ?Hot/Cold: Appears Intact ?Proprioception: Appears Intact ?Stereognosis: Impaired Detail ?Stereognosis Impaired Details: Impaired RUE ?Additional Comments: endorses numbness R forearm > R digits and in R toes, but able to appreciate LT to digits ?Coordination ?Gross Motor Movements are Fluid and Coordinated: No ?Fine Motor Movements are Fluid and  Coordinated: No ?Coordination and Movement Description: R hemiparesis RUE significantly worse than RLE ?Finger Nose Finger Test: unable to isolate R index finget to complete, mildly ataxic ?Heel Shin Test: [facility]

## 2021-09-15 NOTE — Progress Notes (Signed)
Inpatient Rehabilitation  Patient information reviewed and entered into eRehab system by Jerrick Farve Deshawn Skelley, OTR/L.   Information including medical coding, functional ability and quality indicators will be reviewed and updated through discharge.    

## 2021-09-15 NOTE — Plan of Care (Signed)
?  Problem: RH Balance ?Goal: LTG Patient will maintain dynamic standing balance (PT) ?Description: LTG:  Patient will maintain dynamic standing balance with assistance during mobility activities (PT) ?Flowsheets (Taken 09/15/2021 1523) ?LTG: Pt will maintain dynamic standing balance during mobility activities with:: Independent with assistive device  ?  ?Problem: RH Bed Mobility ?Goal: LTG Patient will perform bed mobility with assist (PT) ?Description: LTG: Patient will perform bed mobility with assistance, with/without cues (PT). ?Flowsheets (Taken 09/15/2021 1523) ?LTG: Pt will perform bed mobility with assistance level of: Independent ?  ?Problem: RH Car Transfers ?Goal: LTG Patient will perform car transfers with assist (PT) ?Description: LTG: Patient will perform car transfers with assistance (PT). ?Flowsheets (Taken 09/15/2021 1523) ?LTG: Pt will perform car transfers with assist:: Independent with assistive device  ?  ?Problem: RH Furniture Transfers ?Goal: LTG Patient will perform furniture transfers w/assist (OT/PT) ?Description: LTG: Patient will perform furniture transfers  with assistance (OT/PT). ?Flowsheets (Taken 09/15/2021 1523) ?LTG: Pt will perform furniture transfers with assist:: Independent with assistive device  ?  ?Problem: RH Floor Transfers ?Goal: LTG Patient will perform floor transfers w/assist (PT) ?Description: LTG: Patient will perform floor transfers with assistance (PT). ?Flowsheets (Taken 09/15/2021 1523) ?LTG: PT WILL PERFORM FLOOR TRANFERS  WITH  ASSIST:: Supervision/Verbal cueing ?  ?Problem: RH Ambulation ?Goal: LTG Patient will ambulate in controlled environment (PT) ?Description: LTG: Patient will ambulate in a controlled environment, # of feet with assistance (PT). ?Flowsheets (Taken 09/15/2021 1523) ?LTG: Pt will ambulate in controlled environ  assist needed:: Independent with assistive device ?LTG: Ambulation distance in controlled environment: 327ft ?Goal: LTG Patient will  ambulate in home environment (PT) ?Description: LTG: Patient will ambulate in home environment, # of feet with assistance (PT). ?Flowsheets (Taken 09/15/2021 1523) ?LTG: Pt will ambulate in home environ  assist needed:: Independent with assistive device ?LTG: Ambulation distance in home environment: 120ft ?Goal: LTG Patient will ambulate in community environment (PT) ?Description: LTG: Patient will ambulate in community environment, # of feet with assistance (PT). ?Flowsheets (Taken 09/15/2021 1523) ?LTG: Pt will ambulate in community environ  assist needed:: Independent with assistive device ?LTG: Ambulation distance in community environment: 349ft ?  ?Problem: RH Stairs ?Goal: LTG Patient will ambulate up and down stairs w/assist (PT) ?Description: LTG: Patient will ambulate up and down # of stairs with assistance (PT) ?Flowsheets (Taken 09/15/2021 1523) ?LTG: Pt will ambulate up/down stairs assist needed:: Independent with assistive device ?LTG: Pt will  ambulate up and down number of stairs: 3 steps with 1 rail to access home ?  ?Problem: RH Pre-functional/Other (Specify) ?Goal: RH LTG PT (Specify) 1 ?Description:  RH LTG PT (Specify) 1 ?Flowsheets (Taken 09/15/2021 1523) ?LTG: Other PT (Specify) 1: pt will increase FGA >22 to indicate reduced fall risk ?  ?

## 2021-09-16 NOTE — Progress Notes (Signed)
Physical Therapy Session Note ? ?Patient Details  ?Name: Christopher [name] ?MRN: [id number] ?Date of Birth: 07-23-1965 ? ?Today's Date: 09/16/2021 ?PT Individual Time: 1007-1101 ?PT Individual Time Calculation (min): 54 min  ? ?Short Term Goals: ?Week 1:  PT Short Term Goal 1 (Week 1): STG=LTG due to ELOS ? ? ?Skilled Therapeutic Interventions/Progress Updates:  ? ?Pt received supine in bed and agreeable to PT. Supine>sit transfer without assist or cues.  ? ?Pt performed all sit<>stand transfers with distant supervision assist from PT with cues for attention the RUE for safety when sitting in chair.  ? ?Pt able to don shoes with supervision assist sitting EOB; PT required to tie due to RUE weakness.  ? ?Dynamic balance training standing on airex pad to complete BIT visual scanning user paced x 2 with mod assist for wrist extension on the R UE. Trail making A with RUE, assist for wrist extension, 5 interruptions, 0 mistakes. Trail making B LUE 21 interruptions and 5 mistakes. No LOB throughout.  ? ?NUstep with RUE hand splint to force use of affected UE x 8 min, with cues for grasp on the R side to sustain finger/wrist activation.  ? ?Dynamic balance training to perform foot tap on 6inch cone with forward gait progression 2 x 6 Bil. Side stepping through cones to weave forward and reverse x 2 bout 8 cones.  ?Forced use of BUE to preform shoulder flexion with light therapy ball and chest press in standing x 10 each with assist from PT for RUE finger position.  ? ?Pt returned to room and performed ambulatory transfer to bed with supervision assist  for safety. Sit>supine completed without assist, and left supine in bed with call bell in reach and all needs met.  ?  ? ?   ? ?Therapy Documentation ?Precautions:  ?Precautions ?Precautions: Fall ?Precaution Comments: dysarthria, RUE hemiparesis ?Restrictions ?Weight Bearing Restrictions: No ? ?Pain: ?Pain Assessment ?Pain Scale: 0-10 ?Pain Score: 0-No pain ? ? ?Therapy/Group:  Individual Therapy ? ?Lorie Phenix ?09/16/2021, 11:02 AM  ?

## 2021-09-16 NOTE — Progress Notes (Signed)
Slept well last night. No complains of pain or any discomfort. Uses whiteboard for effective communication. VS stable. Remains continent of Bowel and Bladder. Call bell within patient reach. Safety maintained. ?

## 2021-09-16 NOTE — Progress Notes (Signed)
Orthopedic Tech Progress Note ?Patient Details:  ?[name] ?1965-09-20 ?[id number] ? ?Patient ID: [name], male   DOB: 1966/04/24, 56 y.o.   MRN: [id number] ? ?Saul Fordyce ?09/16/2021, 8:47 AMCalled [facility] for right resting hand splint . ? ?

## 2021-09-16 NOTE — Progress Notes (Signed)
Physical Therapy Session Note ? ?Patient Details  ?Name: Christopher [name] ?MRN: [id number] ?Date of Birth: 02-Aug-1965 ? ?Today's Date: 09/16/2021 ?PT Individual Time: 8657-8469 ?PT Individual Time Calculation (min): 28 min  ? ?Short Term Goals: ?Week 1:  PT Short Term Goal 1 (Week 1): STG=LTG due to ELOS ? ? ?Skilled Therapeutic Interventions/Progress Updates:  ? ?Pt received supine in bed and agreeable to PT. Supine>sit transfer without assist or cues from PT. Pt donning shoes sitting EOB. PT required to tie Bil shoes due to RUE hemiplegia.  ? ?Gait training in hall 2 x 287f with supervision assist and cues for safety and direction through semifamiliar environment.  ? ?RUE NMR seated to perform forward reach with RUE supported on therapy ball x 1 min. Drawing alphbet with RUE on ball with max assist to stabilize hand on ball.  ? ?Qped to performed forward press with BUE on large therapy ball x 12. Qped BLE on mat to perform reciprocal forward reach x 10 bil with Mod assist for improved wrist extension on the R side. .  ? ?Pt returned to room and performed ambulatory transfer to bed with supervision assist for safety. Sit>supine completed without assist, and left supine in bed with call bell in reach and all needs met.  ? ? ?   ? ?Therapy Documentation ?Precautions:  ?Precautions ?Precautions: Fall ?Precaution Comments: dysarthria, RUE hemiparesis ?Restrictions ?Weight Bearing Restrictions: No ? ?Pain: ? denies ? ? ?Therapy/Group: Individual Therapy ? ?ALorie Phenix?09/16/2021, 1:43 PM  ?

## 2021-09-16 NOTE — Progress Notes (Signed)
Occupational Therapy Session Note ? ?Patient Details  ?Name: Christopher [name] ?MRN: [id number] ?Date of Birth: 02-09-1966 ? ?Today's Date: 09/16/2021 ?OT Individual Time: 4327-6147 ?OT Individual Time Calculation (min): 73 min  ? ? ?Short Term Goals: ?Week 1:  OT Short Term Goal 1 (Week 1): STG = LTG 2/2 ELOS ? ?Skilled Therapeutic Interventions/Progress Updates:  ?  Pt received asleep in bed, eaisly awakened to voice and, agreeable to therapy. Session focus on self-care retraining, activity tolerance, RUE NMR, one handed techinques in prep for improved ADL/IADL/func mobility performance + decreased caregiver burden. Denies pain. ? ?Ind for bed mobility. Agreeable to shower this date. Ambulated throughout session with close S to CGA when engaged in doffing clothing/bathing in standing at shower level and no AD. Able to select clothing from low drawers with close S for balance. ? ?Required min A to bathe LUE/back in shower. May bennefit from wash cloth mitt for R hand in future sessions. Also required assist to apply deodorant under L arm. Set-up A to don shirt and close S to don pants. Demonstrated one handed technique to don B socks and pt able to complete with set-up A.  ? ?Self-fed breakfast with assist to open items, close S to adhere to swallowing precautions per SLP sheet. No episodes of coughing.   ? ?Finally, completed 1x10 of the following with 2 lb dowel rod and use of ace wrap to R grip: forward/backward circles, B shoulder flexion, and chest press. ? ?Discussed one handed techniques including use of dycem to open items and use of wash mitt. ? ?Pt left semi-recline din bed with RN present with bed alarm engaged, call bell in reach, and all immediate needs met.  ? ? ?Therapy Documentation ?Precautions:  ?Precautions ?Precautions: Fall ?Precaution Comments: dysarthria, RUE hemiparesis ?Restrictions ?Weight Bearing Restrictions: No ? ?Pain: denies ?  ?ADL: See Care Tool for more details. ? ?Therapy/Group:  Individual Therapy ? ?Volanda Napoleon MS, OTR/L ? ?09/16/2021, 6:53 AM ?

## 2021-09-16 NOTE — Progress Notes (Signed)
?                                                       PROGRESS NOTE ? ? ?Subjective/Complaints: ?No new complaints ?Working with SLP ?Reviewed progress with SLP with therapist and patient ? ?Review of Systems  ?Constitutional:  Negative for chills and fever.  ?Respiratory:  Negative for shortness of breath.   ?Gastrointestinal:  Negative for abdominal pain, heartburn and nausea.  Denies pain ? ? ? ?Objective: ?  ?No results found. ?Recent Labs  ?  09/15/21 ?[id number]  ?WBC 7.8  ?HGB 12.4*  ?HCT 36.3*  ?PLT 298  ? ? ?Recent Labs  ?  09/15/21 ?[id number]  ?NA 138  ?K 4.1  ?CL 100  ?CO2 28  ?GLUCOSE 108*  ?BUN 13  ?CREATININE 1.29*  ?CALCIUM 9.4  ? ? ? ?Intake/Output Summary (Last 24 hours) at 09/16/2021 1128 ?Last data filed at 09/16/2021 0800 ?Gross per 24 hour  ?Intake 415 ml  ?Output 1100 ml  ?Net -685 ml  ? ?  ? ?  ? ?Physical Exam: ?Vital Signs ?Blood pressure 129/76, pulse 74, temperature 98.5 ?F (36.9 ?C), resp. rate 14, height 5\' 9"  (1.753 m), weight 57.9 kg, SpO2 97 %. ? ?Gen: no distress, normal appearing ?HEENT: oral mucosa pink and moist, NCAT ?Cardio: Reg rate ?Chest: normal effort, normal rate of breathing ?Abdomen: Soft, non-tender, non-distended, bowel sounds positive. ?Extremities: No clubbing, cyanosis, or edema. Pulses are 2+ ?Psych: Pt's affect is appropriate. Pt is cooperative ?Skin: Clean and intact without signs of breakdown ?Neuro:  Very dysarthric speech. Answers with thumbs up or down or writing on board. Follows commands.  ?CN 2-12 intact ?Looking in all directions ? ?Musculoskeletal:  ? ?Sensation to LT intact all 4 extremities ?Strength 5/5 in LUE and LLE ?RLE 4+/5 ?RUE Strength 4/5 proximal RUE, 0-1/5 distal ? ?Assessment/Plan: ?1. Functional deficits which require 3+ hours per day of interdisciplinary therapy in a comprehensive inpatient rehab setting. ?Physiatrist is providing close team supervision and 24 hour management of active medical problems listed below. ?Physiatrist and rehab team  continue to assess barriers to discharge/monitor patient progress toward functional and medical goals ? ?Care Tool: ? ?Bathing ?   ?Body parts bathed by patient: Right arm, Chest, Abdomen, Front perineal area, Buttocks, Right upper leg, Left upper leg, Right lower leg, Face, Left lower leg  ? Body parts bathed by helper: Left arm ?  ?  ?Bathing assist Assist Level: Minimal Assistance - Patient > 75% ?  ?  ?Upper Body Dressing/Undressing ?Upper body dressing   ?What is the patient wearing?: Pull over shirt ?   ?Upper body assist Assist Level: Set up assist ?   ?Lower Body Dressing/Undressing ?Lower body dressing ? ? ?   ?What is the patient wearing?: Pants ? ?  ? ?Lower body assist Assist for lower body dressing: Supervision/Verbal cueing ?   ? ?Toileting ?Toileting Toileting Activity did not occur (Probation officer and hygiene only): N/A (no void or bm)  ?Toileting assist Assist for toileting: Supervision/Verbal cueing ?  ?  ?Transfers ?Chair/bed transfer ? ?Transfers assist ?   ? ?Chair/bed transfer assist level: Supervision/Verbal cueing ?  ?  ?Locomotion ?Ambulation ? ? ?Ambulation assist ? ?   ? ?Assist level: Supervision/Verbal cueing ?Assistive device: No Device ?Max distance: 1200  ? ?Walk  10 feet activity ? ? ?Assist ?   ? ?Assist level: Supervision/Verbal cueing ?Assistive device: No Device  ? ?Walk 50 feet activity ? ? ?Assist   ? ?Assist level: Supervision/Verbal cueing ?Assistive device: No Device  ? ? ?Walk 150 feet activity ? ? ?Assist   ? ?Assist level: Supervision/Verbal cueing ?Assistive device: No Device ?  ? ?Walk 10 feet on uneven surface  ?activity ? ? ?Assist   ? ? ?Assist level: Supervision/Verbal cueing ?   ? ?Wheelchair ? ? ? ? ?Assist Is the patient using a wheelchair?: No ?  ?Wheelchair activity did not occur: N/A ? ?  ?   ? ? ?Wheelchair 50 feet with 2 turns activity ? ? ? ?Assist ? ?  ?Wheelchair 50 feet with 2 turns activity did not occur: N/A ? ? ?   ? ?Wheelchair 150 feet activity   ? ? ? ?Assist ? Wheelchair 150 feet activity did not occur: N/A ? ? ?   ? ?Blood pressure 129/76, pulse 74, temperature 98.5 ?F (36.9 ?C), resp. rate 14, height 5\' 9"  (1.753 m), weight 57.9 kg, SpO2 97 %. ?Medical Problem List and Plan: ?1. 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            -patient may shower ?            -ELOS/Goals:  10-12 days Mod I to supervision with PT, OT and SLP ? -PT/OT/SLP evaluations ? Continue CIR ?2.  Impaired mobility: continue Lovenox will start 40 mg daily ?            -antiplatelet therapy: aspirin 325 mg daily ?3. Pain Management: Tylenol,  Robaxin PRN ?4. Mood: LCSW to evaluate and provide emotional support ?            -antipsychotic agents: n/a ?5. Neuropsych: This patient is capable of making decisions on his own behalf. ?6. Skin/Wound Care: routine skin care checks ?7. Fluids/Electrolytes/Nutrition: routine Is and Os and follow-up chemistries ?8: Dysphagia: continue SLP, dysphagia 2 diet with thin liquids ?9: Hypertension: no meds; monitor (lisinopril 20 mg at home) ?           -5/12 BP well controlled continue to monitor ?10: Hyperlipidemia: continue Atorvastatin 20 mg ?11: Prediabetes: glucose range is 95-115. Does not need CBG checks.  ?12: Polysubstance abuse: advised to discontinue use, this increases stroke risk and other health problems ?            --tobacco use ?            --cocaine use ?            --alcohol use ?            --marijuana use ?13: Peptic ulcer disease: continue pantoprazole ?14: Left shoulder rotator cuff tendinitis>>Dr. Erlinda Hong as outpatient ?15: Acute kidney injury ?-creatinine improved from 2.11 to 1.29 ?-encourage PO fluids ?16: Mild leukocytosis, afebrile: monitor; follow-up CBC with diff ?17: Exudate of tongue, likely oral thrush. Continue Nystatin ?  ?  ? ?LOS: ?2 days ?A FACE TO FACE EVALUATION WAS PERFORMED ? ?Christopher [name] ?09/16/2021, 11:28 AM  ? ?  ?

## 2021-09-16 NOTE — Progress Notes (Signed)
Speech Language Pathology Daily Session Note ? ?Patient Details  ?Name: Christopher [name] ?MRN: [id number] ?Date of Birth: November 11, 1965 ? ?Today's Date: 09/16/2021 ?SLP Individual Time: 0903-1000 ?SLP Individual Time Calculation (min): 57 min ? ?Short Term Goals: ?Week 1: SLP Short Term Goal 1 (Week 1): STG=LTG due to ELOS ? ?Skilled Therapeutic Interventions: Skilled ST treatment focused on speech and swallow goals. Pt consumed cup sips of thin liquids x5 without overt s/sx of aspiration. Pt executed chin tuck effectively during 70% of occasions with sup A. Prior to correction cues, pt tucked chin while taking a sip but elevated head into neutral position before/during the swallow which resulted in chin tuck being less effective. Pt required cueing and correction for this yesterday. Following instruction and further education, pt implemented successfully during  all subsequent attempts with mod I. It should be noted however that even without effective chin tuck position, pt did not exhibit s/sx of aspiration with all liquid consumption. Pt also consistently repositioned himself prior to consumption of liquids at independent level. SLP recommends continuation of current diet. Continue full supervision during meals, however allow pt to consume liquids at bedside without direct supervision. ? ?SLP provided max A fading to min A cues to implement speech intelligibility strategies at the word level with emphasis on one and two syllable words. At the word level,  pt was perceived as intelliglibe during ~21% of occasions (7/33 words) with significant difficulty producing voiced and voiceless stops phonemes (e.g., /p/, /b/, /d/, /t/) and velar consonants (/k/, /g/) due to significant nasal emission and hypernasality. Speech production improved with max A fading to min A verbal and modeling/intoning cues to separate syllables. Intelligibility improved slightly at the phrase level due to increased context. Pt was perceived as  ~30-40% intelligible in known contexts with mod A to over-articulate and separate words; <10-20% in unknown contexts with max A cues. Pt utilized written communication on dry erase board to provide clarification during approximately 25% of occasions. Pt communicated needs effectively with mod A through multimodal communication with verbal responses, written communication, and gestures which were very effective. Patient was left in bed with alarm activated and immediate needs within reach at end of session. Continue per current plan of care.   ?   ?Pain ?Pain Assessment ?Pain Scale: 0-10 ?Pain Score: 0-No pain ? ?Therapy/Group: Individual Therapy ? ?Isaack Preble T Ellyanna Holton ?09/16/2021, 9:39 AM ?

## 2021-09-17 NOTE — Progress Notes (Signed)
Physical Therapy Session Note ? ?Patient Details  ?Name: Christopher [name] ?MRN: [id number] ?Date of Birth: [birth year] ? ?Today's Date: 09/17/2021 ?PT Individual Time: 1006-1100 ?PT Individual Time Calculation (min): 54 min  ? ?Short Term Goals: ?Week 1:  PT Short Term Goal 1 (Week 1): STG=LTG due to ELOS ? ?Skilled Therapeutic Interventions/Progress Updates:  ? ?Pt received supine in bed and agreeable to PT. Supine>sit transfer with out assist or cues. Pt donned shoes EOB with set up from PT. PT ties shoes due to RUE weakness.  ? ?Gait training in hall with distant supervision assist- mod I x 172f to rehab gym. Additional gait training through hall of hospital, atrium, and over cement [facility] to and from entrance 1038f [id number] [id number] cues only for awareness of obstacles on the R. Stair management training to ascend and descend 4 outdoor steps without UE support with step to pattern self selected by PT.  ? ?Biodex balance:  ?Weight shift lateral and A/P x 1 min each ~75% success on medium difficulty.  ?LOS: low difficulty: 42 sec, 39% medium: 49 sec 43%. High difficulty: 46 sec and 48%.  ?Maze: low 30 sec and 85%. medium: 1st bout 57 sec and 54%. 2nd bout 1:06mi61md 42%  ? Catch game randorm: static 85% 2 sec reaction x 2 min and mobile platform level 12 x 2 min 75%, 3 sec reaction.  ? ?Min assist throughout from PT on biodex to improve use of ankle ove hip strategy to COM control as well tactile cues for WB through RUE on support bar to force use of RUE.  ? ?Pt returned to room and performed ambulatory transfer to bed with supervision assist. Sit>supine completed without assist, and left supine in bed with call bell in reach and all needs met.  ?  ? ?  ?   ? ?Therapy Documentation ?Precautions:  ?Precautions ?Precautions: Fall ?Precaution Comments: dysarthria, RUE hemiparesis ?Restrictions ?Weight Bearing Restrictions: No ? ?  ?Pain: ?Pain Assessment ?Pain Scale:  0-10 ?Pain Score: 0-No pain ? ? ? ?Therapy/Group: Individual Therapy ? ?AustiLorie Phenix3/2023, 11:08 AM  ?

## 2021-09-17 NOTE — Progress Notes (Signed)
?                                                       PROGRESS NOTE ? ? ?Subjective/Complaints: ?No new complaints. He wants to go home soon. No other concerns.  ? ?Review of Systems  ?Constitutional:  Negative for chills and fever.  ?Respiratory:  Negative for shortness of breath.   ?Cardiovascular:  Negative for chest pain.  ?Gastrointestinal:  Negative for abdominal pain, heartburn and nausea.  ?Musculoskeletal:  Negative for neck pain.  Denies pain ? ? ? ?Objective: ?  ?No results found. ?Recent Labs  ?  09/15/21 ?0630  ?WBC 7.8  ?HGB 12.4*  ?HCT 36.3*  ?PLT 298  ? ? ?Recent Labs  ?  09/15/21 ?1601  ?NA 138  ?K 4.1  ?CL 100  ?CO2 28  ?GLUCOSE 108*  ?BUN 13  ?CREATININE 1.29*  ?CALCIUM 9.4  ? ? ? ?Intake/Output Summary (Last 24 hours) at 09/17/2021 1400 ?Last data filed at 09/17/2021 0516 ?Gross per 24 hour  ?Intake 180 ml  ?Output 1000 ml  ?Net -820 ml  ? ?  ? ?  ? ?Physical Exam: ?Vital Signs ?Blood pressure 107/62, pulse 79, temperature 98.6 ?F (37 ?C), temperature source Oral, resp. rate 16, height 5\' 9"  (1.753 m), weight 57.9 kg, SpO2 96 %. ? ?Gen: no distress, normal appearing ?HEENT: oral mucosa pink and moist, NCAT ?Cardio: Reg rate ?Chest: normal effort, normal rate of breathing ?Abdomen: Soft, non-tender, non-distended, bowel sounds positive. ?Extremities: No clubbing, cyanosis, or edema.  ?Psych: Pt's affect is appropriate. Pt is cooperative ?Skin: Clean and intact without signs of breakdown ?Neuro:  Very dysarthric speech but a little improved, Answers with thumbs up or down or writing on board. Follows commands.  ?CN 2-12 intact ?Looking in all directions ? ?Musculoskeletal:  ? ?Sensation to LT intact all 4 extremities ?Strength 5/5 in LUE and LLE ?RLE 4+/5 ?RUE Strength 4/5 proximal RUE, 0-1/5 distal ? ?No joint swelling noted ? ?Assessment/Plan: ?1. Functional deficits which require 3+ hours per day of interdisciplinary therapy in a comprehensive inpatient rehab setting. ?Physiatrist is providing  close team supervision and 24 hour management of active medical problems listed below. ?Physiatrist and rehab team continue to assess barriers to discharge/monitor patient progress toward functional and medical goals ? ?Care Tool: ? ?Bathing ?   ?Body parts bathed by patient: Right arm, Chest, Abdomen, Front perineal area, Buttocks, Right upper leg, Left upper leg, Right lower leg, Face, Left lower leg  ? Body parts bathed by helper: Left arm ?  ?  ?Bathing assist Assist Level: Minimal Assistance - Patient > 75% ?  ?  ?Upper Body Dressing/Undressing ?Upper body dressing   ?What is the patient wearing?: Pull over shirt ?   ?Upper body assist Assist Level: Set up assist ?   ?Lower Body Dressing/Undressing ?Lower body dressing ? ? ?   ?What is the patient wearing?: Pants ? ?  ? ?Lower body assist Assist for lower body dressing: Supervision/Verbal cueing ?   ? ?Toileting ?Toileting Toileting Activity did not occur ( and hygiene only): N/A (no void or bm)  ?Toileting assist Assist for toileting: Supervision/Verbal cueing ?  ?  ?Transfers ?Chair/bed transfer ? ?Transfers assist ?   ? ?Chair/bed transfer assist level: Supervision/Verbal cueing ?  ?  ?Locomotion ?Ambulation ? ? ?  Ambulation assist ? ?   ? ?Assist level: Supervision/Verbal cueing ?Assistive device: No Device ?Max distance: 1200  ? ?Walk 10 feet activity ? ? ?Assist ?   ? ?Assist level: Supervision/Verbal cueing ?Assistive device: No Device  ? ?Walk 50 feet activity ? ? ?Assist   ? ?Assist level: Supervision/Verbal cueing ?Assistive device: No Device  ? ? ?Walk 150 feet activity ? ? ?Assist   ? ?Assist level: Supervision/Verbal cueing ?Assistive device: No Device ?  ? ?Walk 10 feet on uneven surface  ?activity ? ? ?Assist   ? ? ?Assist level: Supervision/Verbal cueing ?   ? ?Wheelchair ? ? ? ? ?Assist Is the patient using a wheelchair?: No ?  ?Wheelchair activity did not occur: N/A ? ?  ?   ? ? ?Wheelchair 50 feet with 2 turns  activity ? ? ? ?Assist ? ?  ?Wheelchair 50 feet with 2 turns activity did not occur: N/A ? ? ?   ? ?Wheelchair 150 feet activity  ? ? ? ?Assist ? Wheelchair 150 feet activity did not occur: N/A ? ? ?   ? ?Blood pressure 107/62, pulse 79, temperature 98.6 ?F (37 ?C), temperature source Oral, resp. rate 16, height 5\' 9"  (1.753 m), weight 57.9 kg, SpO2 96 %. ?Medical Problem List and Plan: ?1. 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            -patient may shower ?            -ELOS/Goals:  10-12 days Mod I to supervision with PT, OT and SLP ? -PT/OT/SLP evaluations ? Continue CIR ?2.  Impaired mobility: continue Lovenox will start 40 mg daily ?            -antiplatelet therapy: aspirin 325 mg daily ?3. Pain Management: Tylenol,  Robaxin PRN ?4. Mood: LCSW to evaluate and provide emotional support ?            -antipsychotic agents: n/a ?5. Neuropsych: This patient is capable of making decisions on his own behalf. ?6. Skin/Wound Care: routine skin care checks ?7. Fluids/Electrolytes/Nutrition: routine Is and Os and follow-up chemistries ?8: Dysphagia: continue SLP, continues on dysphagia 2 diet with thin liquids ?9: Hypertension: no meds; monitor (lisinopril 20 mg at home) ?           -5/13 BP continues to be well controlled, continue monitoring ?10: Hyperlipidemia: continue Atorvastatin 20 mg ?11: Prediabetes: glucose range is 95-115. Does not need CBG checks.  ?12: Polysubstance abuse: counseled on health risks and advised not to use ?            --tobacco use ?            --cocaine use ?            --alcohol use ?            --marijuana use ?13: Peptic ulcer disease: continue pantoprazole ?14: Left shoulder rotator cuff tendinitis>>Dr. [id number] as outpatient ?15: Acute kidney injury ?-creatinine improved from 2.11 to 1.29 ?-encourage PO fluids ?-Labs Monday ?16: Mild leukocytosis, afebrile: monitor; follow-up CBC with diff ?17: Exudate of tongue, likely oral  thrush. Continue Nystatin ?  ?  ? ?LOS: ?3 days ?A FACE TO FACE EVALUATION WAS PERFORMED ? ?Monday ?09/17/2021, 2:00 PM  ? ?  ?

## 2021-09-17 NOTE — Progress Notes (Signed)
Speech Language Pathology Daily Session Note ? ?Patient Details  ?Name: Christopher [name] ?MRN: [id number] ?Date of Birth: 06/15/1965 ? ?Today's Date: 09/17/2021 ?SLP Individual Time: 1205-1300 ?SLP Individual Time Calculation (min): 55 min ? ?Short Term Goals: ?Week 1: SLP Short Term Goal 1 (Week 1): STG=LTG due to ELOS ? ?Skilled Therapeutic Interventions: Skilled treatment session focused on dysphagia and communication goals. SLP facilitated session by providing skilled observation with lunch meal of Dys. 2 textures with thin liquids. Patient consumed meal without overt s/s of aspiration and required intermittent supervision level verbal cues for coordination/timing of the chin tuck with liquids. Recommend patient continue current diet. Patient participated in an informal conversation and was ~70% intelligible with Min verbal cues needed for use of speech intelligibility strategies. SLP also facilitated session with a structured barrier task that focused on speech intelligibility. Patient completed task at the sentence level with overall 80% intelligibility and Min verbal cues to self-monitor and correct communication breakdowns. Patient reports he has been "practicing words" on his own. Patient left upright in bed with alarm on and all needs within reach. Continue with current plan of care.  ?   ? ?Pain ?No/Denies Pain  ? ?Therapy/Group: Individual Therapy ? ?Genova Kiner ?09/17/2021, 3:36 PM ?

## 2021-09-17 NOTE — Progress Notes (Signed)
Occupational Therapy Session Note ? ?Patient Details  ?Name: Christopher [name] ?MRN: [id number] ?Date of Birth: [birth year] ? ?Today's Date: 09/17/2021 ?OT Individual Time: [id number] ?OT Individual Time Calculation (min): 60 min  ? ? ?Short Term Goals: ?Week 1:  OT Short Term Goal 1 (Week 1): STG = LTG 2/2 ELOS ? ?Skilled Therapeutic Interventions/Progress Updates:  ?1:1 Pt received in the bed. Pt declined formal bathing and dressing, but did don clean scrub pants with close supervision. Pt requested to work on right UE. Applied estim on dorsal and ventral portion of forearm and used both channels to elicit mm contraction in conjunction with functional reach and functional gross grasp and release. ?Also focused on proximal strength with controlled movements.No adverse reactions to estim in session. ? ?2nd session 11:30-12:00 ?Pt ambulated to the gym with supervision. On the mat pt able to transition into quadriped with focus on core stability and weight bearing through right UE. Pt able to unweight an arm and  its contralateral LE extended out and then switch. Also performed modified push ups with mild facilitation for support for right shoulder. Returned to supine and continued focus on functional grasp and release and wrist extension and flexion. Began with UE supported in flexion (pointed up to ceiling) at 90 degrees at the shoulder and 0 flexion at elbow. For proximal strengthening.  Pt was able to elicit wrist extension and fingers extension and slight thumb flexion. Returned to sitting and continued with attempts in hand flexion and by the end of the session pt was able to squeeze and release foam block!!!!! ? ?Returned to room and left sitting in bed praticing [facility] activity with foam block ? ? ?Therapy Documentation ?Precautions:  ?Precautions ?Precautions: Fall ?Precaution Comments: dysarthria, RUE hemiparesis ?Restrictions ?Weight Bearing Restrictions: No ? ?  ?Pain: ?1st session: ?Pain Assessment ?Pain Scale:  0-10 ?Pain Score: 0-No pain ?2nd session: ?No pain  ? ?Therapy/Group: Individual Therapy ? ?Nicoletta Ba ?09/17/2021, 8:27 AM ?

## 2021-09-17 NOTE — IPOC Note (Signed)
Overall Plan of Care (IPOC) ?Patient Details ?Name: Christopher [name] ?MRN: [id number] ?DOB: [birth year] ? ?Admitting Diagnosis: Acute ischemic left MCA stroke ([place]) ? ?Hospital Problems: Principal Problem: ?  Acute ischemic left MCA stroke ([place]) ? ? ? ? Functional Problem List: ?Nursing Bowel, Medication Management, Safety, Pain, Endurance  ?PT Balance, Endurance, Motor  ?OT Balance, Endurance, Motor  ?SLP Motor, Sensory  ?TR    ?    ? Basic ADL?s: ?OT Eating, Grooming, Bathing, Dressing, Toileting  ? ?  Advanced  ADL?s: ?OT Simple Meal Preparation  ?   ?Transfers: ?PT Bed Mobility, Bed to Chair, Car, Furniture, Floor  ?OT Toilet, Tub/Shower  ? ?  Locomotion: ?PT Ambulation, Wheelchair Mobility, Stairs  ? ?  Additional Impairments: ?OT Fuctional Use of Upper Extremity  ?SLP Swallowing, Communication, Social Cognition ?expression ?Memory, Attention  ?TR    ? ? ?Anticipated Outcomes ?Item Anticipated Outcome  ?Self Feeding mod I  ?Swallowing ? sup A ?  ?Basic self-care ? mod I to S  ?Toileting ? mod I ?  ?Bathroom Transfers S shower, mod I toilet  ?Bowel/Bladder ? manage bowel w mod I  ?Transfers ? indep no AD  ?Locomotion ? Modified indep. ambulatory  ?Communication ? sup A multimodal communciation; min-to-mod A speech intelligibility  ?Cognition ?    ?Pain ? pain at or below l evel 4 with prns  ?Safety/Judgment ? maintain safety w cues  ? ?Therapy Plan: ?PT Intensity: Minimum of 1-2 x/day ,45 to 90 minutes ?PT Frequency: 5 out of 7 days ?PT Duration Estimated Length of Stay: 5-7 days ?OT Intensity: Minimum of 1-2 x/day, 45 to 90 minutes ?OT Frequency: 5 out of 7 days ?OT Duration/Estimated Length of Stay: 5-7 days ?SLP Intensity: Minumum of 1-2 x/day, 30 to 90 minutes ?SLP Frequency: 3 to 5 out of 7 days ?SLP Duration/Estimated Length of Stay: 5-7 days ? ? Team Interventions: ?Nursing Interventions Disease Management/Prevention, Medication Management, Discharge Planning, Patient/Family Education, Bowel Management, Pain  Management  ?PT interventions Ambulation/gait training, Discharge planning, Functional mobility training, Visual/perceptual remediation/compensation, Therapeutic Activities, Psychosocial support, Balance/vestibular training, Disease management/prevention, Neuromuscular re-education, Therapeutic Exercise, Wheelchair propulsion/positioning, Cognitive remediation/compensation, DME/adaptive equipment instruction, Splinting/orthotics, UE/LE Strength taining/ROM, Community reintegration, Technical sales engineer stimulation, Patient/family education, Stair training, UE/LE Coordination activities, Pain management  ?OT Interventions Balance/vestibular training, Neuromuscular re-education, Self Care/advanced ADL retraining, Therapeutic Exercise, Disease mangement/prevention, DME/adaptive equipment instruction, UE/LE Strength taining/ROM, UE/LE Coordination activities, Splinting/orthotics, Community reintegration, Functional electrical stimulation, Patient/family education, Discharge planning, Functional mobility training, Psychosocial support, Therapeutic Activities  ?SLP Interventions Speech/Language facilitation, Oral motor exercises, Cognitive remediation/compensation, Dysphagia/aspiration precaution training, Functional tasks, Patient/family education, Internal/external aids  ?TR Interventions    ?SW/CM Interventions Discharge Planning, Psychosocial Support, Patient/Family Education, Disease Management/Prevention  ? ?Barriers to Discharge ?MD  Medical stability, Home enviroment access/loayout, and Medication compliance  ?Nursing Decreased caregiver support, Home environment access/layout ?1 level 3 ste left rail w sister  ?PT Insurance for SNF coverage, Home environment access/layout, Medication compliance ?   ?OT   ?   ?SLP   ?   ?SW Lack of/limited family support, Insurance for SNF coverage, Decreased caregiver support ?   ? ?Team Discharge Planning: ?Destination: PT-Home ,OT- Home , SLP-Home ?Projected Follow-up:  PT-Outpatient PT, OT-  Outpatient OT, SLP-24 hour supervision/assistance, Outpatient SLP ?Projected Equipment Needs: PT-To be determined, OT- To be determined, SLP-None recommended by SLP ?Equipment Details: PT- , OT-  ?Patient/family involved in discharge planning: PT- Patient,  OT-Patient, Family member/caregiver, SLP-Patient ? ?MD ELOS: 10-12 ?Medical Rehab  Prognosis:  Good ?Assessment:  ? 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 The team will be addressing functional mobility, strength, stamina, balance, safety, adaptive techniques and equipment, self-care, bowel and bladder mgt, patient and caregiver education, . Goals have been set at Mod I to supervision . Anticipated discharge destination is home ? ? ?   ? ? ?See Team Conference Notes for weekly updates to the plan of care  ?

## 2021-09-18 DIAGNOSIS — D72829 Elevated white blood cell count, unspecified: Secondary | ICD-10-CM

## 2021-09-18 NOTE — Progress Notes (Signed)
?                                                       PROGRESS NOTE ? ? ?Subjective/Complaints: ?In bed this AM. No concerns today.  ? ?Review of Systems  ?Constitutional:  Negative for chills and fever.  ?Respiratory:  Negative for shortness of breath.   ?Cardiovascular:  Negative for chest pain.  ?Gastrointestinal:  Negative for abdominal pain, constipation, diarrhea, heartburn and nausea.  ?Genitourinary:  Negative for dysuria.  ?Musculoskeletal:  Negative for neck pain.  Denies pain ? ? ? ?Objective: ?  ?No results found. ?No results for input(s): WBC, HGB, HCT, PLT in the last 72 hours. ? ?No results for input(s): NA, K, CL, CO2, GLUCOSE, BUN, CREATININE, CALCIUM in the last 72 hours. ? ? ?Intake/Output Summary (Last 24 hours) at 09/18/2021 1254 ?Last data filed at 09/18/2021 9379 ?Gross per 24 hour  ?Intake 840 ml  ?Output 800 ml  ?Net 40 ml  ? ?  ? ?  ? ?Physical Exam: ?Vital Signs ?Blood pressure 120/76, pulse 69, temperature 98.1 ?F (36.7 ?C), temperature source Oral, resp. rate 18, height 5\' 9"  (1.753 m), weight 57.9 kg, SpO2 93 %. ? ?Gen: no distress, normal appearing, in BED ?HEENT: oral mucosa pink and moist, NCAT ?Cardio: Reg rate ?Chest: normal effort, non-labored breathing ?Abdomen: Soft, non-tender, non-distended, bowel sounds positive. ?Extremities: No clubbing, cyanosis, or edema.  ?Psych: Pt's affect is appropriate. Pt is cooperative ?Skin: warm and dry ?Neuro:  Very dysarthric speech but a little improved, Answers with thumbs up or down or writing on board. Follows commands.  ?CN 2-12 intact ?Looking in all directions ? ?Musculoskeletal:  ? ?Sensation to LT intact all 4 extremities ?Strength 5/5 in LUE and LLE ?RLE 4+/5 ?RUE Strength 4/5 proximal RUE, 0-1/5 distal ? ?No joint swelling noted ? ?Assessment/Plan: ?1. Functional deficits which require 3+ hours per day of interdisciplinary therapy in a comprehensive inpatient rehab setting. ?Physiatrist is providing close team supervision and 24  hour management of active medical problems listed below. ?Physiatrist and rehab team continue to assess barriers to discharge/monitor patient progress toward functional and medical goals ? ?Care Tool: ? ?Bathing ?   ?Body parts bathed by patient: Right arm, Chest, Abdomen, Front perineal area, Buttocks, Right upper leg, Left upper leg, Right lower leg, Face, Left lower leg  ? Body parts bathed by helper: Left arm ?  ?  ?Bathing assist Assist Level: Minimal Assistance - Patient > 75% ?  ?  ?Upper Body Dressing/Undressing ?Upper body dressing   ?What is the patient wearing?: Pull over shirt ?   ?Upper body assist Assist Level: Set up assist ?   ?Lower Body Dressing/Undressing ?Lower body dressing ? ? ?   ?What is the patient wearing?: Pants ? ?  ? ?Lower body assist Assist for lower body dressing: Supervision/Verbal cueing ?   ? ?Toileting ?Toileting Toileting Activity did not occur ( and hygiene only): N/A (no void or bm)  ?Toileting assist Assist for toileting: Supervision/Verbal cueing ?  ?  ?Transfers ?Chair/bed transfer ? ?Transfers assist ?   ? ?Chair/bed transfer assist level: Supervision/Verbal cueing ?  ?  ?Locomotion ?Ambulation ? ? ?Ambulation assist ? ?   ? ?Assist level: Supervision/Verbal cueing ?Assistive device: No Device ?Max distance: 1200  ? ?Walk 10 feet  activity ? ? ?Assist ?   ? ?Assist level: Supervision/Verbal cueing ?Assistive device: No Device  ? ?Walk 50 feet activity ? ? ?Assist   ? ?Assist level: Supervision/Verbal cueing ?Assistive device: No Device  ? ? ?Walk 150 feet activity ? ? ?Assist   ? ?Assist level: Supervision/Verbal cueing ?Assistive device: No Device ?  ? ?Walk 10 feet on uneven surface  ?activity ? ? ?Assist   ? ? ?Assist level: Supervision/Verbal cueing ?   ? ?Wheelchair ? ? ? ? ?Assist Is the patient using a wheelchair?: No ?  ?Wheelchair activity did not occur: N/A ? ?  ?   ? ? ?Wheelchair 50 feet with 2 turns activity ? ? ? ?Assist ? ?  ?Wheelchair 50  feet with 2 turns activity did not occur: N/A ? ? ?   ? ?Wheelchair 150 feet activity  ? ? ? ?Assist ? Wheelchair 150 feet activity did not occur: N/A ? ? ?   ? ?Blood pressure 120/76, pulse 69, temperature 98.1 ?F (36.7 ?C), temperature source Oral, resp. rate 18, height 5\' 9"  (1.753 m), weight 57.9 kg, SpO2 93 %. ?Medical Problem List and Plan: ?1. 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            -patient may shower ?            -ELOS/Goals:  10-12 days Mod I to supervision with PT, OT and SLP ? -PT/OT/SLP evaluations ? -intelligibility improving with SLP ?2.  Impaired mobility: continue Lovenox will start 40 mg daily ?            -antiplatelet therapy: aspirin 325 mg daily ?3. Pain Management: Tylenol,  Robaxin PRN ?4. Mood: LCSW to evaluate and provide emotional support ?            -antipsychotic agents: n/a ?5. Neuropsych: This patient is capable of making decisions on his own behalf. ?6. Skin/Wound Care: routine skin care checks ?7. Fluids/Electrolytes/Nutrition: routine Is and Os and follow-up chemistries ?8: Dysphagia: continue SLP, continues on dysphagia 2 diet with thin liquids ?9: Hypertension: no meds; monitor (lisinopril 20 mg at home) ?           -BP well controlled, continue to hold lisinopril ?10: Hyperlipidemia: continue Atorvastatin 20 mg ?11: Prediabetes: glucose range is 95-115. Does not need CBG checks.  ?12: Polysubstance abuse: counseled on health risks and advised not to use ?            --tobacco use ?            --cocaine use ?            --alcohol use ?            --marijuana use ?13: Peptic ulcer disease: continue pantoprazole ?14: Left shoulder rotator cuff tendinitis>>Dr. as outpatient ?15: Acute kidney injury ?-creatinine improved from 2.11 to 1.29 ?-Labs tomorrow, encouraged fluids ?16: Mild leukocytosis, afebrile: monitor; follow-up CBC with diff ? -Labs tomorrow ?17: Exudate of tongue, likely oral thrush. Continue  Nystatin ?  ?  ? ?LOS: ?4 days ?A FACE TO FACE EVALUATION WAS PERFORMED ? ?Roda Shutters ?09/18/2021, 12:54 PM  ? ?  ?

## 2021-09-19 LAB — CBC
HCT: 41 % (ref 39.0–52.0)
Hemoglobin: 13.8 g/dL (ref 13.0–17.0)
MCH: 29.9 pg (ref 26.0–34.0)
MCHC: 33.7 g/dL (ref 30.0–36.0)
MCV: 88.7 fL (ref 80.0–100.0)
Platelets: 376 10*3/uL (ref 150–400)
RBC: 4.62 MIL/uL (ref 4.22–5.81)
RDW: 11.5 % (ref 11.5–15.5)
WBC: 8.9 10*3/uL (ref 4.0–10.5)
nRBC: 0 % (ref 0.0–0.2)

## 2021-09-19 LAB — BASIC METABOLIC PANEL
Anion gap: 8 (ref 5–15)
BUN: 20 mg/dL (ref 6–20)
CO2: 30 mmol/L (ref 22–32)
Calcium: 9.5 mg/dL (ref 8.9–10.3)
Chloride: 96 mmol/L — ABNORMAL LOW (ref 98–111)
Creatinine, Ser: 1.36 mg/dL — ABNORMAL HIGH (ref 0.61–1.24)
GFR, Estimated: >60 mL/min (ref >60)
Glucose, Bld: 112 mg/dL — ABNORMAL HIGH (ref 70–99)
Potassium: 4.5 mmol/L (ref 3.5–5.1)
Sodium: 134 mmol/L — ABNORMAL LOW (ref 135–145)

## 2021-09-19 NOTE — Progress Notes (Signed)
Occupational Therapy Session Note ? ?Patient Details  ?Name: Christopher [name] ?MRN: [id number] ?Date of Birth: 03-31-1966 ? ?Today's Date: 09/19/2021 ?OT Individual Time: 3557-3220 ?OT Individual Time Calculation (min): 30 min  ? ? ?Short Term Goals: ?Week 1:  OT Short Term Goal 1 (Week 1): STG = LTG 2/2 ELOS ? ?Skilled Therapeutic Interventions/Progress Updates:  ?  NM re-ed focusing on RUE active movement while seated at table in order to increase use of his RUE participation and assist while completing ADL tasks.  ?A/ROM and AA/ROM: wrist flexion, extension, 3x5 in gravity eliminated plane  ?A/ROM and AA/ROM: thumb and hand, flexion, extension,  3x5, against gravity ?Muscle tapping used facilitate activation with wrist and hand activities while providing VC and physical stabilization of the RUE to eliminate compensatory movement.  ?Patient education provided throughout session on how to work on this skill in room independently. Education provided verbally with demonstration. Patient verbalized understanding. ?Dry erase board used during session for communication. SBA provided for ambulation to and from room to therapy gym.  ? ?Therapy Documentation ?Precautions:  ?Precautions ?Precautions: Fall ?Precaution Comments: dysarthria, RUE hemiparesis ?Restrictions ?Weight Bearing Restrictions: No ? ?Pain: ?0/10 ? ?Therapy/Group: Individual Therapy ? ?Limmie Patricia, OTR/L,CBIS  ?[phone] ?09/19/2021, 3:22 PM ?

## 2021-09-19 NOTE — Progress Notes (Signed)
Speech Language Pathology Daily Session Note ? ?Patient Details  ?Name: Christopher [name] ?MRN: [id number] ?Date of Birth: [birth year] ? ?Today's Date: 09/19/2021 ?SLP Individual Time: 9758-8325 and 1218 and 1228 ?SLP Individual Time Calculation (min): 57 min and 10 minutes ? ?Short Term Goals: ?Week 1: SLP Short Term Goal 1 (Week 1): STG=LTG due to ELOS ? ?Skilled Therapeutic Interventions:  ?Session 1: Skilled ST treatment focused on swallowing and speech goals. SLP facilitated session by providing skilled observation during breakfast with dysphagia 2 textures and thin liquids. Pt consumed meal without overt s/sx of aspiration and was at mod I level for implementation of chin tuck and safe swallowing precautions. SLP also facilitated dysphagia 3 trial with chopped sausage. Pt exhibited mildly prolonged yet effective mastication, and without overt s/sx of aspiration. Pt exhibited immediate cough response x1 with cup sip of thin liquids without presentation of solids, and despite chin tuck position. Pt reported that he felt he consumed a larger sip than anticipated and was able to self monitor and modify bolus size with all subsequent cup sips throughout session with no further s/sx of aspiration. Recommend continuation of current diet with dys 3 trial tray during lunch. SLP facilitated speech intelligibility strategies at the sentence level during functional speech tasks (e.g., ordering lunch/dinner menu, discussing food preferences) with min A to achieve 75% intelligibility. Pt carried over use of strategies to achieve 50% progressing to 75% intelligibility at the conversation level with min A verbal cues to correct communication breakdowns. Patient was left in bed with alarm activated and immediate needs within reach at end of session. Continue per current plan of care.   ? ?Session 2: Pt seen for consumption of Dys 3 trial tray. Similar to PO trial this a.m., pt exhibited mildly prolonged yet effective mastication,  oral clearance with only trace residuals, and no overt s/sx of aspiration with both solids and thin liquids. Pt was mod I for implementation of chin tuck. Recommend diet advancement of dysphagia 3 textures and thin liquids with intermittent supervision during meals. Pt was educated and verbalized understanding and agreement with plan. Pt was left in bed with alarm activated and all immediate needs met.  ?   ?Pain ?Pain Assessment ?Pain Scale: 0-10 ?Pain Score: 0-No pain ? ?Therapy/Group: Individual Therapy ? ?Mohamedamin Nifong T Bindi Klomp ?09/19/2021, 8:06 AM ?

## 2021-09-19 NOTE — Progress Notes (Signed)
Continent using urinal. LBM 05/13. Refusing right resting hand splint. Reports he likes moving and exercising hand at HS. Will use white dry erase board if unable to communicate to staff. Denies pain. Christopher [name] A   ?

## 2021-09-19 NOTE — Progress Notes (Signed)
Physical Therapy Session Note ? ?Patient Details  ?Name: Christopher [name] ?MRN: [id number] ?Date of Birth: 11-11-1965 ? ?Today's Date: 09/19/2021 ?PT Individual Time: 1245-8099 and 8338-2505 ?PT Individual Time Calculation (min): 46 min and 63 min  ? ?Short Term Goals: ?Week 1:  PT Short Term Goal 1 (Week 1): STG=LTG due to ELOS ? ?Skilled Therapeutic Interventions/Progress Updates:  ?Session 1: ?Patient supine in bed on entrance to room. Patient alert and agreeable to PT session.  ? ?Patient with no pain complaint throughout session. Takes whiteboard and marker along to therapy session. ? ?Therapeutic Activity: ?Bed Mobility: Patient performed supine <> sit with IND and no use of bedrails. No cues required for technique/ safety. ?Transfers: Patient performed sit<>stand and stand pivot transfers throughout session with supervision. Provided verbal cues for reaching back to seat to ensure proximity and positioning. ? ?Gait Training/ NMR:  ?Patient ambulated >100 feet x3 using no AD with good balance and supervision. Demonstrated appropriate step height/ length bilaterally with good pace.  ? ?NMR facilitated during session with focus on dynamic gait and balance. Pt guided in agility ladder tasks progressing from  step in each square to high knee stepping to alternating tap of toes to cones placed laterally to ladder, and finally to holding of medium sized t-ball throughout alternating toe taps.  ? ?Pt insistent to work on UE. Guided in modified quadriped with BUE on mat table for improve approximation of joints with WB to RUE as well as coordination and muscle fiber recruitment for opening/ closing R hand and touching to called color dots.  Attempt for grasping to large pegs with pt unable to complete without allowed assist from LUE.NMR performed for improvements in motor control and coordination, balance, sequencing, judgement, and self confidence/ efficacy in performing all aspects of mobility at highest level of  independence.  ? ?Patient supine  in bed at end of session with brakes locked, bed alarm set, and all needs within reach. ? ?Session 2:  ?Patient supine in bed on entrance to room. Patient alert and agreeable to PT session. Dons shoes and tightens laces with assist provided for tying.  ? ?Patient with no pain complaint throughout session. ? ?Therapeutic Activity: ?Bed Mobility: Patient performed supine <> sit with IND. No cueing required. ?Transfers: Patient performed sit<>stand and stand pivot transfers throughout session with supervision. Provided verbal cues forfinding positioning. ? ?Gait Training/ NMR:  ?Patient ambulated over even and uneven surfaces >1000 ft to [facility] entrance  with supervision. Demonstrated good quality of gait. Provided vc/ tc for directionality. ?NMR facilitated during session with focus on retireving horseshoes from around the day room and using RUE to hang horseshoes on basketball rim. Requires assist from LUE and/ or therapist to maintain grasp. Improved shoulder AROM noted. Pt guided in bounce of ball while walking indoor/ outdoor for dual tasking. Following stair negotiation with close supervision, pt able to ambulate with bounce of ball and recitation of alphabet. NMR performed for improvements in motor control and coordination, balance, sequencing, judgement, and self confidence/ efficacy in performing all aspects of mobility at highest level of independence.  ? ?Patient supine  in bed at end of session with brakes locked, bed alarm set, and all needs within reach. Pt relates with spoken sowrd and use of board to relate that he wishes to leave Wednesday as this date will work best for pt and ride.  ? ? ? ?Therapy Documentation ?Precautions:  ?Precautions ?Precautions: Fall ?Precaution Comments: dysarthria, RUE hemiparesis ?Restrictions ?Weight Bearing Restrictions:  No ?General: ?  ?Vital Signs: ?  ?Pain: ?Pain Assessment ?Pain Scale: 0-10 ?Pain Score: 0-No pain ? ?Therapy/Group:  Individual Therapy ? ?Loel Dubonnet PT, DPT ?09/19/2021, 10:25 AM  ?

## 2021-09-19 NOTE — Patient Care Conference (Signed)
Inpatient RehabilitationTeam Conference and Plan of Care Update ?Date: 09/20/2021   Time: 14:16 PM  ? ? ?Patient Name: Christopher [name]      ?Medical Record Number: [id number]  ?Date of Birth: Jan 02, 1966 ?Sex: Male         ?Room/Bed: 4W11C/4W11C-01 ?Payor Info: Payor: /   ? ?Admit Date/Time:  09/14/2021 12:18 PM ? ?Primary Diagnosis:  Acute ischemic left MCA stroke ([place]) ? ?Hospital Problems: Principal Problem: ?  Acute ischemic left MCA stroke ([place]) ? ? ? ?Expected Discharge Date: Expected Discharge Date: 09/21/21 ? ?Team Members Present: ?Physician leading conference: Dr. Alysia Penna ?Social Worker Present: Erlene Quan, BSW ?Nurse Present: Dorien Chihuahua, RN ?PT Present: Alden Hipp, PT ?OT Present: Providence Lanius, OT ?SLP Present: Sherren Kerns, SLP ? ?   Current Status/Progress Goal Weekly Team Focus  ?Bowel/Bladder ? ? Continent x 2, LBM 09/17/21  To be continent x 2  Toilet patient every two/three hours to ensure patient is continent   ?Swallow/Nutrition/ Hydration ? ? dys 3 diet and thin liquids, mod I-to-sup A for implementation of safe swallowing precautions - chin tuck in particular  sup A  Tolerance of current diet with consistent implementation of chin tuck   ?ADL's ? ?           ?Mobility ? ? Bed mobility = IND; Transfers = Mod I; Gait = no AD for >1200 ft with IND  IND/ Mod I overall  R hemibody NMR UE >LE, general balance, safety   ?Communication ? ? min-to-mod A  min-to-mod A  Speech intelligiblity strategies   ?Safety/Cognition/ Behavioral Observations ?           ?Pain ? ? Patient reports no pain  To remain free from pain  During rounding ask patient if having pain   ?Skin ? ? Patient skin is intact  For skin to remian intact  Assess skin every shift for changes and treat as necessary   ? ? ?Discharge Planning:  ?Patient discharging home on Wednesday with sister providing supervision. OP referral faxed to [facility]. NO DME needed.   ?Team Discussion: ?Patient is doing well  overall. ? ?Patient on target to meet rehab goals: ?yes, patient carried over use of strategies to achieve 50% progressing to 75% intelligibility at the conversation level with min A verbal cues to correct communication breakdowns. ? ?*See Care Plan and progress notes for long and short-term goals.  ? ?Revisions to Treatment Plan:  ?Diet advancement of dysphagia 3 textures and thin liquids with intermittent supervision during meals. ?E-stim ? ?Teaching Needs: ?Safety, transfers, diet, medication, secondary risk management, etc  ?Current Barriers to Discharge: ?Decreased caregiver support ? ?Possible Resolutions to Barriers: ?Family education ?OP follow up services ?No DME ?  ? ? Medical Summary ?Current Status: No dizziness, speech dysarthric with Right hemiparesis ? Barriers to Discharge: Other (comments) ? Barriers to Discharge Comments: hx of substance abuse ?Possible Resolutions to [employer] Focus: fall risk ? ? ?Continued Need for Acute Rehabilitation Level of Care: The patient requires daily medical management by a physician with specialized training in physical medicine and rehabilitation for the following reasons: ?Direction of a multidisciplinary physical rehabilitation program to maximize functional independence : Yes ?Medical management of patient stability for increased activity during participation in an intensive rehabilitation regime.: Yes ?Analysis of laboratory values and/or radiology reports with any subsequent need for medication adjustment and/or medical intervention. : Yes ? ? ?I attest that I was present, lead the team conference, and concur  with the assessment and plan of the team. ? ? ?[name] ?09/20/2021, 10:58 AM  ? ? ? ? ? ? ?

## 2021-09-19 NOTE — Progress Notes (Signed)
?                                                       PROGRESS NOTE ? ? ?Subjective/Complaints: ?In bed this AM. No concerns today.  ? ?Review of Systems  ?Constitutional:  Negative for chills and fever.  ?Respiratory:  Negative for shortness of breath.   ?Cardiovascular:  Negative for chest pain.  ?Gastrointestinal:  Negative for abdominal pain, constipation, diarrhea, heartburn and nausea.  ?Genitourinary:  Negative for dysuria.  ?Musculoskeletal:  Negative for neck pain.  Denies pain ? ? ? ?Objective: ?  ?No results found. ?Recent Labs  ?  09/19/21 ?0807  ?WBC 8.9  ?HGB 13.8  ?HCT 41.0  ?PLT 376  ? ? ?Recent Labs  ?  09/19/21 ?0807  ?NA 134*  ?K 4.5  ?CL 96*  ?CO2 30  ?GLUCOSE 112*  ?BUN 20  ?CREATININE 1.36*  ?CALCIUM 9.5  ? ? ? ?Intake/Output Summary (Last 24 hours) at 09/19/2021 0952 ?Last data filed at 09/18/2021 2000 ?Gross per 24 hour  ?Intake 500 ml  ?Output 600 ml  ?Net -100 ml  ? ?  ? ?  ? ?Physical Exam: ?Vital Signs ?Blood pressure 122/82, pulse 63, temperature 98.7 ?F (37.1 ?C), temperature source Oral, resp. rate 17, height 5\' 9"  (1.753 m), weight 57.9 kg, SpO2 97 %. ? ? ?General: No acute distress ?Mood and affect are appropriate ?Heart: Regular rate and rhythm no rubs murmurs or extra sounds ?Lungs: Clear to auscultation, breathing unlabored, no rales or wheezes ?Abdomen: Positive bowel sounds, soft nontender to palpation, nondistended ?Extremities: No clubbing, cyanosis, or edema ?Skin: No evidence of breakdown, no evidence of rash ?Neurologic: Cranial nerves II through XII intact except R central 7,  motor strength is 5/5 in left and 3- RIght deltoid, bicep, tricep, grip,5/5 Bilateral  hip flexor, knee extensors, ankle dorsiflexor and plantar flexor ? ? ?Skin: warm and dry ?Neuro:  Very dysarthric speech but is 75% inteliigible Answers with thumbs up or down or writing on board. Follows commands.  ?CN 2-12 intact ?Looking in all directions ? ?Musculoskeletal:  ? ?Sensation to LT intact all 4  extremities ?Strength 5/5 in LUE and LLE ?RLE 4+/5 ?RUE Strength 4/5 proximal RUE, 0-1/5 distal ? ?No joint swelling noted ? ?Assessment/Plan: ?1. Functional deficits which require 3+ hours per day of interdisciplinary therapy in a comprehensive inpatient rehab setting. ?Physiatrist is providing close team supervision and 24 hour management of active medical problems listed below. ?Physiatrist and rehab team continue to assess barriers to discharge/monitor patient progress toward functional and medical goals ? ?Care Tool: ? ?Bathing ?   ?Body parts bathed by patient: Right arm, Chest, Abdomen, Front perineal area, Buttocks, Right upper leg, Left upper leg, Right lower leg, Face, Left lower leg  ? Body parts bathed by helper: Left arm ?  ?  ?Bathing assist Assist Level: Minimal Assistance - Patient > 75% ?  ?  ?Upper Body Dressing/Undressing ?Upper body dressing   ?What is the patient wearing?: Pull over shirt ?   ?Upper body assist Assist Level: Set up assist ?   ?Lower Body Dressing/Undressing ?Lower body dressing ? ? ?   ?What is the patient wearing?: Pants ? ?  ? ?Lower body assist Assist for lower body dressing: Supervision/Verbal cueing ?   ? ?Toileting ?Toileting Toileting  Activity did not occur (Clothing management and hygiene only): N/A (no void or bm)  ?Toileting assist Assist for toileting: Supervision/Verbal cueing ?  ?  ?Transfers ?Chair/bed transfer ? ?Transfers assist ?   ? ?Chair/bed transfer assist level: Supervision/Verbal cueing ?  ?  ?Locomotion ?Ambulation ? ? ?Ambulation assist ? ?   ? ?Assist level: Supervision/Verbal cueing ?Assistive device: No Device ?Max distance: 1200  ? ?Walk 10 feet activity ? ? ?Assist ?   ? ?Assist level: Supervision/Verbal cueing ?Assistive device: No Device  ? ?Walk 50 feet activity ? ? ?Assist   ? ?Assist level: Supervision/Verbal cueing ?Assistive device: No Device  ? ? ?Walk 150 feet activity ? ? ?Assist   ? ?Assist level: Supervision/Verbal cueing ?Assistive  device: No Device ?  ? ?Walk 10 feet on uneven surface  ?activity ? ? ?Assist   ? ? ?Assist level: Supervision/Verbal cueing ?   ? ?Wheelchair ? ? ? ? ?Assist Is the patient using a wheelchair?: No ?  ?Wheelchair activity did not occur: N/A ? ?  ?   ? ? ?Wheelchair 50 feet with 2 turns activity ? ? ? ?Assist ? ?  ?Wheelchair 50 feet with 2 turns activity did not occur: N/A ? ? ?   ? ?Wheelchair 150 feet activity  ? ? ? ?Assist ? Wheelchair 150 feet activity did not occur: N/A ? ? ?   ? ?Blood pressure 122/82, pulse 63, temperature 98.7 ?F (37.1 ?C), temperature source Oral, resp. rate 17, height 5\' 9"  (1.753 m), weight 57.9 kg, SpO2 97 %. ?Medical Problem List and Plan: ?1. 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            -patient may shower ?            -ELOS/Goals:  10-12 days Mod I to supervision with PT, OT and SLP ? -PT/OT/SLP evaluations ? -intelligibility improving with SLP ?2.  Impaired mobility: continue Lovenox will start 40 mg daily ?            -antiplatelet therapy: aspirin 325 mg daily ?3. Pain Management: Tylenol,  Robaxin PRN ?4. Mood: LCSW to evaluate and provide emotional support ?            -antipsychotic agents: n/a ?5. Neuropsych: This patient is capable of making decisions on his own behalf. ?6. Skin/Wound Care: routine skin care checks ?7. Fluids/Electrolytes/Nutrition: routine Is and Os and follow-up chemistries ?8: Dysphagia: continue SLP, continues on dysphagia 2 diet with thin liquids ?9: Hypertension: no meds; monitor (lisinopril 20 mg at home) ?           -BP well controlled, continue to hold lisinopril ?10: Hyperlipidemia: continue Atorvastatin 20 mg ?11: Prediabetes: glucose range is 95-115. Does not need CBG checks.  ?12: Polysubstance abuse: counseled on health risks and advised not to use ?            --tobacco use ?            --cocaine use ?            --alcohol use ?            --marijuana use ?13: Peptic ulcer  disease: continue pantoprazole ?14: Left shoulder rotator cuff tendinitis>>Dr. as outpatient ?15: Acute kidney injury ?-creatinine improved from 2.11 to 1.29 ?-Labs tomorrow, encouraged fluids ?16: Mild leukocytosis, afebrile: monitor; follow-up CBC with diff ?  ? ?  Latest Ref Rng & Units 09/19/2021  ?  8:07 AM 09/15/2021  ?  5:34 AM 09/10/2021  ? 10:46 AM  ?CBC  ?WBC 4.0 - 10.5 K/uL 8.9   7.8   11.8    ?Hemoglobin 13.0 - 17.0 g/dL 78.413.8   69.612.4   29.513.9    ?Hematocrit 39.0 - 52.0 % 41.0   36.3   40.1    ?Platelets 150 - 400 K/uL 376   298   321    ? ? ?17: Exudate of tongue, likely oral thrush. Continue Nystatin ?  ?  ? ?LOS: ?5 days ?A FACE TO FACE EVALUATION WAS PERFORMED ? ?Victorino Sparrowndrew E Antwan Pandya ?09/19/2021, 9:52 AM  ? ?  ?

## 2021-09-20 NOTE — Progress Notes (Signed)
Patient ID: Christopher [name], male   DOB: 11-29-1965, 56 y.o.   MRN: [id number] ? ?Patient MATCH entered into system. ?ID: [id number] ?Effective: 5/16 ?Term: 5/23 ?

## 2021-09-20 NOTE — Progress Notes (Signed)
?                                                       PROGRESS NOTE ? ? ?Subjective/Complaints: ? ? ?Pt looking forward to d/c in am  ? ?ROS- neg CP, SOB, N/V/D ? ?Objective: ?  ?No results found. ?Recent Labs  ?  09/19/21 ?0807  ?WBC 8.9  ?HGB 13.8  ?HCT 41.0  ?PLT 376  ? ? ?Recent Labs  ?  09/19/21 ?0807  ?NA 134*  ?K 4.5  ?CL 96*  ?CO2 30  ?GLUCOSE 112*  ?BUN 20  ?CREATININE 1.36*  ?CALCIUM 9.5  ? ? ? ?Intake/Output Summary (Last 24 hours) at 09/20/2021 0820 ?Last data filed at 09/20/2021 0801 ?Gross per 24 hour  ?Intake 960 ml  ?Output 1700 ml  ?Net -740 ml  ? ?  ? ?  ? ?Physical Exam: ?Vital Signs ?Blood pressure 129/81, pulse 72, temperature 98.3 ?F (36.8 ?C), resp. rate 18, height 5\' 9"  (1.753 m), weight 57.9 kg, SpO2 94 %. ? ? ?General: No acute distress ?Mood and affect are appropriate ?Heart: Regular rate and rhythm no rubs murmurs or extra sounds ?Lungs: Clear to auscultation, breathing unlabored, no rales or wheezes ?Abdomen: Positive bowel sounds, soft nontender to palpation, nondistended ?Extremities: No clubbing, cyanosis, or edema ?Skin: No evidence of breakdown, no evidence of rash ?Neurologic: Cranial nerves II through XII intact except R central 7,  motor strength is 5/5 in left and 3- RIght deltoid, bicep, tricep, grip,5/5 Bilateral  hip flexor, knee extensors, ankle dorsiflexor and plantar flexor ? ? ?Skin: warm and dry ?Neuro:  Very dysarthric speech but is 75% inteliigible Answers with thumbs up or down or writing on board. Follows commands.  ?CN 2-12 intact ?Looking in all directions ? ?Musculoskeletal:  ? ?Sensation to LT intact all 4 extremities ?Strength 5/5 in LUE and LLE ?RLE 4+/5 ?RUE Strength 4/5 proximal RUE, 0-1/5 distal ? ?No joint swelling noted ? ?Assessment/Plan: ?1. Functional deficits which require 3+ hours per day of interdisciplinary therapy in a comprehensive inpatient rehab setting. ?Physiatrist is providing close team supervision and 24 hour management of active medical  problems listed below. ?Physiatrist and rehab team continue to assess barriers to discharge/monitor patient progress toward functional and medical goals ? ?Care Tool: ? ?Bathing ?   ?Body parts bathed by patient: Right arm, Chest, Abdomen, Front perineal area, Buttocks, Right upper leg, Left upper leg, Right lower leg, Face, Left lower leg  ? Body parts bathed by helper: Left arm ?  ?  ?Bathing assist Assist Level: Minimal Assistance - Patient > 75% ?  ?  ?Upper Body Dressing/Undressing ?Upper body dressing   ?What is the patient wearing?: Pull over shirt ?   ?Upper body assist Assist Level: Set up assist ?   ?Lower Body Dressing/Undressing ?Lower body dressing ? ? ?   ?What is the patient wearing?: Pants ? ?  ? ?Lower body assist Assist for lower body dressing: Supervision/Verbal cueing ?   ? ?Toileting ?Toileting Toileting Activity did not occur ( and hygiene only): N/A (no void or bm)  ?Toileting assist Assist for toileting: Supervision/Verbal cueing ?  ?  ?Transfers ?Chair/bed transfer ? ?Transfers assist ?   ? ?Chair/bed transfer assist level: Set up assist ?  ?  ?Locomotion ?Ambulation ? ? ?Ambulation assist ? ?   ? ?  Assist level: Supervision/Verbal cueing ?Assistive device: No Device ?Max distance: 1200  ? ?Walk 10 feet activity ? ? ?Assist ?   ? ?Assist level: Supervision/Verbal cueing ?Assistive device: No Device  ? ?Walk 50 feet activity ? ? ?Assist   ? ?Assist level: Supervision/Verbal cueing ?Assistive device: No Device  ? ? ?Walk 150 feet activity ? ? ?Assist   ? ?Assist level: Supervision/Verbal cueing ?Assistive device: No Device ?  ? ?Walk 10 feet on uneven surface  ?activity ? ? ?Assist   ? ? ?Assist level: Supervision/Verbal cueing ?Assistive device:  (No Device)  ? ?Wheelchair ? ? ? ? ?Assist Is the patient using a wheelchair?: No ?  ?Wheelchair activity did not occur: N/A ? ?  ?   ? ? ?Wheelchair 50 feet with 2 turns activity ? ? ? ?Assist ? ?  ?Wheelchair 50 feet with 2 turns  activity did not occur: N/A ? ? ?   ? ?Wheelchair 150 feet activity  ? ? ? ?Assist ? Wheelchair 150 feet activity did not occur: N/A ? ? ?   ? ?Blood pressure 129/81, pulse 72, temperature 98.3 ?F (36.8 ?C), resp. rate 18, height 5\' 9"  (1.753 m), weight 57.9 kg, SpO2 94 %. ?Medical Problem List and Plan: ?1. 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            -patient may shower ?            -ELOS/Goals:  d/c in am  Mod I to supervision  ? -PT/OT/SLP evaluations ? -intelligibility improving with SLP ?2.  Impaired mobility: continue Lovenox will start 40 mg daily ?            -antiplatelet therapy: aspirin 325 mg daily ?3. Pain Management: Tylenol,  Robaxin PRN ?4. Mood: LCSW to evaluate and provide emotional support ?            -antipsychotic agents: n/a ?5. Neuropsych: This patient is capable of making decisions on his own behalf. ?6. Skin/Wound Care: routine skin care checks ?7. Fluids/Electrolytes/Nutrition: routine Is and Os and follow-up chemistries ?8: Dysphagia: continue SLP, continues on dysphagia 2 diet with thin liquids ?9: Hypertension: no meds; monitor (lisinopril 20 mg at home) ?           -BP well controlled, continue to hold lisinopril- needs PCP f/u as OP  ?Vitals:  ? 09/19/21 2000 09/20/21 0515  ?BP: 130/80 129/81  ?Pulse: 84 72  ?Resp: 16 18  ?Temp: 98.7 ?F (37.1 ?C) 98.3 ?F (36.8 ?C)  ?SpO2: 97% 94%  ? ? ?10: Hyperlipidemia: continue Atorvastatin 20 mg ?11: Prediabetes: glucose range is 95-115. Does not need CBG checks.  ?12: Polysubstance abuse: counseled on health risks and advised not to use ?            --tobacco use ?            --cocaine use ?            --alcohol use ?            --marijuana use ?13: Peptic ulcer disease: continue pantoprazole ?14: Left shoulder rotator cuff tendinitis>>Dr. 09/22/21 as outpatient ?15: Acute kidney injury ?-creatinine improved from 2.11 to 1.29 ?-Labs tomorrow, encouraged fluids ?16: Mild leukocytosis,  afebrile: monitor; follow-up CBC with diff ?  ? ?  Latest Ref Rng & Units 09/19/2021  ?  8:07 AM 09/15/2021  ?  5:34 AM 09/10/2021  ? 10:46 AM  ?  CBC  ?WBC 4.0 - 10.5 K/uL 8.9   7.8   11.8    ?Hemoglobin 13.0 - 17.0 g/dL 16.113.8   09.612.4   04.513.9    ?Hematocrit 39.0 - 52.0 % 41.0   36.3   40.1    ?Platelets 150 - 400 K/uL 376   298   321    ? ? ?  ?  ? ?LOS: ?6 days ?A FACE TO FACE EVALUATION WAS PERFORMED ? ?Victorino Sparrowndrew E Ramzey Petrovic ?09/20/2021, 8:20 AM  ? ?  ?

## 2021-09-20 NOTE — Progress Notes (Signed)
Patient declining to wear right hand wrist splint at this time.  ?

## 2021-09-20 NOTE — Progress Notes (Signed)
Occupational Therapy Discharge Summary ? ?Patient Details  ?Name: Christopher [name] ?MRN: [id number] ?Date of Birth: [birth year] ? ?Today's Date: 09/20/2021 ?OT Individual Time: 0701-0801 ?OT Individual Time Calculation (min): 60 min + 41 min ? ? ?Patient has met  12 of 12 long term goals due to improved activity tolerance, improved balance, postural control, ability to compensate for deficits, functional use of  RIGHT upper extremity, and improved coordination.  Patient to discharge at overall Modified Independent level.  Patient's care partner is independent to provide the necessary physical assistance at discharge.  Pt to DC at the below ADL/bathroom transfer performance level. Assist levels represent pt's most consistent performance when alert/participatory. Pt continues to be primarily limited by RUE HP and dysarthria. Family education not formally completed Pt and caregivers will benefit from continued OP OT to facilitate improved caregiver education, functional mobility, and occupational performance.  ? ? ?Reasons goals not met: NA ? ?Recommendation:  ?Patient will benefit from ongoing skilled OT services in outpatient setting to continue to advance functional skills in the area of BADL, iADL, Vocation, and Reduce care partner burden. ? ?Equipment: ?No equipment provided ? ?Reasons for discharge: treatment goals met and discharge from hospital ? ?Patient/family agrees with progress made and goals achieved: Yes ? ?OT Discharge ?Precautions/Restrictions  ?Precautions ?Precautions: Fall ?Precaution Comments: dysarthria, RUE hemiparesis ?Restrictions ?Weight Bearing Restrictions: No ? ?Pain ? " 4/5" in R shoulder when he raises RUE, declines intervention at this time, reviewed therapeutic positioning for comfort ?ADL ?ADL ?Eating: Set up, Supervision/safety ?Where Assessed-Eating: Edge of bed, Bed level ?Grooming: Supervision/safety ?Where Assessed-Grooming: Standing at sink ?Upper Body Bathing: Minimal  assistance ?Where Assessed-Upper Body Bathing: Edge of bed ?Lower Body Bathing: Minimal assistance ?Where Assessed-Lower Body Bathing: Edge of bed ?Upper Body Dressing: Supervision/safety ?Where Assessed-Upper Body Dressing: Edge of bed ?Lower Body Dressing: Minimal assistance ?Where Assessed-Lower Body Dressing: Edge of bed ?Toileting: Minimal assistance ?Where Assessed-Toileting: Toilet ?Toilet Transfer: Close supervision ?Toilet Transfer Method: Ambulating ?Science writer: Grab bars ?Tub/Shower Transfer: Contact guard ?Tub/Shower Equipment: Shower seat without back ?Walk-In Shower Transfer: Not assessed ?Vision ?Baseline Vision/History: 1 Wears glasses (readers) ?Patient Visual Report: No change from baseline ?Vision Assessment?: No apparent visual deficits ?Perception  ?Perception: Within Functional Limits ?Praxis ?Praxis: Intact ?Cognition ?Cognition ?Overall Cognitive Status: Within Functional Limits for tasks assessed ?Arousal/Alertness: Awake/alert ?Orientation Level: Person;Place;Situation ?Person: Oriented ?Place: Oriented ?Situation: Oriented ?Attention: Sustained ?Sustained Attention: Appears intact ?Awareness: Appears intact ?Problem Solving: Appears intact ?Safety/Judgment: Appears intact ?Brief Interview for Mental Status (BIMS) ?Repetition of Three Words (First Attempt): 3 ?Temporal Orientation: Year: Correct ?Temporal Orientation: Month: Accurate within 5 days ?Temporal Orientation: Day: Correct ?Recall: "Sock": Yes, no cue required ?Recall: "Blue": Yes, no cue required ?Recall: "Bed": Yes, no cue required ?BIMS Summary Score: 15 ?Sensation ?Sensation ?Light Touch: Appears Intact ?Hot/Cold: Appears Intact ?Proprioception: Appears Intact ?Stereognosis: Impaired Detail ?Stereognosis Impaired Details: Impaired RUE ?Coordination ?Gross Motor Movements are Fluid and Coordinated: Yes ?Fine Motor Movements are Fluid and Coordinated: No ?Coordination and Movement Description: RUE HP ?Finger Nose  Finger Test: unable to isolate R index fingeR to complete ?Motor  ?Motor ?Motor: Hemiplegia ?Motor - Discharge Observations: RUE hemiparesis ?Mobility  ?Bed Mobility ?Bed Mobility: Sit to Supine;Supine to Sit ?Supine to Sit: Independent ?Sit to Supine: Independent ?Transfers ?Sit to Stand: Independent ?Stand to Sit: Independent  ?Trunk/Postural Assessment  ?Cervical Assessment ?Cervical Assessment: Within Functional Limits ?Thoracic Assessment ?Thoracic Assessment: Within Functional Limits ?Lumbar Assessment ?Lumbar Assessment: Within Functional Limits ?Postural Control ?Postural Control: Within  Functional Limits  ?Balance ?Balance ?Balance Assessed: Yes ?Static Sitting Balance ?Static Sitting - Balance Support: Feet supported ?Static Sitting - Level of Assistance: 7: Independent ?Dynamic Sitting Balance ?Dynamic Sitting - Balance Support: Feet supported ?Static Standing Balance ?Static Standing - Balance Support: No upper extremity supported;During functional activity ?Static Standing - Level of Assistance: 7: Independent ?Dynamic Standing Balance ?Dynamic Standing - Balance Support: During functional activity;No upper extremity supported ?Dynamic Standing - Level of Assistance: 7: Independent ?Extremity/Trunk Assessment ?RUE Assessment ?RUE Assessment: Exceptions to [facility] ?Active Range of Motion (AROM) Comments: some active digit flexion/extension on R ?RUE Body System: Neuro ?Brunstrum levels for arm and hand: Arm;Hand ?Brunstrum level for arm: Stage V Relative Independence from Synergy ?Brunstrum level for hand: Stage II Synergy is developing ?LUE Assessment ?LUE Assessment: Within Functional Limits ? ?Session 1 [phone]): Pt received semi-reclined in bed finishing breakfast, reports ongoing R shoulder pain (see above), agreeable to therapy. Session focus on self-care retraining, activity tolerance, RUE NMR in prep for improved ADL/IADL/func mobility performance + decreased caregiver burden. ? ?Completed DC  reassessments as documented above. Declined ADL until this AM. Donned B shoes with min A only to tie them. Issued elastic shoe laces and reviewed use, pt states he prefers to apply himself at home. ? ?Issued printed HEP and soft tan theraputty (putty squeezes, wrist extension/flexion, digit flexion/extension, and rolling into log shape). Able to return demonstration.  ? ?Pt did not need to use white board this session to communicate, able to communicate verbally. ? ?Pt left seated EOB awaiting following SLP session with bed alarm engaged, call bell in reach, and all immediate needs met.  ? ? ?Session 2 [phone]) : Pt received semi-reclined in bed, no c/o pain, agreeable to therapy. Session focus on self-care retraining, activity tolerance, RUE NMR in prep for improved ADL/IADL/func mobility performance + decreased caregiver burden. ? ?Pt completed full-body dressing/bathing at mod I level with use of TTB and grab bars. Pt able to hold shower head in R hand and use LUE to bathe under LUE via hemi technique. Pt able to don B socks via 1 handed technique, as well and ind recalled how to execute with no cuing. Completed grooming tasks independently standing at sink.  ? ?Ambulated to and from gym with ind. Massed practice of tossing horse shoes into basket ~4 ft away with RUE. Pt able to loosely grasp horse shoes, although unable to release, relying on forearm pronation to drop into basket. ? ?Pt left semi-reclined in bed, call bell in reach, and all immediate needs met. Pt independent in room. ? ? ? ?Volanda Napoleon MS, OTR/L ? ?09/20/2021, 7:18 AM ?

## 2021-09-20 NOTE — Discharge Instructions (Addendum)
Inpatient Rehab Discharge Instructions ? ?Christopher [name] ? ?Discharge date and time: 09/21/2021  ? ?Activities/Precautions/ Functional Status: ? ?Activity: no lifting, driving, or strenuous exercise until cleared by MD ? ?Diet: cardiac diet ? ?Wound Care: none needed ? ?Functional status:  ?___ No restrictions     ___ Walk up steps independently ?___ 24/7 supervision/assistance   ___ Walk up steps with assistance ?__x_ Intermittent supervision/assistance  ___ Bathe/dress independently ?___ Walk with walker     ___ Bathe/dress with assistance ?___ Walk Independently    ___ Shower independently ?___ Walk with assistance    _x__ Shower with assistance ?__x_ No alcohol     ___ Return to work/school ________ ? ?Special Instructions: ? ?No driving, alcohol consumption or tobacco use.  ? ?Well cooked foods. No raw fruits or vegetables. Cut foods into small bite-sized pieces. ? ?COMMUNITY REFERRALS UPON DISCHARGE:   ? ?Outpatient: PT ?            Agency:[facility] [address] [place] [address] Phone:[phone]  ?            Appointment Date/Time:CALL TO SET UP APPOINTMENT ? ?Medical Equipment/Items Ordered:HAS ALL FROM PREVIOUS ADMIT ?                                                Agency/Supplier:NA ? ? ?STROKE/TIA DISCHARGE INSTRUCTIONS ?SMOKING Cigarette smoking nearly doubles your risk of having a stroke & is the single most alterable risk factor  ?If you smoke or have smoked in the last 12 months, you are advised to quit smoking for your health. Most of the excess cardiovascular risk related to smoking disappears within a year of stopping. ?Ask you doctor about anti-smoking medications ?[place] Quit Line: 1-800-QUIT NOW ?Free Smoking Cessation Classes [phone]  ?CHOLESTEROL Know your levels; limit fat & cholesterol in your diet  ?Lipid Panel  ?   ?Component Value Date/Time  ? CHOL 113 09/11/2021 0234  ? TRIG 94 09/11/2021 0234  ? HDL 26 (L) 09/11/2021 0234  ? CHOLHDL 4.3 09/11/2021 0234   ? VLDL 19 09/11/2021 0234  ? LDLCALC 68 09/11/2021 0234  ? ? ? Many patients benefit from treatment even if their cholesterol is at goal. ?Goal: Total Cholesterol (CHOL) less than 160 ?Goal:  Triglycerides (TRIG) less than 150 ?Goal:  HDL greater than 40 ?Goal:  LDL (LDLCALC) less than 100 ?  ?BLOOD PRESSURE American Stroke Association blood pressure target is less that 120/80 mm/Hg  ?Your discharge blood pressure is:  BP: 128/80 Monitor your blood pressure ?Limit your salt and alcohol intake ?Many individuals will require more than one medication for high blood pressure  ?DIABETES (A1c is a blood sugar average for last 3 months) Goal HGBA1c is under 7% (HBGA1c is blood sugar average for last 3 months)  ?Diabetes: ?No known diagnosis of diabetes   ? ?Lab Results  ?Component Value Date  ? HGBA1C 6.2 (H) 09/10/2021  ? ? Your HGBA1c can be lowered with medications, healthy diet, and exercise. ?Check your blood sugar as directed by your physician ?Call your physician if you experience unexplained or low blood sugars.  ?PHYSICAL ACTIVITY/REHABILITATION Goal is 30 minutes at least 4 days per week  ?Activity: Increase activity slowly, ?Therapies: Physical Therapy: Outpatient ?Return to work: when cleared by MD Activity decreases your risk of heart attack  and stroke and makes your heart stronger.  It helps control your weight and blood pressure; helps you relax and can improve your mood. ?Participate in a regular exercise program. ?Talk with your doctor about the best form of exercise for you (dancing, walking, swimming, cycling).  ?DIET/WEIGHT Goal is to maintain a healthy weight  ?Your discharge diet is:  ?Diet Order   ? ?       ?  DIET DYS 3 Room service appropriate? Yes with Assist; Fluid consistency: Thin  Diet effective now       ?  ? ?  ?  ? ?  ?  liquids ?Your height is:  Height: 5\' 9"  (175.3 cm) ?Your current weight is: Weight: 57.9 kg ?Your Body Mass Index (BMI) is:  BMI (Calculated): 18.84 Following the type of  diet specifically designed for you will help prevent another stroke. ?Your goal weight range is:   ?Your goal Body Mass Index (BMI) is 19-24. ?Healthy food habits can help reduce 3 risk factors for stroke:  High cholesterol, hypertension, and excess weight.  ?RESOURCES Stroke/Support Group:  Call [phone] ?  ?STROKE EDUCATION PROVIDED/REVIEWED AND GIVEN TO PATIENT Stroke warning signs and symptoms ?How to activate emergency medical system (call 911). ?Medications prescribed at discharge. ?Need for follow-up after discharge. ?Personal risk factors for stroke. ?Pneumonia vaccine given: No ?Flu vaccine given: No ?My questions have been answered, the writing is legible, and I understand these instructions.  I will adhere to these goals & educational materials that have been provided to me after my discharge from the hospital.  ? ?  ? ?My questions have been answered and I understand these instructions. I will adhere to these goals and the provided educational materials after my discharge from the hospital. ? ?Patient/Caregiver Signature _______________________________ Date __________ ? ?Clinician Signature _______________________________________ Date __________ ? ?Please bring this form and your medication list with you to all your follow-up doctor's appointments.   ?

## 2021-09-20 NOTE — Plan of Care (Signed)
?  Problem: RH Swallowing ?Goal: LTG Patient will consume least restrictive diet using compensatory strategies with assistance (SLP) ?Description: LTG:  Patient will consume least restrictive diet using compensatory strategies with assistance (SLP) ?Outcome: Completed/Met ?Goal: LTG Pt will demonstrate functional change in swallow as evidenced by bedside/clinical objective assessment (SLP) ?Description: LTG: Patient will demonstrate functional change in swallow as evidenced by bedside/clinical objective assessment (SLP) ?Outcome: Completed/Met ?  ?Problem: RH Expression Communication ?Goal: LTG Patient will express needs/wants via multi-modal(SLP) ?Description: LTG:  Patient will express needs/wants via multi-modal communication (gestures/written, etc) with cues (SLP) ?Outcome: Completed/Met ?Goal: LTG Patient will increase speech intelligibility (SLP) ?Description: LTG: Patient will increase speech intelligibility at word/phrase/conversation level with cues, % of the time (SLP) ?Outcome: Completed/Met ?Note: Pt exceeded goals and discharging at sup A level ?  ?

## 2021-09-20 NOTE — Progress Notes (Signed)
Inpatient Rehabilitation Care Coordinator ?Discharge Note  ? ?Patient Details  ?Name: Christopher [name] ?MRN: [id number] ?Date of Birth: December 23, 1965 ? ? ?Discharge location: Home ? ?Length of Stay: 7 Days ? ?Discharge activity level: Mod I/Sup ? ?Home/community participation: Patient sister ? ?Patient response XQ:JJHERD Literacy - How often do you need to have someone help you when you read instructions, pamphlets, or other written material from your doctor or pharmacy?: Often ? ?Patient response EY:CXKGYJ Isolation - How often do you feel lonely or isolated from those around you?: Never ? ?Services provided included: MD, RD, PT, OT, SLP, RN, CM, TR, Pharmacy, SW ? ?Financial Services:  ?Field seismologist Utilized: Other (Comment) (uninsured) ?  ? ?Choices offered to/list presented to: patient ? ?Follow-up services arranged:  ?Outpatient ?   ?Outpatient Servicies: OP at [facility] ?  ?  ? ?Patient response to transportation need: ?Is the patient able to respond to transportation needs?: Yes ?In the past 12 months, has lack of transportation kept you from medical appointments or from getting medications?: No ?In the past 12 months, has lack of transportation kept you from meetings, work, or from getting things needed for daily living?: No ? ? ? ?Comments (or additional information): ? ?Patient/Family verbalized understanding of follow-up arrangements:  Yes ? ?Individual responsible for coordination of the follow-up plan: Peggy [phone] ? ?Confirmed correct DME delivered: Andria Rhein 09/20/2021   ? ?Andria Rhein ?

## 2021-09-20 NOTE — Progress Notes (Signed)
Inpatient Rehabilitation Discharge Medication Review by a Pharmacist ? ?A complete drug regimen review was completed for this patient to identify any potential clinically significant medication issues. ? ?High Risk Drug Classes Is patient taking? Indication by Medication  ?Antipsychotic No   ?Anticoagulant No   ?Antibiotic No   ?Opioid No   ?Antiplatelet No   ?Hypoglycemics/insulin No   ?Vasoactive Medication No   ?Chemotherapy No   ?Other Yes Aspirin for CVA ppx ?Lipitor for HLD ?Protonix for GERD  ? ? ? ?Type of Medication Issue Identified Description of Issue Recommendation(s)  ?Drug Interaction(s) (clinically significant) ?    ?Duplicate Therapy ?    ?Allergy ?    ?No Medication Administration End Date ?    ?Incorrect Dose ?    ?Additional Drug Therapy Needed ?    ?Significant med changes from prior encounter (inform family/care partners about these prior to discharge). Lisinopril PTA - not resumed PCP appointment at discharge for follow-up. BP has been well controlled off med  ?Other ?    ? ? ?Clinically significant medication issues were identified that warrant physician communication and completion of prescribed/recommended actions by midnight of the next day:  No ? ? ?Time spent performing this drug regimen review (minutes): 30 minutes ? ? ?Haniah Penny BS, PharmD, BCPS ?Clinical Pharmacist ?09/21/2021 8:29 AM ? ?Contact: [phone] after 3 PM ? ?"Be curious, not judgmental..." -Debbora Dus ?

## 2021-09-20 NOTE — Progress Notes (Incomplete)
Physical Therapy Session Note ? ?Patient Details  ?Name: Christopher [name] ?MRN: [id number] ?Date of Birth: 23-Aug-1965 ? ?{[facility] IP REHAB PT TIME CALCULATION:[id number]} ? ?Short Term Goals: ?[id number] ? ?Skilled Therapeutic Interventions/Progress Updates:  ?   ? ?Therapy Documentation ?Precautions:  ?Precautions ?Precautions: Fall ?Precaution Comments: dysarthria, RUE hemiparesis ?Restrictions ?Weight Bearing Restrictions: No ?General: ?  ?Vital Signs: ?  ?Pain: ?Pain Assessment ?Pain Scale: 0-10 ?Pain Score: 0-No pain ?Mobility: ?  ?Locomotion : ?   ?Trunk/Postural Assessment : ?   ?Balance: ?  ?Exercises: ?  ?Other Treatments:   ? ? ? ?Therapy/Group: {Therapy/Group:[id number]} ? ?Alger Simons ?09/20/2021, 10:27 AM  ?

## 2021-09-20 NOTE — Progress Notes (Addendum)
Physical Therapy Discharge Summary ? ?Patient Details  ?Name: Christopher [name] ?MRN: [id number] ?Date of Birth: 11-10-1965 ? ?Today's Date: 09/20/2021 ?PT Individual Time: 0102-7253 ?PT Individual Time Calculation (min): 55 min  ? ? ?Patient has met 9 of 9 long term goals due to improved activity tolerance, improved balance, improved postural control, increased strength, decreased pain, functional use of  right lower extremity, improved attention, improved awareness, and improved coordination.  Patient to discharge at an ambulatory level Independent.   Patient's care partner is independent to provide the necessary physical and cognitive assistance at discharge. ? ?Reasons goals not met: n/a ? ?Recommendation:  ?Patient will not require ongoing skilled PT services at time of d/c d/t significant increases in strength, coordination, balance. If pt desires, he can continue PT services in OPPPT clinic specializing inneuro re-ed in order to advance safe functional mobility, prevent regression in mobility and balance, and to continue to minimize fall risk. ? ?Equipment: ?No equipment provided ? ?Reasons for discharge: treatment goals met and discharge from hospital ? ?Patient/family agrees with progress made and goals achieved: Yes ? ?PT Discharge ?Precautions/Restrictions ?Precautions ?Precautions: Fall ?Precaution Comments: dysarthria, RUE hemiparesis ?Restrictions ?Weight Bearing Restrictions: No ?Vital Signs ?Therapy Vitals ?Temp: 98.2 ?F (36.8 ?C) ?Temp Source: Oral ?Pulse Rate: 76 ?Resp: 14 ?BP: (!) 160/84 ?Patient Position (if appropriate): Lying ?Oxygen Therapy ?SpO2: 98 % ?O2 Device: Room Air ?Pain ?Pain Assessment ?Pain Scale: 0-10 ?Pain Score: 0-No pain ?Pain Interference ?Pain Interference ?Pain Effect on Sleep: 1. Rarely or not at all ?Pain Interference with Therapy Activities: 1. Rarely or not at all ?Pain Interference with Day-to-Day Activities: 1. Rarely or not at all ?Vision/Perception  ?Vision -  History ?Ability to See in Adequate Light: 0 Adequate ?Vision - Assessment ?Eye Alignment: Within Functional Limits ?Ocular Range of Motion: Within Functional Limits ?Alignment/Gaze Preference: Within Defined Limits ?Tracking/Visual Pursuits: Decreased smoothness of eye movement to LEFT superior field;Decreased smoothness of eye movement to LEFT inferior field ?Saccades: Decreased speed of saccadic movement ?Perception ?Perception: Within Functional Limits ?Praxis ?Praxis: Intact  ?Cognition ?Overall Cognitive Status: Within Functional Limits for tasks assessed ?Arousal/Alertness: Awake/alert ?Orientation Level: Oriented X4 ?Attention: Sustained ?Sustained Attention: Appears intact ?Memory: Appears intact ?Awareness: Appears intact ?Problem Solving: Appears intact ?Safety/Judgment: Appears intact ?Comments: Pt demonstrates good safety awareness and insight into deficits in busy gym environement. ?Sensation ?Sensation ?Light Touch: Appears Intact ?Hot/Cold: Appears Intact ?Proprioception: Appears Intact ?Stereognosis: Impaired Detail ?Stereognosis Impaired Details: Impaired RUE ?Coordination ?Gross Motor Movements are Fluid and Coordinated: Yes ?Fine Motor Movements are Fluid and Coordinated: No ?Coordination and Movement Description: RUE HP ?Heel Shin Test: [facility] ?Motor  ?Motor ?Motor: Other (comment) (hemipareisis) ?Motor - Discharge Observations: RUE hemiparesis  ?Mobility ?Bed Mobility ?Bed Mobility: Sit to Supine;Supine to Sit ?Supine to Sit: Independent ?Sit to Supine: Independent ?Transfers ?Transfers: Sit to Stand;Stand Pivot Transfers;Stand to Sit ?Sit to Stand: Independent ?Stand to Sit: Independent ?Stand Pivot Transfers: Independent ?Transfer (Assistive device): None ?Locomotion  ?Gait ?Ambulation: Yes ?Gait Assistance: Independent ?Gait Distance (Feet): 1200 Feet ?Assistive device: None ?Gait ?Gait: Yes ?Gait Pattern: Within Functional Limits ?Gait Pattern: Narrow base of support ?Gait velocity:  1.35 m/s ?Stairs / Additional Locomotion ?Stairs: Yes ?Stairs Assistance: Independent ?Stair Management Technique: No rails ?Number of Stairs: 16 ?Height of Stairs: 6 ?Ramp: Independent ?Curb: Independent ?Wheelchair Mobility ?Wheelchair Mobility: No  ?Trunk/Postural Assessment  ?Cervical Assessment ?Cervical Assessment: Within Functional Limits ?Thoracic Assessment ?Thoracic Assessment: Exceptions to [facility] (rounded shoulders) ?Lumbar Assessment ?Lumbar Assessment: Within Functional  Limits ?Postural Control ?Postural Control: Within Functional Limits  ?Balance ?Balance ?Balance Assessed: Yes ?Standardized Balance Assessment ?Standardized Balance Assessment: Merrilee Jansky Balance Test;Functional Gait Assessment ?Berg Balance Test ?Sit to Stand: Able to stand without using hands and stabilize independently ?Standing Unsupported: Able to stand safely 2 minutes ?Sitting with Back Unsupported but Feet Supported on Floor or Stool: Able to sit safely and securely 2 minutes ?Stand to Sit: Sits safely with minimal use of hands ?Transfers: Able to transfer safely, minor use of hands ?Standing Unsupported with Eyes Closed: Able to stand 10 seconds safely ?Standing Ubsupported with Feet Together: Able to place feet together independently and stand 1 minute safely ?From Standing, Reach Forward with Outstretched Arm: Can reach confidently >25 cm (10") ?From Standing Position, Pick up Object from Floor: Able to pick up shoe safely and easily ?From Standing Position, Turn to Look Behind Over each Shoulder: Looks behind from both sides and weight shifts well ?Turn 360 Degrees: Able to turn 360 degrees safely in 4 seconds or less ?Standing Unsupported, Alternately Place Feet on Step/Stool: Able to stand independently and complete 8 steps >20 seconds ?Standing Unsupported, One Foot in Front: Able to place foot tandem independently and hold 30 seconds ?Standing on One Leg: Able to lift leg independently and hold > 10 seconds ?Total Score:  55 ?Static Sitting Balance ?Static Sitting - Balance Support: Feet supported ?Static Sitting - Level of Assistance: 7: Independent ?Dynamic Sitting Balance ?Dynamic Sitting - Balance Support: Feet supported ?Dynamic Sitting - Level of Assistance: 7: Independent ?Static Standing Balance ?Static Standing - Balance Support: No upper extremity supported;During functional activity ?Static Standing - Level of Assistance: 7: Independent ?Dynamic Standing Balance ?Dynamic Standing - Balance Support: During functional activity;No upper extremity supported ?Dynamic Standing - Level of Assistance: 7: Independent ?Functional Gait  Assessment ?Gait assessed : Yes ?Gait Level Surface: Walks 20 ft in less than 5.5 sec, no assistive devices, good speed, no evidence for imbalance, normal gait pattern, deviates no more than 6 in outside of the 12 in walkway width. ?Change in Gait Speed: Able to smoothly change walking speed without loss of balance or gait deviation. Deviate no more than 6 in outside of the 12 in walkway width. ?Gait with Horizontal Head Turns: Performs head turns smoothly with no change in gait. Deviates no more than 6 in outside 12 in walkway width ?Gait with Vertical Head Turns: Performs head turns with no change in gait. Deviates no more than 6 in outside 12 in walkway width. ?Gait and Pivot Turn: Pivot turns safely within 3 sec and stops quickly with no loss of balance. ?Step Over Obstacle: Is able to step over one shoe box (4.5 in total height) without changing gait speed. No evidence of imbalance. ?Gait with Narrow Base of Support: Ambulates 7-9 steps. ?Gait with Eyes Closed: Walks 20 ft, uses assistive device, slower speed, mild gait deviations, deviates 6-10 in outside 12 in walkway width. Ambulates 20 ft in less than 9 sec but greater than 7 sec. ?Ambulating Backwards: Walks 20 ft, no assistive devices, good speed, no evidence for imbalance, normal gait ?Steps: Alternating feet, no rail. ?Total Score:  27 ?Extremity Assessment  ?  ?  ?RLE Assessment ?RLE Assessment: Within Functional Limits ?General Strength Comments: grossly 4+/5 to 5/5 proximal to distal ?LLE Assessment ?LLE Assessment: Within Functional Limits ?General Str

## 2021-09-20 NOTE — Plan of Care (Signed)
?  Problem: RH Balance ?Goal: LTG Patient will maintain dynamic standing with ADLs (OT) ?Description: LTG:  Patient will maintain dynamic standing balance with assist during activities of daily living (OT)  ?Outcome: Completed/Met ?  ?Problem: Sit to Stand ?Goal: LTG:  Patient will perform sit to stand in prep for activites of daily living with assistance level (OT) ?Description: LTG:  Patient will perform sit to stand in prep for activites of daily living with assistance level (OT) ?Outcome: Completed/Met ?  ?Problem: RH Eating ?Goal: LTG Patient will perform eating w/assist, cues/equip (OT) ?Description: LTG: Patient will perform eating with assist, with/without cues using equipment (OT) ?Outcome: Completed/Met ?  ?Problem: RH Grooming ?Goal: LTG Patient will perform grooming w/assist,cues/equip (OT) ?Description: LTG: Patient will perform grooming with assist, with/without cues using equipment (OT) ?Outcome: Completed/Met ?  ?Problem: RH Bathing ?Goal: LTG Patient will bathe all body parts with assist levels (OT) ?Description: LTG: Patient will bathe all body parts with assist levels (OT) ?Outcome: Completed/Met ?  ?Problem: RH Dressing ?Goal: LTG Patient will perform upper body dressing (OT) ?Description: LTG Patient will perform upper body dressing with assist, with/without cues (OT). ?Outcome: Completed/Met ?Goal: LTG Patient will perform lower body dressing w/assist (OT) ?Description: LTG: Patient will perform lower body dressing with assist, with/without cues in positioning using equipment (OT) ?Outcome: Completed/Met ?  ?Problem: RH Toileting ?Goal: LTG Patient will perform toileting task (3/3 steps) with assistance level (OT) ?Description: LTG: Patient will perform toileting task (3/3 steps) with assistance level (OT)  ?Outcome: Completed/Met ?  ?Problem: RH Functional Use of Upper Extremity ?Goal: LTG Patient will use RT/LT upper extremity as a (OT) ?Description: LTG: Patient will use right/left upper  extremity as a stabilizer/gross assist/diminished/nondominant/dominant level with assist, with/without cues during functional activity (OT) ?Outcome: Completed/Met ?  ?Problem: RH Simple Meal Prep ?Goal: LTG Patient will perform simple meal prep w/assist (OT) ?Description: LTG: Patient will perform simple meal prep with assistance, with/without cues (OT). ?Outcome: Completed/Met ?  ?Problem: RH Toilet Transfers ?Goal: LTG Patient will perform toilet transfers w/assist (OT) ?Description: LTG: Patient will perform toilet transfers with assist, with/without cues using equipment (OT) ?Outcome: Completed/Met ?  ?Problem: RH Tub/Shower Transfers ?Goal: LTG Patient will perform tub/shower transfers w/assist (OT) ?Description: LTG: Patient will perform tub/shower transfers with assist, with/without cues using equipment (OT) ?Outcome: Completed/Met ?  ?Problem: RH Furniture Transfers ?Goal: LTG Patient will perform furniture transfers w/assist (OT/PT) ?Description: LTG: Patient will perform furniture transfers  with assistance (OT/PT). ?Outcome: Completed/Met ?  ?Problem: RH Furniture Transfers ?Goal: LTG Patient will perform furniture transfers w/assist (OT/PT) ?Description: LTG: Patient will perform furniture transfers  with assistance (OT/PT). ?Outcome: Completed/Met ?  ?

## 2021-09-20 NOTE — Progress Notes (Addendum)
Speech Language Pathology Discharge Summary ? ?Patient Details  ?Name: Christopher [name] ?MRN: [id number] ?Date of Birth: 1965/10/28 ? ?Today's Date: 09/20/2021 ?SLP Individual Time: 0800-0900 ?SLP Individual Time Calculation (min): 60 min ? ?Skilled Therapeutic Interventions: Skilled ST treatment focused on speech and swallowing goals and education prior to discharge tomorrow. SLP contacted pt's sister by phone per pt's consent to educate re: speech strategies to enhance speech intelligibility, diet recommendations, swallowing precautions and strategies, and recommendations for OP SLP services to further address speech and swallow function. Provided extensive education regarding increased aspiration risk with thin liquids however this being minimized/eliminated with implementation of chin tuck. Also educated pt/sister re: caution with consuming mixed consistencies (watermelon, oranges, chunky soups, etc.) and to implement chin tuck with consumption of these textures. Sister verbalized understanding to all discussed topics through teach back and confirmed that her spouse (pt's brother-in-law) would be able to provide transportation to OP services. Also discussed sister providing support for medication management, as she did at prior level. ? ?Pt communicated at the sentence level with 75-90% intelligibility with mod I for use of speech strategies. Pt also mod I for use of gestures to further support verbal communication. Pt did not need to utilize dry erase board to support verbal communication this date. Patient consumed cup sips of thin liquids without overt s/sx of aspiration and mod I for consistent use of chin tuck position. Patient was left in bed with alarm activated and immediate needs within reach at end of session.  ? ?Patient has met 4 of 4 long term goals.  Patient to discharge at overall   level.  ?Reasons goals not met: All goals met  ? ?Clinical Impression/Discharge Summary: Patient has made excellent  gains and has met 3 of 3 long-term goals this admission due to improved speech intelligibility and tolerance of current diet with implementation of safe swallowing precautions to minimize aspiration risk. Pt is currently consuming a Dysphagia 3 diet with thin liquids and is mod I for implementation of chin tuck posture. Pt is perceived as ~75-90% intelligible at the sentence level with sup A cues for use of speech intelligibility strategies. Patient and family education is complete and patient to discharge at overall supervision level. Patient's care partner is independent to provide the necessary assist at discharge. Patient would benefit from continued SLP services in an outpatient setting to maximize speech and swallow function. ? ?Care Partner:  ?Caregiver Able to Provide Assistance: Yes  ?Type of Caregiver Assistance: Cognitive;Physical ?  ?Recommendation:  ?Outpatient SLP;Other (comment) (Intermittent supervision)  ?Rationale for SLP Follow Up: Maximize functional communication;Maximize swallowing safety;Reduce caregiver burden ? ?Equipment: None recommended  ? ?Reasons for discharge: Discharged from hospital;Treatment goals met  ? ?Patient/Family Agrees with Progress Made and Goals Achieved: Yes  ? ?Jansen Sciuto T Jeannine Pennisi ?09/20/2021, 12:46 PM ? ?

## 2021-09-21 ENCOUNTER — Other Ambulatory Visit (HOSPITAL_COMMUNITY): Payer: Self-pay

## 2021-09-21 MED ORDER — ACETAMINOPHEN 325 MG PO TABS: 325.0000 mg | ORAL_TABLET | ORAL | Status: AC | PRN

## 2021-09-21 MED ORDER — ASPIRIN 325 MG PO TABS
325.0000 mg | ORAL_TABLET | Freq: Every day | ORAL | 0 refills | Status: DC
  Filled 2021-09-21: qty 30, 30d supply, fill #0

## 2021-09-21 MED ORDER — OMEPRAZOLE 20 MG PO CPDR
20.0000 mg | DELAYED_RELEASE_CAPSULE | Freq: Every day | ORAL | 0 refills | Status: DC
  Filled 2021-09-21: qty 30, 30d supply, fill #0

## 2021-09-21 MED ORDER — ATORVASTATIN CALCIUM 20 MG PO TABS
20.0000 mg | ORAL_TABLET | Freq: Every day | ORAL | 1 refills | Status: DC
  Filled 2021-09-21: qty 30, 30d supply, fill #0

## 2021-09-21 NOTE — Progress Notes (Signed)
Anticipates discharge today, restful throughout shift no acute discomfort of complaint., monitored and assisted,Call bell within reach , bed alarm in monitoring mode for safety ?

## 2021-09-21 NOTE — Progress Notes (Signed)
Patient discharged to home in private vehicle with all personal belongings. Discharge summary reviewed with patient and family by Risa Grill - PA. Home medications provided to patient by TOC. Patient denies further questions or concerns at this time.  ?

## 2021-09-21 NOTE — Plan of Care (Signed)
?  Problem: RH Balance ?Goal: LTG Patient will maintain dynamic standing balance (PT) ?Description: LTG:  Patient will maintain dynamic standing balance with assistance during mobility activities (PT) ?Outcome: Completed/Met ?Flowsheets (Taken 09/20/2021 2356) ?LTG: Pt will maintain dynamic standing balance during mobility activities with:: Independent ?  ?Problem: RH Bed Mobility ?Goal: LTG Patient will perform bed mobility with assist (PT) ?Description: LTG: Patient will perform bed mobility with assistance, with/without cues (PT). ?Outcome: Completed/Met ?Flowsheets (Taken 09/20/2021 2356) ?LTG: Pt will perform bed mobility with assistance level of: Independent ?  ?Problem: RH Car Transfers ?Goal: LTG Patient will perform car transfers with assist (PT) ?Description: LTG: Patient will perform car transfers with assistance (PT). ?Outcome: Completed/Met ?Flowsheets (Taken 09/20/2021 2356) ?LTG: Pt will perform car transfers with assist:: Independent ?  ?Problem: RH Floor Transfers ?Goal: LTG Patient will perform floor transfers w/assist (PT) ?Description: LTG: Patient will perform floor transfers with assistance (PT). ?Outcome: Completed/Met ?Flowsheets (Taken 09/20/2021 2356) ?LTG: PT WILL PERFORM FLOOR TRANFERS  WITH  ASSIST:: Independent with assistive device  ?  ?Problem: RH Ambulation ?Goal: LTG Patient will ambulate in controlled environment (PT) ?Description: LTG: Patient will ambulate in a controlled environment, # of feet with assistance (PT). ?Outcome: Completed/Met ?Flowsheets (Taken 09/20/2021 2356) ?LTG: Pt will ambulate in controlled environ  assist needed:: Independent ?LTG: Ambulation distance in controlled environment: >1200 ft consistently ?Goal: LTG Patient will ambulate in home environment (PT) ?Description: LTG: Patient will ambulate in home environment, # of feet with assistance (PT). ?Outcome: Completed/Met ?Flowsheets ?Taken 09/20/2021 2356 by Alger Simons, PT ?LTG: Pt will ambulate in home  environ  assist needed:: Independent ?Taken 09/15/2021 1523 by Lorie Phenix, PT ?LTG: Ambulation distance in home environment: 14ft ?Goal: LTG Patient will ambulate in community environment (PT) ?Description: LTG: Patient will ambulate in community environment, # of feet with assistance (PT). ?Outcome: Completed/Met ?Flowsheets (Taken 09/20/2021 2356) ?LTG: Pt will ambulate in community environ  assist needed:: Independent ?LTG: Ambulation distance in community environment: >1200 ft consistently ?  ?Problem: RH Stairs ?Goal: LTG Patient will ambulate up and down stairs w/assist (PT) ?Description: LTG: Patient will ambulate up and down # of stairs with assistance (PT) ?Outcome: Completed/Met ?Flowsheets (Taken 09/20/2021 2356) ?LTG: Pt will ambulate up/down stairs assist needed:: Independent ?LTG: Pt will  ambulate up and down number of stairs: 16 steps with no HR use ?  ?Problem: RH Pre-functional/Other (Specify) ?Goal: RH LTG PT (Specify) 1 ?Description:  RH LTG PT (Specify) 1 ?Outcome: Completed/Met ?Flowsheets (Taken 09/15/2021 1523 by Lorie Phenix, PT) ?LTG: Other PT (Specify) 1: pt will increase FGA >22 to indicate reduced fall risk ?Note: Pt scored 27/ 30 ?  ?

## 2021-09-21 NOTE — Progress Notes (Signed)
?                                                       PROGRESS NOTE ? ? ?Subjective/Complaints: ? ? ?Discussed lifestyle issues, now lving with sister and brother in law, no cocaine since prior stroke, discussed smoking cessation\ ?Admits to 1 ETOH beverage per week  ?Discussed no driving  ? ?ROS- neg CP, SOB, N/V/D ? ?Objective: ?  ?No results found. ?Recent Labs  ?  09/19/21 ?0807  ?WBC 8.9  ?HGB 13.8  ?HCT 41.0  ?PLT 376  ? ? ?Recent Labs  ?  09/19/21 ?0807  ?NA 134*  ?K 4.5  ?CL 96*  ?CO2 30  ?GLUCOSE 112*  ?BUN 20  ?CREATININE 1.36*  ?CALCIUM 9.5  ? ? ? ?Intake/Output Summary (Last 24 hours) at 09/21/2021 0941 ?Last data filed at 09/21/2021 0753 ?Gross per 24 hour  ?Intake 1400 ml  ?Output 300 ml  ?Net 1100 ml  ? ?  ? ?  ? ?Physical Exam: ?Vital Signs ?Blood pressure 128/80, pulse 70, temperature 98.4 ?F (36.9 ?C), temperature source Oral, resp. rate 14, height 5\' 9"  (1.753 m), weight 57.9 kg, SpO2 96 %. ? ? ?General: No acute distress ?Mood and affect are appropriate ?Heart: Regular rate and rhythm no rubs murmurs or extra sounds ?Lungs: Clear to auscultation, breathing unlabored, no rales or wheezes ?Abdomen: Positive bowel sounds, soft nontender to palpation, nondistended ?Extremities: No clubbing, cyanosis, or edema ?Skin: No evidence of breakdown, no evidence of rash ?Neurologic: Cranial nerves II through XII intact except R central 7,  motor strength is 5/5 in left and 3- RIght deltoid, bicep, tricep, grip,5/5 Bilateral  hip flexor, knee extensors, ankle dorsiflexor and plantar flexor ? ? ?Skin: warm and dry ?Neuro:  Very dysarthric speech but is 75% inteliigible Answers with thumbs up or down or writing on board. Follows commands.  ?CN 2-12 intact ?Looking in all directions ? ? ?Assessment/Plan: ?1. Functional deficits which require 3+ hours per day of interdisciplinary therapy in a comprehensive inpatient rehab setting. ?Physiatrist is providing close team supervision and 24 hour management of active  medical problems listed below. ?Physiatrist and rehab team continue to assess barriers to discharge/monitor patient progress toward functional and medical goals ? ?Care Tool: ? ?Bathing ?   ?Body parts bathed by patient: Right arm, Chest, Abdomen, Front perineal area, Buttocks, Right upper leg, Left upper leg, Right lower leg, Face, Left lower leg, Left arm  ? Body parts bathed by helper: Left arm ?  ?  ?Bathing assist Assist Level: Independent with assistive device ?  ?  ?Upper Body Dressing/Undressing ?Upper body dressing   ?What is the patient wearing?: Pull over shirt ?   ?Upper body assist Assist Level: Independent ?   ?Lower Body Dressing/Undressing ?Lower body dressing ? ? ?   ?What is the patient wearing?: Pants ? ?  ? ?Lower body assist Assist for lower body dressing: Independent ?   ? ?Toileting ?Toileting Toileting Activity did not occur (Acupuncturist and hygiene only): N/A (no void or bm)  ?Toileting assist Assist for toileting: Independent ?  ?  ?Transfers ?Chair/bed transfer ? ?Transfers assist ?   ? ?Chair/bed transfer assist level: Independent with assistive device ?  ?  ?Locomotion ?Ambulation ? ? ?Ambulation assist ? ?   ? ?Assist level:  Independent ?Assistive device: No Device ?Max distance: >1200  ? ?Walk 10 feet activity ? ? ?Assist ?   ? ?Assist level: Independent ?Assistive device: No Device  ? ?Walk 50 feet activity ? ? ?Assist   ? ?Assist level: Independent ?Assistive device: No Device  ? ? ?Walk 150 feet activity ? ? ?Assist   ? ?Assist level: Independent ?Assistive device: No Device ?  ? ?Walk 10 feet on uneven surface  ?activity ? ? ?Assist Walk 10 feet on uneven surfaces activity did not occur: Refused ? ? ?Assist level: Independent ?Assistive device:  (No Device)  ? ?Wheelchair ? ? ? ? ?Assist Is the patient using a wheelchair?: No ?  ?Wheelchair activity did not occur: N/A ? ?  ?   ? ? ?Wheelchair 50 feet with 2 turns activity ? ? ? ?Assist ? ?  ?Wheelchair 50 feet with 2 turns  activity did not occur: N/A ? ? ?   ? ?Wheelchair 150 feet activity  ? ? ? ?Assist ? Wheelchair 150 feet activity did not occur: N/A ? ? ?   ? ?Blood pressure 128/80, pulse 70, temperature 98.4 ?F (36.9 ?C), temperature source Oral, resp. rate 14, height 5\' 9"  (1.753 m), weight 57.9 kg, SpO2 96 %. ?Medical Problem List and Plan: ?1. 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            -patient may shower ?            -ELOS/Goals:  d/c ihome with family today  ?No driving  ? Mod I to supervision  ? -PT/OT/SLP evaluations ? -intelligibility improving with SLP ?2.  Impaired mobility: continue Lovenox will start 40 mg daily ?            -antiplatelet therapy: aspirin 325 mg daily ?3. Pain Management: Tylenol,  Robaxin PRN ?4. Mood: LCSW to evaluate and provide emotional support ?            -antipsychotic agents: n/a ?5. Neuropsych: This patient is capable of making decisions on his own behalf. ?6. Skin/Wound Care: routine skin care checks ?7. Fluids/Electrolytes/Nutrition: routine Is and Os and follow-up chemistries ?8: Dysphagia: continue SLP, continues on dysphagia 2 diet with thin liquids ?9: Hypertension: no meds; monitor (lisinopril 20 mg at home) ?           -BP well controlled, continue to hold lisinopril- needs PCP f/u as OP  ?Vitals:  ? 09/20/21 2023 09/21/21 0441  ?BP: (!) 160/84 128/80  ?Pulse: 76 70  ?Resp: 14 14  ?Temp: 98.2 ?F (36.8 ?C) 98.4 ?F (36.9 ?C)  ?SpO2: 98% 96%  ? ? ?10: Hyperlipidemia: continue Atorvastatin 20 mg ?11: Prediabetes: glucose range is 95-115. Does not need CBG checks.  ?12: Polysubstance abuse: counseled on health risks and advised not to use ?            --tobacco use ?            --cocaine use ?            --alcohol use ?            --marijuana use ?13: Peptic ulcer disease: continue pantoprazole ?14: Left shoulder rotator cuff tendinitis>>Dr. Erlinda Hong as outpatient ?15: Acute kidney injury ?-creatinine improved from 2.11 to  1.29 ?-Labs tomorrow, encouraged fluids ?16: Mild leukocytosis, afebrile: monitor; follow-up CBC with diff ?  ? ?  Latest Ref Rng & Units 09/19/2021  ?  8:07 AM  09/15/2021  ?  5:34 AM 09/10/2021  ? 10:46 AM  ?CBC  ?WBC 4.0 - 10.5 K/uL 8.9   7.8   11.8    ?Hemoglobin 13.0 - 17.0 g/dL 13.8   12.4   13.9    ?Hematocrit 39.0 - 52.0 % 41.0   36.3   40.1    ?Platelets 150 - 400 K/uL 376   298   321    ? ? ?  ?  ? ?LOS: ?7 days ?A FACE TO FACE EVALUATION WAS PERFORMED ? ?Christopher [name] ?09/21/2021, 9:41 AM  ? ?  ?

## 2021-09-22 ENCOUNTER — Telehealth: Payer: Self-pay

## 2021-09-22 NOTE — Telephone Encounter (Signed)
Transition Care Management Follow-up Telephone Call  The patient answered the phone and then gave it to his sister, [name], to complete the call.  Date of discharge and from where: 09/21/2021 [facility] How have you been since you were released from the hospital? She said he is doing good. She noted that when he is speaking at times he will start to cry which she hopes will resolve after receiving more speech therapy Any questions or concerns? No  Items Reviewed: Did the pt receive and understand the discharge instructions provided? Yes  Medications obtained and verified? Yes - Peggy said he has all of his medications and she did not have any questions about the med regime.  Other? Yes  Any new allergies since your discharge? No  Dietary orders reviewed? Yes Do you have support at home? Yes  - he lives with his sister, [name], and her husband  Home Care and Equipment/Supplies: Were home health services ordered? no If so, what is the name of the agency? N/a  Has the agency set up a time to come to the patient's home? not applicable Were any new equipment or medical supplies ordered?  No What is the name of the medical supply agency? N/a Were you able to get the supplies/equipment? not applicable Do you have any questions related to the use of the equipment or supplies? No  He has been referred to outpatient therapy.  Functional Questionnaire: (I = Independent and D = Dependent) ADLs: independent with ambulation without an assistive device. His sister and brother in law assist with personal care and medication management  Follow up appointments reviewed:  PCP Hospital f/u appt confirmed? Yes  Scheduled to see Ricky Stabs, NP -  10/10/2021.  Specialist Hospital f/u appt confirmed? Yes  Scheduled to see PMR- 11/03/2021. No appointment scheduled with neurology yet Are transportation arrangements needed? No  If their condition worsens, is the pt aware to call PCP or go to the  Emergency Dept.? Yes Was the patient provided with contact information for the PCP's office or ED? Yes Was to pt encouraged to call back with questions or concerns? Yes

## 2021-09-28 ENCOUNTER — Ambulatory Visit: Payer: Medicaid Other | Admitting: Occupational Therapy

## 2021-09-28 ENCOUNTER — Encounter: Payer: Self-pay | Admitting: Physical Therapy

## 2021-09-28 ENCOUNTER — Ambulatory Visit: Payer: Medicaid Other | Attending: Physician Assistant | Admitting: Physical Therapy

## 2021-09-28 ENCOUNTER — Encounter: Payer: Self-pay | Admitting: Speech Pathology

## 2021-09-28 ENCOUNTER — Ambulatory Visit: Payer: Medicaid Other | Admitting: Speech Pathology

## 2021-09-28 VITALS — BP 144/75 | HR 81

## 2021-09-28 DIAGNOSIS — R1310 Dysphagia, unspecified: Secondary | ICD-10-CM | POA: Diagnosis present

## 2021-09-28 DIAGNOSIS — R278 Other lack of coordination: Secondary | ICD-10-CM | POA: Diagnosis present

## 2021-09-28 DIAGNOSIS — M25511 Pain in right shoulder: Secondary | ICD-10-CM | POA: Insufficient documentation

## 2021-09-28 DIAGNOSIS — R2689 Other abnormalities of gait and mobility: Secondary | ICD-10-CM | POA: Diagnosis not present

## 2021-09-28 DIAGNOSIS — M6281 Muscle weakness (generalized): Secondary | ICD-10-CM | POA: Diagnosis present

## 2021-09-28 DIAGNOSIS — I69353 Hemiplegia and hemiparesis following cerebral infarction affecting right non-dominant side: Secondary | ICD-10-CM | POA: Diagnosis present

## 2021-09-28 DIAGNOSIS — R471 Dysarthria and anarthria: Secondary | ICD-10-CM | POA: Insufficient documentation

## 2021-09-28 NOTE — Patient Instructions (Addendum)
Strategies:  Slow down  Loud - raise your volume  Over-articulate - say all sounds clearly, think big mouth movements  Pause - use shorter phrases and sentences    Ways You Can Compensate for Dysarthria  There are many strategies that can help you communicate effectively with dysarthria. The checked strategies may be the best for you.  For the Speaker: _____ Speak loudly and slowly. Concentrate on speaking clearly and loudly. Sometimes Speech Pathologists call this overarticulation. You can think about exaggerating your mouth movements. The extra effort will help slow your speech and help others to hear you better.  _____ Modify your environment. Make the environment quiet by turning off the TV and radio. Make sure the listener can see your face--this helps the listener to receive non-verbal cues.  _____ Ask the listener for help. Let the listener that it's okay to ask you to repeat and to tell you when they don't understand. _____ Provide a topic of conversation. Letting the listener know what you are talking about will help them to "fill in the gaps".  _____ Don't shift topics quickly. Use transitions.  _____ Get your listener's attention before talking. _____ Be careful with your tone of voice. Sometimes we use subtle shifts in tone to convey humor or sarcasm. Dysarthria can make it harder to use tones, so you may need to verbalize humor. _____ Use short and simple sentences. Short, simple sentences are easier to understand. You may also you predictable sentences to increase understanding.  _____ Avoid communicating over long distances.  _____ Use communication aids or alternate methods of communication. _____ Keep an alphabet board and use it to point to letters to spell hard to understand words.  _____ Use the communication board/book provided by your Speech-Language Pathologist. _____ Write down parts of your message using a whiteboard.  _____ Remember SLOB.  Slow, Loud,  Overarticulate, Breathe.   For the Listener: _____ Let the speaker know when you do not understand. In public you can even use a signal. _____ Repeat the part of the message that you have understood. Then the speaker does not have to say the entire message again. _____ Give prompts. Remind the speaker of their strategies. (For example: "Say it slower" or "Please say that louder.") _____ Remind the speaker to use their communication aid.  _____ Ask yes / no questions to help. _____ Acknowledge the communication failure and say you will try again later.  _____ Establish guidelines for facilitating communications with others. Sometimes you will be the "translator" for your loved one. Know if he or she would like to help with misunderstood part of the message, answering long questions, or ordering food in a restaurant. Talk ahead of time about how to handle upcoming situations.

## 2021-09-28 NOTE — Therapy (Signed)
OUTPATIENT SPEECH LANGUAGE PATHOLOGY EVALUATION   Patient Name: Christopher [name] MRN: [id number] DOB:[birth year], 56 y.o., male Today's Date: 09/28/2021  PCP: Christopher Mai, MD REFERRING PROVIDER: Lauraine Rinne, MD    Past Medical History:  Diagnosis Date   PUD (peptic ulcer disease) 2008   treated with meds   No past surgical history on file. Patient Active Problem List   Diagnosis Date Noted   Acute ischemic left MCA stroke ([place]) 09/14/2021   Pressure injury of skin 09/11/2021   Stroke ([place]) 09/10/2021   Gastroesophageal reflux disease without esophagitis 08/24/2021   History of CVA (cerebrovascular accident) 08/11/2021   Dyslipidemia 01/24/2021   Essential hypertension    Hyponatremia    Polysubstance abuse ([place])    Cerebral thrombosis with cerebral infarction 01/11/2021   Acute CVA (cerebrovascular accident) ([place]) 01/11/2021   Right sided weakness 01/10/2021   AKI (acute kidney injury) ([place]) 01/10/2021   Tobacco abuse 01/10/2021   Cocaine abuse ([place]) 01/10/2021   Leukocytosis 01/10/2021   Cellulitis and abscess of upper arm and forearm 11/13/2012   Episodic tobacco abuse 11/13/2012    ONSET DATE: 09/10/2021   REFERRING DIAG: [id number] Acute Ischemic 2 MCA Stroke  THERAPY DIAG:  Dysphagia, unspecified type  Dysarthria and anarthria  Rationale for Evaluation and Treatment Rehabilitation  SUBJECTIVE:   SUBJECTIVE STATEMENT: "I think my speech is getting better" Pt accompanied by: self  PERTINENT HISTORY: Christopher [name] is a 56 y.o. male who presented to the [facility] ED for acute onset of right side weakness and dysarthria. TNK given. PMHx HTN, HLD, CVA, PUD, hx of cocaine abuse (as recently as five days PTA) and current tobacco smoker.  CT head showed right occipital infarct, new since Sept but appears chronic; chronic bilateral basal ganglia infarcts.  Neuro MD suspects small left subcortical infarct. Pt underwent speech/swallow evals after admission in Sept for  CVA - WFL with no f/u recommended. Has supportive sister/friends.   PAIN:  Are you having pain? Yes: NPRS scale: 7/10 Pain location: upper R arm Aggravating factors: moving arm  FALLS: Has patient fallen in last 6 months?  Yes, Number of falls: 1, See PT evaluation for details  LIVING ENVIRONMENT: Lives with: lives with their family, sister and brother in law Lives in: House/apartment  PLOF:  Level of assistance: Independent with IADLs Employment: On disability, has filed for disability   PATIENT GOALS "go back to work"  OBJECTIVE:   DIAGNOSTIC FINDINGS: 09/11/2021 MR BRAIN WO CONTRAST IMPRESSION: 1. Acute posterior Left MCA, perirolandic infarct with possible petechial hemorrhage, but no malignant hemorrhagic transformation or mass effect.   2. Progressed right PCA territory ischemic disease since last year. And new right brainstem Wallerian degeneration. Underlying advanced chronic small vessel disease.  DYSPHAGIA EVALUATION: RECOMMENDATIONS FROM OBJECTIVE SWALLOW STUDY (MBSS/FEES):  09/12/2021 Objective swallow impairments: oropharyngeal dysphagia characterized by reduced labeal seal, reduced pharyngeal constriction, and a pharyngeal delay. Pharyngeal residue was partly facilitated by cervical osteophytes/degenerative changes. Residue was improved with secondary swallows, and eliminated with a liquid wash. He exhibited aspiration of thin liquids (PAS 7) and coughing was ineffective during instances of aspiration with thin liquids. Laryngeal invasion was eliminated or improved to PAS 2 with use of a chin tuck posture. Objective recommended compensations: dysphagia 2 diet, thin liquids, whole meds with puree, slow rate, small bites and sips, chin tuck, liquid wash   COGNITION: Overall cognitive status: Within functional limits for tasks assessed Comments: Pt reports no difficulties, attends independently. Appears witin functional limits and reports no  overt difficulties at home.  Recalls swallowing strategies IND.   ORAL MOTOR ASSESSMENT:   Impaired: Facial : Symmetry impaired: Impaired right, Suspect CN VII impairment Voice: Hoarse, Wet, Hypernasal  CLINICAL SWALLOW ASSESSMENT:   Current diet: Dysphagia 3 (mechanical soft) and thin liquids Dentition: edentulous Patient directly observed with POs: Yes: thin liquids  Feeding: able to feed self Liquids provided by: bottle Oral phase signs and symptoms: n/a Pharyngeal phase signs and symptoms: none, pt IND using chin tuck strategy for all bolus trials  MOTOR SPEECH: Overall motor speech: impaired Level of impairment: Word, Phrase, Sentence, and Conversation Respiration: thoracic breathing Phonation: hoarse and low vocal intensity Resonance: hypernasality Articulation: Impaired: word, phrase, sentence, and conversation Intelligibility: Intelligibility reduced Motor planning: Appears intact Motor speech errors: aware Interfering components: inadequate dentition Effective technique: slow rate, over articulate, and pause Comments: 50% intelligible at sentence level, increasing to 75% with cues for strategy usage.     PATIENT REPORTED OUTCOME MEASURES (PROM): QOL-Dys  45   TODAY'S TREATMENT:  Education on compensations and strategies to use to enhance clarity of speech. Answered pt's questions regarding dysarthria. Review swallow compensations, pt able to teach back. Provided materials for home practice.    PATIENT EDUCATION: Education details: see above Person educated: Patient Education method: Explanation, Demonstration, and Handouts Education comprehension: verbalized understanding and returned demonstration   ASSESSMENT:  CLINICAL IMPRESSION: Patient is a 56 y.o. M who was seen today for ST evaluation following stroke. Reduced intelligibility throughout session, characterized by imprecise articulation and hypernasality. Stimulable for improved clarity of speech using slow, loud, over-articulate,  pause strategies. Requires cueing in extended utterances. Overall, pt reports he feels his speech is improving and reports to extensive home practice. Feels he understanding his strategies. Reviewed swallowing compensations. Pt IND demonstrates chin tuck with all liquid boluses this date. No overt s/sx of aspirations observed with use of targeted strategy. Recommend pt continues strict adherence to precautions and strategies. Pt does not wish to have speech therapy at this time to address dysphagia or dysarthria, despite ST recommendation of therapy course. Reports primary concern at this time is his arm and has limited financial resources.   OBJECTIVE IMPAIRMENTS include dysarthria and dysphagia. These impairments are limiting patient from effectively communicating at home and in community and safety when swallowing. Factors affecting potential to achieve goals and functional outcome are severity of impairments and financial resources. Patient will benefit from skilled SLP services to address above impairments and improve overall function.  REHAB POTENTIAL: Fair     PLAN: SLP FREQUENCY: one time visit  SLP DURATION: other: eval and discharge  PLANNED INTERVENTIONS: Aspiration precaution training, SLP instruction and feedback, Compensatory strategies, and Patient/family education    Su Monks, [place] 09/28/2021, 2:40 PM

## 2021-09-28 NOTE — Therapy (Signed)
OUTPATIENT PHYSICAL THERAPY NEURO EVALUATION   Patient Name: Christopher [name] MRN: [id number] DOB:12-11-1965, 56 y.o., male Today's Date: 09/28/2021   PCP: Dorna Mai, MD REFERRING PROVIDER: Cathlyn Parsons, PA-C    PT End of Session - 09/28/21 1413     Visit Number 1    Authorization Type MEDICAID POTENTIAL    PT Start Time [id number]    PT Stop Time 1430    PT Time Calculation (min) 27 min    Equipment Utilized During Treatment Gait belt    Activity Tolerance Patient tolerated treatment well    Behavior During Therapy WFL for tasks assessed/performed             Past Medical History:  Diagnosis Date   PUD (peptic ulcer disease) 2008   treated with meds   History reviewed. No pertinent surgical history. Patient Active Problem List   Diagnosis Date Noted   Acute ischemic left MCA stroke ([place]) 09/14/2021   Pressure injury of skin 09/11/2021   Stroke ([place]) 09/10/2021   Gastroesophageal reflux disease without esophagitis 08/24/2021   History of CVA (cerebrovascular accident) 08/11/2021   Dyslipidemia 01/24/2021   Essential hypertension    Hyponatremia    Polysubstance abuse ([place])    Cerebral thrombosis with cerebral infarction 01/11/2021   Acute CVA (cerebrovascular accident) ([place]) 01/11/2021   Right sided weakness 01/10/2021   AKI (acute kidney injury) ([place]) 01/10/2021   Tobacco abuse 01/10/2021   Cocaine abuse ([place]) 01/10/2021   Leukocytosis 01/10/2021   Cellulitis and abscess of upper arm and forearm 11/13/2012   Episodic tobacco abuse 11/13/2012    ONSET DATE: 09/21/2021 (referral)  REFERRING DIAG: [id number] (ICD-10-CM) - Cerebral infarction due to unspecified occlusion or stenosis of left middle cerebral artery   THERAPY DIAG:  Other abnormalities of gait and mobility - Plan: PT plan of care cert/re-cert  Muscle weakness (generalized) - Plan: PT plan of care cert/re-cert  Rationale for Evaluation and Treatment Rehabilitation  SUBJECTIVE:                                                                                                                                                                                               SUBJECTIVE STATEMENT: "I'm here to get my arm fixed.  I'm walking fine." Pt accompanied by: self and brother-in-law drove him  PERTINENT HISTORY: HTN, left MCA CVA 09/2021, GERD, prior CVA 01/10/2021, cocaine and THC use. From time of CVA in September: CT showed left thalamic, left caudate head and right BG age-indeterminate infarcts.  MRI showed right BG acute infarct. Completed inpatient rehab (admitted 01/12/21).  Applied for Medicaid while in  hospital, awaiting approval.    PAIN:  Are you having pain? Yes: NPRS scale: 7/10 Pain location: right shoulder Pain description: achy Aggravating factors: when holding up Relieving factors: unsure  PRECAUTIONS: Fall  WEIGHT BEARING RESTRICTIONS No  FALLS: Has patient fallen in last 6 months? Yes. Number of falls 1-"I was messing around and just fell"  LIVING ENVIRONMENT: Lives with: lives with their family and sister and brother-in-law Lives in: Mobile home Stairs: Yes: External: 2 steps; on right going up Has following equipment at home: Single point cane and shower chair  PLOF: Needs assistance with ADLs and Needs assistance with homemaking-independent with transfers and bathroom management; cannot use RUE to cook or clean and intermittently with dressing depending on the clothes  PATIENT GOALS "To fix my hand."  OBJECTIVE:   DIAGNOSTIC FINDINGS: From Head CT 09/10/2021:  A right occipital infarct is new since September but appears chronic. No acute cortically based infarct or acute intracranial hemorrhage identified.  Underlying advanced small vessel disease appears stable since September.  COGNITION: Overall cognitive status: Within functional limits for tasks assessed   SENSATION: WFL  COORDINATION: Heel-to-shin:  WFL  EDEMA:  None noted on  BLE  MUSCLE TONE: None noted on BLE  POSTURE: No Significant postural limitations  LOWER EXTREMITY ROM:     Active  Right Eval Left Eval  Hip flexion [facility]  Hip extension    Hip abduction    Hip adduction    Hip internal rotation    Hip external rotation    Knee flexion    Knee extension    Ankle dorsiflexion    Ankle plantarflexion    Ankle inversion    Ankle eversion     (Blank rows = not tested)  LOWER EXTREMITY MMT:    MMT Right Eval Left Eval  Hip flexion Grossly 5/5 Grossly 5/5  Hip extension    Hip abduction    Hip adduction    Hip internal rotation    Hip external rotation    Knee flexion " "  Knee extension    Ankle dorsiflexion    Ankle plantarflexion    Ankle inversion    Ankle eversion    (Blank rows = not tested)  BED MOBILITY:  Independent per pt.  TRANSFERS: Assistive device utilized: None  Sit to stand: Complete Independence Stand to sit: Complete Independence Chair to chair: Complete Independence   STAIRS:  Level of Assistance: Complete Independence  Stair Negotiation Technique: Alternating Pattern  Forwards with No Rails  Number of Stairs: 4   Height of Stairs: 6"  Comments:   GAIT: Gait pattern: WFL and step through pattern Distance walked: 160' Assistive device utilized: None Level of assistance: Complete Independence Comments: Good speed, large step length that is even bilaterally, no evidence of instability on level surface.  FUNCTIONAL TESTs:  10 meter walk test: 9.46sec no AD = 1.06 m/sec OR 3.49 ft/sec Functional gait assessment: 28/30  [facility] PT Assessment - 09/28/21 1508       Functional Gait  Assessment   Gait assessed  Yes    Gait Level Surface Walks 20 ft in less than 5.5 sec, no assistive devices, good speed, no evidence for imbalance, normal gait pattern, deviates no more than 6 in outside of the 12 in walkway width.    Change in Gait Speed Able to smoothly change walking speed without loss of balance or  gait deviation. Deviate no more than 6 in outside of the 12 in walkway width.  Gait with Horizontal Head Turns Performs head turns smoothly with slight change in gait velocity (eg, minor disruption to smooth gait path), deviates 6-10 in outside 12 in walkway width, or uses an assistive device.    Gait with Vertical Head Turns Performs head turns with no change in gait. Deviates no more than 6 in outside 12 in walkway width.    Gait and Pivot Turn Pivot turns safely within 3 sec and stops quickly with no loss of balance.    Step Over Obstacle Is able to step over 2 stacked shoe boxes taped together (9 in total height) without changing gait speed. No evidence of imbalance.    Gait with Narrow Base of Support Is able to ambulate for 10 steps heel to toe with no staggering.    Gait with Eyes Closed Walks 20 ft, uses assistive device, slower speed, mild gait deviations, deviates 6-10 in outside 12 in walkway width. Ambulates 20 ft in less than 9 sec but greater than 7 sec.    Ambulating Backwards Walks 20 ft, no assistive devices, good speed, no evidence for imbalance, normal gait    Steps Alternating feet, no rail.    Total Score 28            PATIENT SURVEYS:  FOTO not assessed.  PATIENT EDUCATION: Education details: PT POC and assessments used. Person educated: Patient Education method: Explanation Education comprehension: verbalized understanding    ASSESSMENT:  CLINICAL IMPRESSION: Patient is a 56 y.o. male who was seen today for physical therapy evaluation and treatment for left MCA CVA.  Pt has a significant PMH of HTN, left MCA CVA 09/10/2021, GERD, prior CVA 01/10/2021, cocaine and THC use.  Pt has had singular fall in 6 months suggesting possible recurrence of falls.  However, his fall risk is decreased as reflected by his FGA score and his gait speed.  He is at a stable current functional level with his balance, ambulation, and functional transfer needs without need for PT services  at this time.  CLINICAL DECISION MAKING: Stable/uncomplicated  EVALUATION COMPLEXITY: Low  Bary Richard, PT, DPT 09/28/2021, 3:13 PM

## 2021-09-28 NOTE — Therapy (Signed)
OUTPATIENT OCCUPATIONAL THERAPY NEURO EVALUATION  Patient Name: Christopher [name] MRN: [id number] DOB:01-Nov-1965, 56 y.o., male Today's Date: 09/28/2021  PCP: Dorna Mai, MD REFERRING PROVIDER: Cathlyn Parsons, PA-C    OT End of Session - 09/28/21 1450     Visit Number 1    Number of Visits 17    Date for OT Re-Evaluation 11/26/21    Authorization Type MCD potential - currently self pay    OT Start Time 1230    OT Stop Time 1315    OT Time Calculation (min) 45 min    Activity Tolerance Patient tolerated treatment well    Behavior During Therapy [facility] for tasks assessed/performed             Past Medical History:  Diagnosis Date   PUD (peptic ulcer disease) 2008   treated with meds   No past surgical history on file. Patient Active Problem List   Diagnosis Date Noted   Acute ischemic left MCA stroke ([place]) 09/14/2021   Pressure injury of skin 09/11/2021   Stroke ([place]) 09/10/2021   Gastroesophageal reflux disease without esophagitis 08/24/2021   History of CVA (cerebrovascular accident) 08/11/2021   Dyslipidemia 01/24/2021   Essential hypertension    Hyponatremia    Polysubstance abuse ([place])    Cerebral thrombosis with cerebral infarction 01/11/2021   Acute CVA (cerebrovascular accident) ([place]) 01/11/2021   Right sided weakness 01/10/2021   AKI (acute kidney injury) ([place]) 01/10/2021   Tobacco abuse 01/10/2021   Cocaine abuse ([place]) 01/10/2021   Leukocytosis 01/10/2021   Cellulitis and abscess of upper arm and forearm 11/13/2012   Episodic tobacco abuse 11/13/2012    ONSET DATE: 09/10/21  REFERRING DIAG: [id number] (ICD-10-CM) - Cerebral infarction due to unspecified occlusion or stenosis of left middle cerebral artery   THERAPY DIAG:  Hemiplegia and hemiparesis following cerebral infarction affecting right non-dominant side (HCC)  Other lack of coordination  Acute pain of right shoulder  Rationale for Evaluation and Treatment Rehabilitation  SUBJECTIVE:    SUBJECTIVE STATEMENT: I had a stroke in 2022 as well that effected my Lt side, but recovered pretty well Pt accompanied by: self  PERTINENT HISTORY: 56 y.o. male presents to [facility] hospital on 09/10/2021 with acute R weakenss and slurred speech. CT head negative. Pt received TNK. MRI demonstrates posterior L MCA infarct. PMH includes HTN, HLD, CVA, PUD, hx of cocaine abuse.  PRECAUTIONS: Other: no driving  WEIGHT BEARING RESTRICTIONS No  PAIN:  Are you having pain? Yes: NPRS scale: 5-6/10 Pain location: Rt shoulder/arm Pain description: achy Aggravating factors: movement, malpositioning Relieving factors: rest    FALLS: Has patient fallen in last 6 months? Yes. Number of falls 1  LIVING ENVIRONMENT: Lives with: lives with their family (sister and brother-n-law) since CVA in September 2022 Lives in: Mobile home, 2 steps to enter Stairs: Has following equipment at home: Lobbyist  PLOF: Independent with basic ADLs, unemployed (filing for disability)   PATIENT GOALs: work on Rt arm, get my voice back clearer  OBJECTIVE:   HAND DOMINANCE: Left  ADLs: Eating: mod I (on soft foods), assist only for cutting food prn Grooming: mod I Lt hand, uses mouthwash (only has 2 lower teeth) UB Dressing: mod I  LB Dressing: elastic pants, assist to tie shoes Toileting: mod I  Bathing: min assist, shower chair Tub Shower transfers: supervision Equipment: Shower seat with back   IADLs: Shopping: dependent  Light housekeeping: dependent Meal Prep: dependent Community mobility: relies on family for  transportation since Sept Medication management: sister manages Financial management: dep Handwriting: 90% legible and print only (partly due to previous stroke and education level)   MOBILITY STATUS: Independent   UPPER EXTREMITY ROM   LUE AROM WFL's, RUE Shoulder see below w/ no distal control at that height. Rt hand: Full composite flex approx 75-90%, full composite extension  90%  Active ROM Right eval Left eval  Shoulder flexion 120   Shoulder abduction 95   Shoulder adduction    Shoulder extension    Shoulder internal rotation 50%   Shoulder external rotation 75%   Elbow flexion WFL'S   Elbow extension WFL'S   Wrist flexion    Wrist extension 50%   Wrist ulnar deviation    Wrist radial deviation    Wrist pronation WFL'S   Wrist supination WFLS   (Blank rows = not tested)    HAND FUNCTION: Grip strength: Right: 14.3 lbs; Left: 75.8 lbs  COORDINATION: 9 Hole Peg test: Right: unable  Box and Blocks:  Right 5 blocks SENSATION: Light touch: Impaired - can detect and localize stimuli but diminished Rt hand  EDEMA: mild Rt hand  MUSCLE TONE: RUE: Hypotonic  COGNITION: Overall cognitive status: Within functional limits for tasks assessed  VISION: Subjective report: wears reading glasses Baseline vision: Wears glasses all the time and reports vision effected from drug abuse Visual history:  none  VISION ASSESSMENT: Not tested - no changes reported from stroke   PERCEPTION: Not tested  PRAXIS: Not tested  OBSERVATIONS: pt dysarthric, pt unkept, dirty hands/fingernails   TODAY'S TREATMENT:  Evaluation only today   PATIENT EDUCATION: Education details: OT POC Person educated: Patient Education method: Explanation Education comprehension: verbalized understanding   HOME EXERCISE PROGRAM: Not yet addressed    GOALS: Goals reviewed with patient? Yes  SHORT TERM GOALS: Target date: 10/28/21   Pt will be independent with HEP targeting RUE shoulder and Rt hand coordination  Baseline: not yet addressed Goal status: INITIAL  2.  Pt will improve RUE function as evidenced by performing Box & Blocks to 15 blocks Baseline: 5 blocks Goal status: INITIAL  3.  Pt will report pain Rt shoulder less than or equal to 3/10 with high level reaching Baseline: 5-6/10 Goal status: INITIAL  4.  Pt will improve Rt hand grip strength to  24 lbs or greater Baseline: 14.3 lbs Goal status: INITIAL  5.  Pt to verbalize understanding with safety and compensatory strategies for lack of sensation Rt hand Baseline: not yet addressed Goal status: INITIAL  6.  Pt to verbalize understanding for task modifications and A/E needs to increase independence with ADLS (rocker knife, shoe buttons, etc)  Baseline: not yet addressed Goal status: INITIAL  LONG TERM GOALS: Target date: 11/26/21  Independent with updated HEP PRN Baseline: Not yet addressed Goal status: INITIAL  2.  Pt to improve Rt hand function as evidenced by performing 9 hole peg test in under 2 minutes Baseline: unable at evaluation Goal status: INITIAL  3.  Pt to return to performing snack/sandwich/microwaveable items at mod I level Baseline: dependent Goal status: INITIAL  4.  Pt to return to light IADLS (making bed, folding laundry, etc) safely Baseline: dependent Goal status: INITIAL  5.  Pt to improve RUE function as evidenced by performing 25 blocks or Box & Blocks test Baseline: 5 blocks Goal status: INITIAL  6.  Pt to use Lt hand as assist for bilateral tasks 50% of time Baseline: unable Goal status: INITIAL  ASSESSMENT:  CLINICAL IMPRESSION: Patient is a 56 y.o. male who was seen today for occupational therapy evaluation s/p Lt MCA infarct (stroke) on 09/10/21 with residual Rt side weakness and slurred speech. Pt would benefit from skilled O.T. to address RUE weakness, pain, decreased sensation and coordination, and return pt to safely and I'ly performing ADLS to reduce caregiver burden of care.   PERFORMANCE DEFICITS in functional skills including ADLs, IADLs, coordination, dexterity, proprioception, sensation, edema, tone, ROM, strength, pain, FMC, body mechanics, decreased knowledge of use of DME, and UE functional use,   IMPAIRMENTS are limiting patient from ADLs and IADLs.   COMORBIDITIES may have co-morbidities  that affects occupational  performance. Patient will benefit from skilled OT to address above impairments and improve overall function.  MODIFICATION OR ASSISTANCE TO COMPLETE EVALUATION: No modification of tasks or assist necessary to complete an evaluation.  OT OCCUPATIONAL PROFILE AND HISTORY: Problem focused assessment: Including review of records relating to presenting problem.  CLINICAL DECISION MAKING: Moderate - several treatment options, min-mod task modification necessary  REHAB POTENTIAL: Good  EVALUATION COMPLEXITY: Low    PLAN: OT FREQUENCY: 2x/week  OT DURATION: 8 weeks  PLANNED INTERVENTIONS: self care/ADL training, therapeutic exercise, therapeutic activity, neuromuscular re-education, manual therapy, passive range of motion, functional mobility training, splinting, electrical stimulation, moist heat, cryotherapy, patient/family education, energy conservation, coping strategies training, and DME and/or AE instructions  RECOMMENDED OTHER SERVICES: none  CONSULTED AND AGREED WITH PLAN OF CARE: Patient  PLAN FOR NEXT SESSION: issue HEP for Rt hand ROM and simple coordination, and Rt shoulder ex's   Hans Eden, OT 09/28/2021, 2:52 PM

## 2021-10-04 ENCOUNTER — Ambulatory Visit: Payer: Medicaid Other | Admitting: Occupational Therapy

## 2021-10-05 NOTE — Progress Notes (Signed)
TRANSITION OF CARE VISIT       [facility] [address] [place]   [place],  [place]  [address]     Phone: [phone]      Fax: [phone]     Date of Admission: 09/14/2021  Date of Discharge: 09/21/2021  Transitions of Care Call: 09/22/2021  Discharged from: [facility]  Discharge Diagnosis:  Principal Problem:   Acute ischemic left MCA stroke [facility]) Active problems: Deficits secondary to left MCA stroke Dysphagia Aphasia Hypertension Hyperlipidemia Prediabetes Polysubstance abuse Peptic ulcer disease Oral Candidiasis  Summary of Admission per MD note:  Brief HPI:   Christopher [name] is a 56 y.o. male presented to ED with right arm weakness and garbled speech. Received tenecteplase. MRI consistent with left MCA and perirolandic infarct likely secondary to arthrosclerosis and cocaine use. RUE weakness and aphasia persists.     Hospital Course: ANGAD ROESEL was admitted to rehab 09/14/2021 for inpatient therapies to consist of PT, ST and OT at least three hours five days a week. Past admission physiatrist, therapy team and rehab RN have worked together to provide customized collaborative inpatient rehab. Serum creatinine trending downward, increased to 1.36 on 5/15. Leukocytosis resolved. Glucose range 95-115 and CBGs discontinued. Advanced to D3 diet, thin liquids. Patient and family members counseled regarding food preparation and limiting distractions.  Advised regarding tobacco and alcohol cessation and importance of provider follow-up. No driving emphasized.   Blood pressures were monitored on TID basis and remained controlled off lisinopril.    Rehab course: During patient's stay in rehab weekly team conferences were held to monitor patient's progress, set goals and discuss barriers to discharge. At admission, patient required   He has had improvement in activity  tolerance, balance, postural control as well as ability to compensate for deficits. He has had improvement in functional use RUE/LUE  and RLE/LLE as well as improvement in awareness. Significant improvement in expressive aphasia.   Disposition: Home Discharge disposition: 01-Home or Self Care  Follow-Ups Follow up with Dorna Mai, MD (Family Medicine); Call tomorrow to make arrangements for hospital follow-up Call [place]; Call tomorrow to make arrangements for hospital follow-up appointment Follow up with Kirsteins, Luanna Salk, MD (Physical Medicine and Rehabilitation); office will call you to arrange your appt (sent)   Today's visit: - Patient presents unaccompanied but states has someone waiting for him in the lobby. He is still living with his sister [name] and brother-in-law. - Reports feels improved since hospital discharge. Reports speech getting back to normal. Taking Acetaminophen, Aspirin, Atorvastatin, and Omeprazole as prescribed. Confirms he is not taking Lisinopril as instructed at hospital discharge.  - Denies current use of cocaine and alcohol.  - Smoking 1 pack of cigarettes every 2 days.  - Has first appointment with OT/rehabilitation on tomorrow. He is ambulating without an assistive device.  - Reports he already has a neurologist. Reports when he gets home he will call our office back with neurologist contact information.   Patient/Caregiver self-reported problems/concerns:  see above  MEDICATIONS  Medication Reconciliation conducted with patient/caregiver? (Yes/ No): Yes  New medications prescribed/discontinued upon discharge? (Yes/No): Yes  Barriers identified related to medications: No  LABS  Lab Reviewed (Yes/No/NA): Yes  PHYSICAL EXAM:  Physical Exam HENT:     Head: Normocephalic and atraumatic.  Eyes:     Extraocular Movements: Extraocular movements intact.     Conjunctiva/sclera: Conjunctivae normal.     Pupils: Pupils are  equal, round, and reactive to light.  Cardiovascular:     Rate and Rhythm: Normal rate and regular rhythm.     Pulses: Normal pulses.     Heart sounds: Normal heart sounds.  Pulmonary:     Effort: Pulmonary effort is normal.     Breath sounds: Normal breath sounds.  Musculoskeletal:     Right shoulder: Normal.     Left shoulder: Normal.     Right upper arm: Normal.     Left upper arm: Normal.     Right elbow: Normal.     Left elbow: Normal.     Right forearm: Normal.     Left forearm: Normal.     Left wrist: Normal.     Right hand: Swelling present. Decreased strength.     Left hand: Normal.     Cervical back: Normal range of motion and neck supple.     Right knee: Normal.     Left knee: Normal.     Right lower leg: Normal.     Left lower leg: Normal.     Right ankle: Normal.     Left ankle: Normal.  Neurological:     General: No focal deficit present.     Mental Status: He is alert and oriented to person, place, and time.     Gait: Gait is intact.  Psychiatric:        Attention and Perception: Attention normal.        Mood and Affect: Mood normal.        Speech: Speech is slurred.        Behavior: Behavior normal. Behavior is cooperative.        Thought Content: Thought content normal.        Cognition and Memory: Cognition and memory normal.        Judgment: Judgment normal.    ASSESSMENT AND PLAN: 1. Hospital discharge follow-up - Reviewed hospital course, current medications, ensured proper follow-up in place, and addressed concerns.   2. Acute ischemic left MCA stroke ([place]) 3. Dysphagia, unspecified type 4. Aphasia - Continue Aspirin as prescribed. - Keep all scheduled appointments with OT / [place] all scheduled appointments with Physical Medicine and Rehabilitation. - Referral to Neurology for further evaluation and management.  - Ambulatory referral to Neurology - aspirin 325 MG tablet; Take 1 tablet (325 mg total) by mouth daily.   Dispense: 90 tablet; Refill: 0  5. Essential hypertension - Blood pressure at goal today.  - Lisinopril discontinued at hospital discharge.  - Follow-up with primary provider in 3 months or sooner if needed.   6. Hyperlipidemia, unspecified hyperlipidemia type - Continue Atorvastatin as prescribed. No refills needed as of present.  - Follow-up with primary provider in 3 months or sooner if needed.  7. Prediabetes - Hemoglobin A1c 6.2% on 09/10/2021. - No medication needed as of present.  - Recheck in 5 months.   8. Polysubstance abuse ([place]) - Patient reports no longer consuming cocaine or alcohol. - Counseled to quit smoking cigarettes.  Discussed health risk associated with smoking.  9. Peptic ulcer disease - Continue Omeprazole as prescribed. - Follow-up with primary provider in 3 months or sooner if needed. - omeprazole (PRILOSEC) 20 MG capsule; Take 1 capsule (20 mg total) by mouth daily.  Dispense: 90 capsule; Refill: 0   PATIENT EDUCATION PROVIDED: See AVS   FOLLOW-UP (Include any further testing or referrals):  - Keep all scheduled appointment with OT / Rehabilitation.  -  Keep all scheduled appointments with Physical Medicine Rehabilitation. - Referral to Neurology.  - Follow-up with primary provider in 3 months or sooner if needed.

## 2021-10-10 ENCOUNTER — Ambulatory Visit (INDEPENDENT_AMBULATORY_CARE_PROVIDER_SITE_OTHER): Payer: Medicaid Other | Admitting: Family

## 2021-10-10 ENCOUNTER — Encounter: Payer: Self-pay | Admitting: Family

## 2021-10-10 VITALS — BP 139/65 | HR 91 | Temp 98.3°F | Resp 16 | Ht 69.02 in | Wt 120.0 lb

## 2021-10-10 DIAGNOSIS — Z09 Encounter for follow-up examination after completed treatment for conditions other than malignant neoplasm: Secondary | ICD-10-CM

## 2021-10-10 DIAGNOSIS — K279 Peptic ulcer, site unspecified, unspecified as acute or chronic, without hemorrhage or perforation: Secondary | ICD-10-CM

## 2021-10-10 DIAGNOSIS — B370 Candidal stomatitis: Secondary | ICD-10-CM

## 2021-10-10 DIAGNOSIS — E785 Hyperlipidemia, unspecified: Secondary | ICD-10-CM

## 2021-10-10 DIAGNOSIS — R1310 Dysphagia, unspecified: Secondary | ICD-10-CM | POA: Diagnosis not present

## 2021-10-10 DIAGNOSIS — R4701 Aphasia: Secondary | ICD-10-CM

## 2021-10-10 DIAGNOSIS — I10 Essential (primary) hypertension: Secondary | ICD-10-CM | POA: Diagnosis not present

## 2021-10-10 DIAGNOSIS — R7303 Prediabetes: Secondary | ICD-10-CM

## 2021-10-10 DIAGNOSIS — I63512 Cerebral infarction due to unspecified occlusion or stenosis of left middle cerebral artery: Secondary | ICD-10-CM | POA: Diagnosis not present

## 2021-10-10 DIAGNOSIS — K219 Gastro-esophageal reflux disease without esophagitis: Secondary | ICD-10-CM

## 2021-10-10 DIAGNOSIS — F1910 Other psychoactive substance abuse, uncomplicated: Secondary | ICD-10-CM

## 2021-10-10 MED ORDER — OMEPRAZOLE 20 MG PO CPDR: 20.0000 mg | DELAYED_RELEASE_CAPSULE | Freq: Every day | ORAL | 0 refills | Status: DC

## 2021-10-10 MED ORDER — ASPIRIN 325 MG PO TABS: 325.0000 mg | ORAL_TABLET | Freq: Every day | ORAL | 0 refills | Status: AC

## 2021-10-10 NOTE — Patient Instructions (Signed)
Ischemic Stroke  An ischemic stroke (cerebrovascular accident, CVA) occurs when an area of the brain does not get enough blood flow. This leads to the sudden death of brain tissue and can cause brain damage. An ischemic stroke is a medical emergency. It must be treated right away. What are the causes? This condition is caused by a decrease of blood flow to a part of the brain. This may be due to: A small blood clot (embolus). A buildup of plaque in the blood vessels (atherosclerosis). This blocks blood flow in the brain. An abnormal heart rhythm, called atrial fibrillation (AFib). This sends a small blood clot to the brain. A blocked or damaged artery in the head or neck. Certain infections. Inflammation of the arteries in the brain (vasculitis). Sometimes, the cause of ischemic stroke is not known. What increases the risk? The following medical conditions may increase your risk of a stroke: High blood pressure (hypertension). Heart disease. Diabetes. High cholesterol. Obesity. Sleep problems (sleep apnea). Other risk factors that you can change include: Using products that contain nicotine or tobacco. Not being active. Heavy use of alcohol or drugs, especially cocaine and methamphetamine. Taking birth control pills, especially if you also use tobacco. Risk factors that you cannot change include: Being older than age 60. Having a history of blood clots, stroke, or mini-stroke (transient ischemic attack, TIA). Having a family history of stroke. What are the signs or symptoms? Symptoms of this condition usually develop suddenly, or you may notice them after waking from sleep. These may include: Weakness or numbness of your face, arm, or leg, especially on one side of your body. Loss of balance or coordination. Slurred speech, trouble speaking, trouble understanding speech, or a combination of these. Vision changes. You may have double vision, blurred vision, or loss of  vision. Dizziness or confusion. Nausea and vomiting. Severe headache. If possible, write down the exact time your symptoms started. Tell your health care provider. If symptoms come and go, they could be signs of a TIA. Get help right away, even if you feel better. How is this diagnosed? This condition may be diagnosed based on: Your symptoms, your medical history, and a physical exam. CT scan of the brain. MRI. Imaging tests that scan blood flow in the brain (CT angiogram, MRI angiogram, or cerebral angiogram). You may also have other tests, including: Electrocardiogram (ECG). Continuous heart monitoring. Transthoracic echocardiogram (TTE). Transesophageal echocardiogram (TEE). Carotid ultrasound. Blood tests. Sleep study to check for sleep apnea. You may need to see a health care provider who specializes in stroke care. How is this treated? Treatment for this condition depends on the area of the brain affected and the cause of your symptoms. Some treatments work better if they are done within 3-6 hours of the start of symptoms. These may include: Medicine that is injected to dissolve the blood clot. Treatments given directly to the affected artery to remove or dissolve the blood clot. Medicines to control blood pressure. Medicines to thin the blood (anticoagulant or antiplatelet). Other treatments may include: Oxygen. IV fluids. Procedures to increase blood flow. After a stroke, you may work with physical, speech, mental health, or occupational therapists to help you recover. Follow these instructions at home: Medicines Take over-the-counter and prescription medicines only as told by your health care provider. If you were told to take a medicine to thin your blood, take it exactly as told, at the same time every day. Taking too much of a blood thinner can cause bleeding. Taking   too little may not protect you against a stroke and other problems. If you were not prescribed  medicines that contain aspirin or NSAIDs, such as ibuprofen, talk with your health care provider before you take any of these. These medicines increase your risk for dangerous bleeding. When taking a blood thinner, do these things: Hold pressure over any cuts that bleed for longer than usual. Tell your dentist and other health care providers that you are taking anticoagulants before having any procedures that may cause bleeding. Avoid activities that could cause injury or bruising. Wear a medical alert bracelet or carry a card that lists what medicines you take. Eating and drinking Follow instructions from your health care provider about diet. Eat healthy foods. If your stroke affected your ability to swallow, you may need to take steps to avoid choking. These may include: Taking small bites of food. Eating soft or pureed foods. Safety Follow instructions from your health care team about physical activity. Use a walker or cane as told by your health care provider. Take steps to lower your risk of falls at home. These may include: Installing grab bars in the bedroom and bathroom. Using raised toilets and putting a seat in the shower. Removing clutter and tripping hazards, such as cords or area rugs. General instructions Do not use any products that contain nicotine or tobacco. These include cigarettes, chewing tobacco, and vaping devices, such as e-cigarettes. If you need help quitting, ask your health care provider. If you drink alcohol: Limit how much you have to: 0-1 drink a day for women who are not pregnant. 0-2 drinks a day for men. Know how much alcohol is in your drink. In the [place] one drink equals one 12 oz bottle of beer (355 mL), one 5 oz glass of wine (148 mL), or one 1 oz glass of hard liquor (44 mL). Keep all follow-up visits. This is important. How is this prevented? You can lower your risk of another stroke by managing these conditions: High blood pressure. High  cholesterol. Diabetes. Heart disease. Sleep apnea. Obesity. Quitting smoking, limiting alcohol, and staying physically active also will reduce your risk. Your health care provider will continue to help you with ways to prevent short-term and long-term problems caused by stroke. Get help right away if: You have any symptoms of a stroke. "BE FAST" is an easy way to remember the main warning signs of a stroke: B - Balance. Signs are dizziness, sudden trouble walking, or loss of balance. E - Eyes. Signs are trouble seeing or a sudden change in vision. F - Face. Signs are sudden weakness or numbness of the face, or the face or eyelid drooping on one side. A - Arms. Signs are weakness or numbness in an arm. This happens suddenly and usually on one side of the body. S - Speech. Signs are sudden trouble speaking, slurred speech, or trouble understanding what people say. T - Time. Time to call emergency services. Write down what time symptoms started. You have other signs of a stroke, such as: A sudden, severe headache with no known cause. Nausea or vomiting. Seizure. These symptoms may represent a serious problem that is an emergency. Do not wait to see if the symptoms will go away. Get medical help right away. Call your local emergency services (911 in the [place] Do not drive yourself to the hospital. Summary An ischemic stroke (cerebrovascular accident, CVA) occurs when an area of the brain does not get enough blood flow. Symptoms of this   condition usually develop suddenly, or you may notice them after waking from sleep. It is very important to get treatment at the first sign of stroke symptoms. Stroke is a medical emergency that must be treated right away. This information is not intended to replace advice given to you by your health care provider. Make sure you discuss any questions you have with your health care provider. Document Revised: 12/03/2019 Document Reviewed: 12/03/2019 Elsevier  Patient Education  2022 [employer]  

## 2021-10-10 NOTE — Progress Notes (Signed)
..  Pt presents for transition of care  

## 2021-10-11 ENCOUNTER — Ambulatory Visit: Payer: Medicaid Other | Attending: Physician Assistant | Admitting: Occupational Therapy

## 2021-10-11 ENCOUNTER — Encounter: Payer: Self-pay | Admitting: Occupational Therapy

## 2021-10-11 DIAGNOSIS — R278 Other lack of coordination: Secondary | ICD-10-CM | POA: Diagnosis not present

## 2021-10-11 DIAGNOSIS — I69353 Hemiplegia and hemiparesis following cerebral infarction affecting right non-dominant side: Secondary | ICD-10-CM | POA: Diagnosis present

## 2021-10-11 DIAGNOSIS — M6281 Muscle weakness (generalized): Secondary | ICD-10-CM | POA: Diagnosis present

## 2021-10-11 DIAGNOSIS — M25511 Pain in right shoulder: Secondary | ICD-10-CM | POA: Diagnosis present

## 2021-10-11 NOTE — Patient Instructions (Signed)
Flexion (Assistive)    LAYING DOWN: hold Rt wrist with Lt hand (thumb side up) and raise arms SLOWLY above head, keeping elbows as straight as possible.  Repeat __10__ times. Do __2__ sessions per day.   SUPINE: Shoulder Flexion Bilateral (Cane)    Lie on back with knees bent. Hold cane with both hands. Raise both arms overhead, keep elbows straight. _10__ reps per set, _2__ sets per day   AROM: Wrist Extension    With right palm down, SLOWLY bend wrist up. Repeat __10__ times per set.  Do __2__ sessions per day.  4. Grip Strengthening (Resistive Putty)   Squeeze putty or sponge using thumb and all fingers. Repeat _10-15___ times. Do __2__ sessions per day.   SELF ASSISTED: Finger Extension    Use one hand to open fingers on other hand AND hold 10 sec. Repeat _5__ reps per set, _2__ sets per day  USE RT HAND TO:  1) Stack/unstack plastic cups x 5 (can get some plastic cups at [employer])  2) Bring empty plastic cup to mouth and back to table and open hand fully to release, then pull hand away. Repeat 5-10 times 3) Pick up empty water/soda bottle caps between first 3 fingers and place in bowl or plastic cup 4) Flip over large cards or letters/envelopes (can also get flash cards at [employer]) x 20 or more

## 2021-10-11 NOTE — Therapy (Signed)
OUTPATIENT OCCUPATIONAL THERAPY NEURO TREATMENT  Patient Name: Christopher [name] MRN: [id number] DOB:Mar 02, 1966, 56 y.o., male Today's Date: 10/11/2021  PCP: Georganna Skeans, MD REFERRING PROVIDER: Charlton Amor, PA-C    OT End of Session - 10/11/21 1312     Visit Number 2    Number of Visits 17    Date for OT Re-Evaluation 11/26/21    Authorization Type MCD potential - currently self pay    OT Start Time 1310    OT Stop Time 1400    OT Time Calculation (min) 50 min    Activity Tolerance Patient tolerated treatment well    Behavior During Therapy [facility] for tasks assessed/performed             Past Medical History:  Diagnosis Date   PUD (peptic ulcer disease) 2008   treated with meds   History reviewed. No pertinent surgical history. Patient Active Problem List   Diagnosis Date Noted   Acute ischemic left MCA stroke (HCC) 09/14/2021   Pressure injury of skin 09/11/2021   Stroke (HCC) 09/10/2021   Gastroesophageal reflux disease without esophagitis 08/24/2021   History of CVA (cerebrovascular accident) 08/11/2021   Dyslipidemia 01/24/2021   Essential hypertension    Hyponatremia    Polysubstance abuse (HCC)    Cerebral thrombosis with cerebral infarction 01/11/2021   Acute CVA (cerebrovascular accident) (HCC) 01/11/2021   Right sided weakness 01/10/2021   AKI (acute kidney injury) (HCC) 01/10/2021   Tobacco abuse 01/10/2021   Cocaine abuse (HCC) 01/10/2021   Leukocytosis 01/10/2021   Cellulitis and abscess of upper arm and forearm 11/13/2012   Episodic tobacco abuse 11/13/2012    ONSET DATE: 09/10/21  REFERRING DIAG: W09.811 (ICD-10-CM) - Cerebral infarction due to unspecified occlusion or stenosis of left middle cerebral artery   THERAPY DIAG:  Other lack of coordination  Acute pain of right shoulder  Hemiplegia 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 male presents to [facility] on 09/10/2021 with acute R weakenss and slurred speech. CT head negative. Pt received TNK. MRI demonstrates posterior L MCA infarct. PMH includes HTN, HLD, CVA, PUD, hx of cocaine abuse.  PRECAUTIONS: Other: no driving  WEIGHT BEARING RESTRICTIONS No  PAIN:  Are you having pain? Yes: NPRS scale: 5-6/10 Pain location: Rt shoulder/arm Pain description: achy Aggravating factors: movement, malpositioning Relieving factors: rest       OBJECTIVE:   HAND DOMINANCE: Left   TODAY'S TREATMENT:  See pt instructions for details on below HEP for Neuro re-educ RUE and functional use. (Pt already has beige putty at home and sponge to squeeze for Rt hand ROM/strength)  UBE x 8 min, level 1 for normal reciprocal movement pattern and ROM/endurance RUE. Pt was able to hold handle Rt hand w/o wrapping, however had to readjust hand placement x 4 d/t slipping  PATIENT EDUCATION: Education details: initial HEP for RUE shoulder and hand Person educated: Patient Education method: Programmer, multimedia, Demonstration, Verbal cues, and Handouts Education comprehension: verbalized understanding, returned demonstration, and verbal cues required    HOME EXERCISE PROGRAM: 10/11/21: initial HEP for RUE shoulder, hand, and simple coordination tasks    GOALS: Goals reviewed with patient? Yes  SHORT TERM GOALS: Target date: 10/28/21   Pt will be independent with  HEP targeting RUE shoulder and Rt hand coordination  Baseline: not yet addressed Goal status: IN PROGRESS  2.  Pt will improve RUE function as evidenced by performing Box & Blocks to 15 blocks Baseline: 5 blocks Goal status: IN PROGRESS  3.  Pt will report pain Rt shoulder less than or equal to 3/10 with high level reaching Baseline:  5-6/10 Goal status: INITIAL  4.  Pt will improve Rt hand grip strength to 24 lbs or greater Baseline: 14.3 lbs Goal status: IN PROGRESS  5.  Pt to verbalize understanding with safety and compensatory strategies for lack of sensation Rt hand Baseline: not yet addressed Goal status: INITIAL  6.  Pt to verbalize understanding for task modifications and A/E needs to increase independence with ADLS (rocker knife, shoe buttons, etc)  Baseline: not yet addressed Goal status: INITIAL  LONG TERM GOALS: Target date: 11/26/21  Independent with updated HEP PRN Baseline: Not yet addressed Goal status: INITIAL  2.  Pt to improve Rt hand function as evidenced by performing 9 hole peg test in under 2 minutes Baseline: unable at evaluation Goal status: INITIAL  3.  Pt to return to performing snack/sandwich/microwaveable items at mod I level Baseline: dependent Goal status: INITIAL  4.  Pt to return to light IADLS (making bed, folding laundry, etc) safely Baseline: dependent Goal status: INITIAL  5.  Pt to improve RUE function as evidenced by performing 25 blocks or Box & Blocks test Baseline: 5 blocks Goal status: INITIAL  6.  Pt to use Lt hand as assist for bilateral tasks 50% of time Baseline: unable Goal status: INITIAL  ASSESSMENT:  CLINICAL IMPRESSION: Pt progressing with some STG's. Pt w/ increased understanding of ways to use Rt hand/UE in safe functional tasks  PERFORMANCE DEFICITS in functional skills including ADLs, IADLs, coordination, dexterity, proprioception, sensation, edema, tone, ROM, strength, pain, FMC, body mechanics, decreased knowledge of use of DME, and UE functional use,   IMPAIRMENTS are limiting patient from ADLs and IADLs.   COMORBIDITIES may have co-morbidities  that affects occupational performance. Patient will benefit from skilled OT to address above impairments and improve overall function.  MODIFICATION OR ASSISTANCE TO COMPLETE EVALUATION: No  modification of tasks or assist necessary to complete an evaluation.  OT OCCUPATIONAL PROFILE AND HISTORY: Problem focused assessment: Including review of records relating to presenting problem.  CLINICAL DECISION MAKING: Moderate - several treatment options, min-mod task modification necessary  REHAB POTENTIAL: Good  EVALUATION COMPLEXITY: Low    PLAN: OT FREQUENCY: 2x/week  OT DURATION: 8 weeks  PLANNED INTERVENTIONS: self care/ADL training, therapeutic exercise, therapeutic activity, neuromuscular re-education, manual therapy, passive range of motion, functional mobility training, splinting, electrical stimulation, moist heat, cryotherapy, patient/family education, energy conservation, coping strategies training, and DME and/or AE instructions  RECOMMENDED OTHER SERVICES: none  CONSULTED AND AGREED WITH PLAN OF CARE: Patient  PLAN FOR NEXT SESSION: review HEP, discuss safety considerations for lack of sensation Rt hand and A/E needs (shoe buttons, rocker knife)   Sheran Lawless, OT 10/11/2021, 1:12 PM

## 2021-10-12 ENCOUNTER — Telehealth: Payer: Self-pay | Admitting: Family Medicine

## 2021-10-12 NOTE — Telephone Encounter (Signed)
Pt was to call back today and let Dr Redmond Pulling know if he has a neurologist. Pt does have a neurologist. His name  Dr Josie Dixon  [facility] Appt: July 26 9:30  His stroke dr is Dr Alysia Penna , appt on June 29.

## 2021-10-13 ENCOUNTER — Encounter: Payer: Self-pay | Admitting: Occupational Therapy

## 2021-10-13 ENCOUNTER — Ambulatory Visit: Payer: Medicaid Other | Admitting: Occupational Therapy

## 2021-10-13 DIAGNOSIS — I69353 Hemiplegia and hemiparesis following cerebral infarction affecting right non-dominant side: Secondary | ICD-10-CM

## 2021-10-13 DIAGNOSIS — M25511 Pain in right shoulder: Secondary | ICD-10-CM

## 2021-10-13 DIAGNOSIS — R278 Other lack of coordination: Secondary | ICD-10-CM

## 2021-10-13 NOTE — Patient Instructions (Signed)
  Recommendations:   - Always look at Rt hand when using it  - Do NOT use Rt hand for anything: heavy, breakable, sharp, or hot  - Test temperature of water for showering, washing hands, dishes, etc with Lt hand  - Do not chop fruits/veggies. Can use hand chopper, buy things already cut, or use one handed cutting board  Recommend using pizza roller or rocker knife (refer to picture in handout) to cut already cooked food/meat  Recommend elastic shoe laces, spyrolaces, shoe buttons, or supportive slip on shoes (or can continue to do what you are doing)

## 2021-10-13 NOTE — Therapy (Signed)
OUTPATIENT OCCUPATIONAL THERAPY NEURO TREATMENT  Patient Name: Christopher [name] MRN: [id number] DOB:[birth year], 56 y.o., male Today's Date: 10/13/2021  PCP: Dorna Mai, MD REFERRING PROVIDER: Cathlyn Parsons, PA-C    OT End of Session - 10/13/21 1022     Visit Number 3    Number of Visits 17    Date for OT Re-Evaluation 11/26/21    Authorization Type MCD potential - currently self pay    OT Start Time 1018    OT Stop Time 1100    OT Time Calculation (min) 42 min    Activity Tolerance Patient tolerated treatment well    Behavior During Therapy [facility] for tasks assessed/performed             Past Medical History:  Diagnosis Date   PUD (peptic ulcer disease) 2008   treated with meds   History reviewed. No pertinent surgical history. Patient Active Problem List   Diagnosis Date Noted   Acute ischemic left MCA stroke ([place]) 09/14/2021   Pressure injury of skin 09/11/2021   Stroke ([place]) 09/10/2021   Gastroesophageal reflux disease without esophagitis 08/24/2021   History of CVA (cerebrovascular accident) 08/11/2021   Dyslipidemia 01/24/2021   Essential hypertension    Hyponatremia    Polysubstance abuse ([place])    Cerebral thrombosis with cerebral infarction 01/11/2021   Acute CVA (cerebrovascular accident) ([place]) 01/11/2021   Right sided weakness 01/10/2021   AKI (acute kidney injury) ([place]) 01/10/2021   Tobacco abuse 01/10/2021   Cocaine abuse ([place]) 01/10/2021   Leukocytosis 01/10/2021   Cellulitis and abscess of upper arm and forearm 11/13/2012   Episodic tobacco abuse 11/13/2012    ONSET DATE: 09/10/21  REFERRING DIAG: M54.650 (ICD-10-CM) - Cerebral infarction due to unspecified occlusion or stenosis of left middle cerebral artery   THERAPY DIAG:  Hemiplegia 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 Exercises are helping  Pt accompanied by: self  PERTINENT HISTORY: 56 y.o. male presents to [facility] hospital on 09/10/2021 with acute R weakenss and slurred speech. CT head negative. Pt received TNK. MRI demonstrates posterior L MCA infarct. PMH includes HTN, HLD, CVA, PUD, hx of cocaine abuse.  PRECAUTIONS: Other: no driving  WEIGHT BEARING RESTRICTIONS No  PAIN:  Are you having pain? Yes: NPRS scale: 3/10 Pain location: Rt shoulder/arm Pain description: achy Aggravating factors: movement, malpositioning Relieving factors: rest       OBJECTIVE:   HAND DOMINANCE: Left   TODAY'S TREATMENT:  Reviewed previously issued HEP for RUE  See pt instructions and below for details on recommendations and A/E - extensively reviewed w/ patient.   Digiflex for Rt grip strength: yellow resistance x 10 reps, then red resistance x 15 reps UBE x 8 min, level 1 for normal reciprocal movement pattern and UB conditioning - pt was was able to hold onto handle better Rt hand w/ less slips than last session  PATIENT EDUCATION: Education details: Recommendations on safety d/t lack of sensation Rt hand, and A/E recommendations Person educated: Patient Education method: Explanation and Handouts Education comprehension: verbalized understanding    HOME EXERCISE PROGRAM: 10/11/21: initial HEP for RUE shoulder, hand, and simple coordination tasks 10/13/21: Recommendations on safety d/t lack of sensation Rt hand, and A/E recommendations   GOALS: Goals reviewed with patient? Yes  SHORT TERM GOALS: Target  date: 10/28/21   Pt will be independent with HEP targeting RUE shoulder and Rt hand coordination  Baseline: not yet addressed Goal status: IN PROGRESS  2.  Pt will improve RUE function as evidenced by performing Box & Blocks to 15 blocks Baseline: 5 blocks Goal status: IN PROGRESS  3.  Pt will report pain  Rt shoulder less than or equal to 3/10 with high level reaching Baseline: 5-6/10 Goal status: IN PROGRESS  4.  Pt will improve Rt hand grip strength to 24 lbs or greater Baseline: 14.3 lbs Goal status: IN PROGRESS  5.  Pt to verbalize understanding with safety and compensatory strategies for lack of sensation Rt hand Baseline: not yet addressed Goal status: MET  6.  Pt to verbalize understanding for task modifications and A/E needs to increase independence with ADLS (rocker knife, shoe buttons, etc)  Baseline: not yet addressed Goal status: IN PROGRESS  LONG TERM GOALS: Target date: 11/26/21  Independent with updated HEP PRN Baseline: Not yet addressed Goal status: INITIAL  2.  Pt to improve Rt hand function as evidenced by performing 9 hole peg test in under 2 minutes Baseline: unable at evaluation Goal status: INITIAL  3.  Pt to return to performing snack/sandwich/microwaveable items at mod I level Baseline: dependent Goal status: INITIAL  4.  Pt to return to light IADLS (making bed, folding laundry, etc) safely Baseline: dependent Goal status: INITIAL  5.  Pt to improve RUE function as evidenced by performing 25 blocks or Box & Blocks test Baseline: 5 blocks Goal status: INITIAL  6.  Pt to use Lt hand as assist for bilateral tasks 50% of time Baseline: unable Goal status: INITIAL  ASSESSMENT:  CLINICAL IMPRESSION: Pt progressing with all STG's. Pt met STG #5. Pt w/ increased understanding of ways to use Rt hand/UE in safe functional tasks and what to avoid d/t lack of sensation Rt hand  PERFORMANCE DEFICITS in functional skills including ADLs, IADLs, coordination, dexterity, proprioception, sensation, edema, tone, ROM, strength, pain, FMC, body mechanics, decreased knowledge of use of DME, and UE functional use,   IMPAIRMENTS are limiting patient from ADLs and IADLs.   COMORBIDITIES may have co-morbidities  that affects occupational performance. Patient will  benefit from skilled OT to address above impairments and improve overall function.  MODIFICATION OR ASSISTANCE TO COMPLETE EVALUATION: No modification of tasks or assist necessary to complete an evaluation.  OT OCCUPATIONAL PROFILE AND HISTORY: Problem focused assessment: Including review of records relating to presenting problem.  CLINICAL DECISION MAKING: Moderate - several treatment options, min-mod task modification necessary  REHAB POTENTIAL: Good  EVALUATION COMPLEXITY: Low    PLAN: OT FREQUENCY: 2x/week  OT DURATION: 8 weeks  PLANNED INTERVENTIONS: self care/ADL training, therapeutic exercise, therapeutic activity, neuromuscular re-education, manual therapy, passive range of motion, functional mobility training, splinting, electrical stimulation, moist heat, cryotherapy, patient/family education, energy conservation, coping strategies training, and DME and/or AE instructions  RECOMMENDED OTHER SERVICES: none  CONSULTED AND AGREED WITH PLAN OF CARE: Patient  PLAN FOR NEXT SESSION: continue simple coordination and functional reaching RUE, UBE, grip strength  Hans Eden, OT 10/13/2021, 10:22 AM

## 2021-10-17 ENCOUNTER — Ambulatory Visit: Payer: Medicaid Other | Admitting: Occupational Therapy

## 2021-10-17 ENCOUNTER — Encounter: Payer: Self-pay | Admitting: Occupational Therapy

## 2021-10-17 DIAGNOSIS — R278 Other lack of coordination: Secondary | ICD-10-CM

## 2021-10-17 DIAGNOSIS — I69353 Hemiplegia and hemiparesis following cerebral infarction affecting right non-dominant side: Secondary | ICD-10-CM

## 2021-10-17 DIAGNOSIS — M25511 Pain in right shoulder: Secondary | ICD-10-CM

## 2021-10-17 NOTE — Therapy (Signed)
OUTPATIENT OCCUPATIONAL THERAPY NEURO TREATMENT  Patient Name: Christopher [name] MRN: [id number] DOB:[birth year], 56 y.o., male Today's Date: 10/17/2021  PCP: Dorna Mai, MD REFERRING PROVIDER: Cathlyn Parsons, PA-C    OT End of Session - 10/17/21 1017     Visit Number 4    Number of Visits 17    Date for OT Re-Evaluation 11/26/21    Authorization Type MCD potential - currently self pay    OT Start Time 1015    OT Stop Time 1105    OT Time Calculation (min) 50 min    Activity Tolerance Patient tolerated treatment well    Behavior During Therapy [facility] for tasks assessed/performed             Past Medical History:  Diagnosis Date   PUD (peptic ulcer disease) 2008   treated with meds   History reviewed. No pertinent surgical history. Patient Active Problem List   Diagnosis Date Noted   Acute ischemic left MCA stroke ([place]) 09/14/2021   Pressure injury of skin 09/11/2021   Stroke ([place]) 09/10/2021   Gastroesophageal reflux disease without esophagitis 08/24/2021   History of CVA (cerebrovascular accident) 08/11/2021   Dyslipidemia 01/24/2021   Essential hypertension    Hyponatremia    Polysubstance abuse ([place])    Cerebral thrombosis with cerebral infarction 01/11/2021   Acute CVA (cerebrovascular accident) ([place]) 01/11/2021   Right sided weakness 01/10/2021   AKI (acute kidney injury) ([place]) 01/10/2021   Tobacco abuse 01/10/2021   Cocaine abuse ([place]) 01/10/2021   Leukocytosis 01/10/2021   Cellulitis and abscess of upper arm and forearm 11/13/2012   Episodic tobacco abuse 11/13/2012    ONSET DATE: 09/10/21  REFERRING DIAG: A44.975 (ICD-10-CM) - Cerebral infarction due to unspecified occlusion or stenosis of left middle cerebral artery   THERAPY DIAG:  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 male presents to [facility] hospital on 09/10/2021 with acute R weakenss and slurred speech. CT head negative. Pt received TNK. MRI demonstrates posterior L MCA infarct. PMH includes HTN, HLD, CVA, PUD, hx of cocaine abuse.  PRECAUTIONS: Other: no driving  WEIGHT BEARING RESTRICTIONS No  PAIN:  Are you having pain? Yes: NPRS scale: 3-4/10 Pain location: Rt shoulder/arm Pain description: achy Aggravating factors: movement, malpositioning Relieving factors: rest       OBJECTIVE:   HAND DOMINANCE: Left   TODAY'S TREATMENT:  Pt unable to place large white pegs in semi-circular pegboard, however when placed by therapist, pt could remove them w/ Rt hand w/ min drops. Progressed to also removing slightly smaller red pegs w/ Rt hand w/ mod drops.   Gripper set at level 1 resistance to pick up blocks but pt unable to d/t lack of control RUE, decreased grip strength and combined control/coordination. Pt then practiced picking up blocks w/ Rt hand and placing in bowl w/ min to mod difficulty and min drops, extra time required.  Prone: scapula retraction (BUE) w/ max tactile cues required d/t apraxia and decreased sensation.  Prone on elbows: chest lift for scapula depression and sh girdle strengthening/stability.  Prone w/ RUE over EOB: sh extension x 10 w/ cues to go slower  Digiflex (yellow resistance) x 20 for grip strength Rt hand.  UBE x 8 min, level 1 for normal reciprocal movement pattern and UB conditioning    PATIENT EDUCATION: Education details: Recommendations on safety d/t lack of sensation Rt hand, and A/E recommendations Person educated: Patient Education method: Explanation and Handouts Education comprehension: verbalized understanding    HOME EXERCISE PROGRAM: 10/11/21: initial HEP for RUE shoulder, hand, and simple coordination  tasks 10/13/21: Recommendations on safety d/t lack of sensation Rt hand, and A/E recommendations   GOALS: Goals reviewed with patient? Yes  SHORT TERM GOALS: Target date: 10/28/21   Pt will be independent with HEP targeting RUE shoulder and Rt hand coordination  Baseline: not yet addressed Goal status: IN PROGRESS  2.  Pt will improve RUE function as evidenced by performing Box & Blocks to 15 blocks Baseline: 5 blocks Goal status: IN PROGRESS  3.  Pt will report pain Rt shoulder less than or equal to 3/10 with high level reaching Baseline: 5-6/10 Goal status: IN PROGRESS  4.  Pt will improve Rt hand grip strength to 24 lbs or greater Baseline: 14.3 lbs Goal status: IN PROGRESS  5.  Pt to verbalize understanding with safety and compensatory strategies for lack of sensation Rt hand Baseline: not yet addressed Goal status: MET  6.  Pt to verbalize understanding for task modifications and A/E needs to increase independence with ADLS (rocker knife, shoe buttons, etc)  Baseline: not yet addressed Goal status: IN PROGRESS  LONG TERM GOALS: Target date: 11/26/21  Independent with updated HEP PRN Baseline: Not yet addressed Goal status: INITIAL  2.  Pt to improve Rt hand function as evidenced by performing 9 hole peg test in under 2 minutes Baseline: unable at evaluation Goal status: INITIAL  3.  Pt to return to performing snack/sandwich/microwaveable items at mod I level Baseline: dependent Goal status: INITIAL  4.  Pt to return to light IADLS (making bed, folding laundry, etc) safely Baseline: dependent Goal status: INITIAL  5.  Pt to improve RUE function as evidenced by performing 25 blocks or Box & Blocks test Baseline: 5 blocks Goal status: INITIAL  6.  Pt to use Lt hand as assist for bilateral tasks 50% of time Baseline: unable Goal status: INITIAL  ASSESSMENT:  CLINICAL IMPRESSION: Pt progressing with all STG's. Pt appears to have some apraxia and decreased  sensation for  scapula retraction today  PERFORMANCE DEFICITS in functional skills including ADLs, IADLs, coordination, dexterity, proprioception, sensation, edema, tone, ROM, strength, pain, FMC, body mechanics, decreased knowledge of use of DME, and UE functional use,   IMPAIRMENTS are limiting patient from ADLs and IADLs.   COMORBIDITIES may have co-morbidities  that affects occupational performance. Patient will benefit from skilled OT to address above impairments and improve overall function.  MODIFICATION OR ASSISTANCE TO COMPLETE EVALUATION: No modification of tasks or assist necessary to complete an evaluation.  OT OCCUPATIONAL PROFILE AND HISTORY: Problem focused assessment: Including review of records relating to presenting problem.  CLINICAL DECISION MAKING: Moderate - several treatment options, min-mod task modification necessary  REHAB POTENTIAL: Good  EVALUATION COMPLEXITY: Low    PLAN: OT FREQUENCY: 2x/week  OT DURATION: 8 weeks  PLANNED INTERVENTIONS: self care/ADL training, therapeutic exercise, therapeutic activity, neuromuscular re-education, manual therapy, passive range of motion, functional mobility training, splinting, electrical stimulation, moist heat, cryotherapy, patient/family education, energy conservation, coping strategies training, and DME and/or AE instructions  RECOMMENDED OTHER SERVICES: none  CONSULTED AND AGREED WITH PLAN OF CARE: Patient  PLAN FOR NEXT SESSION: continue simple coordination and functional reaching  RUE, UBE, grip strength  Hans Eden, OT 10/17/2021, 3:16 PM

## 2021-10-27 ENCOUNTER — Ambulatory Visit: Payer: Medicaid Other | Admitting: Occupational Therapy

## 2021-10-27 ENCOUNTER — Encounter: Payer: Self-pay | Admitting: Occupational Therapy

## 2021-10-27 DIAGNOSIS — I69353 Hemiplegia and hemiparesis following cerebral infarction affecting right non-dominant side: Secondary | ICD-10-CM

## 2021-10-27 DIAGNOSIS — M6281 Muscle weakness (generalized): Secondary | ICD-10-CM

## 2021-10-27 DIAGNOSIS — R278 Other lack of coordination: Secondary | ICD-10-CM

## 2021-10-27 DIAGNOSIS — M25511 Pain in right shoulder: Secondary | ICD-10-CM

## 2021-10-27 NOTE — Therapy (Signed)
OUTPATIENT OCCUPATIONAL THERAPY NEURO TREATMENT  Patient Name: Christopher [name] MRN: [id number] DOB:1965/06/04, 56 y.o., male Today's Date: 10/27/2021  PCP: Dorna Mai, MD REFERRING PROVIDER: Cathlyn Parsons, PA-C    OT End of Session - 10/27/21 1120     Visit Number 5    Number of Visits 17    Date for OT Re-Evaluation 11/26/21    Authorization Type MCD potential - currently self pay    OT Start Time 1100    OT Stop Time 1145    OT Time Calculation (min) 45 min    Activity Tolerance Patient tolerated treatment well    Behavior During Therapy [facility] for tasks assessed/performed              Past Medical History:  Diagnosis Date   PUD (peptic ulcer disease) 2008   treated with meds   History reviewed. No pertinent surgical history. Patient Active Problem List   Diagnosis Date Noted   Acute ischemic left MCA stroke ([place]) 09/14/2021   Pressure injury of skin 09/11/2021   Stroke ([place]) 09/10/2021   Gastroesophageal reflux disease without esophagitis 08/24/2021   History of CVA (cerebrovascular accident) 08/11/2021   Dyslipidemia 01/24/2021   Essential hypertension    Hyponatremia    Polysubstance abuse ([place])    Cerebral thrombosis with cerebral infarction 01/11/2021   Acute CVA (cerebrovascular accident) ([place]) 01/11/2021   Right sided weakness 01/10/2021   AKI (acute kidney injury) ([place]) 01/10/2021   Tobacco abuse 01/10/2021   Cocaine abuse ([place]) 01/10/2021   Leukocytosis 01/10/2021   Cellulitis and abscess of upper arm and forearm 11/13/2012   Episodic tobacco abuse 11/13/2012    ONSET DATE: 09/10/21  REFERRING DIAG: Y24.825 (ICD-10-CM) - Cerebral infarction due to unspecified occlusion or stenosis of left middle cerebral artery   THERAPY DIAG:  Hemiplegia and hemiparesis following cerebral infarction affecting right non-dominant side (HCC)  Other lack of coordination  Acute pain of right shoulder  Muscle weakness (generalized)  Rationale for  Evaluation and Treatment Rehabilitation  SUBJECTIVE:   SUBJECTIVE STATEMENT: Pt reports things are "about the same"  Pt accompanied by: self  PERTINENT HISTORY: 56 y.o. male presents to [facility] hospital on 09/10/2021 with acute R weakenss and slurred speech. CT head negative. Pt received TNK. MRI demonstrates posterior L MCA infarct. PMH includes HTN, HLD, CVA, PUD, hx of cocaine abuse.  PRECAUTIONS: Other: no driving  WEIGHT BEARING RESTRICTIONS No  PAIN:  Are you having pain? No      OBJECTIVE:   HAND DOMINANCE: Left   TODAY'S TREATMENT:   Resistance Clothespins pt able to place 4 yellow clothespins with max difficulty however activity discontinued d/t difficulty  Connect Four pieces with picking up and placing into bowl while using vision to compensate for sensation deficits. RUE. Then worked on picking up off of grip/shelf liner on table. Pt encouraged to engage wrist extension and get thumb under for picking up which increased accuracy and ease with task.   Color Dowel Pegs removed with RUE with increased wrist extension for increasing grasp ability.   Arm Bike: for conditioning and reciprocal movements on Level 1 for 8 minutes (forward and backward)    HOME EXERCISE PROGRAM: 10/11/21: initial HEP for RUE shoulder, hand, and simple coordination tasks 10/13/21: Recommendations on safety d/t lack of sensation Rt hand, and A/E recommendations   GOALS: Goals reviewed with patient? Yes  SHORT TERM GOALS: Target date: 10/28/21   Pt will be independent with HEP targeting RUE shoulder and Rt  hand coordination  Baseline: not yet addressed Goal status: IN PROGRESS  2.  Pt will improve RUE function as evidenced by performing Box & Blocks to 15 blocks Baseline: 5 blocks Goal status: ACHIEVED 16 blocks 10/27/21  3.  Pt will report pain Rt shoulder less than or equal to 3/10 with high level reaching Baseline: 5-6/10 Goal status: ACHIEVED reports 1-2/10 pain with flexion  4.  Pt  will improve Rt hand grip strength to 24 lbs or greater Baseline: 14.3 lbs Goal status: IN PROGRESS 18.7 lbs 10/27/21  5.  Pt to verbalize understanding with safety and compensatory strategies for lack of sensation Rt hand Baseline: not yet addressed Goal status: MET  6.  Pt to verbalize understanding for task modifications and A/E needs to increase independence with ADLS (rocker knife, shoe buttons, etc)  Baseline: not yet addressed Goal status: IN PROGRESS  LONG TERM GOALS: Target date: 11/26/21  Independent with updated HEP PRN Baseline: Not yet addressed Goal status: INITIAL  2.  Pt to improve Rt hand function as evidenced by performing 9 hole peg test in under 2 minutes Baseline: unable at evaluation Goal status: INITIAL  3.  Pt to return to performing snack/sandwich/microwaveable items at mod I level Baseline: dependent Goal status: INITIAL  4.  Pt to return to light IADLS (making bed, folding laundry, etc) safely Baseline: dependent Goal status: INITIAL  5.  Pt to improve RUE function as evidenced by performing 25 blocks or Box & Blocks test Baseline: 5 blocks Goal status: INITIAL  6.  Pt to use Lt hand as assist for bilateral tasks 50% of time Baseline: unable Goal status: INITIAL  ASSESSMENT:  CLINICAL IMPRESSION: Pt has met 3/5 STGs and is progressing well towards remaining goals.   PERFORMANCE DEFICITS in functional skills including ADLs, IADLs, coordination, dexterity, proprioception, sensation, edema, tone, ROM, strength, pain, FMC, body mechanics, decreased knowledge of use of DME, and UE functional use,   IMPAIRMENTS are limiting patient from ADLs and IADLs.   COMORBIDITIES may have co-morbidities  that affects occupational performance. Patient will benefit from skilled OT to address above impairments and improve overall function.  MODIFICATION OR ASSISTANCE TO COMPLETE EVALUATION: No modification of tasks or assist necessary to complete an  evaluation.  OT OCCUPATIONAL PROFILE AND HISTORY: Problem focused assessment: Including review of records relating to presenting problem.  CLINICAL DECISION MAKING: Moderate - several treatment options, min-mod task modification necessary  REHAB POTENTIAL: Good  EVALUATION COMPLEXITY: Low    PLAN: OT FREQUENCY: 2x/week  OT DURATION: 8 weeks  PLANNED INTERVENTIONS: self care/ADL training, therapeutic exercise, therapeutic activity, neuromuscular re-education, manual therapy, passive range of motion, functional mobility training, splinting, electrical stimulation, moist heat, cryotherapy, patient/family education, energy conservation, coping strategies training, and DME and/or AE instructions  RECOMMENDED OTHER SERVICES: none  CONSULTED AND AGREED WITH PLAN OF CARE: Patient  PLAN FOR NEXT SESSION: continue simple coordination and functional reaching RUE, UBE, grip strength  Zachery Conch, OT 10/27/2021, 11:21 AM

## 2021-10-31 ENCOUNTER — Ambulatory Visit: Payer: Medicaid Other | Admitting: Occupational Therapy

## 2021-10-31 ENCOUNTER — Encounter: Payer: Self-pay | Admitting: Occupational Therapy

## 2021-10-31 DIAGNOSIS — M25511 Pain in right shoulder: Secondary | ICD-10-CM

## 2021-10-31 DIAGNOSIS — I69353 Hemiplegia and hemiparesis following cerebral infarction affecting right non-dominant side: Secondary | ICD-10-CM

## 2021-10-31 DIAGNOSIS — R278 Other lack of coordination: Secondary | ICD-10-CM | POA: Diagnosis not present

## 2021-10-31 DIAGNOSIS — M6281 Muscle weakness (generalized): Secondary | ICD-10-CM

## 2021-11-03 ENCOUNTER — Encounter: Payer: Self-pay | Admitting: Physical Medicine & Rehabilitation

## 2021-11-03 ENCOUNTER — Encounter: Payer: Medicaid Other | Attending: Physical Medicine & Rehabilitation | Admitting: Physical Medicine & Rehabilitation

## 2021-11-03 VITALS — BP 177/90 | HR 80 | Ht 69.02 in | Wt 141.0 lb

## 2021-11-03 DIAGNOSIS — I69351 Hemiplegia and hemiparesis following cerebral infarction affecting right dominant side: Secondary | ICD-10-CM | POA: Diagnosis present

## 2021-11-03 NOTE — Progress Notes (Signed)
Subjective:    Patient ID: Christopher [name], male    DOB: 12/22/1965, 56 y.o.   MRN: [id number] 56 y.o. male presented to ED with right arm weakness and garbled speech. Received tenecteplase. MRI consistent with left MCA and perirolandic infarct likely secondary to arthrosclerosis and cocaine use. RUE weakness and aphasia persists.   Admit date: 09/14/2021 Discharge date: 09/21/2021  HPI 56 year old male with left MCA distribution stroke causing primarily right upper extremity weakness.  He is now living at home with family and does not require any assistance from them other than transportation. Taking walks early in am   Smokes ~1/2 ppd ( cut down) , drinks ~1 beer a week, denies cocaine use   Going to OP OT, working on right upper extremity fine motor and gross motor skills.  Mod I ADLs and mobility , brother in law drives him to therapy appts   The patient does have some right shoulder pain.  He does have a history of left shoulder pain and has benefited from injections.  We discussed that shoulder pain post stroke is common and sometimes cortisone injections can be helpful. Pain Inventory Average Pain 5 Pain Right Now 0 My pain is aching  LOCATION OF PAIN  wrist, hand, fingers  BOWEL Number of stools per week: 5   BLADDER Normal    Mobility walk without assistance how many minutes can you walk? 30 ability to climb steps?  yes do you drive?  no  Function disabled: date disabled 09/2021 I need assistance with the following:  meal prep  Neuro/Psych numbness  Prior Studies Any changes since last visit?  no  Physicians involved in your care Any changes since last visit?  no   Family History  Problem Relation Age of Onset   Cancer Mother    Stroke Father    Social History   Socioeconomic History   Marital status: Divorced    Spouse name: Not on file   Number of children: Not on file   Years of education: Not on file   Highest education level: Not on  file  Occupational History   Not on file  Tobacco Use   Smoking status: Every Day    Packs/day: 0.50    Types: Cigarettes   Smokeless tobacco: Former  Building services engineer Use: Never used  Substance and Sexual Activity   Alcohol use: Yes    Comment: 2-3 "sparks" a day   Drug use: Yes    Types: "Crack" cocaine    Comment: "only every once in awhile"   Sexual activity: Not Currently  Other Topics Concern   Not on file  Social History Narrative   Not on file   Social Determinants of Health   Financial Resource Strain: Not on file  Food Insecurity: Not on file  Transportation Needs: Not on file  Physical Activity: Not on file  Stress: Not on file  Social Connections: Not on file   History reviewed. No pertinent surgical history. Past Medical History:  Diagnosis Date   PUD (peptic ulcer disease) 2008   treated with meds   BP (!) 177/90   Pulse 80   Ht 5' 9.02" (1.753 m)   Wt 141 lb (64 kg)   SpO2 94%   BMI 20.81 kg/m   Opioid Risk Score:   Fall Risk Score:  `1  Depression screen [facility] 2/9     11/03/2021   10:18 AM 08/10/2021    3:21 PM 06/09/2021  1:33 PM 04/08/2021   11:58 AM 03/10/2021    3:17 PM 02/24/2021    1:46 PM 02/09/2021   12:05 PM  Depression screen PHQ 2/9  Decreased Interest 3 0 0 0 0 0 0  Down, Depressed, Hopeless 0 0 0 1 0 0 0  PHQ - 2 Score 3 0 0 1 0 0 0  Altered sleeping 0  0 0 0 0 0  Tired, decreased energy 0  0 1 0 0 0  Change in appetite 0  0 3 0 0 0  Feeling bad or failure about yourself  0  0 0 0 0 0  Trouble concentrating 0  0 0 0 0 0  Moving slowly or fidgety/restless 0  0 0 0 0 0  Suicidal thoughts 0  0 0 0 0 0  PHQ-9 Score 3  0 5 0 0 0  Difficult doing work/chores Not difficult at all   Not difficult at all        Review of Systems  Constitutional:  Positive for unexpected weight change.  HENT: Negative.    Eyes: Negative.   Respiratory: Negative.    Cardiovascular: Negative.   Gastrointestinal: Negative.   Endocrine:  Negative.   Genitourinary: Negative.   Musculoskeletal: Negative.   Skin: Negative.   Allergic/Immunologic: Negative.   Neurological:  Positive for numbness.  Hematological: Negative.   Psychiatric/Behavioral: Negative.        Objective:   Physical Exam Vitals and nursing note reviewed.  Constitutional:      Appearance: He is normal weight.  HENT:     Head: Normocephalic and atraumatic.  Eyes:     Extraocular Movements: Extraocular movements intact.     Conjunctiva/sclera: Conjunctivae normal.     Pupils: Pupils are equal, round, and reactive to light.  Musculoskeletal:     Right lower leg: No edema.     Left lower leg: No edema.     Comments: Right shoulder pain with abduction at around 90 degrees. Does have full range of motion actively.  Skin:    General: Skin is warm and dry.     Comments: Right hand has calluses, dry skin, no vasomotor changes  Neurological:     General: No focal deficit present.     Mental Status: He is alert and oriented to person, place, and time.     Comments: Right upper extremity strength 4 - at the deltoid bicep tricep finger flexors and extensors Sensation intact to light touch in the right upper extremity Speech with dysarthria some word finding deficit as well Left upper extremity and bilateral lower extremities have normal strength  Psychiatric:        Mood and Affect: Mood normal.        Behavior: Behavior normal.           Assessment & Plan:  #1.  Left MCA distribution infarct has residual aphasia dysarthria and right upper extremity weakness overall making good progress has had a good functional recovery.  We discussed further reducing his tobacco use and continuing abstinence from cocaine as a means to reduce his risk of a recurrent stroke. 2.  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

## 2021-11-07 ENCOUNTER — Ambulatory Visit: Payer: Medicaid Other | Attending: Physician Assistant | Admitting: Occupational Therapy

## 2021-11-07 ENCOUNTER — Encounter: Payer: Self-pay | Admitting: Occupational Therapy

## 2021-11-07 DIAGNOSIS — R278 Other lack of coordination: Secondary | ICD-10-CM | POA: Insufficient documentation

## 2021-11-07 DIAGNOSIS — M25511 Pain in right shoulder: Secondary | ICD-10-CM | POA: Insufficient documentation

## 2021-11-07 DIAGNOSIS — M6281 Muscle weakness (generalized): Secondary | ICD-10-CM | POA: Insufficient documentation

## 2021-11-07 DIAGNOSIS — I69353 Hemiplegia and hemiparesis following cerebral infarction affecting right non-dominant side: Secondary | ICD-10-CM | POA: Diagnosis present

## 2021-11-07 NOTE — Therapy (Signed)
OUTPATIENT OCCUPATIONAL THERAPY NEURO TREATMENT  Patient Name: Christopher [name] MRN: [id number] DOB:11/21/1965, 56 y.o., male Today's Date: 11/07/2021  PCP: Dorna Mai, MD REFERRING PROVIDER: Cathlyn Parsons, PA-C    OT End of Session - 11/07/21 1022     Visit Number 7    Number of Visits 17    Date for OT Re-Evaluation 11/26/21    Authorization Type MCD potential - currently self pay    OT Start Time 1022    OT Stop Time 1103    OT Time Calculation (min) 41 min    Activity Tolerance Patient tolerated treatment well    Behavior During Therapy [facility] for tasks assessed/performed               Past Medical History:  Diagnosis Date   PUD (peptic ulcer disease) 2008   treated with meds   History reviewed. No pertinent surgical history. Patient Active Problem List   Diagnosis Date Noted   Acute ischemic left MCA stroke ([place]) 09/14/2021   Pressure injury of skin 09/11/2021   Stroke ([place]) 09/10/2021   Gastroesophageal reflux disease without esophagitis 08/24/2021   History of CVA (cerebrovascular accident) 08/11/2021   Dyslipidemia 01/24/2021   Essential hypertension    Hyponatremia    Polysubstance abuse ([place])    Cerebral thrombosis with cerebral infarction 01/11/2021   Acute CVA (cerebrovascular accident) ([place]) 01/11/2021   Right sided weakness 01/10/2021   AKI (acute kidney injury) ([place]) 01/10/2021   Tobacco abuse 01/10/2021   Cocaine abuse ([place]) 01/10/2021   Leukocytosis 01/10/2021   Cellulitis and abscess of upper arm and forearm 11/13/2012   Episodic tobacco abuse 11/13/2012    ONSET DATE: 09/10/21  REFERRING DIAG: O03.212 (ICD-10-CM) - Cerebral infarction due to unspecified occlusion or stenosis of left middle cerebral artery   THERAPY DIAG:  Hemiplegia and hemiparesis following cerebral infarction affecting right non-dominant side (HCC)  Other lack of coordination  Acute pain of right shoulder  Muscle weakness (generalized)  Rationale for  Evaluation and Treatment Rehabilitation  SUBJECTIVE:   SUBJECTIVE STATEMENT: Pt reports that he is trying to use R hand as much as possible.  Pt reports that R shoulder feels better (at end of session--no pain)  Pt accompanied by: self  PERTINENT HISTORY: 56 y.o. male presents to [facility] hospital on 09/10/2021 with acute R weakenss and slurred speech. CT head negative. Pt received TNK. MRI demonstrates posterior L MCA infarct. PMH includes HTN, HLD, CVA, PUD, hx of cocaine abuse.  PRECAUTIONS: Other: no driving  WEIGHT BEARING RESTRICTIONS No  PAIN:  Are you having pain? Yes: NPRS scale: 4-5/10 Pain location: R shoulder Pain description: dull Aggravating factors: lying on it and compensation Relieving factors: tylenol, stretching, proper positioning       OBJECTIVE:   HAND DOMINANCE: Left   TODAY'S TREATMENT:   Sitting, closed-chain shoulder flex and chest press with foam noodle with min cueing (verbal and tactile) for normal movement patterns.    Flipping cards with mod difficulty, min cueing for R shoulder compensation.  Removing cylinder wooden pegs (various sizes) to place in bowl with min cueing for hand positioning/avoiding shoulder hike initially.  Placing pegs back in pegboard with mod difficulty, improving with repetition and min cueing for normal movement patterns.  Pt with some beginning in-hand manipulation noted by inconsistently.    Picking up checkers and placing in bowl with min cueing, min-mod difficulty to use 2point pinch  Arm bike x 8 min (forward/backward) for reciprocal movement/conditioning without rest.  HOME EXERCISE PROGRAM: 10/11/21: initial HEP for RUE shoulder, hand, and simple coordination tasks 10/13/21: Recommendations on safety d/t lack of sensation Rt hand, and A/E recommendations   GOALS: Goals reviewed with patient? Yes  SHORT TERM GOALS: Target date: 10/28/21   Pt will be independent with HEP targeting RUE shoulder and Rt hand  coordination  Baseline: not yet addressed Goal status: IN PROGRESS  2.  Pt will improve RUE function as evidenced by performing Box & Blocks to 15 blocks Baseline: 5 blocks Goal status: ACHIEVED 16 blocks 10/27/21  3.  Pt will report pain Rt shoulder less than or equal to 3/10 with high level reaching Baseline: 5-6/10 Goal status: ACHIEVED reports 1-2/10 pain with flexion  4.  Pt will improve Rt hand grip strength to 24 lbs or greater Baseline: 14.3 lbs Goal status: IN PROGRESS 18.7 lbs 10/27/21  5.  Pt to verbalize understanding with safety and compensatory strategies for lack of sensation Rt hand Baseline: not yet addressed Goal status: MET  6.  Pt to verbalize understanding for task modifications and A/E needs to increase independence with ADLS (rocker knife, shoe buttons, etc)  Baseline: not yet addressed Goal status: IN PROGRESS   LONG TERM GOALS: Target date: 11/26/21  Independent with updated HEP PRN Baseline: Not yet addressed Goal status: INITIAL  2.  Pt to improve Rt hand function as evidenced by performing 9 hole peg test in under 2 minutes Baseline: unable at evaluation Goal status: INITIAL  3.  Pt to return to performing snack/sandwich/microwaveable items at mod I level Baseline: dependent Goal status: INITIAL  4.  Pt to return to light IADLS (making bed, folding laundry, etc) safely Baseline: dependent Goal status: INITIAL  5.  Pt to improve RUE function as evidenced by performing 25 blocks or Box & Blocks test Baseline: 5 blocks Goal status: INITIAL  6.  Pt to use Lt hand as assist for bilateral tasks 50% of time Baseline: unable Goal status: INITIAL  ASSESSMENT:  CLINICAL IMPRESSION: Pt is progressing towards goals with improved RUE functional use.  He needs cueing for positioning, but responds well to cueing and improves with repetition.   Pt with incr ease of grasping 1-inch objects today.    PERFORMANCE DEFICITS in functional skills including  ADLs, IADLs, coordination, dexterity, proprioception, sensation, edema, tone, ROM, strength, pain, FMC, body mechanics, decreased knowledge of use of DME, and UE functional use,   IMPAIRMENTS are limiting patient from ADLs and IADLs.   COMORBIDITIES may have co-morbidities  that affects occupational performance. Patient will benefit from skilled OT to address above impairments and improve overall function.  MODIFICATION OR ASSISTANCE TO COMPLETE EVALUATION: No modification of tasks or assist necessary to complete an evaluation.  OT OCCUPATIONAL PROFILE AND HISTORY: Problem focused assessment: Including review of records relating to presenting problem.  CLINICAL DECISION MAKING: Moderate - several treatment options, min-mod task modification necessary  REHAB POTENTIAL: Good  EVALUATION COMPLEXITY: Low    PLAN: OT FREQUENCY: 2x/week  OT DURATION: 8 weeks  PLANNED INTERVENTIONS: self care/ADL training, therapeutic exercise, therapeutic activity, neuromuscular re-education, manual therapy, passive range of motion, functional mobility training, splinting, electrical stimulation, moist heat, cryotherapy, patient/family education, energy conservation, coping strategies training, and DME and/or AE instructions  RECOMMENDED OTHER SERVICES: none  CONSULTED AND AGREED WITH PLAN OF CARE: Patient  PLAN FOR NEXT SESSION:  continue simple coordination and functional reaching RUE, grip strength as able, work on [employer], OT 11/07/2021, 10:59 AM  Vianne Bulls,  OTR/L [facility] [place], [place]  [id number] [phone] phone [phone] 11/07/21 10:59 AM

## 2021-11-10 ENCOUNTER — Encounter: Payer: Self-pay | Admitting: Occupational Therapy

## 2021-11-10 ENCOUNTER — Ambulatory Visit: Payer: Medicaid Other | Admitting: Occupational Therapy

## 2021-11-10 DIAGNOSIS — R278 Other lack of coordination: Secondary | ICD-10-CM

## 2021-11-10 DIAGNOSIS — M25511 Pain in right shoulder: Secondary | ICD-10-CM

## 2021-11-10 DIAGNOSIS — M6281 Muscle weakness (generalized): Secondary | ICD-10-CM

## 2021-11-10 DIAGNOSIS — I69353 Hemiplegia and hemiparesis following cerebral infarction affecting right non-dominant side: Secondary | ICD-10-CM

## 2021-11-10 NOTE — Therapy (Signed)
OUTPATIENT OCCUPATIONAL THERAPY NEURO TREATMENT  Patient Name: Christopher [name] MRN: [id number] DOB:1966-04-09, 56 y.o., male Today's Date: 11/10/2021  PCP: Dorna Mai, MD REFERRING PROVIDER: Cathlyn Parsons, PA-C    OT End of Session - 11/10/21 1110     Visit Number 8    Number of Visits 17    Date for OT Re-Evaluation 11/26/21    Authorization Type MCD potential - currently self pay    OT Start Time 1108    OT Stop Time 1150    OT Time Calculation (min) 42 min    Activity Tolerance Patient tolerated treatment well    Behavior During Therapy [facility] for tasks assessed/performed               Past Medical History:  Diagnosis Date   PUD (peptic ulcer disease) 2008   treated with meds   History reviewed. No pertinent surgical history. Patient Active Problem List   Diagnosis Date Noted   Acute ischemic left MCA stroke ([place]) 09/14/2021   Pressure injury of skin 09/11/2021   Stroke ([place]) 09/10/2021   Gastroesophageal reflux disease without esophagitis 08/24/2021   History of CVA (cerebrovascular accident) 08/11/2021   Dyslipidemia 01/24/2021   Essential hypertension    Hyponatremia    Polysubstance abuse ([place])    Cerebral thrombosis with cerebral infarction 01/11/2021   Acute CVA (cerebrovascular accident) ([place]) 01/11/2021   Right sided weakness 01/10/2021   AKI (acute kidney injury) ([place]) 01/10/2021   Tobacco abuse 01/10/2021   Cocaine abuse ([place]) 01/10/2021   Leukocytosis 01/10/2021   Cellulitis and abscess of upper arm and forearm 11/13/2012   Episodic tobacco abuse 11/13/2012    ONSET DATE: 09/10/21  REFERRING DIAG: J19.417 (ICD-10-CM) - Cerebral infarction due to unspecified occlusion or stenosis of left middle cerebral artery   THERAPY DIAG:  Hemiplegia and hemiparesis following cerebral infarction affecting right non-dominant side (HCC)  Other lack of coordination  Acute pain of right shoulder  Muscle weakness (generalized)  Rationale for  Evaluation and Treatment Rehabilitation  SUBJECTIVE:   SUBJECTIVE STATEMENT: Pt reports that he is trying to use R hand as much as possible.   Pt accompanied by: self  PERTINENT HISTORY: 56 y.o. male presents to [facility] hospital on 09/10/2021 with acute R weakenss and slurred speech. CT head negative. Pt received TNK. MRI demonstrates posterior L MCA infarct. PMH includes HTN, HLD, CVA, PUD, hx of cocaine abuse.  PRECAUTIONS: Other: no driving  WEIGHT BEARING RESTRICTIONS No  PAIN:  Are you having pain? Yes: NPRS scale: 0/10 Pain location: R shoulder Pain description: dull Aggravating factors: lying on it and compensation Relieving factors: tylenol, stretching, proper positioning       OBJECTIVE:   HAND DOMINANCE: Left   TODAY'S TREATMENT:   Sitting, closed-chain shoulder flex and chest press with fdowel with min cueing (verbal and tactile) for normal movement patterns.    Placing large pegs in pegboard on tabletop Rt hand w/ mod to max difficulty, and then removing.  Gripper set at level 1 resistance to pick up blocks Rt hand for sustained grip strength, control/coordination RUE w/ mod to max difficulty d/t decreased control RUE/hand and multiple slips of hand. Pt able to pinch yellow clothespins and place on antenna RUE w/ mod compensations and then remove  Arm bike x 8 min (forward/backward) for reciprocal movement/conditioning without rest.      HOME EXERCISE PROGRAM: 10/11/21: initial HEP for RUE shoulder, hand, and simple coordination tasks 10/13/21: Recommendations on safety d/t lack of  sensation Rt hand, and A/E recommendations   GOALS: Goals reviewed with patient? Yes  SHORT TERM GOALS: Target date: 10/28/21   Pt will be independent with HEP targeting RUE shoulder and Rt hand coordination  Baseline: not yet addressed Goal status: MET  2.  Pt will improve RUE function as evidenced by performing Box & Blocks to 15 blocks Baseline: 5 blocks Goal status: ACHIEVED 16  blocks 10/27/21  3.  Pt will report pain Rt shoulder less than or equal to 3/10 with high level reaching Baseline: 5-6/10 Goal status: ACHIEVED reports 1-2/10 pain with flexion  4.  Pt will improve Rt hand grip strength to 24 lbs or greater Baseline: 14.3 lbs Goal status: MET  18.7 lbs 10/27/21, 26 LBS 11/10/21  5.  Pt to verbalize understanding with safety and compensatory strategies for lack of sensation Rt hand Baseline: not yet addressed Goal status: MET  6.  Pt to verbalize understanding for task modifications and A/E needs to increase independence with ADLS (rocker knife, shoe buttons, etc)  Baseline: not yet addressed Goal status: DEFERRED, no longer needed   LONG TERM GOALS: Target date: 11/26/21  Independent with updated HEP PRN Baseline: Not yet addressed Goal status: INITIAL  2.  Pt to improve Rt hand function as evidenced by performing 9 hole peg test in under 2 minutes Baseline: unable at evaluation Goal status: INITIAL  3.  Pt to return to performing snack/sandwich/microwaveable items at mod I level Baseline: dependent Goal status: INITIAL  4.  Pt to return to light IADLS (making bed, folding laundry, etc) safely Baseline: dependent Goal status: INITIAL  5.  Pt to improve RUE function as evidenced by performing 25 blocks or Box & Blocks test Baseline: 5 blocks Goal status: INITIAL  6.  Pt to use Lt hand as assist for bilateral tasks 50% of time Baseline: unable Goal status: INITIAL  ASSESSMENT:  CLINICAL IMPRESSION: Pt has met all STG's at this time. Pt has improved in RUE grip strength, coordination and less compensations in shoulder. Pt still w/ difficulty controlling RUE/hand w/ functional tasks (combined grip and coordination tasks)   PERFORMANCE DEFICITS in functional skills including ADLs, IADLs, coordination, dexterity, proprioception, sensation, edema, tone, ROM, strength, pain, FMC, body mechanics, decreased knowledge of use of DME, and UE  functional use,   IMPAIRMENTS are limiting patient from ADLs and IADLs.   COMORBIDITIES may have co-morbidities  that affects occupational performance. Patient will benefit from skilled OT to address above impairments and improve overall function.  MODIFICATION OR ASSISTANCE TO COMPLETE EVALUATION: No modification of tasks or assist necessary to complete an evaluation.  OT OCCUPATIONAL PROFILE AND HISTORY: Problem focused assessment: Including review of records relating to presenting problem.  CLINICAL DECISION MAKING: Moderate - several treatment options, min-mod task modification necessary  REHAB POTENTIAL: Good  EVALUATION COMPLEXITY: Low    PLAN: OT FREQUENCY: 2x/week  OT DURATION: 8 weeks  PLANNED INTERVENTIONS: self care/ADL training, therapeutic exercise, therapeutic activity, neuromuscular re-education, manual therapy, passive range of motion, functional mobility training, splinting, electrical stimulation, moist heat, cryotherapy, patient/family education, energy conservation, coping strategies training, and DME and/or AE instructions  RECOMMENDED OTHER SERVICES: none  CONSULTED AND AGREED WITH PLAN OF CARE: Patient  PLAN FOR NEXT SESSION:  continue simple coordination and functional reaching RUE, grip strength as able, work on pinch   [employer], OT 11/10/2021, 11:11 AM

## 2021-11-14 ENCOUNTER — Ambulatory Visit: Payer: Medicaid Other | Admitting: Occupational Therapy

## 2021-11-14 ENCOUNTER — Encounter: Payer: Self-pay | Admitting: Occupational Therapy

## 2021-11-14 DIAGNOSIS — M6281 Muscle weakness (generalized): Secondary | ICD-10-CM

## 2021-11-14 DIAGNOSIS — I69353 Hemiplegia and hemiparesis following cerebral infarction affecting right non-dominant side: Secondary | ICD-10-CM

## 2021-11-14 DIAGNOSIS — R278 Other lack of coordination: Secondary | ICD-10-CM

## 2021-11-14 DIAGNOSIS — M25511 Pain in right shoulder: Secondary | ICD-10-CM

## 2021-11-14 NOTE — Therapy (Signed)
OUTPATIENT OCCUPATIONAL THERAPY NEURO TREATMENT  Patient Name: Christopher [name] MRN: [id number] DOB:May 23, 1965, 56 y.o., male Today's Date: 11/14/2021  PCP: Dorna Mai, MD REFERRING PROVIDER: Cathlyn Parsons, PA-C    OT End of Session - 11/14/21 1107     Visit Number 9    Number of Visits 17    Date for OT Re-Evaluation 11/26/21    Authorization Type MCD potential - currently self pay    OT Start Time 1107    OT Stop Time 1147    OT Time Calculation (min) 40 min    Activity Tolerance Patient tolerated treatment well    Behavior During Therapy [facility] for tasks assessed/performed               Past Medical History:  Diagnosis Date   PUD (peptic ulcer disease) 2008   treated with meds   History reviewed. No pertinent surgical history. Patient Active Problem List   Diagnosis Date Noted   Acute ischemic left MCA stroke ([place]) 09/14/2021   Pressure injury of skin 09/11/2021   Stroke ([place]) 09/10/2021   Gastroesophageal reflux disease without esophagitis 08/24/2021   History of CVA (cerebrovascular accident) 08/11/2021   Dyslipidemia 01/24/2021   Essential hypertension    Hyponatremia    Polysubstance abuse ([place])    Cerebral thrombosis with cerebral infarction 01/11/2021   Acute CVA (cerebrovascular accident) ([place]) 01/11/2021   Right sided weakness 01/10/2021   AKI (acute kidney injury) ([place]) 01/10/2021   Tobacco abuse 01/10/2021   Cocaine abuse ([place]) 01/10/2021   Leukocytosis 01/10/2021   Cellulitis and abscess of upper arm and forearm 11/13/2012   Episodic tobacco abuse 11/13/2012    ONSET DATE: 09/10/21  REFERRING DIAG: J88.416 (ICD-10-CM) - Cerebral infarction due to unspecified occlusion or stenosis of left middle cerebral artery   THERAPY DIAG:  Hemiplegia and hemiparesis following cerebral infarction affecting right non-dominant side (HCC)  Other lack of coordination  Acute pain of right shoulder  Muscle weakness (generalized)  Rationale for  Evaluation and Treatment Rehabilitation  SUBJECTIVE:   SUBJECTIVE STATEMENT: Pt reports things are going well. Pt accompanied by: self  PERTINENT HISTORY: 56 y.o. male presents to [facility] hospital on 09/10/2021 with acute R weakenss and slurred speech. CT head negative. Pt received TNK. MRI demonstrates posterior L MCA infarct. PMH includes HTN, HLD, CVA, PUD, hx of cocaine abuse.  PRECAUTIONS: Other: no driving  WEIGHT BEARING RESTRICTIONS No  PAIN:  Are you having pain? Yes: NPRS scale: 0/10 Pain location: R shoulder Pain description: dull Aggravating factors: lying on it and compensation Relieving factors: tylenol, stretching, proper positioning       OBJECTIVE:   HAND DOMINANCE: Left   TODAY'S TREATMENT:   11/14/21  RUE Functional coordination with grasping, picking up and placing color dowels/pegs into bowl to right and then manipulating to place into board.  Digitflex 3.0 x 10 reps with RUE, composite flexion Medball grip 2.2 lbs with RUE with elbow on table to isolate grasp and elbow x 5 with mod difficulty Jumbo Cards - flipping with RUE with min difficulty with pullin top card out to get thumb under Arm Bike: for conditioning and reciprocal movements on Level 1 for 8 minutes (forward and backward)      HOME EXERCISE PROGRAM: 10/11/21: initial HEP for RUE shoulder, hand, and simple coordination tasks 10/13/21: Recommendations on safety d/t lack of sensation Rt hand, and A/E recommendations   GOALS: Goals reviewed with patient? Yes  SHORT TERM GOALS: Target date: 10/28/21  Pt will be independent with HEP targeting RUE shoulder and Rt hand coordination  Baseline: not yet addressed Goal status: MET  2.  Pt will improve RUE function as evidenced by performing Box & Blocks to 15 blocks Baseline: 5 blocks Goal status: ACHIEVED 16 blocks 10/27/21  3.  Pt will report pain Rt shoulder less than or equal to 3/10 with high level reaching Baseline: 5-6/10 Goal status:  ACHIEVED reports 1-2/10 pain with flexion  4.  Pt will improve Rt hand grip strength to 24 lbs or greater Baseline: 14.3 lbs Goal status: MET  18.7 lbs 10/27/21, 26 LBS 11/10/21  5.  Pt to verbalize understanding with safety and compensatory strategies for lack of sensation Rt hand Baseline: not yet addressed Goal status: MET  6.  Pt to verbalize understanding for task modifications and A/E needs to increase independence with ADLS (rocker knife, shoe buttons, etc)  Baseline: not yet addressed Goal status: DEFERRED, no longer needed   LONG TERM GOALS: Target date: 11/26/21  Independent with updated HEP PRN Baseline: Not yet addressed Goal status: INITIAL  2.  Pt to improve Rt hand function as evidenced by performing 9 hole peg test in under 2 minutes Baseline: unable at evaluation Goal status: INITIAL  3.  Pt to return to performing snack/sandwich/microwaveable items at mod I level Baseline: dependent Goal status: INITIAL  4.  Pt to return to light IADLS (making bed, folding laundry, etc) safely Baseline: dependent Goal status: INITIAL  5.  Pt to improve RUE function as evidenced by performing 25 blocks or Box & Blocks test Baseline: 5 blocks Goal status: INITIAL  6.  Pt to use Lt hand as assist for bilateral tasks 50% of time Baseline: unable Goal status: INITIAL  ASSESSMENT:  CLINICAL IMPRESSION: Pt continues to demonstrate compliance with HEP and motivation towards returning to PLOF. Increased coordination and reports increased sensation in [place] in functional skills including ADLs, IADLs, coordination, dexterity, proprioception, sensation, edema, tone, ROM, strength, pain, FMC, body mechanics, decreased knowledge of use of DME, and UE functional use,   IMPAIRMENTS are limiting patient from ADLs and IADLs.   COMORBIDITIES may have co-morbidities  that affects occupational performance. Patient will benefit from skilled OT to address above impairments  and improve overall function.  MODIFICATION OR ASSISTANCE TO COMPLETE EVALUATION: No modification of tasks or assist necessary to complete an evaluation.  OT OCCUPATIONAL PROFILE AND HISTORY: Problem focused assessment: Including review of records relating to presenting problem.  CLINICAL DECISION MAKING: Moderate - several treatment options, min-mod task modification necessary  REHAB POTENTIAL: Good  EVALUATION COMPLEXITY: Low    PLAN: OT FREQUENCY: 2x/week  OT DURATION: 8 weeks  PLANNED INTERVENTIONS: self care/ADL training, therapeutic exercise, therapeutic activity, neuromuscular re-education, manual therapy, passive range of motion, functional mobility training, splinting, electrical stimulation, moist heat, cryotherapy, patient/family education, energy conservation, coping strategies training, and DME and/or AE instructions  RECOMMENDED OTHER SERVICES: none  CONSULTED AND AGREED WITH PLAN OF CARE: Patient  PLAN FOR NEXT SESSION:  continue simple coordination and functional reaching RUE, grip strength as able, work on [employer], OT 11/14/2021, 12:52 PM

## 2021-11-17 ENCOUNTER — Encounter: Payer: Self-pay | Admitting: Occupational Therapy

## 2021-11-17 ENCOUNTER — Ambulatory Visit: Payer: Medicaid Other | Admitting: Occupational Therapy

## 2021-11-17 DIAGNOSIS — I69353 Hemiplegia and hemiparesis following cerebral infarction affecting right non-dominant side: Secondary | ICD-10-CM | POA: Diagnosis not present

## 2021-11-17 DIAGNOSIS — M6281 Muscle weakness (generalized): Secondary | ICD-10-CM

## 2021-11-17 DIAGNOSIS — R278 Other lack of coordination: Secondary | ICD-10-CM

## 2021-11-17 NOTE — Therapy (Signed)
OUTPATIENT OCCUPATIONAL THERAPY NEURO TREATMENT  Patient Name: Christopher [name] MRN: [id number] DOB:05-15-1965, 56 y.o., male Today's Date: 11/17/2021  PCP: Dorna Mai, MD REFERRING PROVIDER: Cathlyn Parsons, PA-C    OT End of Session - 11/17/21 1106     Visit Number 10    Number of Visits 17    Date for OT Re-Evaluation 11/26/21    Authorization Type MCD potential - currently self pay    OT Start Time 1102    OT Stop Time 1145    OT Time Calculation (min) 43 min    Activity Tolerance Patient tolerated treatment well    Behavior During Therapy [facility] for tasks assessed/performed               Past Medical History:  Diagnosis Date   PUD (peptic ulcer disease) 2008   treated with meds   History reviewed. No pertinent surgical history. Patient Active Problem List   Diagnosis Date Noted   Acute ischemic left MCA stroke ([place]) 09/14/2021   Pressure injury of skin 09/11/2021   Stroke ([place]) 09/10/2021   Gastroesophageal reflux disease without esophagitis 08/24/2021   History of CVA (cerebrovascular accident) 08/11/2021   Dyslipidemia 01/24/2021   Essential hypertension    Hyponatremia    Polysubstance abuse ([place])    Cerebral thrombosis with cerebral infarction 01/11/2021   Acute CVA (cerebrovascular accident) ([place]) 01/11/2021   Right sided weakness 01/10/2021   AKI (acute kidney injury) ([place]) 01/10/2021   Tobacco abuse 01/10/2021   Cocaine abuse ([place]) 01/10/2021   Leukocytosis 01/10/2021   Cellulitis and abscess of upper arm and forearm 11/13/2012   Episodic tobacco abuse 11/13/2012    ONSET DATE: 09/10/21  REFERRING DIAG: J50.093 (ICD-10-CM) - Cerebral infarction due to unspecified occlusion or stenosis of left middle cerebral artery   THERAPY DIAG:  Hemiplegia and hemiparesis following cerebral infarction affecting right non-dominant side (HCC)  Other lack of coordination  Muscle weakness (generalized)  Rationale for Evaluation and Treatment  Rehabilitation  SUBJECTIVE:   SUBJECTIVE STATEMENT: Pt reports things are going well. Pt accompanied by: self  PERTINENT HISTORY: 56 y.o. male presents to [facility] hospital on 09/10/2021 with acute R weakenss and slurred speech. CT head negative. Pt received TNK. MRI demonstrates posterior L MCA infarct. PMH includes HTN, HLD, CVA, PUD, hx of cocaine abuse.  PRECAUTIONS: Other: no driving  WEIGHT BEARING RESTRICTIONS No  PAIN:  Are you having pain? Yes: NPRS scale: 0/10 Pain location: R shoulder Pain description: dull Aggravating factors: lying on it and compensation Relieving factors: tylenol, stretching, proper positioning       OBJECTIVE:   HAND DOMINANCE: Left   TODAY'S TREATMENT:   11/17/21  Gripper set at level 1 resistance to pick up blocks Rt hand for sustained grip strength, control and coordination of Rt hand/UE, and proprioception - w/ mod to max difficulty and hand slipping off gripper requiring repositioning several times  Placing white and red pegs in semicircular pegboard and then removing RUE w/ mod difficulty and drops.   Flipping large cards over Rt hand (1/2 deck) w/ min to mod difficulty and assist from Lt hand.   Standing: BUE sh flexion along wall w/ physioball w/ cues to move slower for control of RUE. Progressed to maintaining ball at wall w/ RUE at 90* sh flex and performing small circumduction ex's clockwise w/ cues to prevent sh IR.   Prone: scapula retraction w/ mod verbal and tactile cues needed to perform correctly. Pt improved w/ repetition.  Prone  on elbows: chest lift for scapula depression and sh girdle strengthening/stability X 5 reps  UBE x 5 min, level 3 for normal reciprocal movement pattern and UB conditioning    HOME EXERCISE PROGRAM: 10/11/21: initial HEP for RUE shoulder, hand, and simple coordination tasks 10/13/21: Recommendations on safety d/t lack of sensation Rt hand, and A/E recommendations   GOALS: Goals reviewed with patient?  Yes  SHORT TERM GOALS: Target date: 10/28/21   Pt will be independent with HEP targeting RUE shoulder and Rt hand coordination  Baseline: not yet addressed Goal status: MET  2.  Pt will improve RUE function as evidenced by performing Box & Blocks to 15 blocks Baseline: 5 blocks Goal status: ACHIEVED 16 blocks 10/27/21  3.  Pt will report pain Rt shoulder less than or equal to 3/10 with high level reaching Baseline: 5-6/10 Goal status: ACHIEVED reports 1-2/10 pain with flexion  4.  Pt will improve Rt hand grip strength to 24 lbs or greater Baseline: 14.3 lbs Goal status: MET  18.7 lbs 10/27/21, 26 LBS 11/10/21  5.  Pt to verbalize understanding with safety and compensatory strategies for lack of sensation Rt hand Baseline: not yet addressed Goal status: MET  6.  Pt to verbalize understanding for task modifications and A/E needs to increase independence with ADLS (rocker knife, shoe buttons, etc)  Baseline: not yet addressed Goal status: DEFERRED, no longer needed   LONG TERM GOALS: Target date: 11/26/21  Independent with updated HEP PRN Baseline: Not yet addressed Goal status: INITIAL  2.  Pt to improve Rt hand function as evidenced by performing 9 hole peg test in under 2 minutes Baseline: unable at evaluation Goal status: IN PROGRESS  3.  Pt to return to performing snack/sandwich/microwaveable items at mod I level Baseline: dependent Goal status: INITIAL  4.  Pt to return to light IADLS (making bed, folding laundry, etc) safely Baseline: dependent Goal status: INITIAL  5.  Pt to improve RUE function as evidenced by performing 25 blocks or Box & Blocks test Baseline: 5 blocks Goal status: IN PROGRESS  6.  Pt to use Rt hand as assist for bilateral tasks 50% of time Baseline: unable Goal status: IN PROGRESS  ASSESSMENT:  CLINICAL IMPRESSION: Pt continues to demonstrate compliance with HEP and motivation towards returning to PLOF. Increased coordination and reports  increased sensation in RUE. Pt still lacks proximal strength and distal control  PERFORMANCE DEFICITS in functional skills including ADLs, IADLs, coordination, dexterity, proprioception, sensation, edema, tone, ROM, strength, pain, FMC, body mechanics, decreased knowledge of use of DME, and UE functional use,   IMPAIRMENTS are limiting patient from ADLs and IADLs.   COMORBIDITIES may have co-morbidities  that affects occupational performance. Patient will benefit from skilled OT to address above impairments and improve overall function.  MODIFICATION OR ASSISTANCE TO COMPLETE EVALUATION: No modification of tasks or assist necessary to complete an evaluation.  OT OCCUPATIONAL PROFILE AND HISTORY: Problem focused assessment: Including review of records relating to presenting problem.  CLINICAL DECISION MAKING: Moderate - several treatment options, min-mod task modification necessary  REHAB POTENTIAL: Good  EVALUATION COMPLEXITY: Low    PLAN: OT FREQUENCY: 2x/week  OT DURATION: 8 weeks  PLANNED INTERVENTIONS: self care/ADL training, therapeutic exercise, therapeutic activity, neuromuscular re-education, manual therapy, passive range of motion, functional mobility training, splinting, electrical stimulation, moist heat, cryotherapy, patient/family education, energy conservation, coping strategies training, and DME and/or AE instructions  RECOMMENDED OTHER SERVICES: none  CONSULTED AND AGREED WITH PLAN OF CARE: Patient  PLAN FOR NEXT SESSION:  continue simple coordination and functional reaching RUE, continue closed chain NMR RUE   Hans Eden, OT 11/17/2021, 11:07 AM

## 2021-11-21 ENCOUNTER — Encounter: Payer: Self-pay | Admitting: Occupational Therapy

## 2021-11-21 ENCOUNTER — Ambulatory Visit: Payer: Medicaid Other | Admitting: Occupational Therapy

## 2021-11-21 DIAGNOSIS — I69353 Hemiplegia and hemiparesis following cerebral infarction affecting right non-dominant side: Secondary | ICD-10-CM

## 2021-11-21 DIAGNOSIS — R278 Other lack of coordination: Secondary | ICD-10-CM

## 2021-11-21 DIAGNOSIS — M6281 Muscle weakness (generalized): Secondary | ICD-10-CM

## 2021-11-21 NOTE — Therapy (Signed)
OUTPATIENT OCCUPATIONAL THERAPY NEURO TREATMENT  Patient Name: Christopher [name] MRN: [id number] DOB:July 28, 1965, 56 y.o., male Today's Date: 11/21/2021  PCP: Dorna Mai, MD REFERRING PROVIDER: Cathlyn Parsons, PA-C    OT End of Session - 11/21/21 1109     Visit Number 11    Number of Visits 17    Date for OT Re-Evaluation 11/26/21    Authorization Type MCD potential - currently self pay    OT Start Time 1105    OT Stop Time 1145    OT Time Calculation (min) 40 min    Activity Tolerance Patient tolerated treatment well    Behavior During Therapy [facility] for tasks assessed/performed               Past Medical History:  Diagnosis Date   PUD (peptic ulcer disease) 2008   treated with meds   History reviewed. No pertinent surgical history. Patient Active Problem List   Diagnosis Date Noted   Acute ischemic left MCA stroke ([place]) 09/14/2021   Pressure injury of skin 09/11/2021   Stroke ([place]) 09/10/2021   Gastroesophageal reflux disease without esophagitis 08/24/2021   History of CVA (cerebrovascular accident) 08/11/2021   Dyslipidemia 01/24/2021   Essential hypertension    Hyponatremia    Polysubstance abuse ([place])    Cerebral thrombosis with cerebral infarction 01/11/2021   Acute CVA (cerebrovascular accident) ([place]) 01/11/2021   Right sided weakness 01/10/2021   AKI (acute kidney injury) ([place]) 01/10/2021   Tobacco abuse 01/10/2021   Cocaine abuse ([place]) 01/10/2021   Leukocytosis 01/10/2021   Cellulitis and abscess of upper arm and forearm 11/13/2012   Episodic tobacco abuse 11/13/2012    ONSET DATE: 09/10/21  REFERRING DIAG: T01.601 (ICD-10-CM) - Cerebral infarction due to unspecified occlusion or stenosis of left middle cerebral artery   THERAPY DIAG:  Hemiplegia and hemiparesis following cerebral infarction affecting right non-dominant side (HCC)  Other lack of coordination  Muscle weakness (generalized)  Rationale for Evaluation and Treatment  Rehabilitation  SUBJECTIVE:   SUBJECTIVE STATEMENT: Pt reports things are going well. Pt accompanied by: self  PERTINENT HISTORY: 56 y.o. male presents to [facility] hospital on 09/10/2021 with acute R weakenss and slurred speech. CT head negative. Pt received TNK. MRI demonstrates posterior L MCA infarct. PMH includes HTN, HLD, CVA, PUD, hx of cocaine abuse.  PRECAUTIONS: Other: no driving  WEIGHT BEARING RESTRICTIONS No  PAIN:  Are you having pain? Yes: NPRS scale: 0/10 Pain location: R shoulder Pain description: dull Aggravating factors: lying on it and compensation Relieving factors: tylenol, stretching, proper positioning       OBJECTIVE:   HAND DOMINANCE: Left   TODAY'S TREATMENT:   11/21/21   Placing large pegs in pegboard and then removing RUE w/ mod difficulty and drops.   Assessed progress towards goals in prep for renewal next session - see goal section  Standing: BUE sh flexion along wall w/ physioball w/ cues to move slower for control of RUE. Progressed to maintaining ball at wall w/ RUE at 90* sh flex and performing small circumduction ex's clockwise w/ cues to prevent sh IR.  Seated: BUE sh flexion to 90* holding 1 lb weight and min cues for posture and prevent sh hiking  UBE x 8 min, level 3 for normal reciprocal movement pattern and UB conditioning    HOME EXERCISE PROGRAM: 10/11/21: initial HEP for RUE shoulder, hand, and simple coordination tasks 10/13/21: Recommendations on safety d/t lack of sensation Rt hand, and A/E recommendations   GOALS:  Goals reviewed with patient? Yes  SHORT TERM GOALS: Target date: 10/28/21   Pt will be independent with HEP targeting RUE shoulder and Rt hand coordination  Baseline: not yet addressed Goal status: ACHIEVED  2.  Pt will improve RUE function as evidenced by performing Box & Blocks to 15 blocks Baseline: 5 blocks Goal status: ACHIEVED 16 blocks 10/27/21  3.  Pt will report pain Rt shoulder less than or equal to  3/10 with high level reaching Baseline: 5-6/10 Goal status: ACHIEVED reports 1-2/10 pain with flexion  4.  Pt will improve Rt hand grip strength to 24 lbs or greater Baseline: 14.3 lbs Goal status: ACHIEVED  18.7 lbs 10/27/21, 26 LBS 11/10/21  5.  Pt to verbalize understanding with safety and compensatory strategies for lack of sensation Rt hand Baseline: not yet addressed Goal status: ACHIEVED  6.  Pt to verbalize understanding for task modifications and A/E needs to increase independence with ADLS (rocker knife, shoe buttons, etc)  Baseline: not yet addressed Goal status: DEFERRED, no longer needed   LONG TERM GOALS: Target date: 11/26/21  Independent with updated HEP PRN Baseline: Not yet addressed Goal status: IN PROGRESS  2.  Pt to improve Rt hand function as evidenced by performing 9 hole peg test in under 2 minutes Baseline: unable at evaluation Goal status: IN PROGRESS (11/21/21: placed 2 pegs in 2 min)   3.  Pt to return to performing snack/sandwich/microwaveable items at mod I level Baseline: dependent Goal status: INITIAL  4.  Pt to return to light IADLS (making bed, folding laundry, etc) safely Baseline: dependent Goal status: INITIAL  5.  Pt to improve RUE function as evidenced by performing 25 blocks or Box & Blocks test Baseline: 5 blocks Goal status: IN PROGRESS (11/21/21: 17 blocks)   6.  Pt to use Rt hand as assist for bilateral tasks 50% of time Baseline: unable Goal status: IN PROGRESS  ASSESSMENT:  CLINICAL IMPRESSION: Pt continues to demonstrate compliance with HEP and motivation towards returning to PLOF. Increased coordination and reports increased sensation in RUE, however still limited. Pt still lacks proximal strength and distal control and would benefit from renewal at next visit. Pt has met all STG's  PERFORMANCE DEFICITS in functional skills including ADLs, IADLs, coordination, dexterity, proprioception, sensation, edema, tone, ROM, strength,  pain, FMC, body mechanics, decreased knowledge of use of DME, and UE functional use,   IMPAIRMENTS are limiting patient from ADLs and IADLs.   COMORBIDITIES may have co-morbidities  that affects occupational performance. Patient will benefit from skilled OT to address above impairments and improve overall function.  MODIFICATION OR ASSISTANCE TO COMPLETE EVALUATION: No modification of tasks or assist necessary to complete an evaluation.  OT OCCUPATIONAL PROFILE AND HISTORY: Problem focused assessment: Including review of records relating to presenting problem.  CLINICAL DECISION MAKING: Moderate - several treatment options, min-mod task modification necessary  REHAB POTENTIAL: Good  EVALUATION COMPLEXITY: Low    PLAN: OT FREQUENCY: 2x/week  OT DURATION: 8 weeks  PLANNED INTERVENTIONS: self care/ADL training, therapeutic exercise, therapeutic activity, neuromuscular re-education, manual therapy, passive range of motion, functional mobility training, splinting, electrical stimulation, moist heat, cryotherapy, patient/family education, energy conservation, coping strategies training, and DME and/or AE instructions  RECOMMENDED OTHER SERVICES: none  CONSULTED AND AGREED WITH PLAN OF CARE: Patient  PLAN FOR NEXT SESSION:  Check remaining goals and renew for 2x/wk x 6 weeks (may only come for 4 more weeks per pt request), Connect 4, gripper, UBE   [employer]  Viviano Simas, OT 11/21/2021, 11:11 AM

## 2021-11-24 ENCOUNTER — Ambulatory Visit: Payer: Medicaid Other | Admitting: Occupational Therapy

## 2021-11-24 ENCOUNTER — Encounter: Payer: Self-pay | Admitting: Occupational Therapy

## 2021-11-24 DIAGNOSIS — I69353 Hemiplegia and hemiparesis following cerebral infarction affecting right non-dominant side: Secondary | ICD-10-CM

## 2021-11-24 DIAGNOSIS — R278 Other lack of coordination: Secondary | ICD-10-CM

## 2021-11-24 DIAGNOSIS — M25511 Pain in right shoulder: Secondary | ICD-10-CM

## 2021-11-24 DIAGNOSIS — M6281 Muscle weakness (generalized): Secondary | ICD-10-CM

## 2021-11-24 NOTE — Therapy (Signed)
OUTPATIENT OCCUPATIONAL THERAPY NEURO TREATMENT  Patient Name: Christopher [name] MRN: [id number] DOB:1966-02-04, 56 y.o., male Today's Date: 11/24/2021  PCP: Dorna Mai, MD REFERRING PROVIDER: Cathlyn Parsons, PA-C    OT End of Session - 11/24/21 1107     Visit Number 12    Number of Visits 24    Date for OT Re-Evaluation 01/10/22    Authorization Type MCD potential - currently self pay    OT Start Time 1104    OT Stop Time 1145    OT Time Calculation (min) 41 min    Activity Tolerance Patient tolerated treatment well    Behavior During Therapy [facility] for tasks assessed/performed               Past Medical History:  Diagnosis Date   PUD (peptic ulcer disease) 2008   treated with meds   History reviewed. No pertinent surgical history. Patient Active Problem List   Diagnosis Date Noted   Acute ischemic left MCA stroke ([place]) 09/14/2021   Pressure injury of skin 09/11/2021   Stroke ([place]) 09/10/2021   Gastroesophageal reflux disease without esophagitis 08/24/2021   History of CVA (cerebrovascular accident) 08/11/2021   Dyslipidemia 01/24/2021   Essential hypertension    Hyponatremia    Polysubstance abuse ([place])    Cerebral thrombosis with cerebral infarction 01/11/2021   Acute CVA (cerebrovascular accident) ([place]) 01/11/2021   Right sided weakness 01/10/2021   AKI (acute kidney injury) ([place]) 01/10/2021   Tobacco abuse 01/10/2021   Cocaine abuse ([place]) 01/10/2021   Leukocytosis 01/10/2021   Cellulitis and abscess of upper arm and forearm 11/13/2012   Episodic tobacco abuse 11/13/2012    ONSET DATE: 09/10/21  REFERRING DIAG: T55.732 (ICD-10-CM) - Cerebral infarction due to unspecified occlusion or stenosis of left middle cerebral artery   THERAPY DIAG:  Hemiplegia and hemiparesis following cerebral infarction affecting right non-dominant side (HCC)  Other lack of coordination  Muscle weakness (generalized)  Acute pain of right shoulder  Rationale for  Evaluation and Treatment Rehabilitation  SUBJECTIVE:   SUBJECTIVE STATEMENT: I woke up w/ pain in my Rt shoulder, but it's better now Pt accompanied by: self  PERTINENT HISTORY: 56 y.o. male presents to [facility] hospital on 09/10/2021 with acute R weakenss and slurred speech. CT head negative. Pt received TNK. MRI demonstrates posterior L MCA infarct. PMH includes HTN, HLD, CVA, PUD, hx of cocaine abuse.  PRECAUTIONS: Other: no driving  WEIGHT BEARING RESTRICTIONS No  PAIN:  Are you having pain? Yes: NPRS scale: 0/10 Pain location: R shoulder Pain description: dull Aggravating factors: lying on it and compensation Relieving factors: tylenol, stretching, proper positioning       OBJECTIVE:   HAND DOMINANCE: Left   TODAY'S TREATMENT:   11/24/21   Checked LTG's for renewal (completed today). Pt issued bed positioning handout and reviewed since pt reported pain Rt shoulder this morning when he woke up   Practiced carrying laundry basket (BUE's) around gym then practiced folding towels and long sleeve shirt for bilateral integration and functional use RUE  Gripper set at level 1 resistance Rt hand to pick up blocks for grip strength, coordination and control entire RUE w/ mod drops/difficulty, and rest break  Pt picking up checkers and placing in Connect 4 slots RUE (shelf liner under checkers) for functional reaching and coordination  UBE x 8 min, level 3 for normal reciprocal movement pattern and UB conditioning    HOME EXERCISE PROGRAM: 10/11/21: initial HEP for RUE shoulder, hand, and simple  coordination tasks 10/13/21: Recommendations on safety d/t lack of sensation Rt hand, and A/E recommendations 11/24/21: bed positioning handout  GOALS: Goals reviewed with patient? Yes  SHORT TERM GOALS: Target date: 10/28/21   Pt will be independent with HEP targeting RUE shoulder and Rt hand coordination  Baseline: not yet addressed Goal status: ACHIEVED  2.  Pt will improve RUE  function as evidenced by performing Box & Blocks to 15 blocks Baseline: 5 blocks Goal status: ACHIEVED 16 blocks 10/27/21  3.  Pt will report pain Rt shoulder less than or equal to 3/10 with high level reaching Baseline: 5-6/10 Goal status: ACHIEVED reports 1-2/10 pain with flexion  4.  Pt will improve Rt hand grip strength to 24 lbs or greater Baseline: 14.3 lbs Goal status: ACHIEVED  18.7 lbs 10/27/21, 26 LBS 11/10/21  5.  Pt to verbalize understanding with safety and compensatory strategies for lack of sensation Rt hand Baseline: not yet addressed Goal status: ACHIEVED  6.  Pt to verbalize understanding for task modifications and A/E needs to increase independence with ADLS (rocker knife, shoe buttons, etc)  Baseline: not yet addressed Goal status: DEFERRED, no longer needed   LONG TERM GOALS: Target date: 11/26/21  Independent with updated HEP PRN Baseline: Not yet addressed Goal status: IN PROGRESS  2.  Pt to improve Rt hand function as evidenced by performing 9 hole peg test in under 2 minutes Baseline: unable at evaluation Goal status: IN PROGRESS (11/21/21: placed 2 pegs in 2 min)   3.  Pt to return to performing snack/sandwich/microwaveable items at mod I level Baseline: dependent Goal status: MET  4.  Pt to return to light IADLS (making bed, folding laundry, etc) safely Baseline: dependent Goal status: IN PROGRESS (demo folding towels in clinic)   5.  Pt to improve RUE function as evidenced by performing 25 blocks or Box & Blocks test Baseline: 5 blocks Goal status: IN PROGRESS (11/21/21: 17 blocks)   6.  Pt to use Rt hand as assist for bilateral tasks 50% of time Baseline: unable Goal status: IN PROGRESS  ASSESSMENT:  CLINICAL IMPRESSION: Pt continues to demonstrate compliance with HEP and motivation towards returning to PLOF. Increased coordination and reports increased sensation in RUE, however still limited. Pt still lacks proximal strength and distal control  and would benefit from renewal. Pt has met all STG's and 1 LTG. Pt progressing towards remaining LTG's and would benefit from continued skilled O.T. to address remaining goals and maximize rehab potential re: RUE functional use, coordination, and strength.   PERFORMANCE DEFICITS in functional skills including ADLs, IADLs, coordination, dexterity, proprioception, sensation, edema, tone, ROM, strength, pain, FMC, body mechanics, decreased knowledge of use of DME, and UE functional use,   IMPAIRMENTS are limiting patient from ADLs and IADLs.   COMORBIDITIES may have co-morbidities  that affects occupational performance. Patient will benefit from skilled OT to address above impairments and improve overall function.  MODIFICATION OR ASSISTANCE TO COMPLETE EVALUATION: No modification of tasks or assist necessary to complete an evaluation.  OT OCCUPATIONAL PROFILE AND HISTORY: Problem focused assessment: Including review of records relating to presenting problem.  CLINICAL DECISION MAKING: Moderate - several treatment options, min-mod task modification necessary  REHAB POTENTIAL: Good  EVALUATION COMPLEXITY: Low    PLAN: OT FREQUENCY: 2x/week  OT DURATION: additional 6 weeks   PLANNED INTERVENTIONS: self care/ADL training, therapeutic exercise, therapeutic activity, neuromuscular re-education, manual therapy, passive range of motion, functional mobility training, splinting, electrical stimulation, moist heat, cryotherapy, patient/family  education, energy conservation, coping strategies training, and DME and/or AE instructions  RECOMMENDED OTHER SERVICES: none  CONSULTED AND AGREED WITH PLAN OF CARE: Patient  PLAN FOR NEXT SESSION:  renewal completed today for additional 4-6 weeks of O.T.  Continue working on RUE functional use, strength, and coordination   [employer], OT 11/24/2021, 11:50 AM

## 2021-11-30 ENCOUNTER — Ambulatory Visit (INDEPENDENT_AMBULATORY_CARE_PROVIDER_SITE_OTHER): Payer: Self-pay | Admitting: Neurology

## 2021-11-30 ENCOUNTER — Ambulatory Visit: Payer: Self-pay

## 2021-11-30 ENCOUNTER — Telehealth: Payer: Self-pay | Admitting: Neurology

## 2021-11-30 ENCOUNTER — Encounter: Payer: Self-pay | Admitting: Neurology

## 2021-11-30 VITALS — BP 179/94 | HR 102 | Ht 69.0 in | Wt 141.0 lb

## 2021-11-30 DIAGNOSIS — F17210 Nicotine dependence, cigarettes, uncomplicated: Secondary | ICD-10-CM

## 2021-11-30 DIAGNOSIS — I672 Cerebral atherosclerosis: Secondary | ICD-10-CM

## 2021-11-30 DIAGNOSIS — R471 Dysarthria and anarthria: Secondary | ICD-10-CM

## 2021-11-30 DIAGNOSIS — R29898 Other symptoms and signs involving the musculoskeletal system: Secondary | ICD-10-CM

## 2021-11-30 DIAGNOSIS — F1410 Cocaine abuse, uncomplicated: Secondary | ICD-10-CM

## 2021-11-30 NOTE — Progress Notes (Signed)
[facility] [address] [place]. [place] [address] [phone]       OFFICE FOLLOW-UP NOTE  [name] Date of Birth:  April 29, 1966 Medical Record Number:  [id number]   HPI: Christopher [name] is a 56 year old Caucasian male seen today for initial office follow-up visit following hospital admission for stroke in May 2023.  History is obtained from the patient and review of electronic medical records.  I have personally reviewed pertinent available imaging films in PACS.  He has past medical history of hypertension, hyperlipidemia, peripheral vascular disease, stroke and history of cocaine, tobacco and marijuana abuse who presented to [facility], ED on 09/10/2021 with sudden onset of slurred speech and right-sided weakness.  NIH stroke scale on admission was 5.  Code stroke was called and CT head on admission showed an old right occipital infarct which was new compared to previous CT from September.  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  He was treated with thrombolysis with and kept in the ICU with close neurological monitoring and strict blood pressure control.  MRI scan subsequently showed acute posterior left MCA perirolandic infarct with some petechial hemorrhage.  2D echo showed ejection fraction 60 to 65% without cardiac source of embolism.  LDL cholesterol 68 mg percent and hemoglobin A1c was 6.2.  Patient was on no antithrombotic therapy prior to admission and was switched to aspirin.  He showed improvement during hospitalization but was felt to be a good candidate for inpatient rehab and was transferred there.  Patient has been discharged home and is currently doing outpatient physical occupational therapy.  His speech has improved back to normal rate still has some residual  weakness in his right hand and diminished fine motor skills.  He states he is quit cocaine completely and has cut back smoking cigarettes to half pack per day.  He occasionally uses marijuana still.  He states his blood pressure is normally not high but today it is elevated in office at 179/94.  Plans to see his primary care physician and discuss medications for this soon.  He is tolerating Lipitor well without muscle aches and pain and aspirin without bruising or bleeding.  He has no complaints today.  ROS:   14 system review of systems is positive for dysarthria, facial weakness, and weakness all other systems negative  PMH:  Past Medical History:  Diagnosis Date   PUD (peptic ulcer disease) 2008   treated with meds    Social History:  Social History   Socioeconomic History   Marital status: Divorced    Spouse name: Not on file   Number of children: Not on file   Years of education: Not on file   Highest education level: Not on file  Occupational History   Not on file  Tobacco Use   Smoking status: Every Day    Packs/day: 0.50    Types: Cigarettes   Smokeless tobacco: Former  Building services engineer Use: Never used  Substance and Sexual Activity   Alcohol use: Yes    Comment: 2-3 "sparks" a day   Drug use: Yes    Types: "Crack" cocaine    Comment: "only every once in awhile"   Sexual activity: Not Currently  Other Topics Concern   Not on file  Social  History Narrative   Not on file   Social Determinants of Health   Financial Resource Strain: Not on file  Food Insecurity: Not on file  Transportation Needs: Not on file  Physical Activity: Not on file  Stress: Not on file  Social Connections: Not on file  Intimate Partner Violence: Not on file    Medications:   Current Outpatient Medications on File Prior to Visit  Medication Sig Dispense Refill   acetaminophen (TYLENOL) 325 MG tablet Take 1-2 tablets (325-650 mg total) by mouth every 4 (four) hours as needed for  mild pain.     aspirin 325 MG tablet Take 1 tablet (325 mg total) by mouth daily. 90 tablet 0   atorvastatin (LIPITOR) 20 MG tablet Take 1 tablet (20 mg total) by mouth daily. 90 tablet 1   omeprazole (PRILOSEC) 20 MG capsule Take 1 capsule (20 mg total) by mouth daily. 90 capsule 0   No current facility-administered medications on file prior to visit.    Allergies:  No Known Allergies  Physical Exam General: well developed, well nourished, seated, in no evident distress Head: head normocephalic and atraumatic.  Neck: supple with no carotid or supraclavicular bruits Cardiovascular: regular rate and rhythm, no murmurs Musculoskeletal: no deformity Skin:  no rash/petichiae Vascular:  Normal pulses all extremities Vitals:   11/30/21 0931  BP: (!) 179/94  Pulse: (!) 102   Neurologic Exam Mental Status: Awake and fully alert. Oriented to place and time. Recent and remote memory intact. Attention span, concentration and fund of knowledge appropriate. Mood and affect appropriate.  Mild dysarthria.  No aphasia. Cranial Nerves: Fundoscopic exam reveals sharp disc margins. Pupils equal, briskly reactive to light. Extraocular movements full without nystagmus. Visual fields full to confrontation. Hearing intact. Facial sensation intact.  Right lower facial weakness.  Tongue, palate moves normally and symmetrically.  Motor: Normal bulk and tone. Normal strength in all tested extremity muscles except weakness of right grip and intrinsic hand muscles.  Orbits left over right upper extremity.. Sensory.: intact to touch ,pinprick .position and vibratory sensation.  Coordination: Rapid alternating movements normal in all extremities. Finger-to-nose and heel-to-shin performed accurately bilaterally. Gait and Station: Arises from chair without difficulty. Stance is normal. Gait demonstrates normal stride length and balance . Able to heel, toe and tandem walk without difficulty.  Reflexes: 1+ and symmetric.  Toes downgoing.   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  Multiple vascular risk factors of intracranial atherosclerosis, hyperlipidemia, hypertension, cocaine abuse, cigarette smoking and marijuana abuse.     PLAN: I had a long d/w patient about his recent stroke, risk for recurrent stroke/TIAs, personally independently reviewed imaging studies and stroke evaluation results and answered questions.Continue aspirin 325 mg daily  for secondary stroke prevention and maintain strict control of hypertension with blood pressure goal below 130/90, diabetes with hemoglobin A1c goal below 6.5% and lipids with LDL cholesterol goal below 70 mg/dL. I also advised the patient to eat a healthy diet with plenty of whole grains, cereals, fruits and vegetables, exercise regularly and maintain ideal body weight I also counseled the patient to quit cocaine as well as smoking cigarettes and using marijuana and he voiced understanding.  Continue ongoing physical and occupational therapy.  Followup in the future with me in 6 months or call earlier if needed.Greater than 50% of time during this 35 minute visit was spent  on counseling,explanation of diagnosis of stroke, planning of further management, discussion with patient and family and coordination of care Delia Heady, MD Note: This document was prepared with digital dictation and possible smart phrase technology. Any transcriptional errors that result from this process are unintentional

## 2021-11-30 NOTE — Telephone Encounter (Signed)
Pt's sister, Dorene Sorrow (on [place]) said, concerning the visit this morning. Why was my brother told has 2 veins back of head, right side. Why did not suggest what can be done to unblock them? Would like a call from the nurse.

## 2021-11-30 NOTE — Patient Instructions (Signed)
I had a long d/w patient about his recent stroke, risk for recurrent stroke/TIAs, personally independently reviewed imaging studies and stroke evaluation results and answered questions.Continue aspirin 325 mg daily  for secondary stroke prevention and maintain strict control of hypertension with blood pressure goal below 130/90, diabetes with hemoglobin A1c goal below 6.5% and lipids with LDL cholesterol goal below 70 mg/dL. I also advised the patient to eat a healthy diet with plenty of whole grains, cereals, fruits and vegetables, exercise regularly and maintain ideal body weight I also counseled the patient to quit cocaine as well as smoking cigarettes and using marijuana and he voiced understanding.  Followup in the future with me in 6 months or call earlier if needed. Stroke Prevention Some medical conditions and behaviors can lead to a higher chance of having a stroke. You can help prevent a stroke by eating healthy, exercising, not smoking, and managing any medical conditions you have. Stroke is a leading cause of functional impairment. Primary prevention is particularly important because a majority of strokes are first-time events. Stroke changes the lives of not only those who experience a stroke but also their family and other caregivers. How can this condition affect me? A stroke is a medical emergency and should be treated right away. A stroke can lead to brain damage and can sometimes be life-threatening. If a person gets medical treatment right away, there is a better chance of surviving and recovering from a stroke. What can increase my risk? The following medical conditions may increase your risk of a stroke: Cardiovascular disease. High blood pressure (hypertension). Diabetes. High cholesterol. Sickle cell disease. Blood clotting disorders (hypercoagulable state). Obesity. Sleep disorders (obstructive sleep apnea). Other risk factors include: Being older than age 55. Having a  history of blood clots, stroke, or mini-stroke (transient ischemic attack, TIA). Genetic factors, such as race, ethnicity, or a family history of stroke. Smoking cigarettes or using other tobacco products. Taking birth control pills, especially if you also use tobacco. Heavy use of alcohol or drugs, especially cocaine and methamphetamine. Physical inactivity. What actions can I take to prevent this? Manage your health conditions High cholesterol levels. Eating a healthy diet is important for preventing high cholesterol. If cholesterol cannot be managed through diet alone, you may need to take medicines. Take any prescribed medicines to control your cholesterol as told by your health care provider. Hypertension. To reduce your risk of stroke, try to keep your blood pressure below 130/80. Eating a healthy diet and exercising regularly are important for controlling blood pressure. If these steps are not enough to manage your blood pressure, you may need to take medicines. Take any prescribed medicines to control hypertension as told by your health care provider. Ask your health care provider if you should monitor your blood pressure at home. Have your blood pressure checked every year, even if your blood pressure is normal. Blood pressure increases with age and some medical conditions. Diabetes. Eating a healthy diet and exercising regularly are important parts of managing your blood sugar (glucose). If your blood sugar cannot be managed through diet and exercise, you may need to take medicines. Take any prescribed medicines to control your diabetes as told by your health care provider. Get evaluated for obstructive sleep apnea. Talk to your health care provider about getting a sleep evaluation if you snore a lot or have excessive sleepiness. Make sure that any other medical conditions you have, such as atrial fibrillation or atherosclerosis, are managed. Nutrition Follow instructions from your  health care provider about what to eat or drink to help manage your health condition. These instructions may include: Reducing your daily calorie intake. Limiting how much salt (sodium) you use to 1,500 milligrams (mg) each day. Using only healthy fats for cooking, such as olive oil, canola oil, or sunflower oil. Eating healthy foods. You can do this by: Choosing foods that are high in fiber, such as whole grains, and fresh fruits and vegetables. Eating at least 5 servings of fruits and vegetables a day. Try to fill one-half of your plate with fruits and vegetables at each meal. Choosing lean protein foods, such as lean cuts of meat, poultry without skin, fish, tofu, beans, and nuts. Eating low-fat dairy products. Avoiding foods that are high in sodium. This can help lower blood pressure. Avoiding foods that have saturated fat, trans fat, and cholesterol. This can help prevent high cholesterol. Avoiding processed and prepared foods. Counting your daily carbohydrate intake.  Lifestyle If you drink alcohol: Limit how much you have to: 0-1 drink a day for women who are not pregnant. 0-2 drinks a day for men. Know how much alcohol is in your drink. In the [place] one drink equals one 12 oz bottle of beer (370m), one 5 oz glass of wine (1436m, or one 1 oz glass of hard liquor (4463m Do not use any products that contain nicotine or tobacco. These products include cigarettes, chewing tobacco, and vaping devices, such as e-cigarettes. If you need help quitting, ask your health care provider. Avoid secondhand smoke. Do not use drugs. Activity  Try to stay at a healthy weight. Get at least 30 minutes of exercise on most days, such as: Fast walking. Biking. Swimming. Medicines Take over-the-counter and prescription medicines only as told by your health care provider. Aspirin or blood thinners (antiplatelets or anticoagulants) may be recommended to reduce your risk of forming blood clots that  can lead to stroke. Avoid taking birth control pills. Talk to your health care provider about the risks of taking birth control pills if: You are over 35 70ars old. You smoke. You get very bad headaches. You have had a blood clot. Where to find more information American Stroke Association: www.strokeassociation.org Get help right away if: You or a loved one has any symptoms of a stroke. "BE FAST" is an easy way to remember the main warning signs of a stroke: B - Balance. Signs are dizziness, sudden trouble walking, or loss of balance. E - Eyes. Signs are trouble seeing or a sudden change in vision. F - Face. Signs are sudden weakness or numbness of the face, or the face or eyelid drooping on one side. A - Arms. Signs are weakness or numbness in an arm. This happens suddenly and usually on one side of the body. S - Speech. Signs are sudden trouble speaking, slurred speech, or trouble understanding what people say. T - Time. Time to call emergency services. Write down what time symptoms started. You or a loved one has other signs of a stroke, such as: A sudden, severe headache with no known cause. Nausea or vomiting. Seizure. These symptoms may represent a serious problem that is an emergency. Do not wait to see if the symptoms will go away. Get medical help right away. Call your local emergency services (911 in the [place] Do not drive yourself to the hospital. Summary You can help to prevent a stroke by eating healthy, exercising, not smoking, limiting alcohol intake, and managing any medical conditions you may have.  Do not use any products that contain nicotine or tobacco. These include cigarettes, chewing tobacco, and vaping devices, such as e-cigarettes. If you need help quitting, ask your health care provider. Remember "BE FAST" for warning signs of a stroke. Get help right away if you or a loved one has any of these signs. This information is not intended to replace advice given to you  by your health care provider. Make sure you discuss any questions you have with your health care provider. Document Revised: 11/24/2019 Document Reviewed: 11/24/2019 Elsevier Patient Education  [employer].

## 2021-11-30 NOTE — Telephone Encounter (Signed)
   Chief Complaint: BP in neurology office today 179/94. Sister declines OV. Asking if Lisinopril can be started again. Has appointment 8/723. Symptoms: No symptoms Frequency: Today Pertinent Negatives: Patient denies any symptoms Disposition: [] ED /[] Urgent Care (no appt availability in office) / [] Appointment(In office/virtual)/ []  [facility]/ [] Home Care/ [] Refused Recommended Disposition /[] [facility]/ [x]  Follow-up with PCP Additional Notes: Please advise   Answer Assessment - Initial Assessment Questions 1. BLOOD PRESSURE: "What is the blood pressure?" "Did you take at least two measurements 5 minutes apart?"     179/94   2. ONSET: "When did you take your blood pressure?"     Today in neurology office 3. HOW: "How did you take your blood pressure?" (e.g., automatic home BP monitor, visiting nurse)     Dr.'s office 4. HISTORY: "Do you have a history of high blood pressure?"     Yes 5. MEDICINES: "Are you taking any medicines for blood pressure?" "Have you missed any doses recently?"     No 6. OTHER SYMPTOMS: "Do you have any symptoms?" (e.g., blurred vision, chest pain, difficulty breathing, headache, weakness)     No 7. PREGNANCY: "Is there any chance you are pregnant?" "When was your last menstrual period?"     N/a  Protocols used: Blood Pressure - High-A-AH

## 2021-12-01 NOTE — Telephone Encounter (Signed)
Please advise patient.  

## 2021-12-02 NOTE — Telephone Encounter (Signed)
Spoke w/ patient and sister. Patient understand pcp would like to see patient before starting /changing medication

## 2021-12-06 ENCOUNTER — Ambulatory Visit: Payer: Medicaid Other | Attending: Physician Assistant | Admitting: Occupational Therapy

## 2021-12-06 ENCOUNTER — Encounter: Payer: Self-pay | Admitting: Occupational Therapy

## 2021-12-06 DIAGNOSIS — M6281 Muscle weakness (generalized): Secondary | ICD-10-CM | POA: Diagnosis present

## 2021-12-06 DIAGNOSIS — R278 Other lack of coordination: Secondary | ICD-10-CM | POA: Insufficient documentation

## 2021-12-06 DIAGNOSIS — I69353 Hemiplegia and hemiparesis following cerebral infarction affecting right non-dominant side: Secondary | ICD-10-CM | POA: Diagnosis present

## 2021-12-06 DIAGNOSIS — M25511 Pain in right shoulder: Secondary | ICD-10-CM | POA: Diagnosis present

## 2021-12-06 NOTE — Therapy (Signed)
OUTPATIENT OCCUPATIONAL THERAPY NEURO TREATMENT  Patient Name: Christopher [name] MRN: [id number] DOB:22-Dec-1965, 56 y.o., male Today's Date: 12/06/2021  PCP: Dorna Mai, MD REFERRING PROVIDER: Cathlyn Parsons, PA-C    OT End of Session - 12/06/21 0933     Visit Number 13    Number of Visits 24    Date for OT Re-Evaluation 01/10/22    Authorization Type MCD potential - currently self pay    OT Start Time 0930    OT Stop Time 1015    OT Time Calculation (min) 45 min    Activity Tolerance Patient tolerated treatment well    Behavior During Therapy [facility] for tasks assessed/performed               Past Medical History:  Diagnosis Date   PUD (peptic ulcer disease) 2008   treated with meds   History reviewed. No pertinent surgical history. Patient Active Problem List   Diagnosis Date Noted   Acute ischemic left MCA stroke ([place]) 09/14/2021   Pressure injury of skin 09/11/2021   Stroke ([place]) 09/10/2021   Gastroesophageal reflux disease without esophagitis 08/24/2021   History of CVA (cerebrovascular accident) 08/11/2021   Dyslipidemia 01/24/2021   Essential hypertension    Hyponatremia    Polysubstance abuse ([place])    Cerebral thrombosis with cerebral infarction 01/11/2021   Acute CVA (cerebrovascular accident) ([place]) 01/11/2021   Right sided weakness 01/10/2021   AKI (acute kidney injury) ([place]) 01/10/2021   Tobacco abuse 01/10/2021   Cocaine abuse ([place]) 01/10/2021   Leukocytosis 01/10/2021   Cellulitis and abscess of upper arm and forearm 11/13/2012   Episodic tobacco abuse 11/13/2012    ONSET DATE: 09/10/21  REFERRING DIAG: N17.001 (ICD-10-CM) - Cerebral infarction due to unspecified occlusion or stenosis of left middle cerebral artery   THERAPY DIAG:  Hemiplegia and hemiparesis following cerebral infarction affecting right non-dominant side (HCC)  Other lack of coordination  Muscle weakness (generalized)  Rationale for Evaluation and Treatment  Rehabilitation  SUBJECTIVE:   SUBJECTIVE STATEMENT: I am getting a little feeling back in my hand. My BP has been running a little high Pt accompanied by: self  PERTINENT HISTORY: 56 y.o. male presents to [facility] hospital on 09/10/2021 with acute R weakenss and slurred speech. CT head negative. Pt received TNK. MRI demonstrates posterior L MCA infarct. PMH includes HTN, HLD, CVA, PUD, hx of cocaine abuse.  PRECAUTIONS: Other: no driving  WEIGHT BEARING RESTRICTIONS No  PAIN:  Are you having pain? Yes: NPRS scale: 0/10 Pain location: R shoulder Pain description: dull Aggravating factors: lying on it and compensation Relieving factors: tylenol, stretching, proper positioning       OBJECTIVE:   HAND DOMINANCE: Left   TODAY'S TREATMENT:   12/06/21   Placing white pegs in semicircular pegboard Rt hand w/ mod difficulty and drops. Then progressed to turning pegs around and placing in pegboard on other end of peg for emerging in hand manipulation w/ assist from Lt hand and max drops, lack of manipulation Rt hand. Placed red pegs in, but did not turn around.   Pt issued some activities to perform Rt hand w/ Lt hand constrained (in oven mitt) - ONLY to perform when seated (except for certain tasks that require standing) - reviewed benefit of modified constraint induced therapy. Pt cautioned however to continue to use LUE for anything: hot, breakable, heavy, or sharp  UBE x 8 min, level 3 for normal reciprocal movement pattern and UB conditioning (4 min forward, 4 min  backward)       HOME EXERCISE PROGRAM: 10/11/21: initial HEP for RUE shoulder, hand, and simple coordination tasks 10/13/21: Recommendations on safety d/t lack of sensation Rt hand, and A/E recommendations 11/24/21: bed positioning handout  GOALS: Goals reviewed with patient? Yes  SHORT TERM GOALS: Target date: 10/28/21   Pt will be independent with HEP targeting RUE shoulder and Rt hand coordination  Baseline: not yet  addressed Goal status: ACHIEVED  2.  Pt will improve RUE function as evidenced by performing Box & Blocks to 15 blocks Baseline: 5 blocks Goal status: ACHIEVED 16 blocks 10/27/21  3.  Pt will report pain Rt shoulder less than or equal to 3/10 with high level reaching Baseline: 5-6/10 Goal status: ACHIEVED reports 1-2/10 pain with flexion  4.  Pt will improve Rt hand grip strength to 24 lbs or greater Baseline: 14.3 lbs Goal status: ACHIEVED  18.7 lbs 10/27/21, 26 LBS 11/10/21  5.  Pt to verbalize understanding with safety and compensatory strategies for lack of sensation Rt hand Baseline: not yet addressed Goal status: ACHIEVED  6.  Pt to verbalize understanding for task modifications and A/E needs to increase independence with ADLS (rocker knife, shoe buttons, etc)  Baseline: not yet addressed Goal status: DEFERRED, no longer needed   LONG TERM GOALS: Target date: 11/26/21  Independent with updated HEP PRN Baseline: Not yet addressed Goal status: IN PROGRESS  2.  Pt to improve Rt hand function as evidenced by performing 9 hole peg test in under 2 minutes Baseline: unable at evaluation Goal status: IN PROGRESS (11/21/21: placed 2 pegs in 2 min)   3.  Pt to return to performing snack/sandwich/microwaveable items at mod I level Baseline: dependent Goal status: MET  4.  Pt to return to light IADLS (making bed, folding laundry, etc) safely Baseline: dependent Goal status: IN PROGRESS (demo folding towels in clinic)   5.  Pt to improve RUE function as evidenced by performing 25 blocks or Box & Blocks test Baseline: 5 blocks Goal status: IN PROGRESS (11/21/21: 17 blocks)   6.  Pt to use Rt hand as assist for bilateral tasks 50% of time Baseline: unable Goal status: IN PROGRESS  ASSESSMENT:  CLINICAL IMPRESSION: Pt continues to demonstrate compliance with HEP and motivation towards returning to PLOF. Increased coordination and reports increased sensation in RUE, however still  limited. Pt still lacks proximal strength and distal control and would benefit from renewal. Pt has met all STG's and 1 LTG. Pt progressing towards remaining LTG's and would benefit from continued skilled O.T. to address remaining goals and maximize rehab potential re: RUE functional use, coordination, and strength.   PERFORMANCE DEFICITS in functional skills including ADLs, IADLs, coordination, dexterity, proprioception, sensation, edema, tone, ROM, strength, pain, FMC, body mechanics, decreased knowledge of use of DME, and UE functional use,   IMPAIRMENTS are limiting patient from ADLs and IADLs.   COMORBIDITIES may have co-morbidities  that affects occupational performance. Patient will benefit from skilled OT to address above impairments and improve overall function.  MODIFICATION OR ASSISTANCE TO COMPLETE EVALUATION: No modification of tasks or assist necessary to complete an evaluation.  OT OCCUPATIONAL PROFILE AND HISTORY: Problem focused assessment: Including review of records relating to presenting problem.  CLINICAL DECISION MAKING: Moderate - several treatment options, min-mod task modification necessary  REHAB POTENTIAL: Good  EVALUATION COMPLEXITY: Low    PLAN: OT FREQUENCY: 2x/week  OT DURATION: additional 6 weeks   PLANNED INTERVENTIONS: self care/ADL training, therapeutic exercise, therapeutic  activity, neuromuscular re-education, manual therapy, passive range of motion, functional mobility training, splinting, electrical stimulation, moist heat, cryotherapy, patient/family education, energy conservation, coping strategies training, and DME and/or AE instructions  RECOMMENDED OTHER SERVICES: none  CONSULTED AND AGREED WITH PLAN OF CARE: Patient  PLAN FOR NEXT SESSION:   Continue working on RUE functional use, strength, and coordination, proximal strengthening   Hans Eden, OT 12/06/2021, 9:34 AM

## 2021-12-07 ENCOUNTER — Ambulatory Visit: Payer: Self-pay | Admitting: Family Medicine

## 2021-12-08 ENCOUNTER — Ambulatory Visit: Payer: Medicaid Other | Admitting: Occupational Therapy

## 2021-12-08 DIAGNOSIS — I69353 Hemiplegia and hemiparesis following cerebral infarction affecting right non-dominant side: Secondary | ICD-10-CM | POA: Diagnosis not present

## 2021-12-08 DIAGNOSIS — R278 Other lack of coordination: Secondary | ICD-10-CM

## 2021-12-08 DIAGNOSIS — M6281 Muscle weakness (generalized): Secondary | ICD-10-CM

## 2021-12-08 NOTE — Therapy (Signed)
OUTPATIENT OCCUPATIONAL THERAPY NEURO TREATMENT  Patient Name: Christopher [name] MRN: [id number] DOB:1966/04/26, 56 y.o., male Today's Date: 12/08/2021  PCP: Dorna Mai, MD REFERRING PROVIDER: Cathlyn Parsons, PA-C    OT End of Session - 12/08/21 0935     Visit Number 14    Number of Visits 24    Date for OT Re-Evaluation 01/10/22    Authorization Type MCD potential - currently self pay    OT Start Time 0930    OT Stop Time 1015    OT Time Calculation (min) 45 min    Activity Tolerance Patient tolerated treatment well    Behavior During Therapy [facility] for tasks assessed/performed               Past Medical History:  Diagnosis Date   PUD (peptic ulcer disease) 2008   treated with meds   No past surgical history on file. Patient Active Problem List   Diagnosis Date Noted   Acute ischemic left MCA stroke ([place]) 09/14/2021   Pressure injury of skin 09/11/2021   Stroke ([place]) 09/10/2021   Gastroesophageal reflux disease without esophagitis 08/24/2021   History of CVA (cerebrovascular accident) 08/11/2021   Dyslipidemia 01/24/2021   Essential hypertension    Hyponatremia    Polysubstance abuse ([place])    Cerebral thrombosis with cerebral infarction 01/11/2021   Acute CVA (cerebrovascular accident) ([place]) 01/11/2021   Right sided weakness 01/10/2021   AKI (acute kidney injury) ([place]) 01/10/2021   Tobacco abuse 01/10/2021   Cocaine abuse ([place]) 01/10/2021   Leukocytosis 01/10/2021   Cellulitis and abscess of upper arm and forearm 11/13/2012   Episodic tobacco abuse 11/13/2012    ONSET DATE: 09/10/21  REFERRING DIAG: B63.893 (ICD-10-CM) - Cerebral infarction due to unspecified occlusion or stenosis of left middle cerebral artery   THERAPY DIAG:  Hemiplegia and hemiparesis following cerebral infarction affecting right non-dominant side (HCC)  Other lack of coordination  Muscle weakness (generalized)  Rationale for Evaluation and Treatment  Rehabilitation  SUBJECTIVE:   SUBJECTIVE STATEMENT: My Rt knee hurts more today Pt accompanied by: self  PERTINENT HISTORY: 56 y.o. male presents to [facility] hospital on 09/10/2021 with acute R weakenss and slurred speech. CT head negative. Pt received TNK. MRI demonstrates posterior L MCA infarct. PMH includes HTN, HLD, CVA, PUD, hx of cocaine abuse.  PRECAUTIONS: Other: no driving  WEIGHT BEARING RESTRICTIONS No  PAIN:  Are you having pain? Yes: NPRS scale: 0/10 Pain location: R shoulder Pain description: dull Aggravating factors: lying on it and compensation Relieving factors: tylenol, stretching, proper positioning     Rt knee pain 3/10 - O.T. not directly addressing  OBJECTIVE:   HAND DOMINANCE: Left   TODAY'S TREATMENT:   12/08/21   Gripper set at level 1 resistance to pick up blocks Rt hand for sustained grip strength, control and coordination  Placing large pegs in pegboard Rt hand w/ mod difficulty and drops, extra time required  Worked on Rt thumb opposition and palmer abduction - pt has very little true palmer abduction  Standing at wall: worked on scapula stabilization/NMR for proximal strengthening including: maintaining ball at 90* against wall w/ small circumduction ex's, BUE sh flexion rolling ball up wall, and scapula retraction/downward depression lifting RUE off wall from high level sh flexion  UBE x 5 min, level 3 for normal reciprocal movement pattern and UB conditioning      HOME EXERCISE PROGRAM: 10/11/21: initial HEP for RUE shoulder, hand, and simple coordination tasks 10/13/21: Recommendations on safety  d/t lack of sensation Rt hand, and A/E recommendations 11/24/21: bed positioning handout  GOALS: Goals reviewed with patient? Yes  SHORT TERM GOALS: Target date: 10/28/21   Pt will be independent with HEP targeting RUE shoulder and Rt hand coordination  Baseline: not yet addressed Goal status: ACHIEVED  2.  Pt will improve RUE function as  evidenced by performing Box & Blocks to 15 blocks Baseline: 5 blocks Goal status: ACHIEVED 16 blocks 10/27/21  3.  Pt will report pain Rt shoulder less than or equal to 3/10 with high level reaching Baseline: 5-6/10 Goal status: ACHIEVED reports 1-2/10 pain with flexion  4.  Pt will improve Rt hand grip strength to 24 lbs or greater Baseline: 14.3 lbs Goal status: ACHIEVED  18.7 lbs 10/27/21, 26 LBS 11/10/21  5.  Pt to verbalize understanding with safety and compensatory strategies for lack of sensation Rt hand Baseline: not yet addressed Goal status: ACHIEVED  6.  Pt to verbalize understanding for task modifications and A/E needs to increase independence with ADLS (rocker knife, shoe buttons, etc)  Baseline: not yet addressed Goal status: DEFERRED, no longer needed   LONG TERM GOALS: Target date: 11/26/21  Independent with updated HEP PRN Baseline: Not yet addressed Goal status: IN PROGRESS  2.  Pt to improve Rt hand function as evidenced by performing 9 hole peg test in under 2 minutes Baseline: unable at evaluation Goal status: IN PROGRESS (11/21/21: placed 2 pegs in 2 min)   3.  Pt to return to performing snack/sandwich/microwaveable items at mod I level Baseline: dependent Goal status: MET  4.  Pt to return to light IADLS (making bed, folding laundry, etc) safely Baseline: dependent Goal status: IN PROGRESS (demo folding towels in clinic)   5.  Pt to improve RUE function as evidenced by performing 25 blocks or Box & Blocks test Baseline: 5 blocks Goal status: IN PROGRESS (11/21/21: 17 blocks)   6.  Pt to use Rt hand as assist for bilateral tasks 50% of time Baseline: unable Goal status: IN PROGRESS  ASSESSMENT:  CLINICAL IMPRESSION: Pt w/ limited functional movement Rt thumb which hinders coordination and functional tasks. Pt w/ increased proximal stability Rt sh girdle  PERFORMANCE DEFICITS in functional skills including ADLs, IADLs, coordination, dexterity,  proprioception, sensation, edema, tone, ROM, strength, pain, FMC, body mechanics, decreased knowledge of use of DME, and UE functional use,   IMPAIRMENTS are limiting patient from ADLs and IADLs.   COMORBIDITIES may have co-morbidities  that affects occupational performance. Patient will benefit from skilled OT to address above impairments and improve overall function.  MODIFICATION OR ASSISTANCE TO COMPLETE EVALUATION: No modification of tasks or assist necessary to complete an evaluation.  OT OCCUPATIONAL PROFILE AND HISTORY: Problem focused assessment: Including review of records relating to presenting problem.  CLINICAL DECISION MAKING: Moderate - several treatment options, min-mod task modification necessary  REHAB POTENTIAL: Good  EVALUATION COMPLEXITY: Low    PLAN: OT FREQUENCY: 2x/week  OT DURATION: additional 6 weeks   PLANNED INTERVENTIONS: self care/ADL training, therapeutic exercise, therapeutic activity, neuromuscular re-education, manual therapy, passive range of motion, functional mobility training, splinting, electrical stimulation, moist heat, cryotherapy, patient/family education, energy conservation, coping strategies training, and DME and/or AE instructions  RECOMMENDED OTHER SERVICES: none  CONSULTED AND AGREED WITH PLAN OF CARE: Patient  PLAN FOR NEXT SESSION:   Continue working on RUE functional use, strength, and coordination, proximal strengthening   Hans Eden, OT 12/08/2021, 9:36 AM

## 2021-12-12 ENCOUNTER — Ambulatory Visit: Payer: Medicaid Other | Admitting: Occupational Therapy

## 2021-12-12 ENCOUNTER — Ambulatory Visit: Payer: Self-pay | Admitting: Family Medicine

## 2021-12-12 DIAGNOSIS — M6281 Muscle weakness (generalized): Secondary | ICD-10-CM

## 2021-12-12 DIAGNOSIS — I69353 Hemiplegia and hemiparesis following cerebral infarction affecting right non-dominant side: Secondary | ICD-10-CM

## 2021-12-12 DIAGNOSIS — R278 Other lack of coordination: Secondary | ICD-10-CM

## 2021-12-12 NOTE — Therapy (Signed)
OUTPATIENT OCCUPATIONAL THERAPY NEURO TREATMENT  Patient Name: Christopher [name] MRN: [id number] DOB:1965-10-10, 56 y.o., male Today's Date: 12/12/2021  PCP: Dorna Mai, MD REFERRING PROVIDER: Cathlyn Parsons, PA-C    OT End of Session - 12/12/21 1105     Visit Number 15    Number of Visits 24    Date for OT Re-Evaluation 01/10/22    Authorization Type MCD potential - currently self pay    OT Start Time 1103    OT Stop Time 1145    OT Time Calculation (min) 42 min    Activity Tolerance Patient tolerated treatment well    Behavior During Therapy [facility] for tasks assessed/performed               Past Medical History:  Diagnosis Date   PUD (peptic ulcer disease) 2008   treated with meds   No past surgical history on file. Patient Active Problem List   Diagnosis Date Noted   Acute ischemic left MCA stroke ([place]) 09/14/2021   Pressure injury of skin 09/11/2021   Stroke ([place]) 09/10/2021   Gastroesophageal reflux disease without esophagitis 08/24/2021   History of CVA (cerebrovascular accident) 08/11/2021   Dyslipidemia 01/24/2021   Essential hypertension    Hyponatremia    Polysubstance abuse ([place])    Cerebral thrombosis with cerebral infarction 01/11/2021   Acute CVA (cerebrovascular accident) ([place]) 01/11/2021   Right sided weakness 01/10/2021   AKI (acute kidney injury) ([place]) 01/10/2021   Tobacco abuse 01/10/2021   Cocaine abuse ([place]) 01/10/2021   Leukocytosis 01/10/2021   Cellulitis and abscess of upper arm and forearm 11/13/2012   Episodic tobacco abuse 11/13/2012    ONSET DATE: 09/10/21  REFERRING DIAG: H29.924 (ICD-10-CM) - Cerebral infarction due to unspecified occlusion or stenosis of left middle cerebral artery   THERAPY DIAG:  Hemiplegia and hemiparesis following cerebral infarction affecting right non-dominant side (HCC)  Other lack of coordination  Muscle weakness (generalized)  Rationale for Evaluation and Treatment  Rehabilitation  SUBJECTIVE:   SUBJECTIVE STATEMENT: No pain today Pt accompanied by: self  PERTINENT HISTORY: 56 y.o. male presents to [facility] hospital on 09/10/2021 with acute R weakenss and slurred speech. CT head negative. Pt received TNK. MRI demonstrates posterior L MCA infarct. PMH includes HTN, HLD, CVA, PUD, hx of cocaine abuse.  PRECAUTIONS: Other: no driving  WEIGHT BEARING RESTRICTIONS No  PAIN:  Are you having pain? No    OBJECTIVE:   HAND DOMINANCE: Left   TODAY'S TREATMENT:   12/12/21   Worked on Rt thumb opposition and palmer abduction - pt has very little true palmer abduction  Made thumb strap to place thumb in slightly more palmer abduction for increased functional grasping and pinching Practiced stacking blocks for proper pincer grasp w/ thumb strap on (stacks of 3 blocks), min drops and difficulty, then unstacking one at a time for control/coordination (strap still on)   UBE x 6 min, level 3 for normal reciprocal movement pattern and UB conditioning      HOME EXERCISE PROGRAM: 10/11/21: initial HEP for RUE shoulder, hand, and simple coordination tasks 10/13/21: Recommendations on safety d/t lack of sensation Rt hand, and A/E recommendations 11/24/21: bed positioning handout  GOALS: Goals reviewed with patient? Yes  SHORT TERM GOALS: Target date: 10/28/21   Pt will be independent with HEP targeting RUE shoulder and Rt hand coordination  Baseline: not yet addressed Goal status: ACHIEVED  2.  Pt will improve RUE function as evidenced by performing Box & Blocks to  15 blocks Baseline: 5 blocks Goal status: ACHIEVED 16 blocks 10/27/21  3.  Pt will report pain Rt shoulder less than or equal to 3/10 with high level reaching Baseline: 5-6/10 Goal status: ACHIEVED reports 1-2/10 pain with flexion  4.  Pt will improve Rt hand grip strength to 24 lbs or greater Baseline: 14.3 lbs Goal status: ACHIEVED  18.7 lbs 10/27/21, 26 LBS 11/10/21  5.  Pt to verbalize  understanding with safety and compensatory strategies for lack of sensation Rt hand Baseline: not yet addressed Goal status: ACHIEVED  6.  Pt to verbalize understanding for task modifications and A/E needs to increase independence with ADLS (rocker knife, shoe buttons, etc)  Baseline: not yet addressed Goal status: DEFERRED, no longer needed   LONG TERM GOALS: Target date: 11/26/21  Independent with updated HEP PRN Baseline: Not yet addressed Goal status: IN PROGRESS  2.  Pt to improve Rt hand function as evidenced by performing 9 hole peg test in under 2 minutes Baseline: unable at evaluation Goal status: IN PROGRESS (11/21/21: placed 2 pegs in 2 min)   3.  Pt to return to performing snack/sandwich/microwaveable items at mod I level Baseline: dependent Goal status: MET  4.  Pt to return to light IADLS (making bed, folding laundry, etc) safely Baseline: dependent Goal status: IN PROGRESS (demo folding towels in clinic)   5.  Pt to improve RUE function as evidenced by performing 25 blocks or Box & Blocks test Baseline: 5 blocks Goal status: IN PROGRESS (11/21/21: 17 blocks)   6.  Pt to use Rt hand as assist for bilateral tasks 50% of time Baseline: unable Goal status: IN PROGRESS  ASSESSMENT:  CLINICAL IMPRESSION: Pt w/ limited functional movement Rt thumb which hinders coordination and functional tasks. Pt w/ increased proximal stability Rt sh girdle  PERFORMANCE DEFICITS in functional skills including ADLs, IADLs, coordination, dexterity, proprioception, sensation, edema, tone, ROM, strength, pain, FMC, body mechanics, decreased knowledge of use of DME, and UE functional use,   IMPAIRMENTS are limiting patient from ADLs and IADLs.   COMORBIDITIES may have co-morbidities  that affects occupational performance. Patient will benefit from skilled OT to address above impairments and improve overall function.  MODIFICATION OR ASSISTANCE TO COMPLETE EVALUATION: No modification of  tasks or assist necessary to complete an evaluation.  OT OCCUPATIONAL PROFILE AND HISTORY: Problem focused assessment: Including review of records relating to presenting problem.  CLINICAL DECISION MAKING: Moderate - several treatment options, min-mod task modification necessary  REHAB POTENTIAL: Good  EVALUATION COMPLEXITY: Low    PLAN: OT FREQUENCY: 2x/week  OT DURATION: additional 6 weeks   PLANNED INTERVENTIONS: self care/ADL training, therapeutic exercise, therapeutic activity, neuromuscular re-education, manual therapy, passive range of motion, functional mobility training, splinting, electrical stimulation, moist heat, cryotherapy, patient/family education, energy conservation, coping strategies training, and DME and/or AE instructions  RECOMMENDED OTHER SERVICES: none  CONSULTED AND AGREED WITH PLAN OF CARE: Patient  PLAN FOR NEXT SESSION:   Continue working on RUE functional use, strength, and coordination, proximal strengthening. Pt to bring in thumb strap next session    Hans Eden, OT 12/12/2021, 11:05 AM

## 2021-12-13 ENCOUNTER — Ambulatory Visit (INDEPENDENT_AMBULATORY_CARE_PROVIDER_SITE_OTHER): Payer: Medicaid Other | Admitting: Family Medicine

## 2021-12-13 ENCOUNTER — Encounter: Payer: Self-pay | Admitting: Family Medicine

## 2021-12-13 VITALS — BP 158/79 | HR 83 | Temp 97.7°F | Resp 16 | Ht 69.0 in | Wt 144.4 lb

## 2021-12-13 DIAGNOSIS — I10 Essential (primary) hypertension: Secondary | ICD-10-CM

## 2021-12-13 DIAGNOSIS — Z8673 Personal history of transient ischemic attack (TIA), and cerebral infarction without residual deficits: Secondary | ICD-10-CM | POA: Diagnosis not present

## 2021-12-13 MED ORDER — LISINOPRIL 20 MG PO TABS: 20.0000 mg | ORAL_TABLET | Freq: Every day | ORAL | 0 refills | Status: DC

## 2021-12-13 NOTE — Progress Notes (Signed)
Patient is here for f/u CVA. Patient has not taken in BP medication since he had stroke in May. Patient is struggling with keeping his blood pressure down.

## 2021-12-15 ENCOUNTER — Ambulatory Visit: Payer: Medicaid Other | Admitting: Occupational Therapy

## 2021-12-15 ENCOUNTER — Encounter: Payer: Self-pay | Admitting: Occupational Therapy

## 2021-12-15 ENCOUNTER — Encounter: Payer: Self-pay | Admitting: Family Medicine

## 2021-12-15 DIAGNOSIS — M6281 Muscle weakness (generalized): Secondary | ICD-10-CM

## 2021-12-15 DIAGNOSIS — I69353 Hemiplegia and hemiparesis following cerebral infarction affecting right non-dominant side: Secondary | ICD-10-CM

## 2021-12-15 DIAGNOSIS — R278 Other lack of coordination: Secondary | ICD-10-CM

## 2021-12-15 DIAGNOSIS — M25511 Pain in right shoulder: Secondary | ICD-10-CM

## 2021-12-15 NOTE — Progress Notes (Signed)
Established Patient Office Visit  Subjective    Patient ID: Christopher [name], male    DOB: 1966/04/08  Age: 56 y.o. MRN: [id number]  CC:  Chief Complaint  Patient presents with   Follow-up   Hypertension    HPI Christopher [name] presents for follow up of hypertension. He reports that he was taken off his blood pressure meds when he was in the hospital but that now he has elevated blood pressures and has been having intermittent headaches.    Outpatient Encounter Medications as of 12/13/2021  Medication Sig   acetaminophen (TYLENOL) 325 MG tablet Take 1-2 tablets (325-650 mg total) by mouth every 4 (four) hours as needed for mild pain.   aspirin 325 MG tablet Take 1 tablet (325 mg total) by mouth daily.   atorvastatin (LIPITOR) 20 MG tablet Take 1 tablet (20 mg total) by mouth daily.   lisinopril (ZESTRIL) 20 MG tablet Take 1 tablet (20 mg total) by mouth daily.   omeprazole (PRILOSEC) 20 MG capsule Take 1 capsule (20 mg total) by mouth daily.   No facility-administered encounter medications on file as of 12/13/2021.    Past Medical History:  Diagnosis Date   PUD (peptic ulcer disease) 2008   treated with meds    No past surgical history on file.  Family History  Problem Relation Age of Onset   Cancer Mother    Stroke Father     Social History   Socioeconomic History   Marital status: Divorced    Spouse name: Not on file   Number of children: Not on file   Years of education: Not on file   Highest education level: Not on file  Occupational History   Not on file  Tobacco Use   Smoking status: Every Day    Packs/day: 0.50    Types: Cigarettes   Smokeless tobacco: Former  Building services engineer Use: Never used  Substance and Sexual Activity   Alcohol use: Yes    Comment: 2-3 "sparks" a day   Drug use: Yes    Types: "Crack" cocaine    Comment: "only every once in awhile"   Sexual activity: Not Currently  Other Topics Concern   Not on file  Social History  Narrative   Not on file   Social Determinants of Health   Financial Resource Strain: Not on file  Food Insecurity: Not on file  Transportation Needs: Not on file  Physical Activity: Not on file  Stress: Not on file  Social Connections: Not on file  Intimate Partner Violence: Not on file    Review of Systems  Neurological:  Positive for headaches. Negative for weakness.  All other systems reviewed and are negative.       Objective    BP (!) 158/79 (BP Location: Right Arm, Patient Position: Sitting, Cuff Size: Normal)   Pulse 83   Temp 97.7 F (36.5 C) (Oral)   Resp 16   Ht 5\' 9"  (1.753 m)   Wt 144 lb 6.4 oz (65.5 kg)   SpO2 93%   BMI 21.32 kg/m   Physical Exam Vitals and nursing note reviewed.  Constitutional:      General: He is not in acute distress. Cardiovascular:     Rate and Rhythm: Normal rate and regular rhythm.  Pulmonary:     Effort: Pulmonary effort is normal.     Breath sounds: Normal breath sounds.  Abdominal:     Palpations: Abdomen is soft.  Tenderness: There is no abdominal tenderness.  Neurological:     General: No focal deficit present.     Mental Status: He is alert and oriented to person, place, and time.         Assessment & Plan:   1. Essential hypertension Elevated readings. Will restart lisinopril 20 mg daily and monitor  2. History of CVA (cerebrovascular accident) Management per consultant.   Return in about 6 weeks (around 01/24/2022) for follow up HTN.   [name], MD

## 2021-12-15 NOTE — Therapy (Signed)
OUTPATIENT OCCUPATIONAL THERAPY NEURO TREATMENT & Discharge Note  Patient Name: Christopher [name] MRN: [id number] DOB:07-28-1965, 56 y.o., male Today's Date: 12/15/2021  PCP: Dorna Mai, MD REFERRING PROVIDER: Cathlyn Parsons, PA-C    OT End of Session - 12/15/21 1023     Visit Number 16    Number of Visits 24    Date for OT Re-Evaluation 01/10/22    Authorization Type MCD potential - currently self pay    OT Start Time 1021    OT Stop Time 1100    OT Time Calculation (min) 39 min    Activity Tolerance Patient tolerated treatment well    Behavior During Therapy [facility] for tasks assessed/performed                Past Medical History:  Diagnosis Date   PUD (peptic ulcer disease) 2008   treated with meds   History reviewed. No pertinent surgical history. Patient Active Problem List   Diagnosis Date Noted   Acute ischemic left MCA stroke ([place]) 09/14/2021   Pressure injury of skin 09/11/2021   Stroke ([place]) 09/10/2021   Gastroesophageal reflux disease without esophagitis 08/24/2021   History of CVA (cerebrovascular accident) 08/11/2021   Dyslipidemia 01/24/2021   Essential hypertension    Hyponatremia    Polysubstance abuse ([place])    Cerebral thrombosis with cerebral infarction 01/11/2021   Acute CVA (cerebrovascular accident) ([place]) 01/11/2021   Right sided weakness 01/10/2021   AKI (acute kidney injury) ([place]) 01/10/2021   Tobacco abuse 01/10/2021   Cocaine abuse ([place]) 01/10/2021   Leukocytosis 01/10/2021   Cellulitis and abscess of upper arm and forearm 11/13/2012   Episodic tobacco abuse 11/13/2012    ONSET DATE: 09/10/21  REFERRING DIAG: G86.761 (ICD-10-CM) - Cerebral infarction due to unspecified occlusion or stenosis of left middle cerebral artery   THERAPY DIAG:  Hemiplegia and hemiparesis following cerebral infarction affecting right non-dominant side (HCC)  Other lack of coordination  Muscle weakness (generalized)  Acute pain of right  shoulder  Rationale for Evaluation and Treatment Rehabilitation  SUBJECTIVE:   SUBJECTIVE STATEMENT: No pain today.  "I haven't had pain at all the last week."  Pt reports d/c today due to transportation difficulties.  Pt reports improved thumb control this week.  Pt accompanied by: self  PERTINENT HISTORY: 56 y.o. male presents to [facility] hospital on 09/10/2021 with acute R weakenss and slurred speech. CT head negative. Pt received TNK. MRI demonstrates posterior L MCA infarct. PMH includes HTN, HLD, CVA, PUD, hx of cocaine abuse.  PRECAUTIONS: Other: no driving  WEIGHT BEARING RESTRICTIONS No  PAIN:  Are you having pain? No    OBJECTIVE:   HAND DOMINANCE: Left   TODAY'S TREATMENT:   12/15/21   Checked remaining goals and discussed progress--see below.  Also discussed indications for return to therapy if able in future.  Pt verbalized understanding.  Discussed reviewed previous HEPs/recommendations that pt is performing including:  wiping table, picking up bottle caps and empty bottles, folding laundry, sweeping, using RUE as assist for ADLs, turning on light switches/turning doorknobs and opening cabinets/drawers.        PATIENT EDUCATION: Education details: additions to HEP--see pt instructions Person educated: Patient Education method: Explanation, Demonstration, Verbal cues, and Handouts Education comprehension: verbalized understanding and returned demonstration    HOME EXERCISE PROGRAM: 10/11/21: initial HEP for RUE shoulder, hand, and simple coordination tasks 10/13/21: Recommendations on safety d/t lack of sensation Rt hand, and A/E recommendations 11/24/21: bed positioning handout  GOALS: Goals  reviewed with patient? Yes  SHORT TERM GOALS: Target date: 10/28/21   Pt will be independent with HEP targeting RUE shoulder and Rt hand coordination  Baseline: not yet addressed Goal status: ACHIEVED  2.  Pt will improve RUE function as evidenced by performing Box &  Blocks to 15 blocks Baseline: 5 blocks Goal status: ACHIEVED 16 blocks 10/27/21  3.  Pt will report pain Rt shoulder less than or equal to 3/10 with high level reaching Baseline: 5-6/10 Goal status: ACHIEVED reports 1-2/10 pain with flexion  4.  Pt will improve Rt hand grip strength to 24 lbs or greater Baseline: 14.3 lbs Goal status: ACHIEVED  18.7 lbs 10/27/21, 26 LBS 11/10/21  5.  Pt to verbalize understanding with safety and compensatory strategies for lack of sensation Rt hand Baseline: not yet addressed Goal status: ACHIEVED  6.  Pt to verbalize understanding for task modifications and A/E needs to increase independence with ADLS (rocker knife, shoe buttons, etc)  Baseline: not yet addressed Goal status: DEFERRED, no longer needed   LONG TERM GOALS: Target date: 01/10/22  Independent with updated HEP PRN Baseline: Not yet addressed Goal status: MET.  12/15/21  2.  Pt to improve Rt hand function as evidenced by performing 9 hole peg test in under 2 minutes Baseline: unable at evaluation Goal status: IN PROGRESS (11/21/21: placed 2 pegs in 2 min).   NOT MET 12/15/21  37mn 31.sec to complete test.  3.  Pt to return to performing snack/sandwich/microwaveable items at mod I level Baseline: dependent Goal status: MET  4.  Pt to return to light IADLS (making bed, folding laundry, etc) safely Baseline: dependent Goal status: IN PROGRESS (demo folding towels in clinic).   MET 12/15/21:  pt reports folding laundry, making bed, sweeping, wiping counter  5.  Pt to improve RUE function as evidenced by performing 25 blocks or Box & Blocks test Baseline: 5 blocks Goal status: IN PROGRESS (11/21/21: 17 blocks).   NOT MET.  12/15/21:  23 blocks  6.  Pt to use Rt hand as assist for bilateral tasks 50% of time Baseline: unable Goal status: MET at approx this level.  ASSESSMENT:  CLINICAL IMPRESSION: Pt has made good progress with improved RUE functional use.  However, pt requests  discharge at this time due to transportation challenges which will make it difficult to attend occupational therapy.   PERFORMANCE DEFICITS in functional skills including ADLs, IADLs, coordination, dexterity, proprioception, sensation, edema, tone, ROM, strength, pain, FMC, body mechanics, decreased knowledge of use of DME, and UE functional use,   IMPAIRMENTS are limiting patient from ADLs and IADLs.   COMORBIDITIES may have co-morbidities  that affects occupational performance. Patient will benefit from skilled OT to address above impairments and improve overall function.  MODIFICATION OR ASSISTANCE TO COMPLETE EVALUATION: No modification of tasks or assist necessary to complete an evaluation.  OT OCCUPATIONAL PROFILE AND HISTORY: Problem focused assessment: Including review of records relating to presenting problem.  CLINICAL DECISION MAKING: Moderate - several treatment options, min-mod task modification necessary  REHAB POTENTIAL: Good  EVALUATION COMPLEXITY: Low    PLAN: OT FREQUENCY: 2x/week  OT DURATION: additional 6 weeks   PLANNED INTERVENTIONS: self care/ADL training, therapeutic exercise, therapeutic activity, neuromuscular re-education, manual therapy, passive range of motion, functional mobility training, splinting, electrical stimulation, moist heat, cryotherapy, patient/family education, energy conservation, coping strategies training, and DME and/or AE instructions  RECOMMENDED OTHER SERVICES: none  CONSULTED AND AGREED WITH PLAN OF CARE: Patient  PLAN FOR  NEXT SESSION:  d/c OT per pt request due to transportation difficulties   OCCUPATIONAL THERAPY DISCHARGE SUMMARY  Visits from Start of Care: 16  Current functional level related to goals / functional outcomes: See above   Remaining deficits: R hemiparesis with decr ROM, decr coordination, and decr strength affecting RUE functional use  Pt reports no pain in last week.   Education / Equipment: Pt  educated in [employer] and  safety and compensatory strategies for lack of sensation.  Pt verbalized understanding of all education provided.  Patient agrees to discharge. Patient goals were partially met. Patient is being discharged due to the patient's request. Due to transportation challenges.         Jathen Sudano, OTR/L 12/15/2021, 10:53 AM

## 2021-12-15 NOTE — Patient Instructions (Addendum)
   Additional Coordination Activities:     Pick up coins and put in a container.    2.   Try opening putty container with right hand.    3.   Pick up bottle cap and flip it over  4.  Use tv remote with right hand   5.  Try fastening/unfastening larger buttons.  6.  Try twisting bottle caps on/off with right hand  7.  Place paperclips on envelope

## 2021-12-20 ENCOUNTER — Other Ambulatory Visit: Payer: Self-pay

## 2021-12-20 NOTE — Patient Outreach (Signed)
[facility]) Care Management  12/20/2021  KANEN MOTTOLA 09/26/1965 [id number]   First telephone outreach attempt to obtain mRS. No answer. Left message for returned call.  Vanice Sarah [facility] Management Assistant [phone]

## 2021-12-22 ENCOUNTER — Encounter: Payer: Self-pay | Admitting: Occupational Therapy

## 2021-12-23 ENCOUNTER — Other Ambulatory Visit: Payer: Self-pay

## 2021-12-23 NOTE — Patient Outreach (Signed)
[facility]) Care Management  12/23/2021  Christopher [name] Feb 13, 1966 [id number]   Second telephone outreach attempt to obtain mRS. No answer. Left message for returned call.  Vanice Sarah [facility] Management Assistant [phone]

## 2021-12-26 ENCOUNTER — Encounter: Payer: Self-pay | Admitting: Occupational Therapy

## 2021-12-27 ENCOUNTER — Other Ambulatory Visit: Payer: Self-pay

## 2021-12-27 NOTE — Patient Outreach (Signed)
[facility]) Care Management  12/27/2021  ABRAN GAVIGAN 07-Apr-1966 [id number]   3 outreach attempts were completed to obtain mRs. mRs could not be obtained because patient never returned my calls. mRs=7    Vanice Sarah Care Management Assistant [phone]

## 2021-12-28 ENCOUNTER — Encounter: Payer: Self-pay | Admitting: Occupational Therapy

## 2022-01-02 ENCOUNTER — Encounter: Payer: Self-pay | Admitting: Occupational Therapy

## 2022-01-20 ENCOUNTER — Telehealth: Payer: Self-pay | Admitting: Family Medicine

## 2022-01-20 NOTE — Telephone Encounter (Signed)
Medication Refill - Medication: blood pressure cuff/monitor  Has the patient contacted their pharmacy? No. Pt's sister states the cma told her she could order her one, but they have not heard anything  Preferred Pharmacy (with phone number or street name): [id number] - [place] ([address]), [place] - [address]  Has the patient been seen for an appointment in the last year OR does the patient have an upcoming appointment? Yes.

## 2022-01-23 ENCOUNTER — Other Ambulatory Visit: Payer: Self-pay | Admitting: *Deleted

## 2022-01-23 MED ORDER — BLOOD PRESSURE CUFF MISC: 0 refills | Status: DC

## 2022-01-23 NOTE — Telephone Encounter (Signed)
Blood pressure has been ordered on 01/23/2022

## 2022-01-24 ENCOUNTER — Ambulatory Visit (INDEPENDENT_AMBULATORY_CARE_PROVIDER_SITE_OTHER): Payer: Medicaid Other | Admitting: Family Medicine

## 2022-01-24 ENCOUNTER — Encounter: Payer: Self-pay | Admitting: Family Medicine

## 2022-01-24 VITALS — BP 160/76 | HR 71 | Temp 98.1°F | Resp 16 | Wt 145.8 lb

## 2022-01-24 DIAGNOSIS — K219 Gastro-esophageal reflux disease without esophagitis: Secondary | ICD-10-CM | POA: Diagnosis not present

## 2022-01-24 DIAGNOSIS — I10 Essential (primary) hypertension: Secondary | ICD-10-CM | POA: Diagnosis not present

## 2022-01-24 DIAGNOSIS — E785 Hyperlipidemia, unspecified: Secondary | ICD-10-CM | POA: Diagnosis not present

## 2022-01-24 MED ORDER — OMEPRAZOLE 20 MG PO CPDR: 20.0000 mg | DELAYED_RELEASE_CAPSULE | Freq: Every day | ORAL | 0 refills | Status: DC

## 2022-01-24 MED ORDER — AMLODIPINE BESYLATE 10 MG PO TABS: 10.0000 mg | ORAL_TABLET | Freq: Every day | ORAL | 0 refills | Status: DC

## 2022-01-24 MED ORDER — ATORVASTATIN CALCIUM 20 MG PO TABS: 20.0000 mg | ORAL_TABLET | Freq: Every day | ORAL | 1 refills | Status: DC

## 2022-01-24 NOTE — Progress Notes (Signed)
Established Patient Office Visit  Subjective    Patient ID: Christopher [name], male    DOB: 1966-02-28  Age: 56 y.o. MRN: [id number]  CC:  Chief Complaint  Patient presents with   Follow-up    HPI Christopher [name] presents for follow up of chronic med issues including hypertension. Patient denies acute complaints or concerns.    Outpatient Encounter Medications as of 01/24/2022  Medication Sig   acetaminophen (TYLENOL) 325 MG tablet Take 1-2 tablets (325-650 mg total) by mouth every 4 (four) hours as needed for mild pain.   amLODipine (NORVASC) 10 MG tablet Take 1 tablet (10 mg total) by mouth daily.   Blood Pressure Monitoring (BLOOD PRESSURE CUFF) MISC Use to take blood pressure 2 times a day   lisinopril (ZESTRIL) 20 MG tablet Take 1 tablet (20 mg total) by mouth daily.   [DISCONTINUED] atorvastatin (LIPITOR) 20 MG tablet Take 1 tablet (20 mg total) by mouth daily.   atorvastatin (LIPITOR) 20 MG tablet Take 1 tablet (20 mg total) by mouth daily.   omeprazole (PRILOSEC) 20 MG capsule Take 1 capsule (20 mg total) by mouth daily.   [DISCONTINUED] omeprazole (PRILOSEC) 20 MG capsule Take 1 capsule (20 mg total) by mouth daily.   No facility-administered encounter medications on file as of 01/24/2022.    Past Medical History:  Diagnosis Date   PUD (peptic ulcer disease) 2008   treated with meds    No past surgical history on file.  Family History  Problem Relation Age of Onset   Cancer Mother    Stroke Father     Social History   Socioeconomic History   Marital status: Divorced    Spouse name: Not on file   Number of children: Not on file   Years of education: Not on file   Highest education level: Not on file  Occupational History   Not on file  Tobacco Use   Smoking status: Every Day    Packs/day: 0.50    Types: Cigarettes   Smokeless tobacco: Former  Scientific laboratory technician Use: Never used  Substance and Sexual Activity   Alcohol use: Yes    Comment: 2-3  "sparks" a day   Drug use: Yes    Types: "Crack" cocaine    Comment: "only every once in awhile"   Sexual activity: Not Currently  Other Topics Concern   Not on file  Social History Narrative   Not on file   Social Determinants of Health   Financial Resource Strain: Not on file  Food Insecurity: Not on file  Transportation Needs: Not on file  Physical Activity: Not on file  Stress: Not on file  Social Connections: Not on file  Intimate Partner Violence: Not on file    Review of Systems  All other systems reviewed and are negative.       Objective    BP (!) 160/76   Pulse 71   Temp 98.1 F (36.7 C) (Oral)   Resp 16   Wt 145 lb 12.8 oz (66.1 kg)   SpO2 93%   BMI 21.53 kg/m   Physical Exam Vitals and nursing note reviewed.  Constitutional:      General: He is not in acute distress. Cardiovascular:     Rate and Rhythm: Normal rate and regular rhythm.  Pulmonary:     Effort: Pulmonary effort is normal.     Breath sounds: Normal breath sounds.  Abdominal:     Palpations: Abdomen is  soft.     Tenderness: There is no abdominal tenderness.  Neurological:     General: No focal deficit present.     Mental Status: He is alert and oriented to person, place, and time.         Assessment & Plan:   1. Essential hypertension Elevated reading. Will add amlodipine 10 mg to regime and monitor  2. Dyslipidemia Continue. Meds refilled.  - atorvastatin (LIPITOR) 20 MG tablet; Take 1 tablet (20 mg total) by mouth daily.  Dispense: 90 tablet; Refill: 1  3. Gastroesophageal reflux disease without esophagitis Continue. Meds refilled.  - omeprazole (PRILOSEC) 20 MG capsule; Take 1 capsule (20 mg total) by mouth daily.  Dispense: 90 capsule; Refill: 0    Return in about 3 weeks (around 02/14/2022) for follow up.   [name], MD

## 2022-01-26 ENCOUNTER — Encounter: Payer: Self-pay | Admitting: Physical Medicine & Rehabilitation

## 2022-01-26 ENCOUNTER — Encounter: Payer: Medicaid Other | Attending: Physical Medicine & Rehabilitation | Admitting: Physical Medicine & Rehabilitation

## 2022-01-26 VITALS — BP 148/87 | HR 75 | Ht 69.0 in | Wt 147.0 lb

## 2022-01-26 DIAGNOSIS — I69351 Hemiplegia and hemiparesis following cerebral infarction affecting right dominant side: Secondary | ICD-10-CM | POA: Insufficient documentation

## 2022-01-26 NOTE — Patient Instructions (Signed)
Please try to use the RIght hand as much as possible  Will see you in ~54mo

## 2022-01-26 NOTE — Progress Notes (Signed)
Subjective:    Patient ID: Christopher [name], male    DOB: 05-02-1966, 56 y.o.   MRN: [id number] 56 year old male with left MCA distribution stroke causing primarily right upper extremity weakness.  He is now living at home with family and does not require any assistance from them other than transportation. Admit date: 09/14/2021 Discharge date: 09/21/2021 HPI Pt denies ETOH or cocaine use , smokes 1/2 pack in 2 d No falls or new injuries.  RIght shoulder pain improving.  Has hx of Left shoulder tendinitis relieved by injection but pt states RIght shoulder is not bother him enough to consider that .   Patient is not driving He lives in 1 level home 3 steps to enter, mobile home he is divorced  Pain Inventory Average Pain 0 Pain Right Now 0 My pain is  No pain  LOCATION OF PAIN  No pain  BOWEL Number of stools per week: 7   BLADDER Normal    Mobility walk without assistance do you drive?  no Do you have any goals in this area?  yes  Function not employed: date last employed .  Neuro/Psych No problems in this area  Prior Studies Any changes since last visit?  no  Physicians involved in your care Any changes since last visit?  no   Family History  Problem Relation Age of Onset   Cancer Mother    Stroke Father    Social History   Socioeconomic History   Marital status: Divorced    Spouse name: Not on file   Number of children: Not on file   Years of education: Not on file   Highest education level: Not on file  Occupational History   Not on file  Tobacco Use   Smoking status: Every Day    Packs/day: 0.50    Types: Cigarettes   Smokeless tobacco: Former  Scientific laboratory technician Use: Never used  Substance and Sexual Activity   Alcohol use: Yes    Comment: 2-3 "sparks" a day   Drug use: Yes    Types: "Crack" cocaine    Comment: "only every once in awhile"   Sexual activity: Not Currently  Other Topics Concern   Not on file  Social History Narrative    Not on file   Social Determinants of Health   Financial Resource Strain: Not on file  Food Insecurity: Not on file  Transportation Needs: Not on file  Physical Activity: Not on file  Stress: Not on file  Social Connections: Not on file   History reviewed. No pertinent surgical history. Past Medical History:  Diagnosis Date   PUD (peptic ulcer disease) 2008   treated with meds   BP (!) 148/87   Pulse 75   Ht 5\' 9"  (1.753 m)   Wt 147 lb (66.7 kg)   SpO2 95%   BMI 21.71 kg/m   Opioid Risk Score:   Fall Risk Score:  `1  Depression screen [facility] 2/9     01/26/2022    9:28 AM 12/13/2021   10:53 AM 11/03/2021   10:18 AM 08/10/2021    3:21 PM 06/09/2021    1:33 PM 04/08/2021   11:58 AM 03/10/2021    3:17 PM  Depression screen PHQ 2/9  Decreased Interest 0 0 3 0 0 0 0  Down, Depressed, Hopeless 0 0 0 0 0 1 0  PHQ - 2 Score 0 0 3 0 0 1 0  Altered sleeping  0 0  0 0 0  Tired, decreased energy  0 0  0 1 0  Change in appetite  0 0  0 3 0  Feeling bad or failure about yourself   0 0  0 0 0  Trouble concentrating  0 0  0 0 0  Moving slowly or fidgety/restless  0 0  0 0 0  Suicidal thoughts  0 0  0 0 0  PHQ-9 Score  0 3  0 5 0  Difficult doing work/chores  Not difficult at all Not difficult at all   Not difficult at all       Review of Systems  All other systems reviewed and are negative.     Objective:   Physical Exam Vitals and nursing note reviewed.  Constitutional:      Appearance: Normal appearance.  HENT:     Head: Normocephalic and atraumatic.  Eyes:     Extraocular Movements: Extraocular movements intact.     Conjunctiva/sclera: Conjunctivae normal.     Pupils: Pupils are equal, round, and reactive to light.  Skin:    General: Skin is warm and dry.  Neurological:     Mental Status: He is alert.     Comments: Motor strength is 4/5 in the right deltoid by stress of grip 5/5 in left deltoid bicep tricep grip 5/5 bilateral hip flexor knee extensor ankle  dorsiflexor Ambulates without assistive device no evidence of toe drag or knee instability Right upper extremity has decreased fine motor finger thumb opposition is slow and imprecise.  Positive dysdiadochokinesis with rapid alternating supination pronation of the right upper extremity compared to normal on the left           Assessment & Plan:   1.  Left MCA distribution infarct aphasia has resolved, still has primarily right upper extremity weakness and poor motor control.  We discussed incorporating right upper extremity use into daily activities.  Continue OT.  I will see the patient back in approximately 2 to 3 months.  Follow-up with neurology.  Abstinence from cocaine and goal of quitting smoking.  He states he has cut down quite a bit.

## 2022-02-14 ENCOUNTER — Encounter: Payer: Self-pay | Admitting: Family Medicine

## 2022-02-14 ENCOUNTER — Ambulatory Visit (INDEPENDENT_AMBULATORY_CARE_PROVIDER_SITE_OTHER): Payer: Medicaid Other | Admitting: Family Medicine

## 2022-02-14 VITALS — BP 135/72 | HR 80 | Temp 98.1°F | Resp 16 | Wt 147.0 lb

## 2022-02-14 DIAGNOSIS — I10 Essential (primary) hypertension: Secondary | ICD-10-CM | POA: Diagnosis not present

## 2022-02-14 DIAGNOSIS — E785 Hyperlipidemia, unspecified: Secondary | ICD-10-CM

## 2022-02-14 MED ORDER — ATORVASTATIN CALCIUM 20 MG PO TABS: 20.0000 mg | ORAL_TABLET | Freq: Every day | ORAL | 1 refills | Status: DC

## 2022-02-14 MED ORDER — LISINOPRIL 20 MG PO TABS: 20.0000 mg | ORAL_TABLET | Freq: Every day | ORAL | 1 refills | Status: DC

## 2022-02-14 MED ORDER — AMLODIPINE BESYLATE 10 MG PO TABS: 10.0000 mg | ORAL_TABLET | Freq: Every day | ORAL | 1 refills | Status: DC

## 2022-02-16 NOTE — Progress Notes (Signed)
Established Patient Office Visit  Subjective    Patient ID: Christopher [name], male    DOB: 02/27/1966  Age: 56 y.o. MRN: [id number]  CC: No chief complaint on file.   HPI TAYLON COOLE presents for follow up of hypertension. Patient denies acute complaints or concerns.    Outpatient Encounter Medications as of 02/14/2022  Medication Sig   acetaminophen (TYLENOL) 325 MG tablet Take 1-2 tablets (325-650 mg total) by mouth every 4 (four) hours as needed for mild pain.   Blood Pressure Monitoring (BLOOD PRESSURE CUFF) MISC Use to take blood pressure 2 times a day   omeprazole (PRILOSEC) 20 MG capsule Take 1 capsule (20 mg total) by mouth daily.   [DISCONTINUED] amLODipine (NORVASC) 10 MG tablet Take 1 tablet (10 mg total) by mouth daily.   [DISCONTINUED] atorvastatin (LIPITOR) 20 MG tablet Take 1 tablet (20 mg total) by mouth daily.   [DISCONTINUED] lisinopril (ZESTRIL) 20 MG tablet Take 1 tablet (20 mg total) by mouth daily.   amLODipine (NORVASC) 10 MG tablet Take 1 tablet (10 mg total) by mouth daily.   atorvastatin (LIPITOR) 20 MG tablet Take 1 tablet (20 mg total) by mouth daily.   lisinopril (ZESTRIL) 20 MG tablet Take 1 tablet (20 mg total) by mouth daily.   No facility-administered encounter medications on file as of 02/14/2022.    Past Medical History:  Diagnosis Date   PUD (peptic ulcer disease) 2008   treated with meds    History reviewed. No pertinent surgical history.  Family History  Problem Relation Age of Onset   Cancer Mother    Stroke Father     Social History   Socioeconomic History   Marital status: Divorced    Spouse name: Not on file   Number of children: Not on file   Years of education: Not on file   Highest education level: Not on file  Occupational History   Not on file  Tobacco Use   Smoking status: Every Day    Packs/day: 0.50    Types: Cigarettes   Smokeless tobacco: Former  Scientific laboratory technician Use: Never used  Substance and  Sexual Activity   Alcohol use: Yes    Comment: 2-3 "sparks" a day   Drug use: Yes    Types: "Crack" cocaine    Comment: "only every once in awhile"   Sexual activity: Not Currently  Other Topics Concern   Not on file  Social History Narrative   Not on file   Social Determinants of Health   Financial Resource Strain: Not on file  Food Insecurity: Not on file  Transportation Needs: Not on file  Physical Activity: Not on file  Stress: Not on file  Social Connections: Not on file  Intimate Partner Violence: Not on file    Review of Systems  All other systems reviewed and are negative.       Objective    BP 135/72   Pulse 80   Temp 98.1 F (36.7 C) (Oral)   Resp 16   Wt 147 lb (66.7 kg)   SpO2 94%   BMI 21.71 kg/m   Physical Exam Vitals and nursing note reviewed.  Constitutional:      General: He is not in acute distress. Cardiovascular:     Rate and Rhythm: Normal rate and regular rhythm.  Pulmonary:     Effort: Pulmonary effort is normal.     Breath sounds: Normal breath sounds.  Abdominal:  Palpations: Abdomen is soft.     Tenderness: There is no abdominal tenderness.  Neurological:     General: No focal deficit present.     Mental Status: He is alert and oriented to person, place, and time.         Assessment & Plan:   1. Essential hypertension Improved with present management. Continue and monitor meds refilled.   2. Dyslipidemia Continue. Meds refilled.  - atorvastatin (LIPITOR) 20 MG tablet; Take 1 tablet (20 mg total) by mouth daily.  Dispense: 90 tablet; Refill: 1    Return in about 6 months (around 08/16/2022) for follow up.   [name], MD

## 2022-04-06 ENCOUNTER — Other Ambulatory Visit: Payer: Self-pay | Admitting: Family Medicine

## 2022-04-06 NOTE — Telephone Encounter (Signed)
Copied from CRM [id number]. Topic: General - Other >> Apr 06, 2022  4:20 PM Everette C wrote: Reason for CRM: Medication Refill - Medication: amLODipine (NORVASC) 10 MG tablet [[id number]]  Has the patient contacted their pharmacy? Yes.  The patient's sister has been directed to contact their PCP  (Agent: If no, request that the patient contact the pharmacy for the refill. If patient does not wish to contact the pharmacy document the reason why and proceed with request.) (Agent: If yes, when and what did the pharmacy advise?)  Preferred Pharmacy (with phone number or street name): [facility] [address] ([address] [place] - [address] [place] ([place] [address] Phone: [phone] Fax: [phone] Hours: Not open 24 hours   Has the patient been seen for an appointment in the last year OR does the patient have an upcoming appointment? Yes.    Agent: Please be advised that RX refills may take up to 3 business days. We ask that you follow-up with your pharmacy.

## 2022-04-07 NOTE — Telephone Encounter (Signed)
Unable to refill per protocol, Rx request is too soon. Last refill 02/14/22 for 90 and 1RF. Will refuse.  Requested Prescriptions  Pending Prescriptions Disp Refills   amLODipine (NORVASC) 10 MG tablet 90 tablet 1    Sig: Take 1 tablet (10 mg total) by mouth daily.     Cardiovascular: Calcium Channel Blockers 2 Passed - 04/07/2022  8:35 AM      Passed - Last BP in normal range    BP Readings from Last 1 Encounters:  02/14/22 135/72         Passed - Last Heart Rate in normal range    Pulse Readings from Last 1 Encounters:  02/14/22 80         Passed - Valid encounter within last 6 months    Recent Outpatient Visits           1 month ago Essential hypertension   Primary Care at [facility], MD   2 months ago Essential hypertension   Primary Care at [facility], MD   3 months ago Essential hypertension   Primary Care at [facility], MD   5 months ago Hospital discharge follow-up   Primary Care at [facility], Amy J, NP   7 months ago AKI (acute kidney injury) [facility])   Primary Care at [facility], Kasandra Knudsen, PA-C       Future Appointments             In 4 months Georganna Skeans, MD Primary Care at [facility]

## 2022-04-24 ENCOUNTER — Other Ambulatory Visit: Payer: Self-pay | Admitting: Family Medicine

## 2022-04-24 DIAGNOSIS — K219 Gastro-esophageal reflux disease without esophagitis: Secondary | ICD-10-CM

## 2022-04-24 NOTE — Telephone Encounter (Signed)
Medication Refill - Medication: omeprazole (PRILOSEC) 20 MG capsule   Has the patient contacted their pharmacy? Yes.     Preferred Pharmacy (with phone number or street name):  [facility] [address] [place]), [place] - [id number] Phone: [phone]  Fax: [phone]     Has the patient been seen for an appointment in the last year OR does the patient have an upcoming appointment? Yes.    Please assist patient

## 2022-04-25 ENCOUNTER — Encounter: Payer: Medicaid Other | Admitting: Physical Medicine & Rehabilitation

## 2022-04-25 MED ORDER — OMEPRAZOLE 20 MG PO CPDR: 20.0000 mg | DELAYED_RELEASE_CAPSULE | Freq: Every day | ORAL | 0 refills | Status: DC

## 2022-04-25 NOTE — Telephone Encounter (Signed)
Requested medication (s) are due for refill today: yes  Requested medication (s) are on the active medication list: yes- but end date is listed   Last refill:  01/24/22 # 90   Future visit scheduled: yes  Notes to clinic:  expired 04/24/22    Requested Prescriptions  Pending Prescriptions Disp Refills   omeprazole (PRILOSEC) 20 MG capsule 90 capsule 0    Sig: Take 1 capsule (20 mg total) by mouth daily.     Gastroenterology: Proton Pump Inhibitors Passed - 04/24/2022  2:49 PM      Passed - Valid encounter within last 12 months    Recent Outpatient Visits           2 months ago Essential hypertension   Primary Care at [facility], MD   3 months ago Essential hypertension   Primary Care at [facility], MD   4 months ago Essential hypertension   Primary Care at [facility], MD   6 months ago Hospital discharge follow-up   Primary Care at [facility], Amy J, NP   7 months ago AKI (acute kidney injury) [facility])   Primary Care at [facility], Kasandra Knudsen, PA-C       Future Appointments             In 3 months Georganna Skeans, MD Primary Care at [facility]

## 2022-05-05 ENCOUNTER — Encounter: Payer: Self-pay | Admitting: Physical Medicine & Rehabilitation

## 2022-05-05 ENCOUNTER — Encounter: Payer: Medicaid Other | Attending: Physical Medicine & Rehabilitation | Admitting: Physical Medicine & Rehabilitation

## 2022-05-05 VITALS — BP 149/77 | HR 88 | Ht 69.0 in | Wt 150.0 lb

## 2022-05-05 DIAGNOSIS — R488 Other symbolic dysfunctions: Secondary | ICD-10-CM

## 2022-05-05 DIAGNOSIS — I69351 Hemiplegia and hemiparesis following cerebral infarction affecting right dominant side: Secondary | ICD-10-CM | POA: Diagnosis present

## 2022-05-05 DIAGNOSIS — I69322 Dysarthria following cerebral infarction: Secondary | ICD-10-CM | POA: Diagnosis not present

## 2022-05-05 NOTE — Progress Notes (Signed)
Subjective:    Patient ID: YOUSIF [name], male    DOB: 04/19/1966, 56 y.o.   MRN: [id number] Christopher [name] is a 56 y.o. LH-male with history of tobacco, marijuana and cocaine use otherwise in good health who was admitted on 01/10/2021 with 1 day history of slurred speech and left hemiparesis.  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  UDS was positive for cocaine and THC.  Dr. Roda Shutters felt the stroke was likely due to cocaine use and recommended DAPT x3 weeks followed by aspirin alone.  Therapy evaluations done revealing mild dysarthria with unsteady gait and balance deficits.  CIR was recommended due to functional decline   Admit date: 01/12/2021 Discharge date: 01/21/2021 HPI Patient is still noting some mild difficulty with word finding.  He feels like his right arm has gotten stronger.  He is left-handed. Right shoulder pain better , Right hand numbness persist  Trying to quit smoking Denies cocaine use, occ marijuana, drinks 2-4 beers per day Uses sponges and putty for RUE Picks up bottle caps Pain Inventory Average Pain 0 Pain Right Now 0 My pain is constant, dull, and aching  In the last 24 hours, has pain interfered with the following? General activity 2 Relation with others 0 Enjoyment of life 2 What TIME of day is your pain at its worst? varies Sleep (in general) Fair  Pain is worse with: inactivity and some activites Pain improves with: therapy/exercise Relief from Meds: 0  Family History  Problem Relation Age of Onset   Cancer Mother    Stroke Father    Social History   Socioeconomic History   Marital status: Divorced    Spouse name: Not on file   Number of children: Not on file   Years of education: Not on file   Highest education level:  Not on file  Occupational History   Not on file  Tobacco Use   Smoking status: Every Day    Packs/day: 0.50    Types: Cigarettes   Smokeless tobacco: Former  Building services engineer Use: Never used  Substance and Sexual Activity   Alcohol use: Yes    Comment: 2-3 "sparks" a day   Drug use: Yes    Types: "Crack" cocaine    Comment: "only every once in awhile"   Sexual activity: Not Currently  Other Topics Concern   Not on file  Social History Narrative   Not on file   Social Determinants of Health   Financial Resource Strain: Not on file  Food Insecurity: Not on file  Transportation Needs: Not on file  Physical Activity: Not on file  Stress: Not on file  Social Connections: Not on file   No past surgical history on file. No past surgical history on file. Past Medical History:  Diagnosis Date   PUD (peptic ulcer disease) 2008   treated with meds   Ht 5\' 9"  (1.753 m)   Wt 150 lb (68 kg)   BMI 22.15 kg/m   Opioid Risk Score:   Fall Risk Score:  `1  Depression screen [facility] 2/9     05/05/2022   10:44 AM 01/26/2022    9:28 AM 12/13/2021   10:53 AM 11/03/2021   10:18 AM 08/10/2021    3:21 PM 06/09/2021  1:33 PM 04/08/2021   11:58 AM  Depression screen PHQ 2/9  Decreased Interest 0 0 0 3 0 0 0  Down, Depressed, Hopeless 0 0 0 0 0 0 1  PHQ - 2 Score 0 0 0 3 0 0 1  Altered sleeping   0 0  0 0  Tired, decreased energy   0 0  0 1  Change in appetite   0 0  0 3  Feeling bad or failure about yourself    0 0  0 0  Trouble concentrating   0 0  0 0  Moving slowly or fidgety/restless   0 0  0 0  Suicidal thoughts   0 0  0 0  PHQ-9 Score   0 3  0 5  Difficult doing work/chores   Not difficult at all Not difficult at all   Not difficult at all      Review of Systems  Musculoskeletal:        Right hand pain   All other systems reviewed and are negative.     Objective:   Physical Exam  Motor strength is 4/5 in the right deltoid, bicep, tricep, finger flexors and  extensors Fine motor is reduced with finger thumb opposition right upper extremity. Tone has mildly elevated finger flexor tone MAS 1 Right lower extremity 5/5 strength in the hip flexor knee extensor ankle dorsiflexor Left upper and left lower limb are both 5/5 strength with normal tone. Ambulates without assistive device no evidence of toe drag and instability Speech with mild dysarthria he also has mild anomia      Assessment & Plan:  1.  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  In addition the patient has mild dysarthria and a anomia. As discussed with patient he should continue with his home exercise program Continue activity as tolerated using the right upper extremity is much as possible We discussed that speech is likely to continue improving over the next 6 months. Physical medicine rehab follow-up on as-needed basis, no formal therapy needed at this time Follow-up with PCP Follow-up with neurology

## 2022-06-13 ENCOUNTER — Encounter: Payer: Self-pay | Admitting: Neurology

## 2022-06-13 ENCOUNTER — Ambulatory Visit (INDEPENDENT_AMBULATORY_CARE_PROVIDER_SITE_OTHER): Payer: Medicaid Other | Admitting: Neurology

## 2022-06-13 ENCOUNTER — Other Ambulatory Visit: Payer: Self-pay

## 2022-06-13 VITALS — BP 131/67 | HR 93 | Ht 69.0 in | Wt 152.0 lb

## 2022-06-13 DIAGNOSIS — R29898 Other symptoms and signs involving the musculoskeletal system: Secondary | ICD-10-CM

## 2022-06-13 DIAGNOSIS — M542 Cervicalgia: Secondary | ICD-10-CM

## 2022-06-13 DIAGNOSIS — Z8673 Personal history of transient ischemic attack (TIA), and cerebral infarction without residual deficits: Secondary | ICD-10-CM

## 2022-06-13 MED ORDER — ASPIRIN 325 MG PO TBEC: 325.0000 mg | DELAYED_RELEASE_TABLET | Freq: Every day | ORAL | 1 refills | Status: DC

## 2022-06-13 NOTE — Progress Notes (Signed)
[facility] [address] [place]. [place] [address] [phone]       OFFICE FOLLOW-UP NOTE  [name] Date of Birth:  05-30-1965 Medical Record Number:  [id number]   HPI: Initial visit 11/30/2021 Christopher [name] is a 57 year old Caucasian male seen today for initial office follow-up visit following hospital admission for stroke in May 2023.  History is obtained from the patient and review of electronic medical records.  I have personally reviewed pertinent available imaging films in PACS.  He has past medical history of hypertension, hyperlipidemia, peripheral vascular disease, stroke and history of cocaine, tobacco and marijuana abuse who [facility] on 09/10/2021 with sudden onset of slurred speech and right-sided weakness.  NIH stroke scale on admission was 5.  Code stroke was called and CT head on admission showed an old right occipital infarct which was new compared to previous CT from September.  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  He was treated with thrombolysis with and kept in the ICU with close neurological monitoring and strict blood pressure control.  MRI scan subsequently showed acute posterior left MCA perirolandic infarct with some petechial hemorrhage.  2D echo showed ejection fraction 60 to 65% without cardiac source of embolism.  LDL cholesterol 68 mg percent and hemoglobin A1c was 6.2.  Patient was on no antithrombotic therapy prior to admission and was switched to aspirin.  He showed improvement during hospitalization but was felt to be a good candidate for inpatient rehab and was transferred there.  Patient has been discharged home and is currently doing outpatient physical occupational therapy.  His speech has improved back to normal rate  still has some residual weakness in his right hand and diminished fine motor skills.  He states he is quit cocaine completely and has cut back smoking cigarettes to half pack per day.  He occasionally uses marijuana still.  He states his blood pressure is normally not high but today it is elevated in office at 179/94.  Plans to see his primary care physician and discuss medications for this soon.  He is tolerating Lipitor well without muscle aches and pain and aspirin without bruising or bleeding.  He has no complaints today. Update 06/13/2022 ; he returns for follow-up after last visit 6 months ago.  Patient states he is doing well and has not had any recurrent stroke or TIA symptoms.  He remains on aspirin 325 mg daily which is tolerating well without side effects.  He states his blood pressure is under good control and today it is 131/69.  He however continues to smoke about half pack per day and knows that he needs to cut back and quit.  He is tolerating Lipitor well without muscle aches and pains.  He has not had any lab work for lipid profile to check.  He has a new complaint of posterior neck pain.  He describes a tender spot in his left suboccipital region pain shooting down muscle tightness left side.  He feels he gets some partial relief by rubbing that area.  Tylenol also eases the pain but it returns when the effect wears off.  He has not tried any muscle relaxant.  He has applied for disability which was denied but he is now appealing through a Chief Executive Officer.  He continues to have weakness and right hand with diminished fine motor skills as well as sensation has difficulty using his right hand  ROS:   14 system review of systems is positive for dysarthria, facial weakness, and weakness all other systems negative  PMH:  Past Medical History:  Diagnosis Date   PUD (peptic ulcer disease) 2008   treated with meds    Social History:  Social History   Socioeconomic History   Marital status: Divorced     Spouse name: Not on file   Number of children: Not on file   Years of education: Not on file   Highest education level: Not on file  Occupational History   Not on file  Tobacco Use   Smoking status: Every Day    Packs/day: 0.50    Types: Cigarettes   Smokeless tobacco: Former  Scientific laboratory technician Use: Never used  Substance and Sexual Activity   Alcohol use: Yes    Comment: 2-3 "sparks" a day   Drug use: Yes    Types: "Crack" cocaine    Comment: "only every once in awhile"   Sexual activity: Not Currently  Other Topics Concern   Not on file  Social History Narrative   Not on file   Social Determinants of Health   Financial Resource Strain: Not on file  Food Insecurity: Not on file  Transportation Needs: Not on file  Physical Activity: Not on file  Stress: Not on file  Social Connections: Not on file  Intimate Partner Violence: Not on file    Medications:   Current Outpatient Medications on File Prior to Visit  Medication Sig Dispense Refill   acetaminophen (TYLENOL) 325 MG tablet Take 1-2 tablets (325-650 mg total) by mouth every 4 (four) hours as needed for mild pain.     amLODipine (NORVASC) 10 MG tablet Take 1 tablet (10 mg total) by mouth daily. 90 tablet 1   atorvastatin (LIPITOR) 20 MG tablet Take 1 tablet (20 mg total) by mouth daily. 90 tablet 1   Blood Pressure Monitoring (BLOOD PRESSURE CUFF) MISC Use to take blood pressure 2 times a day 1 each 0   lisinopril (ZESTRIL) 20 MG tablet Take 1 tablet (20 mg total) by mouth daily. 90 tablet 1   omeprazole (PRILOSEC) 20 MG capsule Take 1 capsule (20 mg total) by mouth daily. 90 capsule 0   No current facility-administered medications on file prior to visit.    Allergies:  No Known Allergies  Physical Exam General: well developed, well nourished middle-aged Caucasian male, seated, in no evident distress Head: head normocephalic and atraumatic.  Neck: supple with no carotid or supraclavicular  bruits Cardiovascular: regular rate and rhythm, no murmurs Musculoskeletal: no deformity.  Mild tenderness in posterior neck muscles on the left Skin:  no rash/petichiae Vascular:  Normal pulses all extremities Vitals:   06/13/22 1253  BP: 131/67  Pulse: 93   Neurologic Exam Mental Status: Awake and fully alert. Oriented to place and time. Recent and remote memory intact. Attention span, concentration and fund of knowledge appropriate. Mood and affect appropriate.  Mild dysarthria.  No aphasia. Cranial Nerves: Fundoscopic exam not done. Pupils equal, briskly reactive to light. Extraocular movements full without nystagmus. Visual fields full to confrontation. Hearing intact. Facial sensation intact.  Right lower facial weakness.  Tongue, palate moves normally and symmetrically.  Motor: Normal bulk and tone. Normal strength in all tested extremity muscles except weakness of right grip and intrinsic hand muscles.  Orbits left over right upper extremity.. Sensory.: intact to touch ,pinprick .position and vibratory sensation except some diminished sensation in the right.  Coordination: Rapid alternating movements normal in all extremities. Finger-to-nose and heel-to-shin performed accurately bilaterally. Gait and Station: Arises from chair without difficulty. Stance is normal. Gait demonstrates normal stride length and balance . Able to heel, toe and tandem walk without difficulty.  Reflexes: 1+ and symmetric. Toes downgoing.      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  Multiple vascular risk factors of intracranial atherosclerosis, hyperlipidemia, hypertension, cocaine abuse, cigarette smoking and marijuana abuse.     PLAN: I had a long d/w patient about his remote stroke, risk for recurrent stroke/TIAs, personally independently reviewed imaging studies and stroke evaluation results  and answered questions.Continue aspirin 325 mg daily  for secondary stroke prevention and maintain strict control of hypertension with blood pressure goal below 130/90, diabetes with hemoglobin A1c goal below 6.5% and lipids with LDL cholesterol goal below 70 mg/dL I have counseled him to quit smoking completely knee surgery try.. I also advised the patient to eat a healthy diet with plenty of whole grains, cereals, fruits and vegetables, exercise regularly and maintain ideal body weight .recommend check screening lipid profile, hemoglobin A1c and carotid ultrasound.  I recommend he do regular neck stretching exercises for his muscular left-sided neck pain and also apply local muscle relaxation cream as needed.  Followup in the future with me in 1 year or call earlier if necessary.Greater than 50% of time during this 35 minute visit was spent on counseling,explanation of diagnosis of stroke, planning of further management, discussion with patient and family and coordination of care Antony Contras, MD Note: This document was prepared with digital dictation and possible smart phrase technology. Any transcriptional errors that result from this process are unintentional

## 2022-06-13 NOTE — Patient Instructions (Signed)
I had a long d/w patient about his remote stroke, risk for recurrent stroke/TIAs, personally independently reviewed imaging studies and stroke evaluation results and answered questions.Continue aspirin 325 mg daily  for secondary stroke prevention and maintain strict control of hypertension with blood pressure goal below 130/90, diabetes with hemoglobin A1c goal below 6.5% and lipids with LDL cholesterol goal below 70 mg/dL I have counseled him to quit smoking completely knee surgery try.. I also advised the patient to eat a healthy diet with plenty of whole grains, cereals, fruits and vegetables, exercise regularly and maintain ideal body weight .recommend check screening lipid profile, hemoglobin A1c and carotid ultrasound.  I recommend he do regular neck stretching exercises for his muscular left-sided neck pain and also apply local muscle relaxation cream as needed.  Followup in the future with me in 1 year or call earlier if necessary.   Neck Exercises Ask your health care provider which exercises are safe for you. Do exercises exactly as told by your health care provider and adjust them as directed. It is normal to feel mild stretching, pulling, tightness, or discomfort as you do these exercises. Stop right away if you feel sudden pain or your pain gets worse. Do not begin these exercises until told by your health care provider. Neck exercises can be important for many reasons. They can improve strength and maintain flexibility in your neck, which will help your upper back and prevent neck pain. Stretching exercises Rotation neck stretching  Sit in a chair or stand up. Place your feet flat on the floor, shoulder-width apart. Slowly turn your head (rotate) to the right until a slight stretch is felt. Turn it all the way to the right so you can look over your right shoulder. Do not tilt or tip your head. Hold this position for 10-30 seconds. Slowly turn your head (rotate) to the left until a slight  stretch is felt. Turn it all the way to the left so you can look over your left shoulder. Do not tilt or tip your head. Hold this position for 10-30 seconds. Repeat __________ times. Complete this exercise __________ times a day. Neck retraction  Sit in a sturdy chair or stand up. Look straight ahead. Do not bend your neck. Use your fingers to push your chin backward (retraction). Do not bend your neck for this movement. Continue to face straight ahead. If you are doing the exercise properly, you will feel a slight sensation in your throat and a stretch at the back of your neck. Hold the stretch for 1-2 seconds. Repeat __________ times. Complete this exercise __________ times a day. Strengthening exercises Neck press  Lie on your back on a firm bed or on the floor with a pillow under your head. Use your neck muscles to push your head down on the pillow and straighten your spine. Hold the position as well as you can. Keep your head facing up (in a neutral position) and your chin tucked. Slowly count to 5 while holding this position. Repeat __________ times. Complete this exercise __________ times a day. Isometrics These are exercises in which you strengthen the muscles in your neck while keeping your neck still (isometrics). Sit in a supportive chair and place your hand on your forehead. Keep your head and face facing straight ahead. Do not flex or extend your neck while doing isometrics. Push forward with your head and neck while pushing back with your hand. Hold for 10 seconds. Do the sequence again, this time putting  your hand against the back of your head. Use your head and neck to push backward against the hand pressure. Finally, do the same exercise on either side of your head, pushing sideways against the pressure of your hand. Repeat __________ times. Complete this exercise __________ times a day. Prone head lifts  Lie face-down (prone position), resting on your elbows so that your  chest and upper back are raised. Start with your head facing downward, near your chest. Position your chin either on or near your chest. Slowly lift your head upward. Lift until you are looking straight ahead. Then continue lifting your head as far back as you can comfortably stretch. Hold your head up for 5 seconds. Then slowly lower it to your starting position. Repeat __________ times. Complete this exercise __________ times a day. Supine head lifts  Lie on your back (supine position), bending your knees to point to the ceiling and keeping your feet flat on the floor. Lift your head slowly off the floor, raising your chin toward your chest. Hold for 5 seconds. Repeat __________ times. Complete this exercise __________ times a day. Scapular retraction  Stand with your arms at your sides. Look straight ahead. Slowly pull both shoulders (scapulae) backward and downward (retraction) until you feel a stretch between your shoulder blades in your upper back. Hold for 10-30 seconds. Relax and repeat. Repeat __________ times. Complete this exercise __________ times a day. Contact a health care provider if: Your neck pain or discomfort gets worse when you do an exercise. Your neck pain or discomfort does not improve within 2 hours after you exercise. If you have any of these problems, stop exercising right away. Do not do the exercises again unless your health care provider says that you can. Get help right away if: You develop sudden, severe neck pain. If this happens, stop exercising right away. Do not do the exercises again unless your health care provider says that you can. This information is not intended to replace advice given to you by your health care provider. Make sure you discuss any questions you have with your health care provider. Document Revised: 10/19/2020 Document Reviewed: 10/19/2020 Elsevier Patient Education  [place].

## 2022-06-13 NOTE — Addendum Note (Signed)
Addended by: Andre Lefort on: 06/13/2022 02:35 PM   Modules accepted: Orders

## 2022-06-14 LAB — HEMOGLOBIN A1C
Est. average glucose Bld gHb Est-mCnc: 114 mg/dL
Hgb A1c MFr Bld: 5.6 % (ref 4.8–5.6)

## 2022-06-14 LAB — LIPID PANEL
Chol/HDL Ratio: 3 ratio (ref 0.0–5.0)
Cholesterol, Total: 131 mg/dL (ref 100–199)
HDL: 44 mg/dL (ref >39)
LDL Chol Calc (NIH): 55 mg/dL (ref 0–99)
Triglycerides: 198 mg/dL — ABNORMAL HIGH (ref 0–149)
VLDL Cholesterol Cal: 32 mg/dL (ref 5–40)

## 2022-06-18 NOTE — Progress Notes (Signed)
Kindly inform the patient that screening test for diabetes is satisfactory.  Cholesterol profile is mostly okay except triglycerides are elevated and to see primary care physician to discuss treatment options for the same.

## 2022-06-19 ENCOUNTER — Telehealth: Payer: Self-pay | Admitting: Neurology

## 2022-06-19 NOTE — Telephone Encounter (Signed)
Pt's sister is asking for a call back in response to a call about results for pt.

## 2022-06-19 NOTE — Telephone Encounter (Signed)
Christopher Fila, MD  [name] 2 Results Cc: Dorna Mai, MD Kindly inform the patient that screening test for diabetes is satisfactory.  Cholesterol profile is mostly okay except triglycerides are elevated and to see primary care physician to discuss treatment options for the same.

## 2022-06-19 NOTE — Telephone Encounter (Signed)
I called pt's sister back and we discussed results. She verbalized understanding and appreciation for the call.  Pt scheduled to see PCP in April.

## 2022-06-29 ENCOUNTER — Ambulatory Visit (HOSPITAL_COMMUNITY)
Admission: RE | Admit: 2022-06-29 | Discharge: 2022-06-29 | Disposition: A | Discharge status: Home / Self Care | Payer: Medicaid Other | Source: Ambulatory Visit | Attending: Neurology | Admitting: Neurology

## 2022-06-29 DIAGNOSIS — Z8673 Personal history of transient ischemic attack (TIA), and cerebral infarction without residual deficits: Secondary | ICD-10-CM

## 2022-06-29 NOTE — Progress Notes (Signed)
Carotid duplex has been completed.   Preliminary results in CV Proc.   Tanette Chauca Wai Minotti 06/29/2022 10:04 AM

## 2022-06-30 NOTE — Progress Notes (Signed)
Kindly inform the patient that carotid ultrasound study showed no significant narrowing of either carotid artery in the neck.

## 2022-07-03 ENCOUNTER — Telehealth: Payer: Self-pay

## 2022-07-03 NOTE — Telephone Encounter (Signed)
I spoke with the patient's sister, [name] (as per DPR). I informed her of the results of the study. She verbalized understanding of the findings and expressed appreciation for the call.

## 2022-07-03 NOTE — Telephone Encounter (Signed)
-----   Message from Garvin Fila, MD sent at 06/30/2022  4:25 PM EST ----- Kindly inform the patient that carotid ultrasound study showed no significant narrowing of either carotid artery in the neck.

## 2022-07-07 NOTE — Progress Notes (Signed)
Kindly inform the patient that carotid ultrasound study shows no significant stenosis of either carotid arteries in the neck

## 2022-07-18 ENCOUNTER — Telehealth: Payer: Self-pay | Admitting: Family Medicine

## 2022-07-18 NOTE — Telephone Encounter (Signed)
Medication Refill - Medication: atorvastatin (LIPITOR) 20 MG tablet [id number] amLODipine (NORVASC) 10 MG tablet [id number] omeprazole (PRILOSEC) 20 MG capsule [id number]   Has the patient contacted their pharmacy? Yes.     (Agent: If yes, when and what did the pharmacy advise?) Contact provider   Preferred Pharmacy (with phone number or street name): [facility] ([facility] [place]   Has the patient been seen for an appointment in the last year OR does the patient have an upcoming appointment? Yes.    Agent: Please be advised that RX refills may take up to 3 business days. We ask that you follow-up with your pharmacy.

## 2022-07-18 NOTE — Telephone Encounter (Signed)
Patient needs an appointment with PCP before medication refills. Call placed to patient unable to reach .

## 2022-07-21 ENCOUNTER — Other Ambulatory Visit: Payer: Self-pay

## 2022-07-21 DIAGNOSIS — E785 Hyperlipidemia, unspecified: Secondary | ICD-10-CM

## 2022-07-21 DIAGNOSIS — K219 Gastro-esophageal reflux disease without esophagitis: Secondary | ICD-10-CM

## 2022-07-21 DIAGNOSIS — I10 Essential (primary) hypertension: Secondary | ICD-10-CM

## 2022-07-21 MED ORDER — OMEPRAZOLE 20 MG PO CPDR: 20.0000 mg | DELAYED_RELEASE_CAPSULE | Freq: Every day | ORAL | 0 refills | Status: DC

## 2022-07-21 MED ORDER — ATORVASTATIN CALCIUM 20 MG PO TABS: 20.0000 mg | ORAL_TABLET | Freq: Every day | ORAL | 1 refills | Status: DC

## 2022-07-21 MED ORDER — LISINOPRIL 20 MG PO TABS: 20.0000 mg | ORAL_TABLET | Freq: Every day | ORAL | 1 refills | Status: DC

## 2022-07-21 MED ORDER — AMLODIPINE BESYLATE 10 MG PO TABS: 10.0000 mg | ORAL_TABLET | Freq: Every day | ORAL | 1 refills | Status: DC

## 2022-07-21 NOTE — Telephone Encounter (Signed)
Patients Sister [name] called asking for  atorvastatin (LIPITOR) 20 MG tablet amLODipine (NORVASC) 10 MG tablet omeprazole (PRILOSEC) 20 MG capsule  lisinopril (ZESTRIL) 20 MG tablet    She aware that patient needs an appt before receiving medication. She is asking if the patient can have a sooner appt. Or possibly a short supply of meds until his appt.

## 2022-07-21 NOTE — Telephone Encounter (Signed)
Spoke to pt sister [name], refill completed pt will keep appt on 04/10

## 2022-08-16 ENCOUNTER — Ambulatory Visit (INDEPENDENT_AMBULATORY_CARE_PROVIDER_SITE_OTHER): Payer: Medicaid Other | Admitting: Family Medicine

## 2022-08-16 ENCOUNTER — Encounter: Payer: Self-pay | Admitting: Family Medicine

## 2022-08-16 VITALS — BP 128/72 | HR 94 | Temp 98.1°F | Resp 16 | Wt 161.4 lb

## 2022-08-16 DIAGNOSIS — I10 Essential (primary) hypertension: Secondary | ICD-10-CM

## 2022-08-16 DIAGNOSIS — K219 Gastro-esophageal reflux disease without esophagitis: Secondary | ICD-10-CM | POA: Diagnosis not present

## 2022-08-16 DIAGNOSIS — E785 Hyperlipidemia, unspecified: Secondary | ICD-10-CM

## 2022-08-16 MED ORDER — LISINOPRIL 20 MG PO TABS: 20.0000 mg | ORAL_TABLET | Freq: Every day | ORAL | 1 refills | Status: DC

## 2022-08-16 MED ORDER — AMLODIPINE BESYLATE 10 MG PO TABS: 10.0000 mg | ORAL_TABLET | Freq: Every day | ORAL | 1 refills | Status: DC

## 2022-08-16 MED ORDER — OMEPRAZOLE 20 MG PO CPDR: 20.0000 mg | DELAYED_RELEASE_CAPSULE | Freq: Every day | ORAL | 1 refills | Status: DC

## 2022-08-16 MED ORDER — ATORVASTATIN CALCIUM 20 MG PO TABS: 20.0000 mg | ORAL_TABLET | Freq: Every day | ORAL | 1 refills | Status: DC

## 2022-08-16 NOTE — Progress Notes (Signed)
Patient is here for their 6 month follow-up Patient has no concerns today Care gaps have been discussed with patient  

## 2022-08-21 ENCOUNTER — Encounter: Payer: Self-pay | Admitting: Family Medicine

## 2022-08-21 NOTE — Progress Notes (Signed)
Established Patient Office Visit  Subjective    Patient ID: Christopher [name], male    DOB: 12-10-1965  Age: 57 y.o. MRN: [id number]  CC:  Chief Complaint  Patient presents with   Follow-up   Hypertension    HPI Christopher [name] presents for routine follow up of chronic med issues. Patient denies acute complaints or concerns.    Outpatient Encounter Medications as of 08/16/2022  Medication Sig   acetaminophen (TYLENOL) 325 MG tablet Take 1-2 tablets (325-650 mg total) by mouth every 4 (four) hours as needed for mild pain.   aspirin EC 325 MG tablet Take 1 tablet (325 mg total) by mouth daily.   Blood Pressure Monitoring (BLOOD PRESSURE CUFF) MISC Use to take blood pressure 2 times a day   [DISCONTINUED] amLODipine (NORVASC) 10 MG tablet Take 1 tablet (10 mg total) by mouth daily.   [DISCONTINUED] atorvastatin (LIPITOR) 20 MG tablet Take 1 tablet (20 mg total) by mouth daily.   [DISCONTINUED] lisinopril (ZESTRIL) 20 MG tablet Take 1 tablet (20 mg total) by mouth daily.   [DISCONTINUED] omeprazole (PRILOSEC) 20 MG capsule Take 1 capsule (20 mg total) by mouth daily.   amLODipine (NORVASC) 10 MG tablet Take 1 tablet (10 mg total) by mouth daily.   atorvastatin (LIPITOR) 20 MG tablet Take 1 tablet (20 mg total) by mouth daily.   lisinopril (ZESTRIL) 20 MG tablet Take 1 tablet (20 mg total) by mouth daily.   omeprazole (PRILOSEC) 20 MG capsule Take 1 capsule (20 mg total) by mouth daily.   No facility-administered encounter medications on file as of 08/16/2022.    Past Medical History:  Diagnosis Date   PUD (peptic ulcer disease) 2008   treated with meds    No past surgical history on file.  Family History  Problem Relation Age of Onset   Cancer Mother    Stroke Father     Social History   Socioeconomic History   Marital status: Divorced    Spouse name: Not on file   Number of children: Not on file   Years of education: Not on file   Highest education level: Not on  file  Occupational History   Not on file  Tobacco Use   Smoking status: Every Day    Packs/day: .5    Types: Cigarettes   Smokeless tobacco: Former  Building services engineer Use: Never used  Substance and Sexual Activity   Alcohol use: Yes    Comment: 2-3 "sparks" a day   Drug use: Yes    Types: "Crack" cocaine    Comment: "only every once in awhile"   Sexual activity: Not Currently  Other Topics Concern   Not on file  Social History Narrative   Not on file   Social Determinants of Health   Financial Resource Strain: Medium Risk (08/16/2022)   Overall Financial Resource Strain (CARDIA)    Difficulty of Paying Living Expenses: Somewhat hard  Food Insecurity: No Food Insecurity (08/16/2022)   Hunger Vital Sign    Worried About Running Out of Food in the Last Year: Never true    Ran Out of Food in the Last Year: Never true  Transportation Needs: No Transportation Needs (08/16/2022)   PRAPARE - Administrator, Civil Service (Medical): No    Lack of Transportation (Non-Medical): No  Physical Activity: Sufficiently Active (08/16/2022)   Exercise Vital Sign    Days of Exercise per Week: 7 days    Minutes  of Exercise per Session: 30 min  Stress: No Stress Concern Present (08/16/2022)   [employer] of Occupational Health - Occupational Stress Questionnaire    Feeling of Stress : Only a little  Social Connections: Socially Isolated (08/16/2022)   Social Connection and Isolation Panel [NHANES]    Frequency of Communication with Friends and Family: More than three times a week    Frequency of Social Gatherings with Friends and Family: More than three times a week    Attends Religious Services: Never    Database administrator or Organizations: No    Attends Banker Meetings: Never    Marital Status: Separated  Intimate Partner Violence: Not At Risk (08/16/2022)   Humiliation, Afraid, Rape, and Kick questionnaire    Fear of Current or Ex-Partner: No     Emotionally Abused: No    Physically Abused: No    Sexually Abused: No    Review of Systems  All other systems reviewed and are negative.       Objective    BP 128/72   Pulse 94   Temp 98.1 F (36.7 C) (Oral)   Resp 16   Wt 161 lb 6.4 oz (73.2 kg)   SpO2 98%   BMI 23.83 kg/m   Physical Exam Vitals and nursing note reviewed.  Constitutional:      General: He is not in acute distress. Cardiovascular:     Rate and Rhythm: Normal rate and regular rhythm.  Pulmonary:     Effort: Pulmonary effort is normal.     Breath sounds: Normal breath sounds.  Abdominal:     Palpations: Abdomen is soft.     Tenderness: There is no abdominal tenderness.  Neurological:     General: No focal deficit present.     Mental Status: He is alert and oriented to person, place, and time.         Assessment & Plan:   1. Essential hypertension Appears stable. Continue. Meds refilled.  - amLODipine (NORVASC) 10 MG tablet; Take 1 tablet (10 mg total) by mouth daily.  Dispense: 90 tablet; Refill: 1 - lisinopril (ZESTRIL) 20 MG tablet; Take 1 tablet (20 mg total) by mouth daily.  Dispense: 90 tablet; Refill: 1  2. Gastroesophageal reflux disease without esophagitis Continue. Meds refilled.  - omeprazole (PRILOSEC) 20 MG capsule; Take 1 capsule (20 mg total) by mouth daily.  Dispense: 90 capsule; Refill: 1  3. Dyslipidemia Continue. Meds refilled.  - atorvastatin (LIPITOR) 20 MG tablet; Take 1 tablet (20 mg total) by mouth daily.  Dispense: 90 tablet; Refill: 1    Return in about 6 months (around 02/15/2023) for follow up.   Christopher Raymond, MD

## 2022-11-28 IMAGING — MR MR HEAD W/O CM
12 of 13 series · 44 of 48 positions shown · non-contrast
Comparison: Head CT x2 yesterday. Brain MRI 01/10/2021.

CLINICAL DATA: 56-year-old male with altered mental status. Code
stroke presentation yesterday.

EXAM:
MRI HEAD WITHOUT CONTRAST
TECHNIQUE: Multiplanar, multiecho pulse sequences of the brain and surrounding
structures were obtained without intravenous contrast.

[Series 9: DWI · axial · 3.0mm · 0.88mm/px · z∈[-128,+16]mm · 8 of 100 slices shown (1 of 4)]
[im 1/100]
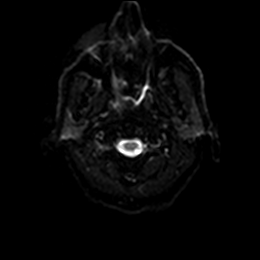
[im 15/100]
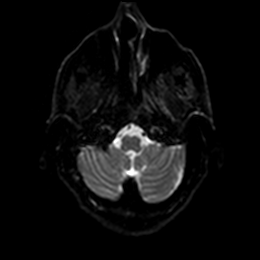
[im 29/100]
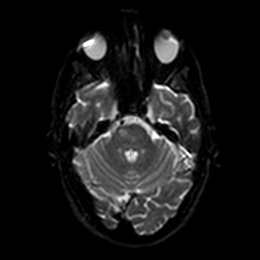
[im 43/100]
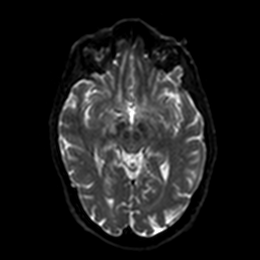
[im 57/100]
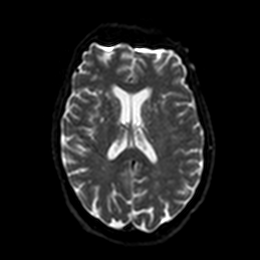
[im 71/100]
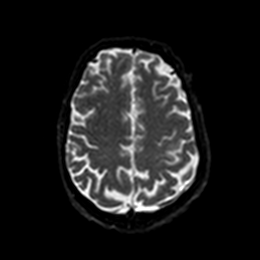
[im 85/100]
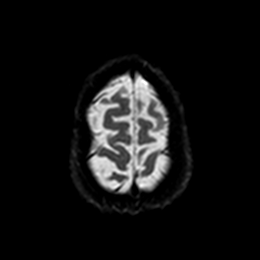
[im 100/100]
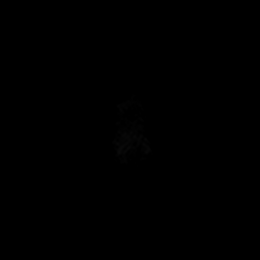

[Series 10: DWI · axial · 3.0mm · 0.88mm/px · z∈[-128,+16]mm · 4 of 48 slices shown (2 of 4)]
[im 1/48]
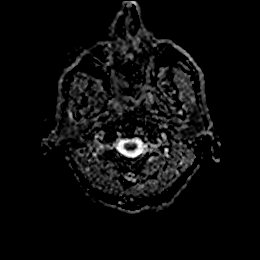
[im 16/48]
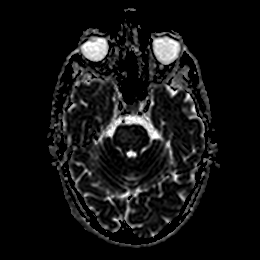
[im 32/48]
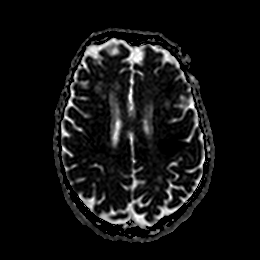
[im 48/48]
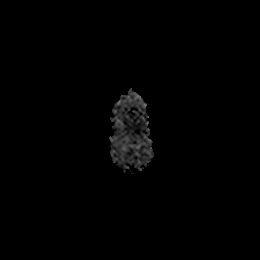

[Series 11: DWI · coronal · 4.0mm · 0.88mm/px · 6 of 78 slices shown (3 of 4)]
[im 1/78]
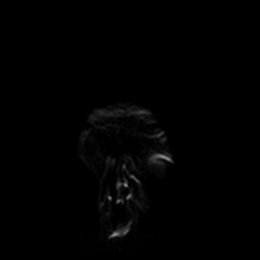
[im 16/78]
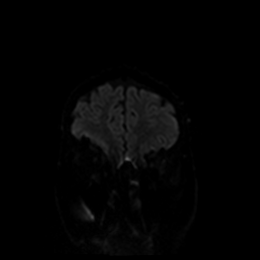
[im 31/78]
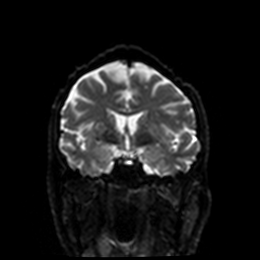
[im 47/78]
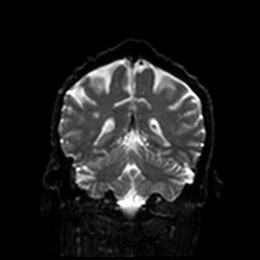
[im 62/78]
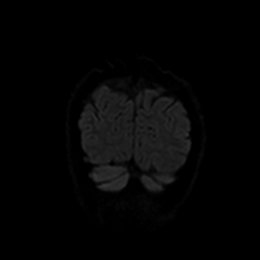
[im 78/78]
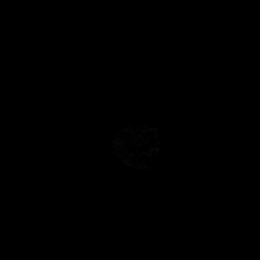

[Series 12: DWI · coronal · 4.0mm · 0.88mm/px · 3 of 39 slices shown (4 of 4)]
[im 1/39]
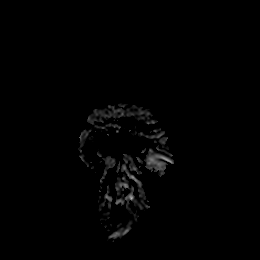
[im 20/39]
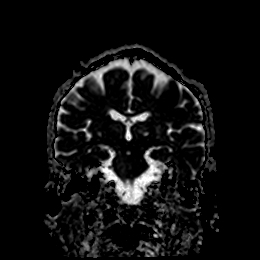
[im 39/39]
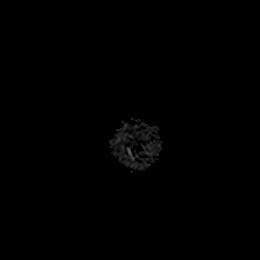

[Series 13: FLAIR · axial · 5.0mm · 0.45mm/px · z∈[-126,+21]mm · 2 of 26 slices shown]
[im 1/26]
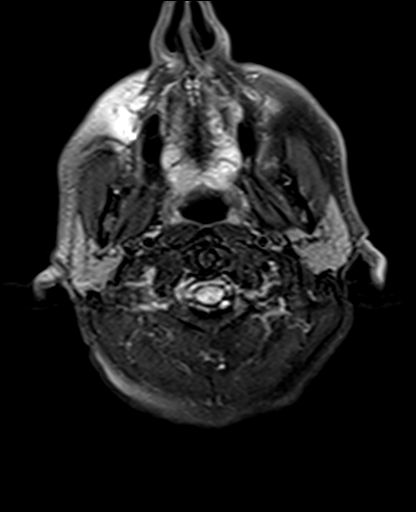
[im 26/26]
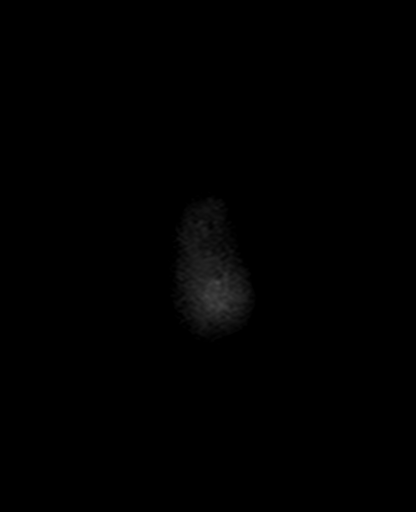

[Series 14: mag_images · axial · 3.0mm · 0.90mm/px · z∈[-127,+23]mm · 4 of 52 slices shown]
[im 1/52]
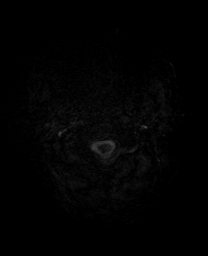
[im 18/52]
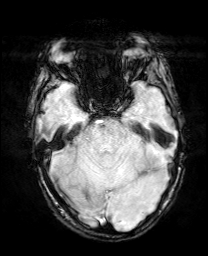
[im 35/52]
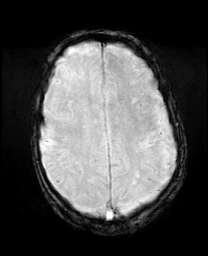
[im 52/52]
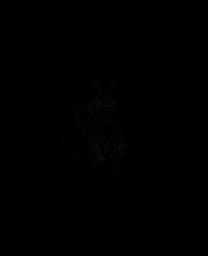

[Series 15: pha_images · axial · 3.0mm · 0.90mm/px · z∈[-127,+23]mm · 4 of 51 slices shown]
[im 1/51]
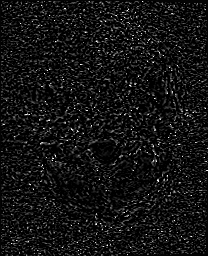
[im 17/51]
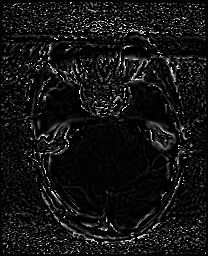
[im 34/51]
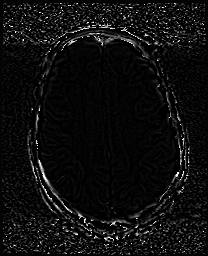
[im 51/51]
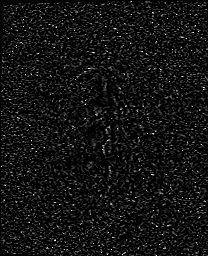

[Series 16: swi_images · axial · 3.0mm · 0.90mm/px · z∈[-127,+23]mm · 4 of 52 slices shown]
[im 1/52]
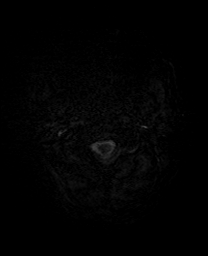
[im 18/52]
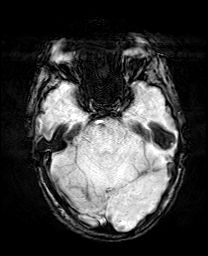
[im 35/52]
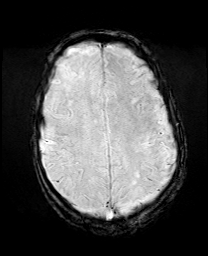
[im 52/52]
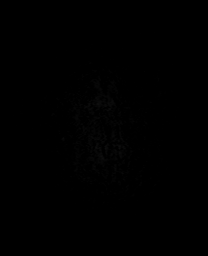

[Series 17: mip_images(sw) · axial · 24.0mm · 0.90mm/px · z∈[-117,+12]mm · 3 of 45 slices shown]
[im 1/45]
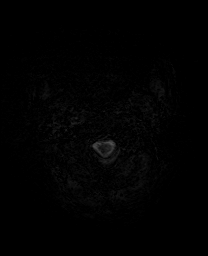
[im 23/45]
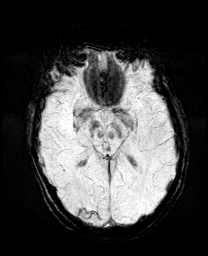
[im 45/45]
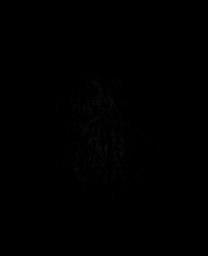

[Series 19: T2 · axial · 5.0mm · 0.72mm/px · z∈[-130,+17]mm · 2 of 26 slices shown (1 of 2)]
[im 1/26]
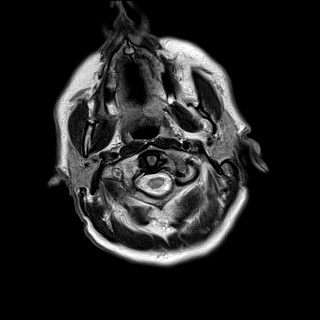
[im 26/26]
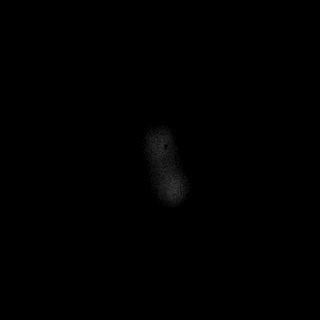

[Series 21: T1 · sagittal · 5.0mm · 0.75mm/px · 2 of 23 slices shown]
[im 1/23]
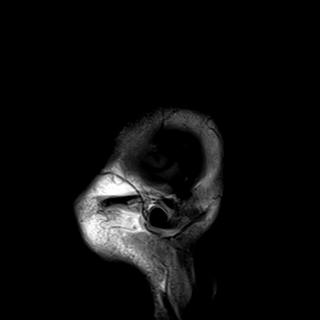
[im 23/23]
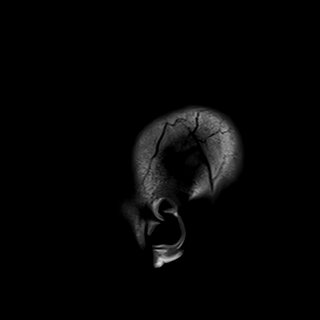

[Series 22: T2 · coronal · 5.0mm · 0.34mm/px · 2 of 32 slices shown (2 of 2)]
[im 1/32]
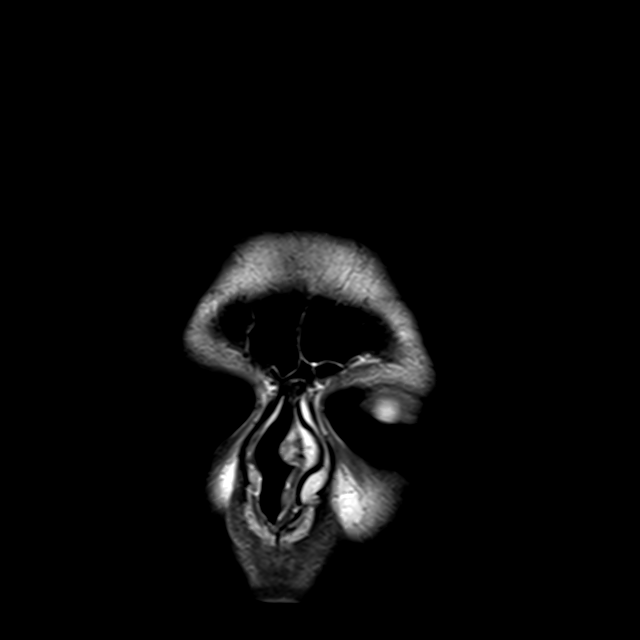
[im 32/32]
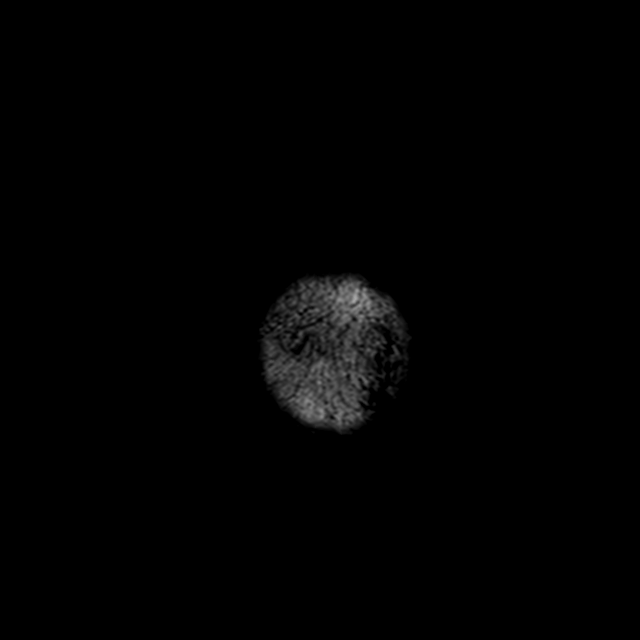

[44 of 48 positions shown; findings below may reference images not displayed]

FINDINGS: Brain: Roughly 3 cm area of cortical and subcortical white matter
restricted diffusion in the posterior left MCA territory affects the
perirolandic area on series 9, image 85 and series 11, image 58.
Associated mildly T2 and FLAIR hyperintense cytotoxic edema there.
And there is gyrus susceptibility on SWI at the motor strip on
series 16, image 38. Cannot exclude petechial hemorrhage. But no
regional mass effect.

No other diffusion restriction. But developing encephalomalacia in
the right occipital pole or is new since last year (series 9, image
76) with mild hemosiderin. Chronic bilateral deep gray matter nuclei
lacunar infarcts. Heterogeneous bilateral white matter involvement
in glued Ng some of the deep white matter capsules. And new
conspicuous right side brainstem Wallerian degeneration since last
year (series 19, image 9).

No superimposed midline shift, mass effect, evidence of mass lesion,
ventriculomegaly, extra-axial collection. Cervicomedullary junction
and pituitary are within normal limits.

Vascular: Major intracranial vascular flow voids are stable since
last year. Distal left vertebral artery appears dominant.

Skull and upper cervical spine: Partially visible cervical spine
degeneration. Visualized bone marrow signal is within normal limits.

Sinuses/Orbits: Stable, negative.

Other: Negative visible scalp and face.
IMPRESSION: 1. Acute posterior Left MCA, perirolandic infarct with possible
petechial hemorrhage, but no malignant hemorrhagic transformation or
mass effect.

2. Progressed right PCA territory ischemic disease since last year.
And new right brainstem Wallerian degeneration. Underlying advanced
chronic small vessel disease.

## 2022-12-19 ENCOUNTER — Ambulatory Visit: Payer: Self-pay

## 2022-12-19 NOTE — Telephone Encounter (Signed)
Chief Complaint: Bleeding Symptoms: rectal bleeding Frequency: comes and goes with bowel movements Pertinent Negatives: Patient denies constipation, diarrhea, straining Disposition: [] ED /[x] Urgent Care (no appt availability in office) / [] Appointment(In office/virtual)/ []  [facility]/ [] Home Care/ [] Refused Recommended Disposition /[] [facility]/ []  Follow-up with PCP Additional Notes: Patient stated that he has been experiencing rectal bleeding in the amount of spotting for years and never got it checked out. Patient stated that yesterday he had blood in his stool that was bright red and mixed into the stool. Patient described it as a moderate amount of blood. Patient reported that the same thing happened when he had a bowel movement today. Care advise given. No appointments were available in the office until the end of the month. Patient has been scheduled at [facility] tomorrow at  1300. Advised patient callback or go to the ED if symptoms get worse. Patient verbalized understanding.  Reason for Disposition  MILD rectal bleeding (more than just a few drops or streaks)  Answer Assessment - Initial Assessment Questions 1. APPEARANCE of BLOOD: "What color is it?" "Is it passed separately, on the surface of the stool, or mixed in with the stool?"      Mixed into the color bright red  2. AMOUNT: "How much blood was passed?"      I'm not sure but it looks like I filled the toilet up  3. FREQUENCY: "How many times has blood been passed with the stools?"      2 4. ONSET: "When was the blood first seen in the stools?" (Days or weeks)      Yesterday and today 5. DIARRHEA: "Is there also some diarrhea?" If Yes, ask: "How many diarrhea stools in the past 24 hours?"      No 6. CONSTIPATION: "Do you have constipation?" If Yes, ask: "How bad is it?"     No 7. RECURRENT SYMPTOMS: "Have you had blood in your stools before?" If Yes, ask: "When was the last time?" and "What happened that  time?"      Yes, but I didn't think anything about  8. BLOOD THINNERS: "Do you take any blood thinners?" (e.g., Coumadin/warfarin, Pradaxa/dabigatran, aspirin)     Aspirin 81mg   9. OTHER SYMPTOMS: "Do you have any other symptoms?"  (e.g., abdomen pain, vomiting, dizziness, fever)     No  Protocols used: Rectal Bleeding-A-AH

## 2022-12-20 ENCOUNTER — Ambulatory Visit: Payer: Self-pay

## 2023-02-15 ENCOUNTER — Encounter: Payer: Self-pay | Admitting: Family Medicine

## 2023-02-15 ENCOUNTER — Ambulatory Visit (INDEPENDENT_AMBULATORY_CARE_PROVIDER_SITE_OTHER): Payer: Medicaid Other | Admitting: Family Medicine

## 2023-02-15 VITALS — BP 130/74 | HR 89 | Temp 98.1°F | Resp 18 | Ht 69.0 in | Wt 159.0 lb

## 2023-02-15 DIAGNOSIS — I10 Essential (primary) hypertension: Secondary | ICD-10-CM | POA: Diagnosis not present

## 2023-02-15 DIAGNOSIS — E785 Hyperlipidemia, unspecified: Secondary | ICD-10-CM | POA: Diagnosis not present

## 2023-02-15 DIAGNOSIS — K625 Hemorrhage of anus and rectum: Secondary | ICD-10-CM

## 2023-02-15 MED ORDER — AMLODIPINE BESYLATE 10 MG PO TABS: 10.0000 mg | ORAL_TABLET | Freq: Every day | ORAL | 1 refills | Status: DC

## 2023-02-15 MED ORDER — ATORVASTATIN CALCIUM 20 MG PO TABS: 20.0000 mg | ORAL_TABLET | Freq: Every day | ORAL | 1 refills | Status: DC

## 2023-02-15 MED ORDER — LISINOPRIL 20 MG PO TABS: 20.0000 mg | ORAL_TABLET | Freq: Every day | ORAL | 1 refills | Status: DC

## 2023-02-19 ENCOUNTER — Encounter: Payer: Self-pay | Admitting: Family Medicine

## 2023-02-19 NOTE — Progress Notes (Signed)
Established Patient Office Visit  Subjective    Patient ID: Christopher [name], male    DOB: [birth year]  Age: 57 y.o. MRN: [id number]  CC:  Chief Complaint  Patient presents with   Follow-up    HPI JAKEVIOUS HOLLISTER presents for follow up of chronic med issues. Patient also reports some blood in his stool.   Outpatient Encounter Medications as of 02/15/2023  Medication Sig   acetaminophen (TYLENOL) 325 MG tablet Take 1-2 tablets (325-650 mg total) by mouth every 4 (four) hours as needed for mild pain.   aspirin EC 325 MG tablet Take 1 tablet (325 mg total) by mouth daily.   Blood Pressure Monitoring (BLOOD PRESSURE CUFF) MISC Use to take blood pressure 2 times a day   [DISCONTINUED] amLODipine (NORVASC) 10 MG tablet Take 1 tablet (10 mg total) by mouth daily.   [DISCONTINUED] atorvastatin (LIPITOR) 20 MG tablet Take 1 tablet (20 mg total) by mouth daily.   [DISCONTINUED] lisinopril (ZESTRIL) 20 MG tablet Take 1 tablet (20 mg total) by mouth daily.   amLODipine (NORVASC) 10 MG tablet Take 1 tablet (10 mg total) by mouth daily.   atorvastatin (LIPITOR) 20 MG tablet Take 1 tablet (20 mg total) by mouth daily.   lisinopril (ZESTRIL) 20 MG tablet Take 1 tablet (20 mg total) by mouth daily.   omeprazole (PRILOSEC) 20 MG capsule Take 1 capsule (20 mg total) by mouth daily.   No facility-administered encounter medications on file as of 02/15/2023.    Past Medical History:  Diagnosis Date   PUD (peptic ulcer disease) 2008   treated with meds    History reviewed. No pertinent surgical history.  Family History  Problem Relation Age of Onset   Cancer Mother    Stroke Father     Social History   Socioeconomic History   Marital status: Divorced    Spouse name: Not on file   Number of children: Not on file   Years of education: Not on file   Highest education level: Not on file  Occupational History   Not on file  Tobacco Use   Smoking status: Every Day    Current packs/day:  0.50    Types: Cigarettes   Smokeless tobacco: Former  Building services engineer status: Never Used  Substance and Sexual Activity   Alcohol use: Yes    Comment: 2-3 "sparks" a day   Drug use: Yes    Types: "Crack" cocaine    Comment: "only every once in awhile"   Sexual activity: Not Currently  Other Topics Concern   Not on file  Social History Narrative   Not on file   Social Determinants of Health   Financial Resource Strain: Medium Risk (08/16/2022)   Overall Financial Resource Strain (CARDIA)    Difficulty of Paying Living Expenses: Somewhat hard  Food Insecurity: No Food Insecurity (08/16/2022)   Hunger Vital Sign    Worried About Running Out of Food in the Last Year: Never true    Ran Out of Food in the Last Year: Never true  Transportation Needs: No Transportation Needs (08/16/2022)   PRAPARE - Administrator, Civil Service (Medical): No    Lack of Transportation (Non-Medical): No  Physical Activity: Sufficiently Active (08/16/2022)   Exercise Vital Sign    Days of Exercise per Week: 7 days    Minutes of Exercise per Session: 30 min  Stress: No Stress Concern Present (02/15/2023)   [employer] of Occupational  Health - Occupational Stress Questionnaire    Feeling of Stress : Not at all  Social Connections: Socially Isolated (08/16/2022)   Social Connection and Isolation Panel [NHANES]    Frequency of Communication with Friends and Family: More than three times a week    Frequency of Social Gatherings with Friends and Family: More than three times a week    Attends Religious Services: Never    Database administrator or Organizations: No    Attends Banker Meetings: Never    Marital Status: Separated  Intimate Partner Violence: Not At Risk (08/16/2022)   Humiliation, Afraid, Rape, and Kick questionnaire    Fear of Current or Ex-Partner: No    Emotionally Abused: No    Physically Abused: No    Sexually Abused: No    Review of Systems   Gastrointestinal:  Positive for blood in stool.  All other systems reviewed and are negative.       Objective    BP 130/74 (BP Location: Right Arm, Patient Position: Sitting, Cuff Size: Normal)   Pulse 89   Temp 98.1 F (36.7 C) (Oral)   Resp 18   Ht 5\' 9"  (1.753 m)   Wt 159 lb (72.1 kg)   SpO2 93%   BMI 23.48 kg/m   Physical Exam Vitals and nursing note reviewed.  Constitutional:      General: He is not in acute distress. Cardiovascular:     Rate and Rhythm: Normal rate and regular rhythm.  Pulmonary:     Effort: Pulmonary effort is normal.     Breath sounds: Normal breath sounds.  Abdominal:     Palpations: Abdomen is soft.     Tenderness: There is no abdominal tenderness.  Neurological:     General: No focal deficit present.     Mental Status: He is alert and oriented to person, place, and time.         Assessment & Plan:   Rectal bleeding -     Ambulatory referral to Gastroenterology  Essential hypertension -     amLODIPine Besylate; Take 1 tablet (10 mg total) by mouth daily.  Dispense: 90 tablet; Refill: 1 -     Lisinopril; Take 1 tablet (20 mg total) by mouth daily.  Dispense: 90 tablet; Refill: 1  Dyslipidemia -     Atorvastatin Calcium; Take 1 tablet (20 mg total) by mouth daily.  Dispense: 90 tablet; Refill: 1     No follow-ups on file.   [name], MD

## 2023-02-22 ENCOUNTER — Encounter: Payer: Self-pay | Admitting: Gastroenterology

## 2023-03-19 ENCOUNTER — Encounter: Payer: Self-pay | Admitting: Family Medicine

## 2023-03-19 ENCOUNTER — Ambulatory Visit (INDEPENDENT_AMBULATORY_CARE_PROVIDER_SITE_OTHER): Payer: Medicaid Other | Admitting: Family Medicine

## 2023-03-19 VITALS — BP 120/71 | HR 89 | Temp 98.4°F | Resp 16 | Ht 69.0 in | Wt 164.0 lb

## 2023-03-19 DIAGNOSIS — K625 Hemorrhage of anus and rectum: Secondary | ICD-10-CM | POA: Diagnosis not present

## 2023-03-19 DIAGNOSIS — K219 Gastro-esophageal reflux disease without esophagitis: Secondary | ICD-10-CM | POA: Diagnosis not present

## 2023-03-20 LAB — CBC WITH DIFFERENTIAL/PLATELET
Basophils Absolute: 0.1 10*3/uL (ref 0.0–0.2)
Basos: 1 %
EOS (ABSOLUTE): 0.2 10*3/uL (ref 0.0–0.4)
Eos: 2 %
Hematocrit: 41 % (ref 37.5–51.0)
Hemoglobin: 13.1 g/dL (ref 13.0–17.7)
Immature Grans (Abs): 0.1 10*3/uL (ref 0.0–0.1)
Immature Granulocytes: 1 %
Lymphocytes Absolute: 2 10*3/uL (ref 0.7–3.1)
Lymphs: 19 %
MCH: 28 pg (ref 26.6–33.0)
MCHC: 32 g/dL (ref 31.5–35.7)
MCV: 88 fL (ref 79–97)
Monocytes Absolute: 0.8 10*3/uL (ref 0.1–0.9)
Monocytes: 8 %
Neutrophils Absolute: 7.2 10*3/uL — ABNORMAL HIGH (ref 1.4–7.0)
Neutrophils: 69 %
Platelets: 326 10*3/uL (ref 150–450)
RBC: 4.68 x10E6/uL (ref 4.14–5.80)
RDW: 11.8 % (ref 11.6–15.4)
WBC: 10.3 10*3/uL (ref 3.4–10.8)

## 2023-03-20 LAB — BASIC METABOLIC PANEL
BUN/Creatinine Ratio: 11 (ref 9–20)
BUN: 13 mg/dL (ref 6–24)
CO2: 23 mmol/L (ref 20–29)
Calcium: 9.7 mg/dL (ref 8.7–10.2)
Chloride: 101 mmol/L (ref 96–106)
Creatinine, Ser: 1.22 mg/dL (ref 0.76–1.27)
Glucose: 94 mg/dL (ref 70–99)
Potassium: 4.6 mmol/L (ref 3.5–5.2)
Sodium: 140 mmol/L (ref 134–144)
eGFR: 69 mL/min/{1.73_m2} (ref >59)

## 2023-03-21 ENCOUNTER — Encounter: Payer: Self-pay | Admitting: Family Medicine

## 2023-03-21 NOTE — Progress Notes (Signed)
Established Patient Office Visit  Subjective    Patient ID: Christopher [name], male    DOB: May 17, 1965  Age: 57 y.o. MRN: [id number]  CC:  Chief Complaint  Patient presents with   Follow-up    HPI Christopher [name] presents with complaint of persistent blood in stool. He does have an appt set up with GI for further eval//mgt.   Outpatient Encounter Medications as of 03/19/2023  Medication Sig   acetaminophen (TYLENOL) 325 MG tablet Take 1-2 tablets (325-650 mg total) by mouth every 4 (four) hours as needed for mild pain.   amLODipine (NORVASC) 10 MG tablet Take 1 tablet (10 mg total) by mouth daily.   aspirin EC 325 MG tablet Take 1 tablet (325 mg total) by mouth daily.   atorvastatin (LIPITOR) 20 MG tablet Take 1 tablet (20 mg total) by mouth daily.   Blood Pressure Monitoring (BLOOD PRESSURE CUFF) MISC Use to take blood pressure 2 times a day   lisinopril (ZESTRIL) 20 MG tablet Take 1 tablet (20 mg total) by mouth daily.   omeprazole (PRILOSEC) 20 MG capsule Take 1 capsule (20 mg total) by mouth daily.   No facility-administered encounter medications on file as of 03/19/2023.    Past Medical History:  Diagnosis Date   PUD (peptic ulcer disease) 2008   treated with meds    History reviewed. No pertinent surgical history.  Family History  Problem Relation Age of Onset   Cancer Mother    Stroke Father     Social History   Socioeconomic History   Marital status: Divorced    Spouse name: Not on file   Number of children: Not on file   Years of education: Not on file   Highest education level: Not on file  Occupational History   Not on file  Tobacco Use   Smoking status: Every Day    Current packs/day: 0.50    Types: Cigarettes   Smokeless tobacco: Former  Building services engineer status: Never Used  Substance and Sexual Activity   Alcohol use: Yes    Comment: 2-3 "sparks" a day   Drug use: Yes    Types: "Crack" cocaine    Comment: "only every once in awhile"    Sexual activity: Not Currently  Other Topics Concern   Not on file  Social History Narrative   Not on file   Social Determinants of Health   Financial Resource Strain: Medium Risk (08/16/2022)   Overall Financial Resource Strain (CARDIA)    Difficulty of Paying Living Expenses: Somewhat hard  Food Insecurity: No Food Insecurity (08/16/2022)   Hunger Vital Sign    Worried About Running Out of Food in the Last Year: Never true    Ran Out of Food in the Last Year: Never true  Transportation Needs: No Transportation Needs (08/16/2022)   PRAPARE - Administrator, Civil Service (Medical): No    Lack of Transportation (Non-Medical): No  Physical Activity: Sufficiently Active (08/16/2022)   Exercise Vital Sign    Days of Exercise per Week: 7 days    Minutes of Exercise per Session: 30 min  Stress: No Stress Concern Present (02/15/2023)   [employer] of Occupational Health - Occupational Stress Questionnaire    Feeling of Stress : Not at all  Social Connections: Socially Isolated (08/16/2022)   Social Connection and Isolation Panel [NHANES]    Frequency of Communication with Friends and Family: More than three times a week  Frequency of Social Gatherings with Friends and Family: More than three times a week    Attends Religious Services: Never    Database administrator or Organizations: No    Attends Banker Meetings: Never    Marital Status: Separated  Intimate Partner Violence: Not At Risk (08/16/2022)   Humiliation, Afraid, Rape, and Kick questionnaire    Fear of Current or Ex-Partner: No    Emotionally Abused: No    Physically Abused: No    Sexually Abused: No    Review of Systems  Gastrointestinal:  Positive for blood in stool.  All other systems reviewed and are negative.       Objective    BP 120/71 (BP Location: Right Arm, Patient Position: Sitting, Cuff Size: Normal)   Pulse 89   Temp 98.4 F (36.9 C) (Oral)   Resp 16   Ht 5\' 9"   (1.753 m)   Wt 164 lb (74.4 kg)   SpO2 95%   BMI 24.22 kg/m   Physical Exam Vitals and nursing note reviewed.  Constitutional:      General: He is not in acute distress. Cardiovascular:     Rate and Rhythm: Normal rate and regular rhythm.  Pulmonary:     Effort: Pulmonary effort is normal.     Breath sounds: Normal breath sounds.  Abdominal:     Palpations: Abdomen is soft.     Tenderness: There is no abdominal tenderness.  Neurological:     General: No focal deficit present.     Mental Status: He is alert and oriented to person, place, and time.         Assessment & Plan:   Rectal bleeding -     Basic metabolic panel -     CBC with Differential/Platelet  Gastroesophageal reflux disease without esophagitis     Return if symptoms worsen or fail to improve.   Christopher Raymond, MD

## 2023-05-10 ENCOUNTER — Encounter: Payer: Self-pay | Admitting: Neurology

## 2023-05-10 ENCOUNTER — Telehealth: Payer: Self-pay | Admitting: Neurology

## 2023-05-10 NOTE — Telephone Encounter (Signed)
 LVM and sent letter in mail informing pt of need to reschedule 06/14/23 appt - MD out

## 2023-05-29 ENCOUNTER — Encounter: Payer: Self-pay | Admitting: Gastroenterology

## 2023-05-29 ENCOUNTER — Ambulatory Visit: Payer: Medicaid Other | Admitting: Gastroenterology

## 2023-05-29 VITALS — BP 132/70 | HR 94 | Ht 69.0 in | Wt 160.0 lb

## 2023-05-29 DIAGNOSIS — Z8673 Personal history of transient ischemic attack (TIA), and cerebral infarction without residual deficits: Secondary | ICD-10-CM | POA: Diagnosis not present

## 2023-05-29 DIAGNOSIS — I10 Essential (primary) hypertension: Secondary | ICD-10-CM

## 2023-05-29 DIAGNOSIS — Z720 Tobacco use: Secondary | ICD-10-CM | POA: Diagnosis not present

## 2023-05-29 DIAGNOSIS — Z8711 Personal history of peptic ulcer disease: Secondary | ICD-10-CM | POA: Diagnosis not present

## 2023-05-29 DIAGNOSIS — K625 Hemorrhage of anus and rectum: Secondary | ICD-10-CM | POA: Diagnosis not present

## 2023-05-29 DIAGNOSIS — K921 Melena: Secondary | ICD-10-CM

## 2023-05-29 NOTE — Progress Notes (Signed)
HPI : Christopher [name] is a 58 y.o. male with a history of stroke and chronic tobacco use who is referred to [place] by Georganna Skeans, MD for further evaluation of hematochezia.  The patient first noticed the bleeding in 2022, but it became more significant around July or August of 2024. The bleeding is described as bright red and is present most of the time, turning the toilet water red. The patient reports seeing blood in the stool, in the water, and on the toilet paper. Occasionally, the patient experiences a sensation of warmth and blood dripping from the anus after bowel movements.  The patient also reports discomfort lasting about ten minutes after bowel movements, described as not severe but irritating. Bowel habits are generally regular, with stools being soft most of the time. The patient denies constipation or diarrhea, typically having a bowel movement once a day, sometimes skipping a day.  The patient has lost about four to five pounds recently but attributes this to not eating a lot.  The patient occasionally experiences abdominal pain, but it is not consistent and does not occur daily.  The patient has a history of two strokes, one in September 2022 and another in May 2023. The second stroke resulted in loss of sensation in the right hand, which has not returned. The patient also reports difficulty with speech, but is able to drink regular liquids without coughing or choking.  He had an echocardiogram at that time which showed normal LV function.  No suspicion for embolic source.  He also had recent carotid ultrasounds which revealed no significant carotid artery stenosis.  The patient has a history of crack cocaine use, but has been clean for two years. The patient is currently taking a baby aspirin daily for secondary stroke prevention and 2 antihypertensives.   The patient denies any heart problems or symptoms such as chest pain or pressure, shortness of breath, palpitations or  lightheadedness/dizziness.  He also denies any symptoms from chronic tobacco use such as chronic cough, wheezing, phlegm production or shortness of breath.  He reports a history of stomach ulcer with perforation in 2008.  He was admitted to the hospital for about a week.  He does not think he had surgery.     Past Medical History:  Diagnosis Date   PUD (peptic ulcer disease) 2008   treated with meds   Stroke [facility])     Past Surgical History:  Procedure Laterality Date   rupture ulcer     Family History  Problem Relation Age of Onset   Cancer Mother    Stroke Father    Social History   Tobacco Use   Smoking status: Every Day    Current packs/day: 0.50    Types: Cigarettes   Smokeless tobacco: Former  Building services engineer status: Never Used  Substance Use Topics   Alcohol use: Yes    Comment: 2-3 "sparks" a day   Drug use: Yes    Types: "Crack" cocaine    Comment: "only every once in awhile"   Current Outpatient Medications  Medication Sig Dispense Refill   amLODipine (NORVASC) 10 MG tablet Take 1 tablet (10 mg total) by mouth daily. 90 tablet 1   aspirin EC 325 MG tablet Take 1 tablet (325 mg total) by mouth daily. 1 tablet 1   atorvastatin (LIPITOR) 20 MG tablet Take 1 tablet (20 mg total) by mouth daily. 90 tablet 1   Blood Pressure Monitoring (BLOOD PRESSURE CUFF) MISC  Use to take blood pressure 2 times a day 1 each 0   lisinopril (ZESTRIL) 20 MG tablet Take 1 tablet (20 mg total) by mouth daily. 90 tablet 1   acetaminophen (TYLENOL) 325 MG tablet Take 1-2 tablets (325-650 mg total) by mouth every 4 (four) hours as needed for mild pain. (Patient not taking: Reported on 05/29/2023)     omeprazole (PRILOSEC) 20 MG capsule Take 1 capsule (20 mg total) by mouth daily. 90 capsule 1   No current facility-administered medications for this visit.   No Known Allergies   Review of Systems: All systems reviewed and negative except where noted in HPI.    No results  found.  Physical Exam: BP 132/70   Pulse 94   Ht 5\' 9"  (1.753 m)   Wt 160 lb (72.6 kg)   BMI 23.63 kg/m  Constitutional: Pleasant,well-developed, Caucasian male in no acute distress. HEENT: Normocephalic and atraumatic. Conjunctivae are normal. No scleral icterus.  Very poor dentition.  Mostly edentulous.  Two lower incisors remain, and very poor condition Neck supple.  Cardiovascular: Normal rate, regular rhythm.  Pulmonary/chest: Effort normal and breath sounds normal.  Faint rhonchi bilaterally.  No wheezes. Abdominal: Soft, nondistended, nontender. Bowel sounds active throughout. There are no masses palpable. No hepatomegaly.  Laparoscopy port scars present. Extremities: no edema Lymphadenopathy: No cervical adenopathy noted. Neurological: Alert and oriented to person place and time.  Dysarthria noted Skin: Skin is warm and dry. No rashes noted. Psychiatric: Normal mood and affect. Behavior is normal.  CBC    Component Value Date/Time   WBC 10.3 03/19/2023 0000   WBC 8.9 09/19/2021 0807   RBC 4.68 03/19/2023 0000   RBC 4.62 09/19/2021 0807   HGB 13.1 03/19/2023 0000   HCT 41.0 03/19/2023 0000   PLT 326 03/19/2023 0000   MCV 88 03/19/2023 0000   MCH 28.0 03/19/2023 0000   MCH 29.9 09/19/2021 0807   MCHC 32.0 03/19/2023 0000   MCHC 33.7 09/19/2021 0807   RDW 11.8 03/19/2023 0000   LYMPHSABS 2.0 03/19/2023 0000   MONOABS 0.7 09/15/2021 0534   EOSABS 0.2 03/19/2023 0000   BASOSABS 0.1 03/19/2023 0000    CMP     Component Value Date/Time   NA 140 03/19/2023 0000   K 4.6 03/19/2023 0000   CL 101 03/19/2023 0000   CO2 23 03/19/2023 0000   GLUCOSE 94 03/19/2023 0000   GLUCOSE 112 (H) 09/19/2021 0807   BUN 13 03/19/2023 0000   CREATININE 1.22 03/19/2023 0000   CALCIUM 9.7 03/19/2023 0000   PROT 6.5 09/15/2021 0534   PROT 7.2 08/10/2021 1538   ALBUMIN 3.4 (L) 09/15/2021 0534   ALBUMIN 4.9 08/10/2021 1538   AST 12 (L) 09/15/2021 0534   ALT 11 09/15/2021 0534    ALKPHOS 56 09/15/2021 0534   BILITOT 0.6 09/15/2021 0534   BILITOT 0.2 08/10/2021 1538   GFRNONAA >60 09/19/2021 0807   GFRAA >90 11/15/2012 1044       Latest Ref Rng & Units 03/19/2023   12:00 AM 09/19/2021    8:07 AM 09/15/2021    5:34 AM  CBC EXTENDED  WBC 3.4 - 10.8 x10E3/uL 10.3  8.9  7.8   RBC 4.14 - 5.80 x10E6/uL 4.68  4.62  4.14   Hemoglobin 13.0 - 17.7 g/dL 16.1  09.6  04.5   HCT 37.5 - 51.0 % 41.0  41.0  36.3   Platelets 150 - 450 x10E3/uL 326  376  298   NEUT# 1.4 -  7.0 x10E3/uL 7.2   4.9   Lymph# 0.7 - 3.1 x10E3/uL 2.0   1.9       ASSESSMENT AND PLAN:  58 year old male with history of 2 CVAs, history of cocaine abuse in remission, chronic tobacco use, active, with 2 years of rectal bleeding with no previous colonoscopy.  No other concerning symptoms.  Suspect rectal bleeding is secondary to hemorrhoids, but colonoscopy indicated.  Rectal Bleeding Chronic rectal bleeding since 2022, with increased severity since mid-2024. Bright red blood noted in stool, toilet water, and on toilet paper. Differential diagnosis includes internal hemorrhoids, colorectal cancer, or polyps. Blood counts are stable, reducing immediate concern for severe anemia. Colonoscopy is necessary to rule out colorectal cancer or polyps. Discussed the procedure, including the use of propofol for sedation, the insertion of a fiber optic camera through the rectum, and the removal of any polyps found. Explained that the procedure is quick (about 20 minutes) and generally safe, with risks primarily associated with sedation. Emphasized the importance of a liquid diet the day before and the consumption of a bowel prep solution to clear the colon. - Schedule colonoscopy - Continue baby aspirin, including the day of the procedure  Peptic Ulcer Disease Reported perforated ulcer in 2008.  Patient denies having surgery, but laparoscopy scars noted on physical exam.  No current symptoms or complications noted. -  Monitor for new gastrointestinal symptoms  History of stroke with residual deficit Two strokes in September 2022 and May 2023, resulting in right-sided numbness and loss of function in the right hand. No sensation in the right hand and some speech difficulties. No current issues with swallowing.  Blood pressure controlled.  On aspirin for secondary prophylaxis - Continue current medications for stroke prevention - Monitor for new neurological symptoms  Tobacco Use Current smoker, reduced from a pack a day to a few cigarettes a day. No significant respiratory symptoms reported.  Faint rhonchi noted bilaterally - Encourage smoking cessation - Monitor for respiratory symptoms  The details, risks (including bleeding, perforation, infection, missed lesions, medication reactions and possible hospitalization or surgery if complications occur), benefits, and alternatives to colonoscopy with possible biopsy and possible polypectomy were discussed with the patient and he consents to proceed.    Laiken Sandy E. Tomasa Rand, MD [facility]    Georganna Skeans, MD

## 2023-05-29 NOTE — Patient Instructions (Signed)
You have been scheduled for a colonoscopy. Please follow written instructions given to you at your visit today.   Please pick up your prep supplies at the pharmacy within the next 1-3 days.  If you use inhalers (even only as needed), please bring them with you on the day of your procedure.  DO NOT TAKE 7 DAYS PRIOR TO TEST- Trulicity (dulaglutide) Ozempic, Wegovy (semaglutide) Mounjaro (tirzepatide) Bydureon Bcise (exanatide extended release)  DO NOT TAKE 1 DAY PRIOR TO YOUR TEST Rybelsus (semaglutide) Adlyxin (lixisenatide) Victoza (liraglutide) Byetta (exanatide) ___________________________________________________________________________ _______________________________________________________  If your blood pressure at your visit was 140/90 or greater, please contact your primary care physician to follow up on this.  _______________________________________________________  If you are age 6 or older, your body mass index should be between 23-30. Your Body mass index is 23.63 kg/m. If this is out of the aforementioned range listed, please consider follow up with your Primary Care Provider.  If you are age 40 or younger, your body mass index should be between 19-25. Your Body mass index is 23.63 kg/m. If this is out of the aformentioned range listed, please consider follow up with your Primary Care Provider.   ________________________________________________________  The [facility] providers would like to encourage you to use [facility] to communicate with providers for non-urgent requests or questions.  Due to long hold times on the telephone, sending your provider a message by [facility] may be a faster and more efficient way to get a response.  Please allow 48 business hours for a response.  Please remember that this is for non-urgent requests.  _______________________________________________________

## 2023-05-31 ENCOUNTER — Ambulatory Visit: Payer: Medicaid Other | Admitting: Gastroenterology

## 2023-05-31 ENCOUNTER — Encounter: Payer: Self-pay | Admitting: Gastroenterology

## 2023-05-31 VITALS — BP 138/78 | HR 80 | Temp 97.9°F | Resp 19 | Ht 69.0 in | Wt 160.0 lb

## 2023-05-31 DIAGNOSIS — D123 Benign neoplasm of transverse colon: Secondary | ICD-10-CM

## 2023-05-31 DIAGNOSIS — D122 Benign neoplasm of ascending colon: Secondary | ICD-10-CM

## 2023-05-31 DIAGNOSIS — K642 Third degree hemorrhoids: Secondary | ICD-10-CM | POA: Diagnosis not present

## 2023-05-31 DIAGNOSIS — K5730 Diverticulosis of large intestine without perforation or abscess without bleeding: Secondary | ICD-10-CM

## 2023-05-31 DIAGNOSIS — K921 Melena: Secondary | ICD-10-CM

## 2023-05-31 MED ORDER — SODIUM CHLORIDE 0.9 % IV SOLN: 500.0000 mL | Freq: Once | INTRAVENOUS | Status: DC

## 2023-05-31 NOTE — Progress Notes (Signed)
Sedate, gd SR, tolerated procedure well, VSS, report to RN 

## 2023-05-31 NOTE — Op Note (Signed)
[facility] Patient Name: Christopher [name] Procedure Date: 05/31/2023 12:06 PM MRN: [id number] Endoscopist: Lorin Picket E. Tomasa Rand , MD, [id number] Age: 58 Referring MD:  Date of Birth: [birth year] Gender: Male Account #: [id number] Procedure:                Colonoscopy Indications:              Hematochezia Medicines:                Monitored Anesthesia Care Procedure:                Pre-Anesthesia Assessment:                           - Prior to the procedure, a History and Physical                            was performed, and patient medications and                            allergies were reviewed. The patient's tolerance of                            previous anesthesia was also reviewed. The risks                            and benefits of the procedure and the sedation                            options and risks were discussed with the patient.                            All questions were answered, and informed consent                            was obtained. Prior Anticoagulants: The patient has                            taken no anticoagulant or antiplatelet agents                            except for aspirin. ASA Grade Assessment: III - A                            patient with severe systemic disease. After                            reviewing the risks and benefits, the patient was                            deemed in satisfactory condition to undergo the                            procedure.  After obtaining informed consent, the colonoscope                            was passed under direct vision. Throughout the                            procedure, the patient's blood pressure, pulse, and                            oxygen saturations were monitored continuously. The                            CF [id number] #[id number] was introduced through the anus                            and advanced to the the cecum, identified by                             appendiceal orifice and ileocecal valve. The                            colonoscopy was performed without difficulty. The                            patient tolerated the procedure well. The quality                            of the bowel preparation was good. The ileocecal                            valve, appendiceal orifice, and rectum were                            photographed. The bowel preparation used was Plenvu                            via split dose instruction. Scope In: 12:16:37 PM Scope Out: 12:38:51 PM Scope Withdrawal Time: 0 hours 15 minutes 57 seconds  Total Procedure Duration: 0 hours 22 minutes 14 seconds  Findings:                 Prolapsed internal hemorrhoids were found on                            perianal exam.                           The digital rectal exam was normal. Pertinent                            negatives include normal sphincter tone and no                            palpable rectal lesions.  Two sessile polyps were found in the ascending                            colon. The polyps were 2 to 4 mm in size. These                            polyps were removed with a cold snare. Resection                            and retrieval were complete. Estimated blood loss                            was minimal.                           An 8 mm polyp was found in the distal transverse                            colon. The polyp was sessile. The polyp was removed                            with a cold snare. Resection and retrieval were                            complete. Estimated blood loss was minimal.                           Multiple medium-mouthed and small-mouthed                            diverticula were found in the sigmoid colon,                            descending colon, transverse colon and ascending                            colon.                           The exam was otherwise normal throughout the                             examined colon.                           Non-bleeding internal hemorrhoids were found during                            retroflexion and during perianal exam. The                            hemorrhoids were large and Grade III (internal                            hemorrhoids that  prolapse but require manual                            reduction).                           No additional abnormalities were found on                            retroflexion. Complications:            No immediate complications. Estimated Blood Loss:     Estimated blood loss was minimal. Impression:               - Prolapsed hemorrhoids found on perianal exam.                           - Two 2 to 4 mm polyps in the ascending colon,                            removed with a cold snare. Resected and retrieved.                           - One 8 mm polyp in the distal transverse colon,                            removed with a cold snare. Resected and retrieved.                           - Moderate diverticulosis in the sigmoid colon, in                            the descending colon, in the transverse colon and                            in the ascending colon.                           - Non-bleeding internal hemorrhoids. This is the                            source of the patient's hematochezia. Recommendation:           - Patient has a contact number available for                            emergencies. The signs and symptoms of potential                            delayed complications were discussed with the                            patient. Return to normal activities tomorrow.                            Written discharge instructions were provided  to the                            patient.                           - Resume previous diet.                           - Continue present medications.                           - Await pathology results.                           -  Repeat colonoscopy (date not yet determined) for                            surveillance based on pathology results.                           - Recommend high fiber diet/daily fiber                            supplementation to reduce hemorrhoidal bleeding.                           - Hemorrhoid banding can be considered, if desired. Kamilya Wakeman E. Tomasa Rand, MD 05/31/2023 12:46:23 PM This report has been signed electronically.

## 2023-05-31 NOTE — Patient Instructions (Addendum)
Continue present medications. Await pathology results. Repeat colonoscopy (date not yet determined) for surveillance based on pathology results Recommend high fiber diet/daily fiber supplementation to reduce hemorrhoidal bleeding. Hemorrhoid banding can be considered, if desired.  Please read over handouts provided   YOU HAD AN ENDOSCOPIC PROCEDURE TODAY AT THE [facility]:   Refer to the procedure report that was given to you for any specific questions about what was found during the examination.  If the procedure report does not answer your questions, please call your gastroenterologist to clarify.  If you requested that your care partner not be given the details of your procedure findings, then the procedure report has been included in a sealed envelope for you to review at your convenience later.  YOU SHOULD EXPECT: Some feelings of bloating in the abdomen. Passage of more gas than usual.  Walking can help get rid of the air that was put into your GI tract during the procedure and reduce the bloating. If you had a lower endoscopy (such as a colonoscopy or flexible sigmoidoscopy) you may notice spotting of blood in your stool or on the toilet paper. If you underwent a bowel prep for your procedure, you may not have a normal bowel movement for a few days.  Please Note:  You might notice some irritation and congestion in your nose or some drainage.  This is from the oxygen used during your procedure.  There is no need for concern and it should clear up in a day or so.  SYMPTOMS TO REPORT IMMEDIATELY:  Following lower endoscopy (colonoscopy or flexible sigmoidoscopy):  Excessive amounts of blood in the stool  Significant tenderness or worsening of abdominal pains  Swelling of the abdomen that is new, acute  Fever of 100F or higher  For urgent or emergent issues, a gastroenterologist can be reached at any hour by calling [phone]. Do not use MyChart messaging for urgent  concerns.    DIET:  We do recommend a small meal at first, but then you may proceed to your regular diet.  Drink plenty of fluids but you should avoid alcoholic beverages for 24 hours.  ACTIVITY:  You should plan to take it easy for the rest of today and you should NOT DRIVE or use heavy machinery until tomorrow (because of the sedation medicines used during the test).    FOLLOW UP: Our staff will call the number listed on your records the next business day following your procedure.  We will call around 7:15- 8:00 am to check on you and address any questions or concerns that you may have regarding the information given to you following your procedure. If we do not reach you, we will leave a message.     If any biopsies were taken you will be contacted by phone or by letter within the next 1-3 weeks.  Please call us at [phone] if you have not heard about the biopsies in 3 weeks.    SIGNATURES/CONFIDENTIALITY: You and/or your care partner have signed paperwork which will be entered into your electronic medical record.  These signatures attest to the fact that that the information above on your After Visit Summary has been reviewed and is understood.  Full responsibility of the confidentiality of this discharge information lies with you and/or your care-partner.

## 2023-05-31 NOTE — Progress Notes (Signed)
Called to room to assist during endoscopic procedure.  Patient ID and intended procedure confirmed with present staff. Received instructions for my participation in the procedure from the performing physician.  

## 2023-05-31 NOTE — Progress Notes (Signed)
History and Physical Interval Note:  05/31/2023 12:10 PM  Christopher [name]  has presented today for endoscopic procedure(s), with the diagnosis of  Encounter Diagnosis  Name Primary?   Hematochezia Yes  .  The various methods of evaluation and treatment have been discussed with the patient and/or family. After consideration of risks, benefits and other options for treatment, the patient has consented to  the endoscopic procedure(s).   The patient's history has been reviewed, patient examined, no change in status, stable for endoscopic procedure(s).  I have reviewed the patient's chart and labs.  Questions were answered to the patient's satisfaction.     Oliver Neuwirth E. Tomasa Rand, MD [facility]

## 2023-06-01 ENCOUNTER — Telehealth: Payer: Self-pay

## 2023-06-01 NOTE — Telephone Encounter (Signed)
  Follow up Call-     05/31/2023   10:53 AM  Call back number  Post procedure Call Back phone  # [phone]  Permission to leave phone message Yes     Patient questions:  Do you have a fever, pain , or abdominal swelling? No. Pain Score  0 *  Have you tolerated food without any problems? Yes.    Have you been able to return to your normal activities? Yes.    Do you have any questions about your discharge instructions: Diet   No. Medications  No. Follow up visit  No.  Do you have questions or concerns about your Care? No.  Actions: * If pain score is 4 or above: No action needed, pain <4.

## 2023-06-04 LAB — SURGICAL PATHOLOGY

## 2023-06-05 ENCOUNTER — Encounter: Payer: Self-pay | Admitting: Family Medicine

## 2023-06-05 ENCOUNTER — Ambulatory Visit (INDEPENDENT_AMBULATORY_CARE_PROVIDER_SITE_OTHER): Payer: Medicaid Other | Admitting: Family Medicine

## 2023-06-05 VITALS — BP 126/70 | HR 94 | Temp 98.3°F | Resp 18 | Ht 69.0 in | Wt 158.0 lb

## 2023-06-05 DIAGNOSIS — K219 Gastro-esophageal reflux disease without esophagitis: Secondary | ICD-10-CM

## 2023-06-05 DIAGNOSIS — M792 Neuralgia and neuritis, unspecified: Secondary | ICD-10-CM

## 2023-06-05 DIAGNOSIS — E785 Hyperlipidemia, unspecified: Secondary | ICD-10-CM

## 2023-06-05 DIAGNOSIS — I10 Essential (primary) hypertension: Secondary | ICD-10-CM

## 2023-06-05 DIAGNOSIS — K649 Unspecified hemorrhoids: Secondary | ICD-10-CM | POA: Diagnosis not present

## 2023-06-05 MED ORDER — OMEPRAZOLE 20 MG PO CPDR: 20.0000 mg | DELAYED_RELEASE_CAPSULE | Freq: Every day | ORAL | 1 refills | Status: DC

## 2023-06-05 MED ORDER — LISINOPRIL 20 MG PO TABS: 20.0000 mg | ORAL_TABLET | Freq: Every day | ORAL | 1 refills | Status: DC

## 2023-06-05 MED ORDER — ATORVASTATIN CALCIUM 20 MG PO TABS: 20.0000 mg | ORAL_TABLET | Freq: Every day | ORAL | 1 refills | Status: DC

## 2023-06-05 MED ORDER — AMLODIPINE BESYLATE 10 MG PO TABS: 10.0000 mg | ORAL_TABLET | Freq: Every day | ORAL | 1 refills | Status: DC

## 2023-06-05 NOTE — Progress Notes (Unsigned)
Established Patient Office Visit  Subjective    Patient ID: Christopher [name], male    DOB: [birth year]  Age: 58 y.o. MRN: [id number]  CC:  Chief Complaint  Patient presents with   Follow-up    3 month    HPI STRATTON VILLWOCK presents for follow up of chronic med issues including hypertension and gerd.  He did follow up with GI re: rectal bleeding and was dx with internal hemorrhoids. patient has right hand numbness. He works as a Curator. He is left hand dominant. Appt 4/1 with neuro  Outpatient Encounter Medications as of 06/05/2023  Medication Sig   acetaminophen (TYLENOL) 325 MG tablet Take 1-2 tablets (325-650 mg total) by mouth every 4 (four) hours as needed for mild pain.   aspirin EC 325 MG tablet Take 1 tablet (325 mg total) by mouth daily.   Blood Pressure Monitoring (BLOOD PRESSURE CUFF) MISC Use to take blood pressure 2 times a day   [DISCONTINUED] amLODipine (NORVASC) 10 MG tablet Take 1 tablet (10 mg total) by mouth daily.   [DISCONTINUED] atorvastatin (LIPITOR) 20 MG tablet Take 1 tablet (20 mg total) by mouth daily.   [DISCONTINUED] lisinopril (ZESTRIL) 20 MG tablet Take 1 tablet (20 mg total) by mouth daily.   amLODipine (NORVASC) 10 MG tablet Take 1 tablet (10 mg total) by mouth daily.   atorvastatin (LIPITOR) 20 MG tablet Take 1 tablet (20 mg total) by mouth daily.   lisinopril (ZESTRIL) 20 MG tablet Take 1 tablet (20 mg total) by mouth daily.   omeprazole (PRILOSEC) 20 MG capsule Take 1 capsule (20 mg total) by mouth daily.   [DISCONTINUED] omeprazole (PRILOSEC) 20 MG capsule Take 1 capsule (20 mg total) by mouth daily.   No facility-administered encounter medications on file as of 06/05/2023.    Past Medical History:  Diagnosis Date   GERD (gastroesophageal reflux disease)    Hyperlipidemia    Hypertension    PUD (peptic ulcer disease) 2008   treated with meds   Stroke (HCC) 01/2021   Stroke (HCC) 09/2021    Past Surgical History:  Procedure Laterality  Date   CLAVICLE SURGERY Right    fracture repair   INGUINAL HERNIA REPAIR Right    age 33   rupture ulcer      Family History  Problem Relation Age of Onset   Cancer Mother    Stroke Father    Colon cancer Neg Hx    Stomach cancer Neg Hx    Esophageal cancer Neg Hx     Social History   Socioeconomic History   Marital status: Divorced    Spouse name: Not on file   Number of children: 0   Years of education: Not on file   Highest education level: Not on file  Occupational History   Occupation: disabled  Tobacco Use   Smoking status: Every Day    Current packs/day: 0.50    Types: Cigarettes   Smokeless tobacco: Former  Building services engineer status: Never Used  Substance and Sexual Activity   Alcohol use: Yes    Comment: 2-3 "sparks" a day   Drug use: Not Currently    Types: "Crack" cocaine    Comment: "only every once in awhile"   Sexual activity: Not Currently  Other Topics Concern   Not on file  Social History Narrative   Not on file   Social Drivers of Health   Financial Resource Strain: Medium Risk (08/16/2022)   Overall  Financial Resource Strain (CARDIA)    Difficulty of Paying Living Expenses: Somewhat hard  Food Insecurity: No Food Insecurity (08/16/2022)   Hunger Vital Sign    Worried About Running Out of Food in the Last Year: Never true    Ran Out of Food in the Last Year: Never true  Transportation Needs: No Transportation Needs (08/16/2022)   PRAPARE - Administrator, Civil Service (Medical): No    Lack of Transportation (Non-Medical): No  Physical Activity: Sufficiently Active (08/16/2022)   Exercise Vital Sign    Days of Exercise per Week: 7 days    Minutes of Exercise per Session: 30 min  Stress: No Stress Concern Present (02/15/2023)   [employer] of Occupational Health - Occupational Stress Questionnaire    Feeling of Stress : Not at all  Social Connections: Socially Isolated (08/16/2022)   Social Connection and Isolation Panel  [NHANES]    Frequency of Communication with Friends and Family: More than three times a week    Frequency of Social Gatherings with Friends and Family: More than three times a week    Attends Religious Services: Never    Database administrator or Organizations: No    Attends Banker Meetings: Never    Marital Status: Separated  Intimate Partner Violence: Not At Risk (08/16/2022)   Humiliation, Afraid, Rape, and Kick questionnaire    Fear of Current or Ex-Partner: No    Emotionally Abused: No    Physically Abused: No    Sexually Abused: No    Review of Systems  All other systems reviewed and are negative.       Objective    BP (!) 142/76 (BP Location: Left Arm, Patient Position: Sitting, Cuff Size: Normal)   Pulse 94   Temp 98.3 F (36.8 C) (Oral)   Resp 18   Ht 5\' 9"  (1.753 m)   Wt 158 lb (71.7 kg)   BMI 23.33 kg/m   Physical Exam Vitals and nursing note reviewed.  Constitutional:      General: He is not in acute distress. Cardiovascular:     Rate and Rhythm: Normal rate and regular rhythm.  Pulmonary:     Effort: Pulmonary effort is normal.     Breath sounds: Normal breath sounds.  Abdominal:     Palpations: Abdomen is soft.     Tenderness: There is no abdominal tenderness.  Neurological:     General: No focal deficit present.     Mental Status: He is alert and oriented to person, place, and time.         Assessment & Plan:   1. Hemorrhoids, unspecified hemorrhoid type (Primary) Management as per consultant.   2. Essential hypertension Appears stable. continue - amLODipine (NORVASC) 10 MG tablet; Take 1 tablet (10 mg total) by mouth daily.  Dispense: 90 tablet; Refill: 1 - lisinopril (ZESTRIL) 20 MG tablet; Take 1 tablet (20 mg total) by mouth daily.  Dispense: 90 tablet; Refill: 1  3. Dyslipidemia Continue  - atorvastatin (LIPITOR) 20 MG tablet; Take 1 tablet (20 mg total) by mouth daily.  Dispense: 90 tablet; Refill: 1  4.  Gastroesophageal reflux disease without esophagitis Continue  - omeprazole (PRILOSEC) 20 MG capsule; Take 1 capsule (20 mg total) by mouth daily.  Dispense: 90 capsule; Refill: 1  5. Neuralgia Keep scheduled appt with neuro   Return in about 3 months (around 09/03/2023).   [name], MD

## 2023-06-06 ENCOUNTER — Encounter: Payer: Self-pay | Admitting: Gastroenterology

## 2023-06-07 ENCOUNTER — Encounter: Payer: Self-pay | Admitting: Family Medicine

## 2023-06-14 ENCOUNTER — Ambulatory Visit: Payer: Medicaid Other | Admitting: Neurology

## 2023-07-12 ENCOUNTER — Ambulatory Visit: Payer: Self-pay | Admitting: Family Medicine

## 2023-07-12 ENCOUNTER — Other Ambulatory Visit: Payer: Self-pay

## 2023-07-12 ENCOUNTER — Encounter (HOSPITAL_COMMUNITY): Payer: Self-pay

## 2023-07-12 ENCOUNTER — Emergency Department (HOSPITAL_COMMUNITY)

## 2023-07-12 ENCOUNTER — Ambulatory Visit
Admission: EM | Admit: 2023-07-12 | Discharge: 2023-07-12 | Disposition: A | Discharge status: Home / Self Care | Attending: Physician Assistant | Admitting: Physician Assistant

## 2023-07-12 ENCOUNTER — Inpatient Hospital Stay (HOSPITAL_COMMUNITY)
Admission: EM | Admit: 2023-07-12 | Discharge: 2023-07-24 | DRG: 871 | Disposition: A | Discharge status: Hospice | Source: Ambulatory Visit | Attending: Family Medicine | Admitting: Family Medicine

## 2023-07-12 DIAGNOSIS — Z720 Tobacco use: Secondary | ICD-10-CM | POA: Diagnosis present

## 2023-07-12 DIAGNOSIS — R0902 Hypoxemia: Secondary | ICD-10-CM

## 2023-07-12 DIAGNOSIS — E876 Hypokalemia: Secondary | ICD-10-CM | POA: Insufficient documentation

## 2023-07-12 DIAGNOSIS — Z79899 Other long term (current) drug therapy: Secondary | ICD-10-CM | POA: Diagnosis not present

## 2023-07-12 DIAGNOSIS — J9601 Acute respiratory failure with hypoxia: Secondary | ICD-10-CM | POA: Diagnosis present

## 2023-07-12 DIAGNOSIS — I10 Essential (primary) hypertension: Secondary | ICD-10-CM | POA: Diagnosis present

## 2023-07-12 DIAGNOSIS — J441 Chronic obstructive pulmonary disease with (acute) exacerbation: Secondary | ICD-10-CM | POA: Diagnosis present

## 2023-07-12 DIAGNOSIS — R6520 Severe sepsis without septic shock: Secondary | ICD-10-CM | POA: Diagnosis present

## 2023-07-12 DIAGNOSIS — E872 Acidosis, unspecified: Secondary | ICD-10-CM

## 2023-07-12 DIAGNOSIS — E871 Hypo-osmolality and hyponatremia: Secondary | ICD-10-CM | POA: Diagnosis present

## 2023-07-12 DIAGNOSIS — I69322 Dysarthria following cerebral infarction: Secondary | ICD-10-CM

## 2023-07-12 DIAGNOSIS — Z7982 Long term (current) use of aspirin: Secondary | ICD-10-CM

## 2023-07-12 DIAGNOSIS — Z91199 Patient's noncompliance with other medical treatment and regimen due to unspecified reason: Secondary | ICD-10-CM

## 2023-07-12 DIAGNOSIS — T380X5A Adverse effect of glucocorticoids and synthetic analogues, initial encounter: Secondary | ICD-10-CM | POA: Diagnosis present

## 2023-07-12 DIAGNOSIS — I5032 Chronic diastolic (congestive) heart failure: Secondary | ICD-10-CM | POA: Diagnosis present

## 2023-07-12 DIAGNOSIS — J1008 Influenza due to other identified influenza virus with other specified pneumonia: Secondary | ICD-10-CM | POA: Diagnosis present

## 2023-07-12 DIAGNOSIS — Z515 Encounter for palliative care: Secondary | ICD-10-CM | POA: Diagnosis not present

## 2023-07-12 DIAGNOSIS — E785 Hyperlipidemia, unspecified: Secondary | ICD-10-CM | POA: Diagnosis present

## 2023-07-12 DIAGNOSIS — R45851 Suicidal ideations: Secondary | ICD-10-CM | POA: Diagnosis present

## 2023-07-12 DIAGNOSIS — J440 Chronic obstructive pulmonary disease with acute lower respiratory infection: Secondary | ICD-10-CM | POA: Diagnosis present

## 2023-07-12 DIAGNOSIS — Z1152 Encounter for screening for COVID-19: Secondary | ICD-10-CM | POA: Diagnosis not present

## 2023-07-12 DIAGNOSIS — I69351 Hemiplegia and hemiparesis following cerebral infarction affecting right dominant side: Secondary | ICD-10-CM | POA: Diagnosis not present

## 2023-07-12 DIAGNOSIS — E8721 Acute metabolic acidosis: Secondary | ICD-10-CM | POA: Diagnosis present

## 2023-07-12 DIAGNOSIS — F331 Major depressive disorder, recurrent, moderate: Secondary | ICD-10-CM | POA: Diagnosis not present

## 2023-07-12 DIAGNOSIS — Z823 Family history of stroke: Secondary | ICD-10-CM

## 2023-07-12 DIAGNOSIS — F17210 Nicotine dependence, cigarettes, uncomplicated: Secondary | ICD-10-CM | POA: Diagnosis present

## 2023-07-12 DIAGNOSIS — Z8711 Personal history of peptic ulcer disease: Secondary | ICD-10-CM

## 2023-07-12 DIAGNOSIS — I110 Hypertensive heart disease with heart failure: Secondary | ICD-10-CM | POA: Diagnosis present

## 2023-07-12 DIAGNOSIS — R0603 Acute respiratory distress: Secondary | ICD-10-CM | POA: Diagnosis not present

## 2023-07-12 DIAGNOSIS — J101 Influenza due to other identified influenza virus with other respiratory manifestations: Principal | ICD-10-CM

## 2023-07-12 DIAGNOSIS — R54 Age-related physical debility: Secondary | ICD-10-CM | POA: Diagnosis present

## 2023-07-12 DIAGNOSIS — R0609 Other forms of dyspnea: Secondary | ICD-10-CM | POA: Diagnosis not present

## 2023-07-12 DIAGNOSIS — Z7189 Other specified counseling: Secondary | ICD-10-CM | POA: Diagnosis not present

## 2023-07-12 DIAGNOSIS — N179 Acute kidney failure, unspecified: Secondary | ICD-10-CM | POA: Diagnosis present

## 2023-07-12 DIAGNOSIS — F40240 Claustrophobia: Secondary | ICD-10-CM | POA: Diagnosis present

## 2023-07-12 DIAGNOSIS — R7302 Impaired glucose tolerance (oral): Secondary | ICD-10-CM | POA: Diagnosis present

## 2023-07-12 DIAGNOSIS — J1100 Influenza due to unidentified influenza virus with unspecified type of pneumonia: Secondary | ICD-10-CM | POA: Diagnosis not present

## 2023-07-12 DIAGNOSIS — A413 Sepsis due to Hemophilus influenzae: Secondary | ICD-10-CM | POA: Diagnosis present

## 2023-07-12 DIAGNOSIS — K219 Gastro-esophageal reflux disease without esophagitis: Secondary | ICD-10-CM | POA: Diagnosis present

## 2023-07-12 DIAGNOSIS — T501X5A Adverse effect of loop [high-ceiling] diuretics, initial encounter: Secondary | ICD-10-CM | POA: Diagnosis present

## 2023-07-12 DIAGNOSIS — Z66 Do not resuscitate: Secondary | ICD-10-CM | POA: Diagnosis present

## 2023-07-12 DIAGNOSIS — A419 Sepsis, unspecified organism: Secondary | ICD-10-CM | POA: Diagnosis present

## 2023-07-12 DIAGNOSIS — J09X2 Influenza due to identified novel influenza A virus with other respiratory manifestations: Secondary | ICD-10-CM | POA: Diagnosis not present

## 2023-07-12 LAB — I-STAT VENOUS BLOOD GAS, ED
Acid-base deficit: 2 mmol/L (ref 0.0–2.0)
Bicarbonate: 24.1 mmol/L (ref 20.0–28.0)
Calcium, Ion: 1.04 mmol/L — ABNORMAL LOW (ref 1.15–1.40)
HCT: 39 % (ref 39.0–52.0)
Hemoglobin: 13.3 g/dL (ref 13.0–17.0)
O2 Saturation: 80 %
Potassium: 3.2 mmol/L — ABNORMAL LOW (ref 3.5–5.1)
Sodium: 139 mmol/L (ref 135–145)
TCO2: 25 mmol/L (ref 22–32)
pCO2, Ven: 42.9 mmHg — ABNORMAL LOW (ref 44–60)
pH, Ven: 7.356 (ref 7.25–7.43)
pO2, Ven: 46 mmHg — ABNORMAL HIGH (ref 32–45)

## 2023-07-12 LAB — URINALYSIS, W/ REFLEX TO CULTURE (INFECTION SUSPECTED)
Bilirubin Urine: NEGATIVE
Glucose, UA: NEGATIVE mg/dL
Ketones, ur: NEGATIVE mg/dL
Leukocytes,Ua: NEGATIVE
Nitrite: NEGATIVE
Protein, ur: 30 mg/dL — AB
Specific Gravity, Urine: 1.014 (ref 1.005–1.030)
pH: 5 (ref 5.0–8.0)

## 2023-07-12 LAB — CBC WITH DIFFERENTIAL/PLATELET
Abs Immature Granulocytes: 0.06 10*3/uL (ref 0.00–0.07)
Basophils Absolute: 0 10*3/uL (ref 0.0–0.1)
Basophils Relative: 0 %
Eosinophils Absolute: 0 10*3/uL (ref 0.0–0.5)
Eosinophils Relative: 0 %
HCT: 38.5 % — ABNORMAL LOW (ref 39.0–52.0)
Hemoglobin: 12.4 g/dL — ABNORMAL LOW (ref 13.0–17.0)
Immature Granulocytes: 1 %
Lymphocytes Relative: 11 %
Lymphs Abs: 1.3 10*3/uL (ref 0.7–4.0)
MCH: 28 pg (ref 26.0–34.0)
MCHC: 32.2 g/dL (ref 30.0–36.0)
MCV: 86.9 fL (ref 80.0–100.0)
Monocytes Absolute: 0.5 10*3/uL (ref 0.1–1.0)
Monocytes Relative: 5 %
Neutro Abs: 9.6 10*3/uL — ABNORMAL HIGH (ref 1.7–7.7)
Neutrophils Relative %: 83 %
Platelets: 202 10*3/uL (ref 150–400)
RBC: 4.43 MIL/uL (ref 4.22–5.81)
RDW: 12.9 % (ref 11.5–15.5)
WBC: 11.6 10*3/uL — ABNORMAL HIGH (ref 4.0–10.5)
nRBC: 0 % (ref 0.0–0.2)

## 2023-07-12 LAB — COMPREHENSIVE METABOLIC PANEL
ALT: 21 U/L (ref 0–44)
AST: 54 U/L — ABNORMAL HIGH (ref 15–41)
Albumin: 3.9 g/dL (ref 3.5–5.0)
Alkaline Phosphatase: 118 U/L (ref 38–126)
Anion gap: 15 (ref 5–15)
BUN: 18 mg/dL (ref 6–20)
CO2: 22 mmol/L (ref 22–32)
Calcium: 8.5 mg/dL — ABNORMAL LOW (ref 8.9–10.3)
Chloride: 100 mmol/L (ref 98–111)
Creatinine, Ser: 1.9 mg/dL — ABNORMAL HIGH (ref 0.61–1.24)
GFR, Estimated: 40 mL/min — ABNORMAL LOW (ref >60)
Glucose, Bld: 166 mg/dL — ABNORMAL HIGH (ref 70–99)
Potassium: 3.2 mmol/L — ABNORMAL LOW (ref 3.5–5.1)
Sodium: 137 mmol/L (ref 135–145)
Total Bilirubin: 0.4 mg/dL (ref 0.0–1.2)
Total Protein: 7.5 g/dL (ref 6.5–8.1)

## 2023-07-12 LAB — I-STAT CG4 LACTIC ACID, ED
Lactic Acid, Venous: 4.7 mmol/L (ref 0.5–1.9)
Lactic Acid, Venous: 4.2 mmol/L (ref 0.5–1.9)

## 2023-07-12 LAB — CBG MONITORING, ED: Glucose-Capillary: 196 mg/dL — ABNORMAL HIGH (ref 70–99)

## 2023-07-12 LAB — PROTIME-INR
INR: 1 (ref 0.8–1.2)
Prothrombin Time: 13.4 s (ref 11.4–15.2)

## 2023-07-12 LAB — RESP PANEL BY RT-PCR (RSV, FLU A&B, COVID)  RVPGX2
Influenza A by PCR: POSITIVE — AB
Influenza B by PCR: NEGATIVE
Resp Syncytial Virus by PCR: NEGATIVE
SARS Coronavirus 2 by RT PCR: NEGATIVE

## 2023-07-12 MED ORDER — PREDNISONE 20 MG PO TABS
40.0000 mg | ORAL_TABLET | Freq: Every day | ORAL | Status: DC
  Administered 2023-07-13 – 2023-07-14 (×2): 40 mg via ORAL
  Filled 2023-07-12 (×2): qty 2

## 2023-07-12 MED ORDER — IPRATROPIUM-ALBUTEROL 0.5-2.5 (3) MG/3ML IN SOLN
3.0000 mL | Freq: Once | RESPIRATORY_TRACT | Status: AC
  Administered 2023-07-12: 3 mL via RESPIRATORY_TRACT

## 2023-07-12 MED ORDER — ONDANSETRON HCL 4 MG PO TABS: 4.0000 mg | ORAL_TABLET | Freq: Four times a day (QID) | ORAL | Status: DC | PRN

## 2023-07-12 MED ORDER — ASPIRIN 325 MG PO TBEC
325.0000 mg | DELAYED_RELEASE_TABLET | Freq: Every day | ORAL | Status: DC
  Administered 2023-07-13 – 2023-07-17 (×5): 325 mg via ORAL
  Filled 2023-07-12 (×6): qty 1

## 2023-07-12 MED ORDER — IPRATROPIUM-ALBUTEROL 0.5-2.5 (3) MG/3ML IN SOLN
3.0000 mL | Freq: Once | RESPIRATORY_TRACT | Status: AC
  Administered 2023-07-12: 3 mL via RESPIRATORY_TRACT
  Filled 2023-07-12: qty 3

## 2023-07-12 MED ORDER — HEPARIN SODIUM (PORCINE) 5000 UNIT/ML IJ SOLN
5000.0000 [IU] | Freq: Three times a day (TID) | INTRAMUSCULAR | Status: DC
  Administered 2023-07-12 – 2023-07-24 (×35): 5000 [IU] via SUBCUTANEOUS
  Filled 2023-07-12 (×35): qty 1

## 2023-07-12 MED ORDER — IPRATROPIUM-ALBUTEROL 0.5-2.5 (3) MG/3ML IN SOLN
3.0000 mL | Freq: Four times a day (QID) | RESPIRATORY_TRACT | Status: DC
  Administered 2023-07-12 – 2023-07-21 (×28): 3 mL via RESPIRATORY_TRACT
  Filled 2023-07-12 (×32): qty 3

## 2023-07-12 MED ORDER — LACTATED RINGERS IV BOLUS (SEPSIS)
1000.0000 mL | Freq: Once | INTRAVENOUS | Status: AC
  Administered 2023-07-12: 1000 mL via INTRAVENOUS

## 2023-07-12 MED ORDER — POTASSIUM CHLORIDE CRYS ER 20 MEQ PO TBCR
20.0000 meq | EXTENDED_RELEASE_TABLET | Freq: Two times a day (BID) | ORAL | Status: AC
  Administered 2023-07-13 – 2023-07-15 (×6): 20 meq via ORAL
  Filled 2023-07-12 (×6): qty 1

## 2023-07-12 MED ORDER — LACTATED RINGERS IV BOLUS (SEPSIS)
250.0000 mL | Freq: Once | INTRAVENOUS | Status: AC
  Administered 2023-07-12: 250 mL via INTRAVENOUS

## 2023-07-12 MED ORDER — ACETAMINOPHEN 325 MG PO TABS
650.0000 mg | ORAL_TABLET | Freq: Once | ORAL | Status: AC
  Administered 2023-07-12: 650 mg via ORAL
  Filled 2023-07-12: qty 2

## 2023-07-12 MED ORDER — PANTOPRAZOLE SODIUM 40 MG PO TBEC
40.0000 mg | DELAYED_RELEASE_TABLET | Freq: Every day | ORAL | Status: DC
  Administered 2023-07-12 – 2023-07-23 (×12): 40 mg via ORAL
  Filled 2023-07-12 (×12): qty 1

## 2023-07-12 MED ORDER — LORAZEPAM 2 MG/ML IJ SOLN: 1.0000 mg | INTRAMUSCULAR | Status: AC | PRN

## 2023-07-12 MED ORDER — FLUTICASONE FUROATE-VILANTEROL 100-25 MCG/ACT IN AEPB
1.0000 | INHALATION_SPRAY | Freq: Every day | RESPIRATORY_TRACT | Status: DC
  Filled 2023-07-12: qty 28

## 2023-07-12 MED ORDER — ATORVASTATIN CALCIUM 10 MG PO TABS
20.0000 mg | ORAL_TABLET | Freq: Every day | ORAL | Status: DC
  Administered 2023-07-12 – 2023-07-17 (×6): 20 mg via ORAL
  Filled 2023-07-12 (×6): qty 2

## 2023-07-12 MED ORDER — LACTATED RINGERS IV SOLN: INTRAVENOUS | Status: DC

## 2023-07-12 MED ORDER — ACETAMINOPHEN 650 MG RE SUPP: 650.0000 mg | Freq: Four times a day (QID) | RECTAL | Status: DC | PRN

## 2023-07-12 MED ORDER — ACETAMINOPHEN 325 MG PO TABS
650.0000 mg | ORAL_TABLET | Freq: Four times a day (QID) | ORAL | Status: DC | PRN
  Administered 2023-07-14 – 2023-07-23 (×3): 650 mg via ORAL
  Filled 2023-07-12 (×3): qty 2

## 2023-07-12 MED ORDER — METOPROLOL TARTRATE 5 MG/5ML IV SOLN: 5.0000 mg | Freq: Four times a day (QID) | INTRAVENOUS | Status: DC | PRN

## 2023-07-12 MED ORDER — THIAMINE MONONITRATE 100 MG PO TABS
100.0000 mg | ORAL_TABLET | Freq: Every day | ORAL | Status: DC
  Administered 2023-07-12 – 2023-07-24 (×13): 100 mg via ORAL
  Filled 2023-07-12 (×13): qty 1

## 2023-07-12 MED ORDER — LORAZEPAM 1 MG PO TABS: 1.0000 mg | ORAL_TABLET | ORAL | Status: AC | PRN

## 2023-07-12 MED ORDER — ONDANSETRON HCL 4 MG/2ML IJ SOLN: 4.0000 mg | Freq: Four times a day (QID) | INTRAMUSCULAR | Status: DC | PRN

## 2023-07-12 MED ORDER — DOXYCYCLINE HYCLATE 100 MG PO TABS
100.0000 mg | ORAL_TABLET | Freq: Two times a day (BID) | ORAL | Status: DC
  Administered 2023-07-12 – 2023-07-16 (×8): 100 mg via ORAL
  Filled 2023-07-12 (×8): qty 1

## 2023-07-12 MED ORDER — OXYCODONE HCL 5 MG PO TABS
5.0000 mg | ORAL_TABLET | ORAL | Status: DC | PRN
  Filled 2023-07-12: qty 1

## 2023-07-12 MED ORDER — SODIUM CHLORIDE 0.9 % IV SOLN
2.0000 g | Freq: Once | INTRAVENOUS | Status: AC
  Administered 2023-07-12: 2 g via INTRAVENOUS
  Filled 2023-07-12: qty 20

## 2023-07-12 MED ORDER — ADULT MULTIVITAMIN W/MINERALS CH
1.0000 | ORAL_TABLET | Freq: Every day | ORAL | Status: DC
  Administered 2023-07-12 – 2023-07-24 (×13): 1 via ORAL
  Filled 2023-07-12 (×13): qty 1

## 2023-07-12 MED ORDER — UMECLIDINIUM BROMIDE 62.5 MCG/ACT IN AEPB
1.0000 | INHALATION_SPRAY | Freq: Every day | RESPIRATORY_TRACT | Status: DC
  Administered 2023-07-16 – 2023-07-24 (×9): 1 via RESPIRATORY_TRACT
  Filled 2023-07-12 (×2): qty 7

## 2023-07-12 MED ORDER — INSULIN ASPART 100 UNIT/ML IJ SOLN
0.0000 [IU] | Freq: Three times a day (TID) | INTRAMUSCULAR | Status: DC
  Administered 2023-07-14: 2 [IU] via SUBCUTANEOUS
  Administered 2023-07-14: 1 [IU] via SUBCUTANEOUS
  Administered 2023-07-15 (×2): 2 [IU] via SUBCUTANEOUS
  Administered 2023-07-15 – 2023-07-16 (×2): 1 [IU] via SUBCUTANEOUS
  Administered 2023-07-16 (×2): 2 [IU] via SUBCUTANEOUS
  Administered 2023-07-17 – 2023-07-18 (×3): 1 [IU] via SUBCUTANEOUS
  Administered 2023-07-18: 2 [IU] via SUBCUTANEOUS
  Administered 2023-07-18 – 2023-07-20 (×5): 1 [IU] via SUBCUTANEOUS
  Administered 2023-07-21 – 2023-07-22 (×4): 2 [IU] via SUBCUTANEOUS
  Administered 2023-07-22: 1 [IU] via SUBCUTANEOUS
  Administered 2023-07-23: 2 [IU] via SUBCUTANEOUS
  Administered 2023-07-23: 1 [IU] via SUBCUTANEOUS

## 2023-07-12 MED ORDER — FOLIC ACID 1 MG PO TABS
1.0000 mg | ORAL_TABLET | Freq: Every day | ORAL | Status: DC
  Administered 2023-07-12 – 2023-07-24 (×13): 1 mg via ORAL
  Filled 2023-07-12 (×13): qty 1

## 2023-07-12 MED ORDER — ALBUTEROL SULFATE (2.5 MG/3ML) 0.083% IN NEBU
2.5000 mg | INHALATION_SOLUTION | Freq: Four times a day (QID) | RESPIRATORY_TRACT | Status: DC
Start: 2023-07-12 — End: 2023-07-13
  Administered 2023-07-12: 2.5 mg via RESPIRATORY_TRACT
  Filled 2023-07-12 (×2): qty 3

## 2023-07-12 MED ORDER — ALBUTEROL SULFATE (2.5 MG/3ML) 0.083% IN NEBU
2.5000 mg | INHALATION_SOLUTION | RESPIRATORY_TRACT | Status: DC | PRN
  Filled 2023-07-12: qty 3

## 2023-07-12 MED ORDER — METHYLPREDNISOLONE SODIUM SUCC 125 MG IJ SOLR
125.0000 mg | Freq: Once | INTRAMUSCULAR | Status: AC
Start: 2023-07-12 — End: 2023-07-12
  Administered 2023-07-12: 125 mg via INTRAVENOUS
  Filled 2023-07-12: qty 2

## 2023-07-12 MED ORDER — THIAMINE HCL 100 MG/ML IJ SOLN: 100.0000 mg | Freq: Every day | INTRAMUSCULAR | Status: DC

## 2023-07-12 MED ORDER — SODIUM CHLORIDE 0.9 % IV SOLN
500.0000 mg | Freq: Once | INTRAVENOUS | Status: AC
  Administered 2023-07-12: 500 mg via INTRAVENOUS
  Filled 2023-07-12: qty 5

## 2023-07-12 MED ORDER — POLYETHYLENE GLYCOL 3350 17 G PO PACK: 17.0000 g | PACK | Freq: Every day | ORAL | Status: DC | PRN

## 2023-07-12 NOTE — H&P (Signed)
 History and Physical    Patient: Christopher [name] WUJ:[id number] DOB: 1965-06-03 DOA: 07/12/2023 DOS: the patient was seen and examined on 07/12/2023 PCP: Georganna Skeans, MD  Patient coming from: Home resides at home with his sister  Chief Complaint:  Chief Complaint  Patient presents with   Shortness of Breath   HPI: ADRICK [name] is a 58 y.o. male with medical history significant of  HTN, COPD, CVA x 2, ongoing tobacco and alcohol use who reports 3-day history of fever to 102, cough, increasing shortness of breath.  He denies significant sick contacts.  On presentation to the ED he was noted to be hypoxic and required up to 5 L of oxygen.  He also tested positive for influenza A and was noted to be wheezing.  He had an elevated lactic acid at 4.7.  Sepsis protocol was initiated and he received significant IV fluid boluses and IV antibiotics.  He was given Solu-Medrol and antibiotics and nebulizers. Review of Systems: As mentioned in the history of present illness. All other systems reviewed and are negative. Past Medical History:  Diagnosis Date   GERD (gastroesophageal reflux disease)    Hyperlipidemia    Hypertension    PUD (peptic ulcer disease) 2008   treated with meds   Stroke (HCC) 01/2021   Stroke (HCC) 09/2021   Past Surgical History:  Procedure Laterality Date   CLAVICLE SURGERY Right    fracture repair   INGUINAL HERNIA REPAIR Right    age 2   rupture ulcer     Social History:  reports that he has been smoking cigarettes. He has quit using smokeless tobacco. He reports current alcohol use. He reports that he does not currently use drugs after having used the following drugs: "Crack" cocaine.  No Known Allergies  Family History  Problem Relation Age of Onset   Cancer Mother    Stroke Father    Colon cancer Neg Hx    Stomach cancer Neg Hx    Esophageal cancer Neg Hx     Prior to Admission medications   Medication Sig Start Date End Date Taking? Authorizing  Provider  acetaminophen (TYLENOL) 325 MG tablet Take 1-2 tablets (325-650 mg total) by mouth every 4 (four) hours as needed for mild pain. 09/21/21   Setzer, Lynnell Jude, PA-C  amLODipine (NORVASC) 10 MG tablet Take 1 tablet (10 mg total) by mouth daily. 06/05/23   Georganna Skeans, MD  aspirin EC 325 MG tablet Take 1 tablet (325 mg total) by mouth daily. 06/13/22   Micki Riley, MD  atorvastatin (LIPITOR) 20 MG tablet Take 1 tablet (20 mg total) by mouth daily. 06/05/23   Georganna Skeans, MD  Blood Pressure Monitoring (BLOOD PRESSURE CUFF) MISC Use to take blood pressure 2 times a day 01/23/22   Georganna Skeans, MD  lisinopril (ZESTRIL) 20 MG tablet Take 1 tablet (20 mg total) by mouth daily. 06/05/23   Georganna Skeans, MD  omeprazole (PRILOSEC) 20 MG capsule Take 1 capsule (20 mg total) by mouth daily. 06/05/23 12/02/23  Georganna Skeans, MD    Physical Exam: Vitals:   07/12/23 1830 07/12/23 1930 07/12/23 2030 07/12/23 2034  BP: 111/65 112/61 107/62   Pulse: (!) 129 (!) 107 (!) 103   Resp: (!) 32 (!) 34 (!) 25   Temp:    99.6 F (37.6 C)  TempSrc:    Oral  SpO2: (!) 89% (!) 89% 95%   Weight:      Height:  Physical Examination: General appearance - alert, chronically ill appearing, and in no distress Chest - clear to auscultation, no wheezes, rales or rhonchi, symmetric air entry Heart - normal rate, regular rhythm, normal S1, S2, no murmurs, rubs, clicks or gallops Abdomen - soft, nontender, nondistended, no masses or organomegaly Neurological - alert, oriented, dysarthric speech Extremities - peripheral pulses normal, no pedal edema, no clubbing or cyanosis  Data Reviewed: Results for orders placed or performed during the hospital encounter of 07/12/23 (from the past 24 hours)  Comprehensive metabolic panel     Status: Abnormal   Collection Time: 07/12/23  3:13 PM  Result Value Ref Range   Sodium 137 135 - 145 mmol/L   Potassium 3.2 (L) 3.5 - 5.1 mmol/L   Chloride 100 98 - 111 mmol/L    CO2 22 22 - 32 mmol/L   Glucose, Bld 166 (H) 70 - 99 mg/dL   BUN 18 6 - 20 mg/dL   Creatinine, Ser 1.61 (H) 0.61 - 1.24 mg/dL   Calcium 8.5 (L) 8.9 - 10.3 mg/dL   Total Protein 7.5 6.5 - 8.1 g/dL   Albumin 3.9 3.5 - 5.0 g/dL   AST 54 (H) 15 - 41 U/L   ALT 21 0 - 44 U/L   Alkaline Phosphatase 118 38 - 126 U/L   Total Bilirubin 0.4 0.0 - 1.2 mg/dL   GFR, Estimated 40 (L) >60 mL/min   Anion gap 15 5 - 15  CBC with Differential     Status: Abnormal   Collection Time: 07/12/23  3:13 PM  Result Value Ref Range   WBC 11.6 (H) 4.0 - 10.5 K/uL   RBC 4.43 4.22 - 5.81 MIL/uL   Hemoglobin 12.4 (L) 13.0 - 17.0 g/dL   HCT 09.6 (L) 04.5 - 40.9 %   MCV 86.9 80.0 - 100.0 fL   MCH 28.0 26.0 - 34.0 pg   MCHC 32.2 30.0 - 36.0 g/dL   RDW 81.1 91.4 - 78.2 %   Platelets 202 150 - 400 K/uL   nRBC 0.0 0.0 - 0.2 %   Neutrophils Relative % 83 %   Neutro Abs 9.6 (H) 1.7 - 7.7 K/uL   Lymphocytes Relative 11 %   Lymphs Abs 1.3 0.7 - 4.0 K/uL   Monocytes Relative 5 %   Monocytes Absolute 0.5 0.1 - 1.0 K/uL   Eosinophils Relative 0 %   Eosinophils Absolute 0.0 0.0 - 0.5 K/uL   Basophils Relative 0 %   Basophils Absolute 0.0 0.0 - 0.1 K/uL   Immature Granulocytes 1 %   Abs Immature Granulocytes 0.06 0.00 - 0.07 K/uL  Protime-INR     Status: None   Collection Time: 07/12/23  3:13 PM  Result Value Ref Range   Prothrombin Time 13.4 11.4 - 15.2 seconds   INR 1.0 0.8 - 1.2  I-Stat venous blood gas, ED     Status: Abnormal   Collection Time: 07/12/23  3:39 PM  Result Value Ref Range   pH, Ven 7.356 7.25 - 7.43   pCO2, Ven 42.9 (L) 44 - 60 mmHg   pO2, Ven 46 (H) 32 - 45 mmHg   Bicarbonate 24.1 20.0 - 28.0 mmol/L   TCO2 25 22 - 32 mmol/L   O2 Saturation 80 %   Acid-base deficit 2.0 0.0 - 2.0 mmol/L   Sodium 139 135 - 145 mmol/L   Potassium 3.2 (L) 3.5 - 5.1 mmol/L   Calcium, Ion 1.04 (L) 1.15 - 1.40 mmol/L   HCT  39.0 39.0 - 52.0 %   Hemoglobin 13.3 13.0 - 17.0 g/dL   Sample type VENOUS   I-Stat  Lactic Acid, ED     Status: Abnormal   Collection Time: 07/12/23  4:17 PM  Result Value Ref Range   Lactic Acid, Venous 4.7 (HH) 0.5 - 1.9 mmol/L   Comment NOTIFIED PHYSICIAN   I-Stat Lactic Acid, ED     Status: Abnormal   Collection Time: 07/12/23  5:23 PM  Result Value Ref Range   Lactic Acid, Venous 4.2 (HH) 0.5 - 1.9 mmol/L   Comment NOTIFIED PHYSICIAN   Resp panel by RT-PCR (RSV, Flu A&B, Covid) Anterior Nasal Swab     Status: Abnormal   Collection Time: 07/12/23  6:53 PM   Specimen: Anterior Nasal Swab  Result Value Ref Range   SARS Coronavirus 2 by RT PCR NEGATIVE NEGATIVE   Influenza A by PCR POSITIVE (A) NEGATIVE   Influenza B by PCR NEGATIVE NEGATIVE   Resp Syncytial Virus by PCR NEGATIVE NEGATIVE  CBG monitoring, ED     Status: Abnormal   Collection Time: 07/12/23 10:36 PM  Result Value Ref Range   Glucose-Capillary 196 (H) 70 - 99 mg/dL   DG Chest Portable 1 View Result Date: 07/12/2023 CLINICAL DATA:  Shortness of breath, cough, fever. EXAM: PORTABLE CHEST 1 VIEW COMPARISON:  October 21, 2006. FINDINGS: The heart size and mediastinal contours are within normal limits. Both lungs are clear. The visualized skeletal structures are unremarkable. IMPRESSION: No active disease. Electronically Signed   By: Lupita Raider M.D.   On: 07/12/2023 17:49    Assessment and Plan: * COPD exacerbation (HCC) Oral antibiotics Inhaled steroids and LABA, LAMA Scheduled DuoNebs and every 2 hours as needed. Oxygen as needed  Hypokalemia Replete and trend  Hemiparesis affecting right side as late effect of cerebrovascular accident (CVA) (HCC) This was from sometime ago.  The patient reports no problems with ambulation at this time. On aspirin daily  Gastroesophageal reflux disease without esophagitis Continue PPI  Dyslipidemia Continue statin  Essential hypertension Patient is soft blood pressures at this time. Holding his metoprolol and amlodipine.  Tobacco abuse Patient  offered and declined nicotine patch at this time.  He reports smoking 1/2 pack/day.  AKI (acute kidney injury) (HCC) Serum creatinine is 1.90 Baseline creatinine is 1.22 IV hydration Avoid nephrotoxic agents Trend      Advance Care Planning:   Code Status: Prior Full  Consults: None  Family Communication: Patient at bedside  Severity of Illness: The appropriate patient status for this patient is INPATIENT. Inpatient status is judged to be reasonable and necessary in order to provide the required intensity of service to ensure the patient's safety. The patient's presenting symptoms, physical exam findings, and initial radiographic and laboratory data in the context of their chronic comorbidities is felt to place them at high risk for further clinical deterioration. Furthermore, it is not anticipated that the patient will be medically stable for discharge from the hospital within 2 midnights of admission.   * I certify that at the point of admission it is my clinical judgment that the patient will require inpatient hospital care spanning beyond 2 midnights from the point of admission due to high intensity of service, high risk for further deterioration and high frequency of surveillance required.*  Author: Reva Bores, MD 07/12/2023 8:47 PM  For on call review [email or web address].

## 2023-07-12 NOTE — Assessment & Plan Note (Signed)
 Continue statin.

## 2023-07-12 NOTE — ED Triage Notes (Signed)
 Pt [facility] from [facility] for [facility], productive cough w/clear mucous, feverx2d. PT states highest fever was 102. EMS gave albuterol 5mg  and atrovent 0.5mg . Pt has right sided deficits from prior stroke. Pt arrived w/duoneb still going. Pt is tachypneic and tachycardic.

## 2023-07-12 NOTE — Assessment & Plan Note (Signed)
 Continue PPI ?

## 2023-07-12 NOTE — ED Triage Notes (Signed)
Provider called to room

## 2023-07-12 NOTE — Assessment & Plan Note (Signed)
 Patient offered and declined nicotine patch at this time.  He reports smoking 1/2 pack/day.

## 2023-07-12 NOTE — Assessment & Plan Note (Signed)
 Oral antibiotics Inhaled steroids and LABA, LAMA Scheduled DuoNebs and every 2 hours as needed. Oxygen as needed

## 2023-07-12 NOTE — Assessment & Plan Note (Signed)
 Serum creatinine is 1.90 Baseline creatinine is 1.22 IV hydration Avoid nephrotoxic agents Trend

## 2023-07-12 NOTE — ED Notes (Signed)
 Placed pt on 2L 02 per Williamson.

## 2023-07-12 NOTE — Assessment & Plan Note (Signed)
Replete and trend 

## 2023-07-12 NOTE — Telephone Encounter (Signed)
 fyi

## 2023-07-12 NOTE — ED Provider Notes (Signed)
 [place] EMERGENCY DEPARTMENT AT [facility] Provider Note   CSN: [id number] Arrival date & time: 07/12/23  1459     History  Chief Complaint  Patient presents with   Shortness of Breath    Christopher [name] is a 58 y.o. male.  58 year old male with past medical history of hypertension, COPD, and CVA x 2 in the past presenting to the emergency department today with cough and shortness of breath.  The patient states has been on now for the past 3 to 4 days.  He states that he is coughing up a lot of sputum.  He denies any leg pain or swelling.  Denies a history of DVT or pulmonary embolism, recent surgeries, recent travel.  He denies any chest pain.  He came to the emergency department today for further evaluation due to the symptoms.   Shortness of Breath Associated symptoms: cough and fever        Home Medications Prior to Admission medications   Medication Sig Start Date End Date Taking? Authorizing Provider  acetaminophen (TYLENOL) 325 MG tablet Take 1-2 tablets (325-650 mg total) by mouth every 4 (four) hours as needed for mild pain. 09/21/21   Setzer, Lynnell Jude, PA-C  amLODipine (NORVASC) 10 MG tablet Take 1 tablet (10 mg total) by mouth daily. 06/05/23   Georganna Skeans, MD  aspirin EC 325 MG tablet Take 1 tablet (325 mg total) by mouth daily. 06/13/22   Micki Riley, MD  atorvastatin (LIPITOR) 20 MG tablet Take 1 tablet (20 mg total) by mouth daily. 06/05/23   Georganna Skeans, MD  Blood Pressure Monitoring (BLOOD PRESSURE CUFF) MISC Use to take blood pressure 2 times a day 01/23/22   Georganna Skeans, MD  lisinopril (ZESTRIL) 20 MG tablet Take 1 tablet (20 mg total) by mouth daily. 06/05/23   Georganna Skeans, MD  omeprazole (PRILOSEC) 20 MG capsule Take 1 capsule (20 mg total) by mouth daily. 06/05/23 12/02/23  Georganna Skeans, MD      Allergies    Patient has no known allergies.    Review of Systems   Review of Systems  Constitutional:  Positive for fever.   Respiratory:  Positive for cough and shortness of breath.   All other systems reviewed and are negative.   Physical Exam Updated Vital Signs BP 107/62   Pulse (!) 103   Temp 99.6 F (37.6 C) (Oral)   Resp (!) 25   Ht 5\' 9"  (1.753 m)   Wt 71.7 kg   SpO2 95%   BMI 23.34 kg/m  Physical Exam Vitals and nursing note reviewed.   Gen: Tachypneic with mild conversational dyspnea noted Eyes: PERRL, EOMI HEENT: no oropharyngeal swelling Neck: trachea midline, no meningismus Resp: Diminished with diffuse wheezes throughout all lung fields Card: Tachycardic, no murmurs Abd: nontender, nondistended Extremities: no calf tenderness, no edema Vascular: 2+ radial pulses bilaterally, 2+ DP pulses bilaterally Skin: no rashes Psyc: acting appropriately   ED Results / Procedures / Treatments   Labs (all labs ordered are listed, but only abnormal results are displayed) Labs Reviewed  RESP PANEL BY RT-PCR (RSV, FLU A&B, COVID)  RVPGX2 - Abnormal; Notable for the following components:      Result Value   Influenza A by PCR POSITIVE (*)    All other components within normal limits  COMPREHENSIVE METABOLIC PANEL - Abnormal; Notable for the following components:   Potassium 3.2 (*)    Glucose, Bld 166 (*)    Creatinine, Ser 1.90 (*)  Calcium 8.5 (*)    AST 54 (*)    GFR, Estimated 40 (*)    All other components within normal limits  CBC WITH DIFFERENTIAL/PLATELET - Abnormal; Notable for the following components:   WBC 11.6 (*)    Hemoglobin 12.4 (*)    HCT 38.5 (*)    Neutro Abs 9.6 (*)    All other components within normal limits  I-STAT CG4 LACTIC ACID, ED - Abnormal; Notable for the following components:   Lactic Acid, Venous 4.7 (*)    All other components within normal limits  I-STAT VENOUS BLOOD GAS, ED - Abnormal; Notable for the following components:   pCO2, Ven 42.9 (*)    pO2, Ven 46 (*)    Potassium 3.2 (*)    Calcium, Ion 1.04 (*)    All other components within  normal limits  I-STAT CG4 LACTIC ACID, ED - Abnormal; Notable for the following components:   Lactic Acid, Venous 4.2 (*)    All other components within normal limits  CULTURE, BLOOD (ROUTINE X 2)  CULTURE, BLOOD (ROUTINE X 2)  PROTIME-INR  URINALYSIS, W/ REFLEX TO CULTURE (INFECTION SUSPECTED)  BLOOD GAS, VENOUS  I-STAT VENOUS BLOOD GAS, ED    EKG None  Radiology DG Chest Portable 1 View Result Date: 07/12/2023 CLINICAL DATA:  Shortness of breath, cough, fever. EXAM: PORTABLE CHEST 1 VIEW COMPARISON:  October 21, 2006. FINDINGS: The heart size and mediastinal contours are within normal limits. Both lungs are clear. The visualized skeletal structures are unremarkable. IMPRESSION: No active disease. Electronically Signed   By: Lupita Raider M.D.   On: 07/12/2023 17:49    Procedures Procedures    Medications Ordered in ED Medications  lactated ringers infusion ( Intravenous New Bag/Given 07/12/23 1727)  acetaminophen (TYLENOL) tablet 650 mg (650 mg Oral Given 07/12/23 1542)  ipratropium-albuterol (DUONEB) 0.5-2.5 (3) MG/3ML nebulizer solution 3 mL (3 mLs Nebulization Given 07/12/23 1554)  ipratropium-albuterol (DUONEB) 0.5-2.5 (3) MG/3ML nebulizer solution 3 mL (3 mLs Nebulization Given 07/12/23 1554)  ipratropium-albuterol (DUONEB) 0.5-2.5 (3) MG/3ML nebulizer solution 3 mL (3 mLs Nebulization Given 07/12/23 1553)  methylPREDNISolone sodium succinate (SOLU-MEDROL) 125 mg/2 mL injection 125 mg (125 mg Intravenous Given 07/12/23 1548)  cefTRIAXone (ROCEPHIN) 2 g in sodium chloride 0.9 % 100 mL IVPB (0 g Intravenous Stopped 07/12/23 1626)  azithromycin (ZITHROMAX) 500 mg in sodium chloride 0.9 % 250 mL IVPB (0 mg Intravenous Stopped 07/12/23 1700)  lactated ringers bolus 1,000 mL (0 mLs Intravenous Stopped 07/12/23 1935)    And  lactated ringers bolus 1,000 mL (0 mLs Intravenous Stopped 07/12/23 1935)    And  lactated ringers bolus 250 mL (0 mLs Intravenous Stopped 07/12/23 2030)    ED Course/ Medical  Decision Making/ A&P Clinical Course as of 07/12/23 2045  Thu Jul 12, 2023  1715 The patient's lactic acid came back elevated at 4.7.  I was not notified initially when this result came out.  Sepsis fluids are initiated as soon as I realize this.  He has already been covered by broad-spectrum antibiotics. [AT]    Clinical Course User Index [AT] Durwin Glaze, MD                                 Medical Decision Making 58 year old male with past medical history of hypertension, COPD, and CVA in the past presenting to the emergency department today with cough and shortness of breath.  I will further evaluate patient here with a sepsis workup.  I will cover the patient with Rocephin and azithromycin although his symptoms may be due to viral etiology.  He does have COPD so I will cover him empirically with this.  Will also give him Solu-Medrol.  I will also give him antipyretics see if this helps with his hemodynamics as he is tachycardic here on arrival.  I will give him back-to-back DuoNebs here.  Will reevaluate but he would likely require admission.  The patient did have an elevated lactate.  This did improve slightly with IV fluid replacement.  The patient's flu test did ultimately come back positive.  The patient is stable and appears to be breathing much better after nebulizer treatments.  Calls placed to hospitalist service for admission.  CRITICAL CARE Performed by: Durwin Glaze   Total critical care time: 38 minutes  Critical care time was exclusive of separately billable procedures and treating other patients.  Critical care was necessary to treat or prevent imminent or life-threatening deterioration.  Critical care was time spent personally by me on the following activities: development of treatment plan with patient and/or surrogate as well as nursing, discussions with consultants, evaluation of patient's response to treatment, examination of patient, obtaining history from patient  or surrogate, ordering and performing treatments and interventions, ordering and review of laboratory studies, ordering and review of radiographic studies, pulse oximetry and re-evaluation of patient's condition.   Amount and/or Complexity of Data Reviewed Labs: ordered. Radiology: ordered.  Risk OTC drugs. Prescription drug management. Decision regarding hospitalization.           Final Clinical Impression(s) / ED Diagnoses Final diagnoses:  Influenza A  Hypoxia  Lactic acidosis    Rx / DC Orders ED Discharge Orders     None         Durwin Glaze, MD 07/12/23 2045

## 2023-07-12 NOTE — ED Notes (Signed)
 Patient is being discharged from the Urgent Care and sent to the Emergency Department via EMS . Per R. Reggie Pile, patient is in need of higher level of care due to SOB, Respiratory Distress. Patient is aware and verbalizes understanding of plan of care.  Vitals:   07/12/23 1412 07/12/23 1416  BP: (!) 170/70   Pulse: (!) 128   Resp: (!) 32   Temp: 98.9 F (37.2 C)   SpO2: 91% 94%

## 2023-07-12 NOTE — Assessment & Plan Note (Signed)
 Patient is soft blood pressures at this time. Holding his metoprolol and amlodipine.

## 2023-07-12 NOTE — Sepsis Progress Note (Signed)
 Code Sepsis protocol being monitored by [employer].

## 2023-07-12 NOTE — Assessment & Plan Note (Addendum)
 This was from sometime ago.  The patient reports no problems with ambulation at this time. On aspirin daily

## 2023-07-12 NOTE — Hospital Course (Signed)
 Patient 58 year old with history of HTN, COPD, CVA x 2, ongoing tobacco and alcohol use who reports 3-day history of fever to 102, cough, increasing shortness of breath.  He denies significant sick contacts.  On presentation to the ED he was noted to be hypoxic and required up to 5 L of oxygen.  He also tested positive for influenza A and was noted to be wheezing.  He had an elevated lactic acid at 4.7.  Sepsis protocol was initiated and he received significant IV fluid boluses and IV antibiotics.  He was given Solu-Medrol and antibiotics and nebulizers.

## 2023-07-12 NOTE — ED Provider Notes (Signed)
 [facility]    CSN: [id number] Arrival date & time: 07/12/23  1408      History   Chief Complaint Chief Complaint  Patient presents with   Fever   Shortness of Breath    HPI Christopher [name] is a 58 y.o. male.   Patient presents today for evaluation of shortness of breath, fever, and cough that started yesterday. He denies history of COPD but has had 2 strokes.   History limited due to patient's degree of respiratory distress and shortness of breath- worsened with talking.   The history is provided by the patient.  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   Anomia 05/05/2022   Hemiparesis affecting right side as late effect of cerebrovascular accident (CVA) (HCC) 05/05/2022   Acute ischemic left MCA stroke (HCC) 09/14/2021   Pressure injury of skin 09/11/2021   Stroke (HCC) 09/10/2021   Gastroesophageal reflux disease without esophagitis 08/24/2021   History of CVA (cerebrovascular accident) 08/11/2021   Dyslipidemia 01/24/2021   Essential hypertension    Hyponatremia    Polysubstance abuse (HCC)    Cerebral thrombosis with cerebral infarction 01/11/2021   Acute CVA (cerebrovascular accident) (HCC) 01/11/2021   Right sided weakness 01/10/2021   AKI (acute kidney injury) (HCC) 01/10/2021   Tobacco abuse 01/10/2021   Cocaine abuse (HCC) 01/10/2021   Leukocytosis 01/10/2021   Cellulitis and abscess of upper arm and forearm 11/13/2012   Episodic tobacco abuse 11/13/2012    Past Surgical History:  Procedure Laterality Date   CLAVICLE SURGERY Right    fracture  repair   INGUINAL HERNIA REPAIR Right    age 47   rupture ulcer         Home Medications    Prior to Admission medications   Medication Sig Start Date End Date Taking? Authorizing Provider  acetaminophen (TYLENOL) 325 MG tablet Take 1-2 tablets (325-650 mg total) by mouth every 4 (four) hours as needed for mild pain. 09/21/21   Setzer, Lynnell Jude, PA-C  amLODipine (NORVASC) 10 MG tablet Take 1 tablet (10 mg total) by mouth daily. 06/05/23   Georganna Skeans, MD  aspirin EC 325 MG tablet Take 1 tablet (325 mg total) by mouth daily. 06/13/22   Micki Riley, MD  atorvastatin (LIPITOR) 20 MG tablet Take 1 tablet (20 mg total) by mouth daily. 06/05/23   Georganna Skeans, MD  Blood Pressure Monitoring (BLOOD PRESSURE CUFF) MISC Use to take blood pressure 2 times a day 01/23/22   Georganna Skeans, MD  lisinopril (ZESTRIL) 20 MG tablet Take 1 tablet (20 mg total) by mouth daily. 06/05/23   Georganna Skeans, MD  omeprazole (PRILOSEC) 20 MG capsule Take 1 capsule (20 mg total) by mouth daily. 06/05/23 12/02/23  Georganna Skeans, MD    Family History Family History  Problem Relation Age of Onset   Cancer Mother    Stroke Father    Colon cancer Neg Hx    Stomach cancer Neg Hx    Esophageal cancer Neg Hx     Social History Social  History   Tobacco Use   Smoking status: Every Day    Current packs/day: 0.50    Types: Cigarettes   Smokeless tobacco: Former  Building services engineer status: Never Used  Substance Use Topics   Alcohol use: Yes    Comment: 2-3 "sparks" a day   Drug use: Not Currently    Types: "Crack" cocaine    Comment: "only every once in awhile"     Allergies   Patient has no known allergies.   Review of Systems Review of Systems  Constitutional:  Positive for fever.  HENT:  Positive for congestion.   Eyes:  Negative for discharge and redness.  Respiratory:  Positive for cough and shortness of breath.      Physical Exam Triage Vital Signs ED Triage Vitals  Encounter Vitals  Group     BP 07/12/23 1412 (!) 170/70     Systolic BP Percentile --      Diastolic BP Percentile --      Pulse Rate 07/12/23 1412 (!) 128     Resp 07/12/23 1412 (!) 32     Temp 07/12/23 1412 98.9 F (37.2 C)     Temp Source 07/12/23 1412 Oral     SpO2 07/12/23 1412 91 %     Weight 07/12/23 1415 158 lb 1.1 oz (71.7 kg)     Height 07/12/23 1415 5\' 9"  (1.753 m)     Head Circumference --      Peak Flow --      Pain Score 07/12/23 1415 0     Pain Loc --      Pain Education --      Exclude from Growth Chart --    No data found.  Updated Vital Signs BP (!) 170/70 (BP Location: Left Arm)   Pulse (!) 123   Temp 98.9 F (37.2 C) (Oral)   Resp (!) 34   Ht 5\' 9"  (1.753 m)   Wt 158 lb 1.1 oz (71.7 kg)   SpO2 91%   BMI 23.34 kg/m   Visual Acuity Right Eye Distance:   Left Eye Distance:   Bilateral Distance:    Right Eye Near:   Left Eye Near:    Bilateral Near:     Physical Exam Vitals and nursing note reviewed.  Constitutional:      General: He is in acute distress.     Appearance: He is ill-appearing.     Comments: Significant respiratory distress- tachypnea, unable to speak a few words without taking breath  HENT:     Head: Normocephalic and atraumatic.     Nose: Congestion present.     Mouth/Throat:     Mouth: Mucous membranes are moist.  Eyes:     Conjunctiva/sclera: Conjunctivae normal.  Cardiovascular:     Rate and Rhythm: Tachycardia present.  Pulmonary:     Effort: Respiratory distress present.     Breath sounds: Wheezing and rhonchi present.     Comments: Breath sounds diminished throughout, scattered wheezes and rhonchi.  Neurological:     Mental Status: He is alert.  Psychiatric:        Mood and Affect: Mood normal.      UC Treatments / Results  Labs (all labs ordered are listed, but only abnormal results are displayed) Labs Reviewed - No data to display  EKG   Radiology No results found.  Procedures Procedures (including critical care  time)  Medications Ordered in UC Medications  ipratropium-albuterol (DUONEB) 0.5-2.5 (3) MG/3ML  nebulizer solution 3 mL (3 mLs Nebulization Given 07/12/23 1423)    Initial Impression / Assessment and Plan / UC Course  I have reviewed the triage vital signs and the nursing notes.  Pertinent labs & imaging results that were available during my care of the patient were reviewed by me and considered in my medical decision making (see chart for details).   EMS notified immediately of need for transport. Duoneb administered while awaiting transport. [facility] arrive and transport patient from this UC facility.  Final Clinical Impressions(s) / UC Diagnoses   Final diagnoses:  Respiratory distress   Discharge Instructions   None    ED Prescriptions   None    PDMP not reviewed this encounter.   Tomi Bamberger, PA-C 07/12/23 [phone]

## 2023-07-12 NOTE — ED Triage Notes (Signed)
 Unable to evaluate/ask questions due to inability to breath/talk with sob.

## 2023-07-12 NOTE — Telephone Encounter (Signed)
  Communication  Temperature of 102.6. Cough, difficulty swallowing and eating, heavy breathing, sore throat.                Chief Complaint: Cough, sore throat, fever. Symptoms: Above Frequency: 2 days, Pertinent Negatives: Patient denies  Disposition: [] ED /[x] Urgent Care (no appt availability in office) / [] Appointment(In office/virtual)/ []  [facility]/ [] Home Care/ [] Refused Recommended Disposition /[] [facility]/ []  Follow-up with PCP Additional Notes: Pt. Unable to do VV or drive to another practice , will go to UC.  Reason for Disposition  Fever present > 3 days (72 hours)  Answer Assessment - Initial Assessment Questions 1. TEMPERATURE: "What is the most recent temperature?"  "How was it measured?"      102.6 2. ONSET: "When did the fever start?"      2 days 3. CHILLS: "Do you have chills?" If yes: "How bad are they?"  (e.g., none, mild, moderate, severe)   - NONE: no chills   - MILD: feeling cold   - MODERATE: feeling very cold, some shivering (feels better under a thick blanket)   - SEVERE: feeling extremely cold with shaking chills (general body shaking, rigors; even under a thick blanket)      Cills mild 4. OTHER SYMPTOMS: "Do you have any other symptoms besides the fever?"  (e.g., abdomen pain, cough, diarrhea, earache, headache, sore throat, urination pain)     Sore throat 5. CAUSE: If there are no symptoms, ask: "What do you think is causing the fever?"      No 6. CONTACTS: "Does anyone else in the family have an infection?"     No 7. TREATMENT: "What have you done so far to treat this fever?" (e.g., medications)     Tylenol 8. IMMUNOCOMPROMISE: "Do you have of the following: diabetes, HIV positive, splenectomy, cancer chemotherapy, chronic steroid treatment, transplant patient, etc."     No 9. PREGNANCY: "Is there any chance you are pregnant?" "When was your last menstrual period?"     N/a 10. TRAVEL: "Have you traveled out of the country  in the last month?" (e.g., travel history, exposures)       No  Protocols used: [facility]

## 2023-07-13 DIAGNOSIS — J441 Chronic obstructive pulmonary disease with (acute) exacerbation: Secondary | ICD-10-CM | POA: Diagnosis not present

## 2023-07-13 LAB — MAGNESIUM: Magnesium: 1.9 mg/dL (ref 1.7–2.4)

## 2023-07-13 LAB — BASIC METABOLIC PANEL
Anion gap: 7 (ref 5–15)
Anion gap: 11 (ref 5–15)
BUN: 20 mg/dL (ref 6–20)
BUN: 23 mg/dL — ABNORMAL HIGH (ref 6–20)
CO2: 25 mmol/L (ref 22–32)
CO2: 24 mmol/L (ref 22–32)
Calcium: 7.8 mg/dL — ABNORMAL LOW (ref 8.9–10.3)
Calcium: 8.4 mg/dL — ABNORMAL LOW (ref 8.9–10.3)
Chloride: 106 mmol/L (ref 98–111)
Chloride: 105 mmol/L (ref 98–111)
Creatinine, Ser: 1.44 mg/dL — ABNORMAL HIGH (ref 0.61–1.24)
Creatinine, Ser: 1.41 mg/dL — ABNORMAL HIGH (ref 0.61–1.24)
GFR, Estimated: 56 mL/min — ABNORMAL LOW (ref >60)
GFR, Estimated: 58 mL/min — ABNORMAL LOW (ref >60)
Glucose, Bld: 170 mg/dL — ABNORMAL HIGH (ref 70–99)
Glucose, Bld: 145 mg/dL — ABNORMAL HIGH (ref 70–99)
Potassium: 4 mmol/L (ref 3.5–5.1)
Potassium: 4 mmol/L (ref 3.5–5.1)
Sodium: 138 mmol/L (ref 135–145)
Sodium: 140 mmol/L (ref 135–145)

## 2023-07-13 LAB — CBC
HCT: 30.5 % — ABNORMAL LOW (ref 39.0–52.0)
Hemoglobin: 10.1 g/dL — ABNORMAL LOW (ref 13.0–17.0)
MCH: 28.6 pg (ref 26.0–34.0)
MCHC: 33.1 g/dL (ref 30.0–36.0)
MCV: 86.4 fL (ref 80.0–100.0)
Platelets: 155 10*3/uL (ref 150–400)
RBC: 3.53 MIL/uL — ABNORMAL LOW (ref 4.22–5.81)
RDW: 13 % (ref 11.5–15.5)
WBC: 7.6 10*3/uL (ref 4.0–10.5)
nRBC: 0 % (ref 0.0–0.2)

## 2023-07-13 LAB — HEMOGLOBIN A1C
Hgb A1c MFr Bld: 5.4 % (ref 4.8–5.6)
Mean Plasma Glucose: 108.28 mg/dL

## 2023-07-13 LAB — CBG MONITORING, ED: Glucose-Capillary: 138 mg/dL — ABNORMAL HIGH (ref 70–99)

## 2023-07-13 LAB — BLOOD GAS, VENOUS
Acid-Base Excess: 3.5 mmol/L — ABNORMAL HIGH (ref 0.0–2.0)
Bicarbonate: 29.1 mmol/L — ABNORMAL HIGH (ref 20.0–28.0)
O2 Saturation: 86.6 %
Patient temperature: 37
pCO2, Ven: 47 mmHg (ref 44–60)
pH, Ven: 7.4 (ref 7.25–7.43)
pO2, Ven: 52 mmHg — ABNORMAL HIGH (ref 32–45)

## 2023-07-13 LAB — PROCALCITONIN: Procalcitonin: 0.57 ng/mL

## 2023-07-13 LAB — BRAIN NATRIURETIC PEPTIDE: B Natriuretic Peptide: 114.9 pg/mL — ABNORMAL HIGH (ref 0.0–100.0)

## 2023-07-13 LAB — TSH: TSH: 0.471 u[IU]/mL (ref 0.350–4.500)

## 2023-07-13 LAB — HIV ANTIBODY (ROUTINE TESTING W REFLEX): HIV Screen 4th Generation wRfx: NONREACTIVE

## 2023-07-13 LAB — GLUCOSE, CAPILLARY
Glucose-Capillary: 117 mg/dL — ABNORMAL HIGH (ref 70–99)
Glucose-Capillary: 115 mg/dL — ABNORMAL HIGH (ref 70–99)

## 2023-07-13 MED ORDER — ARFORMOTEROL TARTRATE 15 MCG/2ML IN NEBU
15.0000 ug | INHALATION_SOLUTION | Freq: Two times a day (BID) | RESPIRATORY_TRACT | Status: DC
  Administered 2023-07-13 – 2023-07-24 (×21): 15 ug via RESPIRATORY_TRACT
  Filled 2023-07-13 (×21): qty 2

## 2023-07-13 MED ORDER — GUAIFENESIN ER 600 MG PO TB12
1200.0000 mg | ORAL_TABLET | Freq: Two times a day (BID) | ORAL | Status: DC
  Administered 2023-07-13 – 2023-07-24 (×23): 1200 mg via ORAL
  Filled 2023-07-13 (×23): qty 2

## 2023-07-13 MED ORDER — AMLODIPINE BESYLATE 10 MG PO TABS: 10.0000 mg | ORAL_TABLET | Freq: Every day | ORAL | Status: DC

## 2023-07-13 MED ORDER — OSELTAMIVIR PHOSPHATE 75 MG PO CAPS
75.0000 mg | ORAL_CAPSULE | Freq: Once | ORAL | Status: AC
  Administered 2023-07-13: 75 mg via ORAL
  Filled 2023-07-13: qty 1

## 2023-07-13 MED ORDER — METOPROLOL TARTRATE 5 MG/5ML IV SOLN
5.0000 mg | Freq: Once | INTRAVENOUS | Status: AC
  Administered 2023-07-13: 5 mg via INTRAVENOUS
  Filled 2023-07-13: qty 5

## 2023-07-13 MED ORDER — OSELTAMIVIR PHOSPHATE 30 MG PO CAPS
30.0000 mg | ORAL_CAPSULE | Freq: Two times a day (BID) | ORAL | Status: DC
  Administered 2023-07-14 – 2023-07-15 (×3): 30 mg via ORAL
  Filled 2023-07-13 (×3): qty 1

## 2023-07-13 MED ORDER — SODIUM CHLORIDE 0.9 % IV SOLN
2.0000 g | INTRAVENOUS | Status: AC
  Administered 2023-07-13 – 2023-07-16 (×4): 2 g via INTRAVENOUS
  Filled 2023-07-13 (×4): qty 20

## 2023-07-13 MED ORDER — BUDESONIDE 0.25 MG/2ML IN SUSP
0.2500 mg | Freq: Two times a day (BID) | RESPIRATORY_TRACT | Status: DC
  Administered 2023-07-13 – 2023-07-24 (×21): 0.25 mg via RESPIRATORY_TRACT
  Filled 2023-07-13 (×21): qty 2

## 2023-07-13 NOTE — ED Notes (Signed)
 Pt back in bed.

## 2023-07-13 NOTE — Plan of Care (Signed)
   Problem: Education: Goal: Ability to describe self-care measures that may prevent or decrease complications (Diabetes Survival Skills Education) will improve Outcome: Progressing

## 2023-07-13 NOTE — Discharge Instructions (Signed)

## 2023-07-13 NOTE — Progress Notes (Signed)
 PROGRESS NOTE    Christopher [name]  QMV:[id number] DOB: Sep 26, 1965 DOA: 07/12/2023 PCP: Georganna Skeans, MD   Brief Narrative: 58 year old with past medical history significant for hypertension, COPD, CVA x 2, ongoing tobacco and alcohol use, reports 3 days history of fever 102, cough, increased shortness of breath.  Evaluation in the ED he was noted to be hypoxic requiring up to 5 L of oxygen.  Positive for influenza.  Admitted for COPD exacerbation and influenza   Assessment & Plan:   Principal Problem:   COPD exacerbation (HCC) Active Problems:   AKI (acute kidney injury) (HCC)   Tobacco abuse   Essential hypertension   Dyslipidemia   Gastroesophageal reflux disease without esophagitis   Hemiparesis affecting right side as late effect of cerebrovascular accident (CVA) (HCC)   Hypokalemia  1-Acute hypoxic respiratory failure Acute Hypoxic resp failure currently on 3 L sat 91 COPD exacerbation Chest x-ray: No active disease. Change Breo to [employer] and Pulmicort Continue with DuoNebs Continue with prednisone With IV ceftriaxone and doxycycline Scheduled guaifenesin Flutter valve ordered.    Influenza A positive, acute viral illness Start Tamiflu  Severe  Sepsis:  Present with fever 101, tachycardia 131, tachypnea respiration 37, the setting of influenza A -Blood cultures -Received IV fluids and IV antibiotics -Tamiflu  Hypokalemia; replete orally   Hemiparesis affecting right side history of CVA Continue aspirin and Lipitor  GERD: PPI   Dyslipidemia:  Continue Lipitor  HTN: Norvasc and lisinopril blood pressure soft  Tobacco Abuse.  Counseling.   AKI Present with a creatinine of 1.9 Previous creatinine 1.3--- 0.2     Estimated body mass index is 23.34 kg/m as calculated from the following:   Height as of this encounter: 5\' 9"  (1.753 m).   Weight as of this encounter: 71.7 kg.   DVT prophylaxis: Heparin  Code Status: full code Family  Communication: Disposition Plan:  Status is: Inpatient Remains inpatient appropriate because: management of COPD, Influenza.     Consultants:  none  Procedures:  none  Antimicrobials:    Subjective: He is alert, report breathing ok, better than yesterday, report productive cough.   Objective: Vitals:   07/13/23 0120 07/13/23 0130 07/13/23 0430 07/13/23 0508  BP:  107/87 114/69   Pulse:  94 (!) 43   Resp:  20 (!) 26   Temp: 98.9 F (37.2 C)   98.8 F (37.1 C)  TempSrc: Oral   Oral  SpO2:  93% 91%   Weight:      Height:        Intake/Output Summary (Last 24 hours) at 07/13/2023 0802 Last data filed at 07/12/2023 2030 Gross per 24 hour  Intake 2605.98 ml  Output --  Net 2605.98 ml   Filed Weights   07/12/23 1507  Weight: 71.7 kg    Examination:  General exam: Appears calm and comfortable  Respiratory system: BL ronchus. Respiratory effort normal. Cardiovascular system: S1 & S2 heard, RRR.  Gastrointestinal system: Abdomen is nondistended, soft and nontender. No organomegaly or masses felt. Normal bowel sounds heard. Central nervous system: Alert and oriented Extremities: Symmetric 5 x 5 power.   Data Reviewed: I have personally reviewed following labs and imaging studies  CBC: Recent Labs  Lab 07/12/23 1513 07/12/23 1539 07/13/23 0343  WBC 11.6*  --  7.6  NEUTROABS 9.6*  --   --   HGB 12.4* 13.3 10.1*  HCT 38.5* 39.0 30.5*  MCV 86.9  --  86.4  PLT 202  --  155  Basic Metabolic Panel: Recent Labs  Lab 07/12/23 1513 07/12/23 1539 07/13/23 0343  NA 137 139 138  K 3.2* 3.2* 4.0  CL 100  --  106  CO2 22  --  25  GLUCOSE 166*  --  170*  BUN 18  --  20  CREATININE 1.90*  --  1.44*  CALCIUM 8.5*  --  7.8*   GFR: Estimated Creatinine Clearance: 55.9 mL/min (A) (by C-G formula based on SCr of 1.44 mg/dL (H)). Liver Function Tests: Recent Labs  Lab 07/12/23 1513  AST 54*  ALT 21  ALKPHOS 118  BILITOT 0.4  PROT 7.5  ALBUMIN 3.9   No  results for input(s): "LIPASE", "AMYLASE" in the last 168 hours. No results for input(s): "AMMONIA" in the last 168 hours. Coagulation Profile: Recent Labs  Lab 07/12/23 1513  INR 1.0   Cardiac Enzymes: No results for input(s): "CKTOTAL", "CKMB", "CKMBINDEX", "TROPONINI" in the last 168 hours. BNP (last 3 results) No results for input(s): "PROBNP" in the last 8760 hours. HbA1C: Recent Labs    07/13/23 0343  HGBA1C 5.4   CBG: Recent Labs  Lab 07/12/23 2236  GLUCAP 196*   Lipid Profile: No results for input(s): "CHOL", "HDL", "LDLCALC", "TRIG", "CHOLHDL", "LDLDIRECT" in the last 72 hours. Thyroid Function Tests: Recent Labs    07/13/23 0343  TSH 0.471   Anemia Panel: No results for input(s): "VITAMINB12", "FOLATE", "FERRITIN", "TIBC", "IRON", "RETICCTPCT" in the last 72 hours. Sepsis Labs: Recent Labs  Lab 07/12/23 1617 07/12/23 1723 07/13/23 0343  PROCALCITON  --   --  0.57  LATICACIDVEN 4.7* 4.2*  --     Recent Results (from the past 240 hours)  Resp panel by RT-PCR (RSV, Flu A&B, Covid) Anterior Nasal Swab     Status: Abnormal   Collection Time: 07/12/23  6:53 PM   Specimen: Anterior Nasal Swab  Result Value Ref Range Status   SARS Coronavirus 2 by RT PCR NEGATIVE NEGATIVE Final   Influenza A by PCR POSITIVE (A) NEGATIVE Final   Influenza B by PCR NEGATIVE NEGATIVE Final    Comment: (NOTE) The Xpert Xpress SARS-CoV-2/FLU/RSV plus assay is intended as an aid in the diagnosis of influenza from Nasopharyngeal swab specimens and should not be used as a sole basis for treatment. Nasal washings and aspirates are unacceptable for Xpert Xpress SARS-CoV-2/FLU/RSV testing.  Fact Sheet for Patients: BloggerCourse.com  Fact Sheet for Healthcare Providers: SeriousBroker.it  This test is not yet approved or cleared by the [place] FDA and has been authorized for detection and/or diagnosis of SARS-CoV-2 by FDA  under an Emergency Use Authorization (EUA). This EUA will remain in effect (meaning this test can be used) for the duration of the COVID-19 declaration under Section 564(b)(1) of the Act, 21 U.S.C. section 360bbb-3(b)(1), unless the authorization is terminated or revoked.     Resp Syncytial Virus by PCR NEGATIVE NEGATIVE Final    Comment: (NOTE) Fact Sheet for Patients: BloggerCourse.com  Fact Sheet for Healthcare Providers: SeriousBroker.it  This test is not yet approved or cleared by the [place] FDA and has been authorized for detection and/or diagnosis of SARS-CoV-2 by FDA under an Emergency Use Authorization (EUA). This EUA will remain in effect (meaning this test can be used) for the duration of the COVID-19 declaration under Section 564(b)(1) of the Act, 21 U.S.C. section 360bbb-3(b)(1), unless the authorization is terminated or revoked.  Performed at [facility], [address], [place], [place] [address]  Radiology Studies: DG Chest Portable 1 View Result Date: 07/12/2023 CLINICAL DATA:  Shortness of breath, cough, fever. EXAM: PORTABLE CHEST 1 VIEW COMPARISON:  October 21, 2006. FINDINGS: The heart size and mediastinal contours are within normal limits. Both lungs are clear. The visualized skeletal structures are unremarkable. IMPRESSION: No active disease. Electronically Signed   By: Lupita Raider M.D.   On: 07/12/2023 17:49        Scheduled Meds:  albuterol  2.5 mg Nebulization Q6H   aspirin EC  325 mg Oral Daily   atorvastatin  20 mg Oral QHS   doxycycline  100 mg Oral Q12H   fluticasone furoate-vilanterol  1 puff Inhalation Daily   folic acid  1 mg Oral Daily   heparin  5,000 Units Subcutaneous Q8H   insulin aspart  0-9 Units Subcutaneous TID WC   ipratropium-albuterol  3 mL Nebulization Q6H   multivitamin with minerals  1 tablet Oral Daily   pantoprazole  40 mg Oral QHS   potassium  chloride  20 mEq Oral BID   predniSONE  40 mg Oral Q breakfast   thiamine  100 mg Oral Daily   Or   thiamine  100 mg Intravenous Daily   umeclidinium bromide  1 puff Inhalation Daily   Continuous Infusions:  lactated ringers 150 mL/hr at 07/12/23 1727     LOS: 1 day    Time spent: 35 minutes    Layanna Charo A Demeco Ducksworth, MD [facility]   If 7PM-7AM, please contact night-coverage [email or web address]  07/13/2023, 8:02 AM

## 2023-07-13 NOTE — Progress Notes (Signed)
 PHARMACY NOTE:  ANTIMICROBIAL RENAL DOSAGE ADJUSTMENT  Current antimicrobial regimen includes a mismatch between antimicrobial dosage and estimated renal function.  As per policy approved by the Pharmacy & Therapeutics and Medical Executive Committees, the antimicrobial dosage will be adjusted accordingly.  Current antimicrobial dosage:  oseltamivir 75mg  BID  Indication: influenza  Renal Function:  Estimated Creatinine Clearance: 55.9 mL/min (A) (by C-G formula based on SCr of 1.44 mg/dL (H)). []      On intermittent HD, scheduled: []      On CRRT    Antimicrobial dosage has been changed to:  75mg  x1 then 30mg  BID   Additional comments:   Thank you for allowing pharmacy to be a part of this patient's care.  Leander Rams, [facility] 07/13/2023 5:07 PM

## 2023-07-13 NOTE — ED Notes (Signed)
 Pt up to bedside commode

## 2023-07-13 NOTE — Progress Notes (Signed)
 CSW added substance abuse resources to patient's AVS.  Edwin Dada, MSW, LCSW Transitions of Care  Clinical Social Worker II [phone]

## 2023-07-14 DIAGNOSIS — J101 Influenza due to other identified influenza virus with other respiratory manifestations: Secondary | ICD-10-CM | POA: Diagnosis not present

## 2023-07-14 DIAGNOSIS — N179 Acute kidney failure, unspecified: Secondary | ICD-10-CM

## 2023-07-14 DIAGNOSIS — J441 Chronic obstructive pulmonary disease with (acute) exacerbation: Secondary | ICD-10-CM | POA: Diagnosis not present

## 2023-07-14 DIAGNOSIS — J9601 Acute respiratory failure with hypoxia: Secondary | ICD-10-CM | POA: Diagnosis not present

## 2023-07-14 LAB — GLUCOSE, CAPILLARY
Glucose-Capillary: 103 mg/dL — ABNORMAL HIGH (ref 70–99)
Glucose-Capillary: 148 mg/dL — ABNORMAL HIGH (ref 70–99)
Glucose-Capillary: 151 mg/dL — ABNORMAL HIGH (ref 70–99)
Glucose-Capillary: 170 mg/dL — ABNORMAL HIGH (ref 70–99)

## 2023-07-14 MED ORDER — METHYLPREDNISOLONE SODIUM SUCC 40 MG IJ SOLR
40.0000 mg | Freq: Two times a day (BID) | INTRAMUSCULAR | Status: DC
  Administered 2023-07-14 – 2023-07-19 (×10): 40 mg via INTRAVENOUS
  Filled 2023-07-14 (×10): qty 1

## 2023-07-14 NOTE — Progress Notes (Signed)
   07/14/23 1934  Vitals  Temp 98.3 F (36.8 C)  Temp Source Oral  BP 122/82  MAP (mmHg) 91  BP Location Left Arm  BP Method Automatic  Patient Position (if appropriate) Sitting  Pulse Rate 96  Pulse Rate Source Monitor  ECG Heart Rate (!) 105  Resp (!) 22  Level of Consciousness  Level of Consciousness Alert  MEWS COLOR  MEWS Score Color Yellow  Oxygen Therapy  SpO2 90 %  O2 Device Nasal Cannula  O2 Flow Rate (L/min) 6 L/min  MEWS Score  MEWS Temp 0  MEWS Systolic 0  MEWS Pulse 1  MEWS RR 1  MEWS LOC 0  MEWS Score 2

## 2023-07-14 NOTE — Plan of Care (Signed)
  Problem: Coping: Goal: Ability to adjust to condition or change in health will improve Outcome: Progressing   Problem: Fluid Volume: Goal: Ability to maintain a balanced intake and output will improve Outcome: Progressing   Problem: Health Behavior/Discharge Planning: Goal: Ability to identify and utilize available resources and services will improve Outcome: Progressing Goal: Ability to manage health-related needs will improve Outcome: Progressing   Problem: Metabolic: Goal: Ability to maintain appropriate glucose levels will improve Outcome: Progressing   Problem: Nutritional: Goal: Maintenance of adequate nutrition will improve Outcome: Progressing Goal: Progress toward achieving an optimal weight will improve Outcome: Progressing   Problem: Skin Integrity: Goal: Risk for impaired skin integrity will decrease Outcome: Progressing   Problem: Tissue Perfusion: Goal: Adequacy of tissue perfusion will improve Outcome: Progressing   Problem: Education: Goal: Knowledge of General Education information will improve Description: Including pain rating scale, medication(s)/side effects and non-pharmacologic comfort measures Outcome: Progressing

## 2023-07-14 NOTE — Progress Notes (Signed)
 [facility] PROGRESS NOTE   Christopher [name] ZOX:[id number] DOB: Sep 10, 1965 DOA: 07/12/2023  PCP: Georganna Skeans, MD  Brief History: 58 year old with past medical history significant for hypertension, COPD, CVA x 2, ongoing tobacco and alcohol use, presented with fever cough shortness of breath.  Found to be positive for influenza.  Hospitalized for further management.     Consultants: None  Procedures: None    Subjective/Interval History: Patient mentions that he is feeling slightly better.  No significant cough.  Still short of breath.  No chest pain.  No nausea or vomiting.    Assessment/Plan:  Acute hypoxic respiratory failure COPD exacerbation Likely triggered by influenza.  He does have significant smoking history. Currently on 6 L of oxygen by nasal cannula with saturations in the early 90s.  Considering his significant history of smoking would I will write a saturation anywhere between 88 to 92%. He is noted to be on Brovana and budesonide nebulizer.  On Incruse Ellipta as well. On doxycycline and ceftriaxone as well for possible secondary bacterial infection.  Procalcitonin was 0.57. Still has wheezing.  Noted to be on prednisone.  Will change him back to Solu-Medrol and give him a higher dose at least for the next 2 to 3 days.  May need home oxygen at discharge.   Influenza A positive, acute viral illness/severe sepsis Present with fever 101, tachycardia 131, tachypnea respiration 37, the setting of influenza A Continue with Tamiflu.  Still febrile. Follow-up on blood cultures.  AKI Presented with a creatinine of 1.9.  Improved.  Continue to monitor.  Monitor urine output.  Avoid nephrotoxic agents.     Hypokalemia Supplemented    Hemiparesis affecting right side history of CVA Stable.  Continue aspirin and Lipitor   Dyslipidemia:  Continue Lipitor   Essential hypertension  Norvasc and lisinopril on hold due to soft blood pressure and AKI.    Tobacco Abuse Counseled.  DVT Prophylaxis: Subcutaneous heparin Code Status: Full code Family Communication: Discussed with patient Disposition Plan: Home fully return home when improved.  Status is: Inpatient Remains inpatient appropriate because: IV antibiotics      Medications: Scheduled:  arformoterol  15 mcg Nebulization BID   aspirin EC  325 mg Oral Daily   atorvastatin  20 mg Oral QHS   budesonide (PULMICORT) nebulizer solution  0.25 mg Nebulization BID   doxycycline  100 mg Oral Q12H   folic acid  1 mg Oral Daily   guaiFENesin  1,200 mg Oral BID   heparin  5,000 Units Subcutaneous Q8H   insulin aspart  0-9 Units Subcutaneous TID WC   ipratropium-albuterol  3 mL Nebulization Q6H   multivitamin with minerals  1 tablet Oral Daily   oseltamivir  30 mg Oral BID   pantoprazole  40 mg Oral QHS   potassium chloride  20 mEq Oral BID   predniSONE  40 mg Oral Q breakfast   thiamine  100 mg Oral Daily   umeclidinium bromide  1 puff Inhalation Daily   Continuous:  cefTRIAXone (ROCEPHIN)  IV 2 g (07/14/23 0923)   WJX:BJYNWGNFAOZHY **OR** acetaminophen, albuterol, LORazepam **OR** LORazepam, metoprolol tartrate, ondansetron **OR** ondansetron (ZOFRAN) IV, oxyCODONE, polyethylene glycol  Antibiotics: Anti-infectives (From admission, onward)    Start     Dose/Rate Route Frequency Ordered Stop   07/14/23 1000  oseltamivir (TAMIFLU) capsule 30 mg        30 mg Oral 2 times daily 07/13/23 1707 07/18/23 2159   07/13/23 1715  oseltamivir (TAMIFLU) capsule  75 mg        75 mg Oral Once 07/13/23 1657 07/13/23 1735   07/13/23 0915  cefTRIAXone (ROCEPHIN) 2 g in sodium chloride 0.9 % 100 mL IVPB        2 g 200 mL/hr over 30 Minutes Intravenous Every 24 hours 07/13/23 0900 07/17/23 0914   07/12/23 2200  doxycycline (VIBRA-TABS) tablet 100 mg        100 mg Oral Every 12 hours 07/12/23 2124 07/17/23 2159   07/12/23 1530  cefTRIAXone (ROCEPHIN) 2 g in sodium chloride 0.9 % 100 mL IVPB         2 g 200 mL/hr over 30 Minutes Intravenous  Once 07/12/23 1524 07/12/23 1626   07/12/23 1530  azithromycin (ZITHROMAX) 500 mg in sodium chloride 0.9 % 250 mL IVPB        500 mg 250 mL/hr over 60 Minutes Intravenous  Once 07/12/23 1524 07/12/23 1700       Objective:  Vital Signs  Vitals:   07/14/23 0821 07/14/23 0822 07/14/23 0918 07/14/23 0930  BP:      Pulse: (!) 104     Resp: (!) 28  (!) 24   Temp:    98.8 F (37.1 C)  TempSrc:    Oral  SpO2: (!) 89% 93% 90%   Weight:      Height:        Intake/Output Summary (Last 24 hours) at 07/14/2023 1014 Last data filed at 07/14/2023 0918 Gross per 24 hour  Intake 100 ml  Output 700 ml  Net -600 ml   Filed Weights   07/12/23 1507  Weight: 71.7 kg    General appearance: Awake alert.  In no distress Resp: Mildly tachypneic without use of accessory muscles.  Wheezing heard bilaterally.  Few crackles at the bases.  No rhonchi. Cardio: S1-S2 is normal regular.  No S3-S4.  No rubs murmurs or bruit GI: Abdomen is soft.  Nontender nondistended.  Bowel sounds are present normal.  No masses organomegaly Extremities: No edema.  Full range of motion of lower extremities. Neurologic: Alert and oriented x3.  No focal neurological deficits.    Lab Results:  Data Reviewed: I have personally reviewed following labs and reports of the imaging studies  CBC: Recent Labs  Lab 07/12/23 1513 07/12/23 1539 07/13/23 0343  WBC 11.6*  --  7.6  NEUTROABS 9.6*  --   --   HGB 12.4* 13.3 10.1*  HCT 38.5* 39.0 30.5*  MCV 86.9  --  86.4  PLT 202  --  155    Basic Metabolic Panel: Recent Labs  Lab 07/12/23 1513 07/12/23 1539 07/13/23 0343 07/13/23 2311  NA 137 139 138 140  K 3.2* 3.2* 4.0 4.0  CL 100  --  106 105  CO2 22  --  25 24  GLUCOSE 166*  --  170* 145*  BUN 18  --  20 23*  CREATININE 1.90*  --  1.44* 1.41*  CALCIUM 8.5*  --  7.8* 8.4*  MG  --   --   --  1.9    GFR: Estimated Creatinine Clearance: 57.1 mL/min (A) (by  C-G formula based on SCr of 1.41 mg/dL (H)).  Liver Function Tests: Recent Labs  Lab 07/12/23 1513  AST 54*  ALT 21  ALKPHOS 118  BILITOT 0.4  PROT 7.5  ALBUMIN 3.9    Coagulation Profile: Recent Labs  Lab 07/12/23 1513  INR 1.0    HbA1C: Recent Labs  07/13/23 0343  HGBA1C 5.4    CBG: Recent Labs  Lab 07/12/23 2236 07/13/23 0836 07/13/23 1205 07/13/23 1718 07/14/23 0915  GLUCAP 196* 138* 117* 115* 103*    Thyroid Function Tests: Recent Labs    07/13/23 0343  TSH 0.471    Recent Results (from the past 240 hours)  Culture, blood (Routine x 2)     Status: None (Preliminary result)   Collection Time: 07/12/23  3:18 PM   Specimen: BLOOD  Result Value Ref Range Status   Specimen Description BLOOD LEFT ANTECUBITAL  Final   Special Requests   Final    BOTTLES DRAWN AEROBIC AND ANAEROBIC Blood Culture results may not be optimal due to an inadequate volume of blood received in culture bottles   Culture   Final    NO GROWTH 2 DAYS Performed at [facility], [address] [place], [place] [address]    Report Status PENDING  Incomplete  Culture, blood (Routine x 2)     Status: None (Preliminary result)   Collection Time: 07/12/23  3:32 PM   Specimen: BLOOD LEFT HAND  Result Value Ref Range Status   Specimen Description BLOOD LEFT HAND  Final   Special Requests   Final    BOTTLES DRAWN AEROBIC AND ANAEROBIC Blood Culture results may not be optimal due to an inadequate volume of blood received in culture bottles   Culture   Final    NO GROWTH 2 DAYS Performed at [facility], [address] [place], [place] [address]    Report Status PENDING  Incomplete  Resp panel by RT-PCR (RSV, Flu A&B, Covid) Anterior Nasal Swab     Status: Abnormal   Collection Time: 07/12/23  6:53 PM   Specimen: Anterior Nasal Swab  Result Value Ref Range Status   SARS Coronavirus 2 by RT PCR NEGATIVE NEGATIVE Final   Influenza A by PCR POSITIVE (A) NEGATIVE Final    Influenza B by PCR NEGATIVE NEGATIVE Final    Comment: (NOTE) The Xpert Xpress SARS-CoV-2/FLU/RSV plus assay is intended as an aid in the diagnosis of influenza from Nasopharyngeal swab specimens and should not be used as a sole basis for treatment. Nasal washings and aspirates are unacceptable for Xpert Xpress SARS-CoV-2/FLU/RSV testing.  Fact Sheet for Patients: BloggerCourse.com  Fact Sheet for Healthcare Providers: SeriousBroker.it  This test is not yet approved or cleared by the [place] FDA and has been authorized for detection and/or diagnosis of SARS-CoV-2 by FDA under an Emergency Use Authorization (EUA). This EUA will remain in effect (meaning this test can be used) for the duration of the COVID-19 declaration under Section 564(b)(1) of the Act, 21 U.S.C. section 360bbb-3(b)(1), unless the authorization is terminated or revoked.     Resp Syncytial Virus by PCR NEGATIVE NEGATIVE Final    Comment: (NOTE) Fact Sheet for Patients: BloggerCourse.com  Fact Sheet for Healthcare Providers: SeriousBroker.it  This test is not yet approved or cleared by the [place] FDA and has been authorized for detection and/or diagnosis of SARS-CoV-2 by FDA under an Emergency Use Authorization (EUA). This EUA will remain in effect (meaning this test can be used) for the duration of the COVID-19 declaration under Section 564(b)(1) of the Act, 21 U.S.C. section 360bbb-3(b)(1), unless the authorization is terminated or revoked.  Performed at [facility], [address], [place], [place] [address]       Radiology Studies: DG Chest Portable 1 View Result Date: 07/12/2023 CLINICAL DATA:  Shortness of breath, cough, fever. EXAM: PORTABLE CHEST 1 VIEW COMPARISON:  October 21, 2006. FINDINGS: The heart size and mediastinal contours are within normal limits. Both lungs are clear. The  visualized skeletal structures are unremarkable. IMPRESSION: No active disease. Electronically Signed   By: Lupita Raider M.D.   On: 07/12/2023 17:49       LOS: 2 days   Christopher [name] Rito Ehrlich  [facility] Pager on [email or web address]  07/14/2023, 10:14 AM

## 2023-07-14 NOTE — Progress Notes (Signed)
 Patients sats  sustaining between 86-89 with 6LNC  So, respiratory therapist change to 15L HFNC but pt doesn't have any breathing difficulty. Will monitor the pt and wean down the oxygen accordingly.

## 2023-07-14 NOTE — Plan of Care (Signed)
  Problem: Coping: Goal: Ability to adjust to condition or change in health will improve Outcome: Progressing   Problem: Clinical Measurements: Goal: Respiratory complications will improve Outcome: Progressing   Problem: Activity: Goal: Risk for activity intolerance will decrease Outcome: Progressing   Problem: Safety: Goal: Ability to remain free from injury will improve Outcome: Progressing   

## 2023-07-15 ENCOUNTER — Inpatient Hospital Stay (HOSPITAL_COMMUNITY)

## 2023-07-15 DIAGNOSIS — J441 Chronic obstructive pulmonary disease with (acute) exacerbation: Secondary | ICD-10-CM | POA: Diagnosis not present

## 2023-07-15 LAB — CBC
HCT: 34.2 % — ABNORMAL LOW (ref 39.0–52.0)
Hemoglobin: 11.3 g/dL — ABNORMAL LOW (ref 13.0–17.0)
MCH: 28.5 pg (ref 26.0–34.0)
MCHC: 33 g/dL (ref 30.0–36.0)
MCV: 86.1 fL (ref 80.0–100.0)
Platelets: 182 10*3/uL (ref 150–400)
RBC: 3.97 MIL/uL — ABNORMAL LOW (ref 4.22–5.81)
RDW: 13.1 % (ref 11.5–15.5)
WBC: 6.3 10*3/uL (ref 4.0–10.5)
nRBC: 0 % (ref 0.0–0.2)

## 2023-07-15 LAB — BASIC METABOLIC PANEL
Anion gap: 10 (ref 5–15)
BUN: 16 mg/dL (ref 6–20)
CO2: 22 mmol/L (ref 22–32)
Calcium: 8.3 mg/dL — ABNORMAL LOW (ref 8.9–10.3)
Chloride: 107 mmol/L (ref 98–111)
Creatinine, Ser: 1.22 mg/dL (ref 0.61–1.24)
GFR, Estimated: >60 mL/min (ref >60)
Glucose, Bld: 135 mg/dL — ABNORMAL HIGH (ref 70–99)
Potassium: 4.5 mmol/L (ref 3.5–5.1)
Sodium: 139 mmol/L (ref 135–145)

## 2023-07-15 LAB — C-REACTIVE PROTEIN: CRP: 7.5 mg/dL — ABNORMAL HIGH (ref <1.0)

## 2023-07-15 LAB — GLUCOSE, CAPILLARY
Glucose-Capillary: 153 mg/dL — ABNORMAL HIGH (ref 70–99)
Glucose-Capillary: 188 mg/dL — ABNORMAL HIGH (ref 70–99)
Glucose-Capillary: 147 mg/dL — ABNORMAL HIGH (ref 70–99)
Glucose-Capillary: 154 mg/dL — ABNORMAL HIGH (ref 70–99)

## 2023-07-15 LAB — MAGNESIUM: Magnesium: 2.2 mg/dL (ref 1.7–2.4)

## 2023-07-15 LAB — PROCALCITONIN: Procalcitonin: 0.18 ng/mL

## 2023-07-15 LAB — PHOSPHORUS: Phosphorus: 3.1 mg/dL (ref 2.5–4.6)

## 2023-07-15 LAB — BRAIN NATRIURETIC PEPTIDE: B Natriuretic Peptide: 213.2 pg/mL — ABNORMAL HIGH (ref 0.0–100.0)

## 2023-07-15 MED ORDER — OSELTAMIVIR PHOSPHATE 75 MG PO CAPS
75.0000 mg | ORAL_CAPSULE | Freq: Two times a day (BID) | ORAL | Status: AC
  Administered 2023-07-15 – 2023-07-18 (×6): 75 mg via ORAL
  Filled 2023-07-15 (×7): qty 1

## 2023-07-15 MED ORDER — FUROSEMIDE 10 MG/ML IJ SOLN
20.0000 mg | Freq: Once | INTRAMUSCULAR | Status: AC
  Administered 2023-07-15: 20 mg via INTRAVENOUS
  Filled 2023-07-15: qty 2

## 2023-07-15 NOTE — Progress Notes (Signed)
 [facility] PROGRESS NOTE   Christopher [name] JYN:[id number] DOB: [birth year] DOA: 07/12/2023  PCP: Georganna Skeans, MD  Brief History: 58 year old with past medical history significant for hypertension, COPD, CVA x 2, ongoing tobacco and alcohol use, presented with fever cough shortness of breath.  Found to be positive for influenza.  Hospitalized for further management.     Consultants: None  Procedures: None    Subjective/Interval History:  Patient in bed, appears comfortable, denies any headache, no fever, no chest pain or pressure, no shortness of breath , no abdominal pain. No new focal weakness.     Assessment/Plan:  Acute hypoxic respiratory failure COPD exacerbation Likely triggered by influenza.  He does have significant smoking history. Currently on 9 L of oxygen by nasal cannula with saturations in the early 90s.  Considering his significant history of smoking would I will write a saturation anywhere between 88 to 92%. He is noted to be on Brovana and budesonide nebulizer.  On Incruse Ellipta as well. On doxycycline and ceftriaxone as well for possible secondary bacterial infection.  Procalcitonin was 0.57. Continue IV Solu-Medrol, wheezing improving, clinically better, chest x-ray shows some right lower lobe infiltrate which could be from #2 below but will request speech therapy to rule out aspiration.   Advance activity, rest encouraged to sit in chair use I-S and flutter valve for pulmonary toiletry.  Gradually titrate down oxygen as tolerated. May need home oxygen at discharge.     Influenza A positive, acute viral illness/severe sepsis Present with fever 101, tachycardia 131, tachypnea respiration 37, the setting of influenza A Continue with Tamiflu.  Still febrile. Follow-up on blood cultures.  AKI Presented with a creatinine of 1.9.  Improved.  Continue to monitor.  Monitor urine output.  Avoid nephrotoxic agents.     Hypokalemia Supplemented     Hemiparesis affecting right side history of CVA Stable.  Continue aspirin and Lipitor   Dyslipidemia:  Continue Lipitor   Essential hypertension  Norvasc and lisinopril on hold due to soft blood pressure and AKI.   Tobacco Abuse Counseled.  DVT Prophylaxis: Subcutaneous heparin Code Status: Full code Family Communication: Discussed with patient Disposition Plan: Home fully return home when improved.  Status is: Inpatient Remains inpatient appropriate because: IV antibiotics    Objective:  Vital Signs  Vitals:   07/15/23 0800 07/15/23 0828 07/15/23 0831 07/15/23 0838  BP: 129/73     Pulse: 90 90 83 84  Resp: (!) 23 (!) 23 (!) 29 (!) 24  Temp: 97.9 F (36.6 C)     TempSrc: Oral     SpO2: (!) 86% (!) 86% 90% (!) 89%  Weight:      Height:        Intake/Output Summary (Last 24 hours) at 07/15/2023 1146 Last data filed at 07/15/2023 1000 Gross per 24 hour  Intake 360 ml  Output 1525 ml  Net -1165 ml   Filed Weights   07/12/23 1507  Weight: 71.7 kg   Exam   Awake Alert, No new F.N deficits, Normal affect [place].AT,PERRAL Supple Neck, No JVD,   Symmetrical Chest wall movement, Good air movement bilaterally, CTAB RRR,No Gallops, Rubs or new Murmurs,  +ve B.Sounds, Abd Soft, No tenderness,   No Cyanosis, Clubbing or edema    Lab Results:  Data Reviewed: I have personally reviewed following labs and reports of the imaging studies   Data Review:   Inpatient Medications  Scheduled Meds:  arformoterol  15 mcg Nebulization BID  aspirin EC  325 mg Oral Daily   atorvastatin  20 mg Oral QHS   budesonide (PULMICORT) nebulizer solution  0.25 mg Nebulization BID   doxycycline  100 mg Oral Q12H   folic acid  1 mg Oral Daily   furosemide  20 mg Intravenous Once   guaiFENesin  1,200 mg Oral BID   heparin  5,000 Units Subcutaneous Q8H   insulin aspart  0-9 Units Subcutaneous TID WC   ipratropium-albuterol  3 mL Nebulization Q6H   methylPREDNISolone (SOLU-MEDROL)  injection  40 mg Intravenous Q12H   multivitamin with minerals  1 tablet Oral Daily   oseltamivir  75 mg Oral BID   pantoprazole  40 mg Oral QHS   thiamine  100 mg Oral Daily   umeclidinium bromide  1 puff Inhalation Daily   Continuous Infusions:  cefTRIAXone (ROCEPHIN)  IV Stopped (07/15/23 1021)   PRN Meds:.acetaminophen **OR** acetaminophen, albuterol, LORazepam **OR** LORazepam, metoprolol tartrate, ondansetron **OR** ondansetron (ZOFRAN) IV, oxyCODONE, polyethylene glycol  DVT Prophylaxis  heparin injection 5,000 Units Start: 07/12/23 2200    Recent Labs  Lab 07/12/23 1513 07/12/23 1539 07/13/23 0343 07/15/23 0555  WBC 11.6*  --  7.6 6.3  HGB 12.4* 13.3 10.1* 11.3*  HCT 38.5* 39.0 30.5* 34.2*  PLT 202  --  155 182  MCV 86.9  --  86.4 86.1  MCH 28.0  --  28.6 28.5  MCHC 32.2  --  33.1 33.0  RDW 12.9  --  13.0 13.1  LYMPHSABS 1.3  --   --   --   MONOABS 0.5  --   --   --   EOSABS 0.0  --   --   --   BASOSABS 0.0  --   --   --     Recent Labs  Lab 07/12/23 1513 07/12/23 1539 07/12/23 1617 07/12/23 1723 07/13/23 0343 07/13/23 2311 07/15/23 0555  NA 137 139  --   --  138 140 139  K 3.2* 3.2*  --   --  4.0 4.0 4.5  CL 100  --   --   --  106 105 107  CO2 22  --   --   --  25 24 22   ANIONGAP 15  --   --   --  7 11 10   GLUCOSE 166*  --   --   --  170* 145* 135*  BUN 18  --   --   --  20 23* 16  CREATININE 1.90*  --   --   --  1.44* 1.41* 1.22  AST 54*  --   --   --   --   --   --   ALT 21  --   --   --   --   --   --   ALKPHOS 118  --   --   --   --   --   --   BILITOT 0.4  --   --   --   --   --   --   ALBUMIN 3.9  --   --   --   --   --   --   CRP  --   --   --   --   --   --  7.5*  PROCALCITON  --   --   --   --  0.57  --  0.18  LATICACIDVEN  --   --  4.7* 4.2*  --   --   --  INR 1.0  --   --   --   --   --   --   TSH  --   --   --   --  0.471  --   --   HGBA1C  --   --   --   --  5.4  --   --   BNP  --   --   --   --  114.9*  --  213.2*  MG  --   --    --   --   --  1.9 2.2  PHOS  --   --   --   --   --   --  3.1  CALCIUM 8.5*  --   --   --  7.8* 8.4* 8.3*      Recent Labs  Lab 07/12/23 1513 07/12/23 1617 07/12/23 1723 07/13/23 0343 07/13/23 2311 07/15/23 0555  CRP  --   --   --   --   --  7.5*  PROCALCITON  --   --   --  0.57  --  0.18  LATICACIDVEN  --  4.7* 4.2*  --   --   --   INR 1.0  --   --   --   --   --   TSH  --   --   --  0.471  --   --   HGBA1C  --   --   --  5.4  --   --   BNP  --   --   --  114.9*  --  213.2*  MG  --   --   --   --  1.9 2.2  CALCIUM 8.5*  --   --  7.8* 8.4* 8.3*    --------------------------------------------------------------------------------------------------------------- Lab Results  Component Value Date   CHOL 131 06/13/2022   HDL 44 06/13/2022   LDLCALC 55 06/13/2022   TRIG 198 (H) 06/13/2022   CHOLHDL 3.0 06/13/2022    Lab Results  Component Value Date   HGBA1C 5.4 07/13/2023   Recent Labs    07/13/23 0343  TSH 0.471   No results for input(s): "VITAMINB12", "FOLATE", "FERRITIN", "TIBC", "IRON", "RETICCTPCT" in the last 72 hours. ------------------------------------------------------------------------------------------------------------------ Cardiac Enzymes No results for input(s): "CKMB", "TROPONINI", "MYOGLOBIN" in the last 168 hours.  Invalid input(s): "CK"  Micro Results Recent Results (from the past 240 hours)  Culture, blood (Routine x 2)     Status: None (Preliminary result)   Collection Time: 07/12/23  3:18 PM   Specimen: BLOOD  Result Value Ref Range Status   Specimen Description BLOOD LEFT ANTECUBITAL  Final   Special Requests   Final    BOTTLES DRAWN AEROBIC AND ANAEROBIC Blood Culture results may not be optimal due to an inadequate volume of blood received in culture bottles   Culture   Final    NO GROWTH 3 DAYS Performed at [facility], [address], [place], [place] [address]    Report Status PENDING  Incomplete  Culture, blood (Routine x  2)     Status: None (Preliminary result)   Collection Time: 07/12/23  3:32 PM   Specimen: BLOOD LEFT HAND  Result Value Ref Range Status   Specimen Description BLOOD LEFT HAND  Final   Special Requests   Final    BOTTLES DRAWN AEROBIC AND ANAEROBIC Blood Culture results may not be optimal due to an inadequate volume of blood received in culture bottles  Culture   Final    NO GROWTH 3 DAYS Performed at [facility], [address], [place], [place] [address]    Report Status PENDING  Incomplete  Resp panel by RT-PCR (RSV, Flu A&B, Covid) Anterior Nasal Swab     Status: Abnormal   Collection Time: 07/12/23  6:53 PM   Specimen: Anterior Nasal Swab  Result Value Ref Range Status   SARS Coronavirus 2 by RT PCR NEGATIVE NEGATIVE Final   Influenza A by PCR POSITIVE (A) NEGATIVE Final   Influenza B by PCR NEGATIVE NEGATIVE Final    Comment: (NOTE) The Xpert Xpress SARS-CoV-2/FLU/RSV plus assay is intended as an aid in the diagnosis of influenza from Nasopharyngeal swab specimens and should not be used as a sole basis for treatment. Nasal washings and aspirates are unacceptable for Xpert Xpress SARS-CoV-2/FLU/RSV testing.  Fact Sheet for Patients: BloggerCourse.com  Fact Sheet for Healthcare Providers: SeriousBroker.it  This test is not yet approved or cleared by the [place] FDA and has been authorized for detection and/or diagnosis of SARS-CoV-2 by FDA under an Emergency Use Authorization (EUA). This EUA will remain in effect (meaning this test can be used) for the duration of the COVID-19 declaration under Section 564(b)(1) of the Act, 21 U.S.C. section 360bbb-3(b)(1), unless the authorization is terminated or revoked.     Resp Syncytial Virus by PCR NEGATIVE NEGATIVE Final    Comment: (NOTE) Fact Sheet for Patients: BloggerCourse.com  Fact Sheet for Healthcare  Providers: SeriousBroker.it  This test is not yet approved or cleared by the [place] FDA and has been authorized for detection and/or diagnosis of SARS-CoV-2 by FDA under an Emergency Use Authorization (EUA). This EUA will remain in effect (meaning this test can be used) for the duration of the COVID-19 declaration under Section 564(b)(1) of the Act, 21 U.S.C. section 360bbb-3(b)(1), unless the authorization is terminated or revoked.  Performed at [facility], [address] [place], [place] [address]     Radiology Studies: DG Chest Port 1 View Result Date: 07/15/2023 CLINICAL DATA:  Shortness of breath. EXAM: PORTABLE CHEST 1 VIEW COMPARISON:  07/12/2023 FINDINGS: Interval development of patchy airspace opacity at the right base suspicious for pneumonia. Given rapid interval appearance, aspiration would be a consideration. Streaky density at the left base is probably atelectatic. Cardiopericardial silhouette is at upper limits of normal for size. No acute bony abnormality. Telemetry leads overlie the chest. IMPRESSION: New airspace disease at the right base compatible with aspiration and/or pneumonia. Streaky density at the left base suggest atelectasis. Electronically Signed   By: Kennith Center M.D.   On: 07/15/2023 07:57     LOS: 3 days   Signature  -    Susa Raring M.D on 07/15/2023 at 11:46 AM   -  To page go to [email or web address]

## 2023-07-15 NOTE — Plan of Care (Signed)
  Problem: Coping: Goal: Ability to adjust to condition or change in health will improve Outcome: Progressing   Problem: Clinical Measurements: Goal: Will remain free from infection Outcome: Progressing Goal: Respiratory complications will improve Outcome: Progressing   Problem: Activity: Goal: Risk for activity intolerance will decrease Outcome: Progressing   Problem: Coping: Goal: Level of anxiety will decrease Outcome: Progressing

## 2023-07-15 NOTE — Evaluation (Signed)
 Physical Therapy Evaluation Patient Details Name: Christopher [name] MRN: [id number] DOB: [birth year] Today's Date: 07/15/2023  History of Present Illness  Pt is 58 yo presenting to [facility] on 3/6 due to fever, cough and shortness of breath; found to be positive for flu. PMH includes HTN, HLD, CVA, PUD, L MCA infarct, hx of cocaine abuse.  Clinical Impression  Pt is presenting below baseline level of functioning. Currently pt is supervision to CGA for sit to stand and gait. O2 sats dropped down to 84% during gait. Difficulty getting O2 sats to come back up on increased O2 with good pleth line. Terminated gait at that point. Pt demonstrating shortness of breathe. Due to pt current functional status, home set up and available assistance at home recommending skilled physical therapy services 3x/week in order to address strength, balance and functional mobility to decrease risk for falls, injury and re-hospitalization.           If plan is discharge home, recommend the following: Help with stairs or ramp for entrance;Assist for transportation;Assistance with cooking/housework     Equipment Recommendations None recommended by PT     Functional Status Assessment Patient has had a recent decline in their functional status and demonstrates the ability to make significant improvements in function in a reasonable and predictable amount of time.     Precautions / Restrictions Precautions Precautions: Fall Recall of Precautions/Restrictions: Intact Restrictions Weight Bearing Restrictions Per Provider Order: No      Mobility  Bed Mobility     General bed mobility comments: Pt in recliner on arrival and departure    Transfers Overall transfer level: Needs assistance Equipment used: None Transfers: Sit to/from Stand Sit to Stand: Supervision           General transfer comment: supervision for safety. Wide BOS slightly unstable on standing. Used L hand to stabilize self on recliner arm     Ambulation/Gait Ambulation/Gait assistance: Supervision, Contact guard assist Gait Distance (Feet): 20 Feet Assistive device: None Gait Pattern/deviations: Step-through pattern, Decreased stride length Gait velocity: decreased Gait velocity interpretation: <1.31 ft/sec, indicative of household ambulator   General Gait Details: needs assist managing O2 wire. Pt had 1x Loss of balance with CGA to maintain balance from physical therapy and use of stepping strategy to prevent fall. Pt O2 sats dropped to 84% on 10 L o2 via [employer] during gait. Discontinued activity at that time.      Balance Overall balance assessment: Needs assistance Sitting-balance support: Single extremity supported Sitting balance-Leahy Scale: Good     Standing balance support: No upper extremity supported, During functional activity Standing balance-Leahy Scale: Poor Standing balance comment: LOB requiring CGA to maintain balance going straight to stepping strategy to prevent fall.           Pertinent Vitals/Pain Pain Assessment Pain Assessment: No/denies pain    Home Living Family/patient expects to be discharged to:: Private residence Living Arrangements: Other relatives (lives with sister) Available Help at Discharge: Available 24 hours/day;Family Type of Home: Mobile home Home Access: Stairs to enter Entrance Stairs-Rails: Right Entrance Stairs-Number of Steps: 2   Home Layout: One level Home Equipment: Cane - single Librarian, academic (2 wheels)      Prior Function Prior Level of Function : Independent/Modified Independent             Mobility Comments: pt reports independence with mobility ambulating without an AD. Denies falls ADLs Comments: sister helps with cooking and cleaning.     Extremity/Trunk Assessment  Upper Extremity Assessment Upper Extremity Assessment: Overall WFL for tasks assessed    Lower Extremity Assessment Lower Extremity Assessment: Overall WFL for tasks  assessed    Cervical / Trunk Assessment Cervical / Trunk Assessment: Normal  Communication   Communication Communication: Impaired Factors Affecting Communication: Difficulty expressing self    Cognition Arousal: Alert Behavior During Therapy: WFL for tasks assessed/performed   PT - Cognitive impairments: No apparent impairments     Following commands: Intact       Cueing Cueing Techniques: Verbal cues     General Comments General comments (skin integrity, edema, etc.): Difficulty keeping up O2 sats on 10L O2 via [place] during gait. Dropped to 84%. Difficulty coming back up; pt placed on 15 L for short period of time to increase O2 sats taking ~3 min to get to 88%. Pt then turned back down to 10L        Assessment/Plan    PT Assessment Patient needs continued PT services  PT Problem List Cardiopulmonary status limiting activity;Decreased mobility;Decreased activity tolerance       PT Treatment Interventions DME instruction;Therapeutic activities;Gait training;Therapeutic exercise;Stair training;Balance training;Functional mobility training;Patient/family education    PT Goals (Current goals can be found in the Care Plan section)  Acute Rehab PT Goals Patient Stated Goal: to return home and get better PT Goal Formulation: With patient Time For Goal Achievement: 07/29/23 Potential to Achieve Goals: Good    Frequency Min 2X/week        AM-PAC PT "6 Clicks" Mobility  Outcome Measure Help needed turning from your back to your side while in a flat bed without using bedrails?: A Little Help needed moving from lying on your back to sitting on the side of a flat bed without using bedrails?: A Little Help needed moving to and from a bed to a chair (including a wheelchair)?: A Little Help needed standing up from a chair using your arms (e.g., wheelchair or bedside chair)?: A Little Help needed to walk in hospital room?: A Little Help needed climbing 3-5 steps with a railing?  : A Little 6 Click Score: 18    End of Session Equipment Utilized During Treatment: Gait belt;Oxygen Activity Tolerance: Treatment limited secondary to medical complications (Comment) Patient left: in chair;with call bell/phone within reach   PT Visit Diagnosis: Other abnormalities of gait and mobility (R26.89)    Time: 1610-9604 PT Time Calculation (min) (ACUTE ONLY): 14 min   Charges:   PT Evaluation $PT Eval Low Complexity: 1 Low   PT General Charges $$ ACUTE PT VISIT: 1 Visit       Harrel Carina, DPT, CLT  Acute Rehabilitation Services Office: [phone] (Secure chat preferred)   Claudia Desanctis 07/15/2023, 4:09 PM

## 2023-07-16 ENCOUNTER — Inpatient Hospital Stay (HOSPITAL_COMMUNITY)

## 2023-07-16 DIAGNOSIS — J9601 Acute respiratory failure with hypoxia: Secondary | ICD-10-CM | POA: Diagnosis not present

## 2023-07-16 DIAGNOSIS — R0609 Other forms of dyspnea: Secondary | ICD-10-CM | POA: Diagnosis not present

## 2023-07-16 DIAGNOSIS — J09X2 Influenza due to identified novel influenza A virus with other respiratory manifestations: Secondary | ICD-10-CM

## 2023-07-16 DIAGNOSIS — J441 Chronic obstructive pulmonary disease with (acute) exacerbation: Secondary | ICD-10-CM | POA: Diagnosis not present

## 2023-07-16 LAB — BASIC METABOLIC PANEL
Anion gap: 10 (ref 5–15)
BUN: 22 mg/dL — ABNORMAL HIGH (ref 6–20)
CO2: 22 mmol/L (ref 22–32)
Calcium: 8.6 mg/dL — ABNORMAL LOW (ref 8.9–10.3)
Chloride: 105 mmol/L (ref 98–111)
Creatinine, Ser: 1.22 mg/dL (ref 0.61–1.24)
GFR, Estimated: >60 mL/min (ref >60)
Glucose, Bld: 134 mg/dL — ABNORMAL HIGH (ref 70–99)
Potassium: 4.6 mmol/L (ref 3.5–5.1)
Sodium: 137 mmol/L (ref 135–145)

## 2023-07-16 LAB — ECHOCARDIOGRAM COMPLETE
AR max vel: 2.27 cm2
AV Area VTI: 2.1 cm2
AV Area mean vel: 1.9 cm2
AV Mean grad: 4 mmHg
AV Peak grad: 8 mmHg
Ao pk vel: 1.41 m/s
Area-P 1/2: 3.99 cm2
Height: 69 in
P 1/2 time: 496 ms
S' Lateral: 3.4 cm
Weight: 2529.12 [oz_av]

## 2023-07-16 LAB — RESPIRATORY PANEL BY PCR

## 2023-07-16 LAB — CBC WITH DIFFERENTIAL/PLATELET
Abs Immature Granulocytes: 0.02 10*3/uL (ref 0.00–0.07)
Basophils Absolute: 0 10*3/uL (ref 0.0–0.1)
Basophils Relative: 0 %
Eosinophils Absolute: 0.1 10*3/uL (ref 0.0–0.5)
Eosinophils Relative: 1 %
HCT: 35.9 % — ABNORMAL LOW (ref 39.0–52.0)
Hemoglobin: 11.7 g/dL — ABNORMAL LOW (ref 13.0–17.0)
Immature Granulocytes: 0 %
Lymphocytes Relative: 5 %
Lymphs Abs: 0.4 10*3/uL — ABNORMAL LOW (ref 0.7–4.0)
MCH: 28.1 pg (ref 26.0–34.0)
MCHC: 32.6 g/dL (ref 30.0–36.0)
MCV: 86.1 fL (ref 80.0–100.0)
Monocytes Absolute: 0.3 10*3/uL (ref 0.1–1.0)
Monocytes Relative: 4 %
Neutro Abs: 6.8 10*3/uL (ref 1.7–7.7)
Neutrophils Relative %: 90 %
Platelets: 192 10*3/uL (ref 150–400)
RBC: 4.17 MIL/uL — ABNORMAL LOW (ref 4.22–5.81)
RDW: 13.2 % (ref 11.5–15.5)
WBC: 7.6 10*3/uL (ref 4.0–10.5)
nRBC: 0 % (ref 0.0–0.2)

## 2023-07-16 LAB — GLUCOSE, CAPILLARY
Glucose-Capillary: 156 mg/dL — ABNORMAL HIGH (ref 70–99)
Glucose-Capillary: 137 mg/dL — ABNORMAL HIGH (ref 70–99)
Glucose-Capillary: 151 mg/dL — ABNORMAL HIGH (ref 70–99)
Glucose-Capillary: 154 mg/dL — ABNORMAL HIGH (ref 70–99)

## 2023-07-16 LAB — PROCALCITONIN: Procalcitonin: <0.1 ng/mL

## 2023-07-16 LAB — BRAIN NATRIURETIC PEPTIDE: B Natriuretic Peptide: 49.4 pg/mL (ref 0.0–100.0)

## 2023-07-16 LAB — C-REACTIVE PROTEIN: CRP: 3.1 mg/dL — ABNORMAL HIGH (ref <1.0)

## 2023-07-16 LAB — MAGNESIUM: Magnesium: 2 mg/dL (ref 1.7–2.4)

## 2023-07-16 LAB — MRSA NEXT GEN BY PCR, NASAL: MRSA by PCR Next Gen: NOT DETECTED

## 2023-07-16 MED ORDER — FUROSEMIDE 10 MG/ML IJ SOLN
20.0000 mg | Freq: Once | INTRAMUSCULAR | Status: AC
  Administered 2023-07-16: 20 mg via INTRAVENOUS
  Filled 2023-07-16: qty 2

## 2023-07-16 MED ORDER — VANCOMYCIN HCL 1500 MG/300ML IV SOLN
1500.0000 mg | INTRAVENOUS | Status: DC
  Administered 2023-07-16 – 2023-07-17 (×2): 1500 mg via INTRAVENOUS
  Filled 2023-07-16 (×2): qty 300

## 2023-07-16 MED ORDER — SODIUM CHLORIDE 0.9 % IV SOLN
2.0000 g | Freq: Three times a day (TID) | INTRAVENOUS | Status: DC
  Administered 2023-07-16 – 2023-07-18 (×6): 2 g via INTRAVENOUS
  Filled 2023-07-16 (×6): qty 12.5

## 2023-07-16 NOTE — Progress Notes (Signed)
 [facility] PROGRESS NOTE   Christopher [name] WJX:[id number] DOB: 1965/11/09 DOA: 07/12/2023  PCP: Georganna Skeans, MD  Brief History: 58 year old with past medical history significant for hypertension, COPD, CVA x 2, ongoing tobacco and alcohol use, presented with fever cough shortness of breath.  Found to be positive for influenza.  Hospitalized for further management.     Consultants: PCCM  Procedures: None  Subjective -  Patient in bed, appears comfortable, denies any headache, no fever, no chest pain or pressure, no shortness of breath , no abdominal pain. No new focal weakness.  Assessment/Plan:  Severe acute hypoxic respiratory failure in a patient with COPD, now with acute influenza A pneumonia.  Likely triggered by influenza.  He does have significant smoking history.  He is currently on 12 to 15 L of high flow oxygen, also has significant COPD history and long history of smoking.  Treated with Tamiflu for his influenza infection, steroids for COPD exacerbation, as needed IV Lasix, he has been continued on nebulizer treatments and supplemental oxygen, not much improvement in the last 2 days.  Still significant hypoxia, will involve pulmonary critical care as well.  He has been encouraged to sit in chair use I-S and flutter valve for pulmonary toiletry.  Continue to monitor.   SpO2: (!) 85 % O2 Flow Rate (L/min): 15 L/min   Influenza A positive, acute viral illness/severe sepsis Present with fever 101, tachycardia 131, tachypnea respiration 37, the setting of influenza A Continue with Tamiflu.  Still febrile. Follow-up on blood cultures.  AKI - Presented with a creatinine of 1.9.  Improved.  Continue to monitor.  Monitor urine output.  Avoid nephrotoxic agents.     Hypokalemia  Supplemented    Hemiparesis affecting right side history of CVA  - Stable.  Continue aspirin and Lipitor   Dyslipidemia:   Continue Lipitor   Essential hypertension Norvasc and lisinopril  on hold due to soft blood pressure and AKI.   Tobacco Abuse  Counseled.   DVT Prophylaxis: Subcutaneous heparin Code Status: Full code Family Communication: sister [name] [phone] - 07/16/23 Disposition Plan: Home fully return home when improved.  Status is: Inpatient Remains inpatient appropriate because: IV antibiotics    Objective:  Vital Signs  Vitals:   07/16/23 0825 07/16/23 0830 07/16/23 0835 07/16/23 0840  BP:    (!) 124/59  Pulse:      Resp: (!) 22 (!) 31 (!) 26 (!) 30  Temp:    98.6 F (37 C)  TempSrc:    Oral  SpO2:      Weight:      Height:        Intake/Output Summary (Last 24 hours) at 07/16/2023 1023 Last data filed at 07/16/2023 0543 Gross per 24 hour  Intake 360 ml  Output 1675 ml  Net -1315 ml   Filed Weights   07/12/23 1507  Weight: 71.7 kg   Exam   Awake Alert, No new F.N deficits, Normal affect [place].AT,PERRAL Supple Neck, No JVD,   Symmetrical Chest wall movement, Good air movement bilaterally, few coarse rales bilaterally  RRR,No Gallops, Rubs or new Murmurs,  +ve B.Sounds, Abd Soft, No tenderness,   No Cyanosis, Clubbing or edema    Lab Results:  Data Reviewed: I have personally reviewed following labs and reports of the imaging studies   Data Review:   Inpatient Medications  Scheduled Meds:  arformoterol  15 mcg Nebulization BID   aspirin EC  325 mg Oral Daily   atorvastatin  20 mg Oral QHS   budesonide (PULMICORT) nebulizer solution  0.25 mg Nebulization BID   doxycycline  100 mg Oral Q12H   folic acid  1 mg Oral Daily   furosemide  20 mg Intravenous Once   guaiFENesin  1,200 mg Oral BID   heparin  5,000 Units Subcutaneous Q8H   insulin aspart  0-9 Units Subcutaneous TID WC   ipratropium-albuterol  3 mL Nebulization Q6H   methylPREDNISolone (SOLU-MEDROL) injection  40 mg Intravenous Q12H   multivitamin with minerals  1 tablet Oral Daily   oseltamivir  75 mg Oral BID   pantoprazole  40 mg Oral QHS   thiamine  100 mg  Oral Daily   umeclidinium bromide  1 puff Inhalation Daily   Continuous Infusions:   PRN Meds:.acetaminophen **OR** acetaminophen, albuterol, metoprolol tartrate, ondansetron **OR** ondansetron (ZOFRAN) IV, oxyCODONE, polyethylene glycol  DVT Prophylaxis  heparin injection 5,000 Units Start: 07/12/23 2200    Recent Labs  Lab 07/12/23 1513 07/12/23 1539 07/13/23 0343 07/15/23 0555 07/16/23 0536  WBC 11.6*  --  7.6 6.3 7.6  HGB 12.4* 13.3 10.1* 11.3* 11.7*  HCT 38.5* 39.0 30.5* 34.2* 35.9*  PLT 202  --  155 182 192  MCV 86.9  --  86.4 86.1 86.1  MCH 28.0  --  28.6 28.5 28.1  MCHC 32.2  --  33.1 33.0 32.6  RDW 12.9  --  13.0 13.1 13.2  LYMPHSABS 1.3  --   --   --  0.4*  MONOABS 0.5  --   --   --  0.3  EOSABS 0.0  --   --   --  0.1  BASOSABS 0.0  --   --   --  0.0    Recent Labs  Lab 07/12/23 1513 07/12/23 1539 07/12/23 1617 07/12/23 1723 07/13/23 0343 07/13/23 2311 07/15/23 0555 07/16/23 0536  NA 137 139  --   --  138 140 139 137  K 3.2* 3.2*  --   --  4.0 4.0 4.5 4.6  CL 100  --   --   --  106 105 107 105  CO2 22  --   --   --  25 24 22 22   ANIONGAP 15  --   --   --  7 11 10 10   GLUCOSE 166*  --   --   --  170* 145* 135* 134*  BUN 18  --   --   --  20 23* 16 22*  CREATININE 1.90*  --   --   --  1.44* 1.41* 1.22 1.22  AST 54*  --   --   --   --   --   --   --   ALT 21  --   --   --   --   --   --   --   ALKPHOS 118  --   --   --   --   --   --   --   BILITOT 0.4  --   --   --   --   --   --   --   ALBUMIN 3.9  --   --   --   --   --   --   --   CRP  --   --   --   --   --   --  7.5* 3.1*  PROCALCITON  --   --   --   --  0.57  --  0.18 <0.10  LATICACIDVEN  --   --  4.7* 4.2*  --   --   --   --   INR 1.0  --   --   --   --   --   --   --   TSH  --   --   --   --  0.471  --   --   --   HGBA1C  --   --   --   --  5.4  --   --   --   BNP  --   --   --   --  114.9*  --  213.2* 49.4  MG  --   --   --   --   --  1.9 2.2 2.0  PHOS  --   --   --   --   --   --   3.1  --   CALCIUM 8.5*  --   --   --  7.8* 8.4* 8.3* 8.6*      Recent Labs  Lab 07/12/23 1513 07/12/23 1617 07/12/23 1723 07/13/23 0343 07/13/23 2311 07/15/23 0555 07/16/23 0536  CRP  --   --   --   --   --  7.5* 3.1*  PROCALCITON  --   --   --  0.57  --  0.18 <0.10  LATICACIDVEN  --  4.7* 4.2*  --   --   --   --   INR 1.0  --   --   --   --   --   --   TSH  --   --   --  0.471  --   --   --   HGBA1C  --   --   --  5.4  --   --   --   BNP  --   --   --  114.9*  --  213.2* 49.4  MG  --   --   --   --  1.9 2.2 2.0  CALCIUM 8.5*  --   --  7.8* 8.4* 8.3* 8.6*    --------------------------------------------------------------------------------------------------------------- Lab Results  Component Value Date   CHOL 131 06/13/2022   HDL 44 06/13/2022   LDLCALC 55 06/13/2022   TRIG 198 (H) 06/13/2022   CHOLHDL 3.0 06/13/2022    Lab Results  Component Value Date   HGBA1C 5.4 07/13/2023   No results for input(s): "TSH", "T4TOTAL", "FREET4", "T3FREE", "THYROIDAB" in the last 72 hours.  No results for input(s): "VITAMINB12", "FOLATE", "FERRITIN", "TIBC", "IRON", "RETICCTPCT" in the last 72 hours. ------------------------------------------------------------------------------------------------------------------ Cardiac Enzymes No results for input(s): "CKMB", "TROPONINI", "MYOGLOBIN" in the last 168 hours.  Invalid input(s): "CK"  Micro Results Recent Results (from the past 240 hours)  Culture, blood (Routine x 2)     Status: None (Preliminary result)   Collection Time: 07/12/23  3:18 PM   Specimen: BLOOD  Result Value Ref Range Status   Specimen Description BLOOD LEFT ANTECUBITAL  Final   Special Requests   Final    BOTTLES DRAWN AEROBIC AND ANAEROBIC Blood Culture results may not be optimal due to an inadequate volume of blood received in culture bottles   Culture   Final    NO GROWTH 4 DAYS Performed at [facility], [address] [place], [place] [address]     Report Status PENDING  Incomplete  Culture, blood (Routine x 2)     Status:  None (Preliminary result)   Collection Time: 07/12/23  3:32 PM   Specimen: BLOOD LEFT HAND  Result Value Ref Range Status   Specimen Description BLOOD LEFT HAND  Final   Special Requests   Final    BOTTLES DRAWN AEROBIC AND ANAEROBIC Blood Culture results may not be optimal due to an inadequate volume of blood received in culture bottles   Culture   Final    NO GROWTH 4 DAYS Performed at [facility], [address] [place], [place] [address]    Report Status PENDING  Incomplete  Resp panel by RT-PCR (RSV, Flu A&B, Covid) Anterior Nasal Swab     Status: Abnormal   Collection Time: 07/12/23  6:53 PM   Specimen: Anterior Nasal Swab  Result Value Ref Range Status   SARS Coronavirus 2 by RT PCR NEGATIVE NEGATIVE Final   Influenza A by PCR POSITIVE (A) NEGATIVE Final   Influenza B by PCR NEGATIVE NEGATIVE Final    Comment: (NOTE) The Xpert Xpress SARS-CoV-2/FLU/RSV plus assay is intended as an aid in the diagnosis of influenza from Nasopharyngeal swab specimens and should not be used as a sole basis for treatment. Nasal washings and aspirates are unacceptable for Xpert Xpress SARS-CoV-2/FLU/RSV testing.  Fact Sheet for Patients: BloggerCourse.com  Fact Sheet for Healthcare Providers: SeriousBroker.it  This test is not yet approved or cleared by the [place] FDA and has been authorized for detection and/or diagnosis of SARS-CoV-2 by FDA under an Emergency Use Authorization (EUA). This EUA will remain in effect (meaning this test can be used) for the duration of the COVID-19 declaration under Section 564(b)(1) of the Act, 21 U.S.C. section 360bbb-3(b)(1), unless the authorization is terminated or revoked.     Resp Syncytial Virus by PCR NEGATIVE NEGATIVE Final    Comment: (NOTE) Fact Sheet for  Patients: BloggerCourse.com  Fact Sheet for Healthcare Providers: SeriousBroker.it  This test is not yet approved or cleared by the [place] FDA and has been authorized for detection and/or diagnosis of SARS-CoV-2 by FDA under an Emergency Use Authorization (EUA). This EUA will remain in effect (meaning this test can be used) for the duration of the COVID-19 declaration under Section 564(b)(1) of the Act, 21 U.S.C. section 360bbb-3(b)(1), unless the authorization is terminated or revoked.  Performed at [facility], [address], [place], [place] [address]     Radiology Studies: DG Chest Port 1 View Result Date: 07/16/2023 CLINICAL DATA:  Shortness of breath, COPD and fever. EXAM: PORTABLE CHEST 1 VIEW COMPARISON:  07/15/2023 FINDINGS: Heart size and mediastinal contours are normal. Aortic atherosclerosis. Progressive multifocal opacities within the right upper and right lower lung. Atelectasis noted in the left base. Possible small pleural effusions with blunting of the costophrenic angles. Visualized osseous structures appear intact. IMPRESSION: 1. Progressive multifocal opacities within the right upper and right lower lung compatible with multifocal pneumonia. 2. Possible small pleural effusions. Electronically Signed   By: Signa Kell M.D.   On: 07/16/2023 06:31   DG Chest Port 1 View Result Date: 07/15/2023 CLINICAL DATA:  Shortness of breath. EXAM: PORTABLE CHEST 1 VIEW COMPARISON:  07/12/2023 FINDINGS: Interval development of patchy airspace opacity at the right base suspicious for pneumonia. Given rapid interval appearance, aspiration would be a consideration. Streaky density at the left base is probably atelectatic. Cardiopericardial silhouette is at upper limits of normal for size. No acute bony abnormality. Telemetry leads overlie the chest. IMPRESSION: New airspace disease at the right base compatible  with aspiration  and/or pneumonia. Streaky density at the left base suggest atelectasis. Electronically Signed   By: Kennith Center M.D.   On: 07/15/2023 07:57     LOS: 4 days   Signature  -    Susa Raring M.D on 07/16/2023 at 10:23 AM   -  To page go to [email or web address]

## 2023-07-16 NOTE — Progress Notes (Signed)
 PHARMACY ANTIBIOTIC CONSULT NOTE   Christopher [name] a 58 y.o. male admitted with Flu A/COPD exacerbation.  Pharmacy has been consulted for vancomycin/Cefepime dosing. 3/10 CXR with progressive multifocal opacities c/w multifocal PNA.   Patient currently on HHFNC 15L  3/10: Scr 1.22 (At baseline), WBC 7.6 - D #5 steroids  Vital Signs: afebrile, HR WNL, BP soft  Estimated Creatinine Clearance: 66 mL/min (by C-G formula based on SCr of 1.22 mg/dL).  Plan: START Cefepime 2g IV Q8H Vancomycin 1,500 mg IV Q24H (Scr used: 1.22, Vd used: 0.72, eAUC: 483) Monitor renal function, clinical status, de-escalation, C/S, levels as indicated   Allergies:  No Known Allergies  Filed Weights   07/12/23 1507  Weight: 71.7 kg (158 lb 1.1 oz)       Latest Ref Rng & Units 07/16/2023    5:36 AM 07/15/2023    5:55 AM 07/13/2023    3:43 AM  CBC  WBC 4.0 - 10.5 K/uL 7.6  6.3  7.6   Hemoglobin 13.0 - 17.0 g/dL 62.1  30.8  65.7   Hematocrit 39.0 - 52.0 % 35.9  34.2  30.5   Platelets 150 - 400 K/uL 192  182  155     Antibiotics Given (last 72 hours)     Date/Time Action Medication Dose Rate   07/13/23 1735 Given   oseltamivir (TAMIFLU) capsule 75 mg 75 mg    07/13/23 2122 Given   doxycycline (VIBRA-TABS) tablet 100 mg 100 mg    07/14/23 0923 Given   doxycycline (VIBRA-TABS) tablet 100 mg 100 mg    07/14/23 8469 Given   oseltamivir (TAMIFLU) capsule 30 mg 30 mg    07/14/23 6295 New Bag/Given   cefTRIAXone (ROCEPHIN) 2 g in sodium chloride 0.9 % 100 mL IVPB 2 g 200 mL/hr   07/14/23 2106 Given   oseltamivir (TAMIFLU) capsule 30 mg 30 mg    07/14/23 2107 Given   doxycycline (VIBRA-TABS) tablet 100 mg 100 mg    07/15/23 0830 Given   doxycycline (VIBRA-TABS) tablet 100 mg 100 mg    07/15/23 0830 Given   oseltamivir (TAMIFLU) capsule 30 mg 30 mg    07/15/23 0830 New Bag/Given   cefTRIAXone (ROCEPHIN) 2 g in sodium chloride 0.9 % 100 mL IVPB 2 g 200 mL/hr   07/15/23 2109 Given   doxycycline  (VIBRA-TABS) tablet 100 mg 100 mg    07/15/23 2109 Given   oseltamivir (TAMIFLU) capsule 75 mg 75 mg    07/16/23 2841 Given   doxycycline (VIBRA-TABS) tablet 100 mg 100 mg    07/16/23 3244 Given   oseltamivir (TAMIFLU) capsule 75 mg 75 mg    07/16/23 0102 New Bag/Given   cefTRIAXone (ROCEPHIN) 2 g in sodium chloride 0.9 % 100 mL IVPB 2 g 200 mL/hr       Antimicrobials this admission: CRO 3/6 > 3/10 Doxy 3/6 > 3/10 Azithro x1 3/6  Vanc 3/10>> Cefepime 3/10>>   Microbiology results: 3/6 Bcx: sent 3/6 4-plex PCR: Flu A +  3/10 MRSA PCR: sent   Thank you for allowing pharmacy to be a part of this patient's care.  Jani Gravel, PharmD Clinical Pharmacist  07/16/2023 1:15 PM

## 2023-07-16 NOTE — Progress Notes (Signed)
 RT placed patient on HHFNC due to patinet sats 89% on 15L HFNC. Patient tolerating well at this time. RT will continue to monitor.   07/16/23 1502  Therapy Vitals  Pulse Rate 84  Resp (!) 25  Patient Position (if appropriate) Lying  MEWS Score/Color  MEWS Score 1  MEWS Score Color Green  Respiratory Assessment  Assessment Type Assess only  Respiratory Pattern Tachypnea;Regular;Dyspnea with exertion  Chest Assessment Chest expansion symmetrical  Oxygen Therapy/Pulse Ox  O2 Device (S)  HHFNC  $ High Flow Nasal Cannula  Yes  O2 Therapy Oxygen humidified  Heater temperature 93.2 F (34 C)  O2 Flow Rate (L/min) 30 L/min  FiO2 (%) 60 %  SpO2 91 %

## 2023-07-16 NOTE — Plan of Care (Signed)
  Problem: Coping: Goal: Ability to adjust to condition or change in health will improve Outcome: Progressing   Problem: Clinical Measurements: Goal: Will remain free from infection Outcome: Progressing Goal: Respiratory complications will improve Outcome: Progressing   Problem: Coping: Goal: Level of anxiety will decrease Outcome: Progressing

## 2023-07-16 NOTE — TOC Progression Note (Signed)
 Transition of Care [facility]) - Progression Note    Patient Details  Name: Christopher [name] MRN: [id number] Date of Birth: 25-Jan-1966  Transition of Care [facility]) CM/SW Contact  Ronny Bacon, RN Phone Number: 07/16/2023, 4:01 PM  Clinical Narrative:   Pt requiring high oxygen requirement. PCCM consulted, if no improvement with current treatments, may go to ICU.         Expected Discharge Plan and Services                                               Social Determinants of Health (SDOH) Interventions SDOH Screenings   Food Insecurity: No Food Insecurity (07/13/2023)  Housing: Low Risk  (07/13/2023)  Transportation Needs: No Transportation Needs (07/13/2023)  Utilities: Not At Risk (07/13/2023)  Alcohol Screen: Medium Risk (08/16/2022)  Depression (PHQ2-9): Low Risk  (06/05/2023)  Financial Resource Strain: Medium Risk (08/16/2022)  Physical Activity: Sufficiently Active (08/16/2022)  Social Connections: Socially Isolated (08/16/2022)  Stress: No Stress Concern Present (02/15/2023)  Tobacco Use: High Risk (07/12/2023)  Health Literacy: Inadequate Health Literacy (02/15/2023)    Readmission Risk Interventions     No data to display

## 2023-07-16 NOTE — Consult Note (Signed)
 NAME:  Christopher [name], MRN:  [id number], DOB:  September 02, 1965, LOS: 4 ADMISSION DATE:  07/12/2023, CONSULTATION DATE:  3/10 REFERRING MD:  singh, CHIEF COMPLAINT:  resp failure    History of Present Illness:  This is a 58 year old male w/ hx as outlined below. Admitted from home on 3/6 w/ cc: 3d h/o cough, fever and increasing shortness of breath. No known sick contacts.  ED eval:  Hypoxic required escalation of supplemental oxygen for increased WOB. Was febrile (as high as 102F). Had elevated lactic acid >4. CXR clear, WBC 11.6, RVP positive for influenza A. PCT neg. BNP 115.  Administered IVFs, started on solumedrol, inhaled BDs and  abx. As well as tamiflu.   Was starting to feel better on 3/8, still SOB. Changed back to solumedrol for wheezing and on-going higher O2 needs (6 lpm),  3/9 CXR showing RLL airspace disease, SLP also consulted in case this represented aspiration given CVA Hx.  3/10 requiring 12-->15 liters FM.  CXR w/ marked progression of right sided airspace disease.  Pertinent  Medical History  HTN, COPD, tobacco abuse, CVA X 2, ETOH use.   Significant Hospital Events: Including procedures, antibiotic start and stop dates in addition to other pertinent events   3/6 admitted. Steroids started, placed on O2, nebs, tamiflu and doxy 3/7 added CTX x 4 days 3/9 CXR worse. Still on 6 lpm 3/10 Pulm asked to see for worsening resp failure   Interim History / Subjective:  No distress but does feel like breathing is worse   Objective   Blood pressure (!) 124/59, pulse 91, temperature 98.6 F (37 C), temperature source Oral, resp. rate (!) 30, height 5\' 9"  (1.753 m), weight 71.7 kg, SpO2 (!) 85%.        Intake/Output Summary (Last 24 hours) at 07/16/2023 1119 Last data filed at 07/16/2023 0543 Gross per 24 hour  Intake 360 ml  Output 1675 ml  Net -1315 ml   Filed Weights   07/12/23 1507  Weight: 71.7 kg    Examination: General: 58 year old male. Laying in bed. No  distress but is on 15 liters [place]ter.  HENT NCAT no JVD. MMM Lungs: bilateral rales R>L. And more pronounced posterior. Bilateral wheezing R>L  CXR showing fairly sig progression of R>L airspace disease  Cardiovascular: RRR w/out MRG Abdomen: soft not tender  Extremities: warm and dry  Neuro: awake, alert. Sp dysarthric at baseline no focal def otherwise  GU: voids  Resolved Hospital Problem list     Assessment & Plan:   Acute hypoxic respiratory failure  Right sided multifocal Pneumonia  AECOPD Tobacco abuse Influenza A infection  Prior stroke w/ chronic dysarthria  Sepsis  AKI (improving) Hyperglycemia   Pulm prob list  Acute hypoxic respiratory failure 2/2 influenza A infection w/ worsening right sided airspace disease.  -puzzling, he's had progression of airspace disease and O2 needs in spite of appropriate therapy to date.  Although his PCT is reassuring and WBC normalized I still worry about resistant bacterial process. Aspiration on ddx but if this is the case he's silently aspirating. Asymmetric pulm edema seems unlikely but still possible  Plan Cont supplemental oxygen and continuous pulse ox  Widen abx: add vanc and cefepime in addition to the Doxy  Ck MRSA PCR Cont flutter and IS Agree w/ lasix trial  Cont steroids  Repeat AM CXR Can stay in progressive for now but not much room to go here. Can try heated high flow if  needed but if we get to point he needs NIPPV will need to go to unit.  Getting echo  Sending extended viral panel  Agree w/ swallow eval at some point   AECOPD 2/2 above on-going tobacco abuse  Plan  Oxygen and pulse ox as above Cont brovana and pulmicort bid Cont duoneb q 6 I don't get the sense that adding HT saline neb will help much here    Best Practice (right click and "Reselect all SmartList Selections" daily)  Per primary   Labs   CBC: Recent Labs  Lab 07/12/23 1513 07/12/23 1539 07/13/23 0343 07/15/23 0555 07/16/23 0536   WBC 11.6*  --  7.6 6.3 7.6  NEUTROABS 9.6*  --   --   --  6.8  HGB 12.4* 13.3 10.1* 11.3* 11.7*  HCT 38.5* 39.0 30.5* 34.2* 35.9*  MCV 86.9  --  86.4 86.1 86.1  PLT 202  --  155 182 192    Basic Metabolic Panel: Recent Labs  Lab 07/12/23 1513 07/12/23 1539 07/13/23 0343 07/13/23 2311 07/15/23 0555 07/16/23 0536  NA 137 139 138 140 139 137  K 3.2* 3.2* 4.0 4.0 4.5 4.6  CL 100  --  106 105 107 105  CO2 22  --  25 24 22 22   GLUCOSE 166*  --  170* 145* 135* 134*  BUN 18  --  20 23* 16 22*  CREATININE 1.90*  --  1.44* 1.41* 1.22 1.22  CALCIUM 8.5*  --  7.8* 8.4* 8.3* 8.6*  MG  --   --   --  1.9 2.2 2.0  PHOS  --   --   --   --  3.1  --    GFR: Estimated Creatinine Clearance: 66 mL/min (by C-G formula based on SCr of 1.22 mg/dL). Recent Labs  Lab 07/12/23 1513 07/12/23 1617 07/12/23 1723 07/13/23 0343 07/15/23 0555 07/16/23 0536  PROCALCITON  --   --   --  0.57 0.18 <0.10  WBC 11.6*  --   --  7.6 6.3 7.6  LATICACIDVEN  --  4.7* 4.2*  --   --   --     Liver Function Tests: Recent Labs  Lab 07/12/23 1513  AST 54*  ALT 21  ALKPHOS 118  BILITOT 0.4  PROT 7.5  ALBUMIN 3.9   No results for input(s): "LIPASE", "AMYLASE" in the last 168 hours. No results for input(s): "AMMONIA" in the last 168 hours.  ABG    Component Value Date/Time   HCO3 29.1 (H) 07/13/2023 0343   TCO2 25 07/12/2023 1539   ACIDBASEDEF 2.0 07/12/2023 1539   O2SAT 86.6 07/13/2023 0343     Coagulation Profile: Recent Labs  Lab 07/12/23 1513  INR 1.0    Cardiac Enzymes: No results for input(s): "CKTOTAL", "CKMB", "CKMBINDEX", "TROPONINI" in the last 168 hours.  HbA1C: Hgb A1c MFr Bld  Date/Time Value Ref Range Status  07/13/2023 03:43 AM 5.4 4.8 - 5.6 % Final    Comment:    (NOTE) Pre diabetes:          5.7%-6.4%  Diabetes:              >6.4%  Glycemic control for   <7.0% adults with diabetes   06/13/2022 02:52 PM 5.6 4.8 - 5.6 % Final    Comment:             Prediabetes:  5.7 - 6.4          Diabetes: >6.4  Glycemic control for adults with diabetes: <7.0     CBG: Recent Labs  Lab 07/15/23 0729 07/15/23 1223 07/15/23 1525 07/15/23 2118 07/16/23 0842  GLUCAP 153* 188* 147* 154* 156*    Review of Systems:   Review of Systems  Constitutional:  Positive for fever and malaise/fatigue.  HENT:  Negative for congestion, sinus pain and sore throat.   Eyes: Negative.   Respiratory:  Positive for cough, shortness of breath and wheezing. Negative for sputum production.   Cardiovascular:  Negative for chest pain and leg swelling.  Gastrointestinal: Negative.   Genitourinary: Negative.   Musculoskeletal: Negative.   Skin: Negative.   Neurological: Negative.   Endo/Heme/Allergies: Negative.      Past Medical History:  He,  has a past medical history of GERD (gastroesophageal reflux disease), Hyperlipidemia, Hypertension, PUD (peptic ulcer disease) (2008), Stroke [facility]) (01/2021), and Stroke (HCC) (09/2021).   Surgical History:   Past Surgical History:  Procedure Laterality Date   CLAVICLE SURGERY Right    fracture repair   INGUINAL HERNIA REPAIR Right    age 38   rupture ulcer       Social History:   reports that he has been smoking cigarettes. He has quit using smokeless tobacco. He reports current alcohol use. He reports that he does not currently use drugs after having used the following drugs: "Crack" cocaine.   Family History:  His family history includes Cancer in his mother; Stroke in his father. There is no history of Colon cancer, Stomach cancer, or Esophageal cancer.   Allergies No Known Allergies   Home Medications  Prior to Admission medications   Medication Sig Start Date End Date Taking? Authorizing Provider  acetaminophen (TYLENOL) 325 MG tablet Take 1-2 tablets (325-650 mg total) by mouth every 4 (four) hours as needed for mild pain. 09/21/21  Yes Setzer, Lynnell Jude, PA-C  amLODipine (NORVASC) 10 MG tablet Take 1 tablet  (10 mg total) by mouth daily. 06/05/23  Yes Georganna Skeans, MD  aspirin EC 325 MG tablet Take 1 tablet (325 mg total) by mouth daily. 06/13/22  Yes Micki Riley, MD  atorvastatin (LIPITOR) 20 MG tablet Take 1 tablet (20 mg total) by mouth daily. 06/05/23  Yes Georganna Skeans, MD  lisinopril (ZESTRIL) 20 MG tablet Take 1 tablet (20 mg total) by mouth daily. 06/05/23  Yes Georganna Skeans, MD  omeprazole (PRILOSEC) 20 MG capsule Take 1 capsule (20 mg total) by mouth daily. 06/05/23 12/02/23 Yes Georganna Skeans, MD     Critical care time: NA

## 2023-07-16 NOTE — Progress Notes (Signed)
 Echocardiogram 2D Echocardiogram has been performed.  Christopher [name] 07/16/2023, 2:38 PM

## 2023-07-16 NOTE — Evaluation (Signed)
 Clinical/Bedside Swallow Evaluation Patient Details  Name: Christopher [name] MRN: [id number] Date of Birth: [birth year]  Today's Date: 07/16/2023 Time: SLP Start Time (ACUTE ONLY): 0932 SLP Stop Time (ACUTE ONLY): 4098 SLP Time Calculation (min) (ACUTE ONLY): 5 min  Past Medical History:  Past Medical History:  Diagnosis Date   GERD (gastroesophageal reflux disease)    Hyperlipidemia    Hypertension    PUD (peptic ulcer disease) 2008   treated with meds   Stroke (HCC) 01/2021   Stroke (HCC) 09/2021   Past Surgical History:  Past Surgical History:  Procedure Laterality Date   CLAVICLE SURGERY Right    fracture repair   INGUINAL HERNIA REPAIR Right    age 48   rupture ulcer     HPI:  Christopher [name] is a 58 year old who presented with fever cough shortness of breath. CXR 3/10: "Progressive multifocal opacities within the right upper and right lower lung compatible with multifocal pneumonia."  Found to be positive for influenza. 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    Assessment / Plan / Recommendation  Clinical Impression  Pt presents with functional swallowing as assessed clinically.  Pt has hx of dysphagia following CVA with improvement during hospitalization and tx in 2023.  He reports he presently consumes a regular texture diet.  He is largely edentulous and does not use dentures.  Pt with dysarthria at baseline, but was 100% intelligible with only mild articulatory imprecision.  Today pt did not exhibit any clinical s/s of aspiration with any consistencies trialed and pt exhibited adequate oral clearance of solids.  Offered soft diet texture if desired 2/2 edentulism, but pt prefers to choose softer foods from unrestricted diet as needed. Mild tachypnea noted, reviewed respiratory precautions with PO intake.  Pt without recent hx of pneumonia (reports prior pna 3-4 years ago).  Pt appears  safe to continue PO diet and does not have any further ST needs at this time.  SLP will sign off.    Recommend regular texture diet with thin liquids.   SLP Visit Diagnosis: Dysphagia, unspecified (R13.10)    Aspiration Risk  No limitations    Diet Recommendation Regular;Thin liquid    Liquid Administration via: Cup;Straw Medication Administration: Whole meds with liquid Supervision: Patient able to self feed Compensations: Slow rate;Small sips/bites Postural Changes: Seated upright at 90 degrees    Other  Recommendations Oral Care Recommendations: Oral care BID    Recommendations for follow up therapy are one component of a multi-disciplinary discharge planning process, led by the attending physician.  Recommendations may be updated based on patient status, additional functional criteria and insurance authorization.  Follow up Recommendations No SLP follow up      Assistance Recommended at Discharge  N/A  Functional Status Assessment Patient has not had a recent decline in their functional status  Frequency and Duration  (N/A)          Prognosis Prognosis for improved oropharyngeal function:  (N/A)      Swallow Study   General Date of Onset: 07/12/23 HPI: Christopher [name] is a 58 year old who presented with fever cough shortness of breath. CXR 3/10: "Progressive multifocal opacities within the right upper and right lower lung compatible with multifocal pneumonia."  Found to be positive for influenza. 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 Type of Study: Bedside Swallow  Evaluation Previous Swallow Assessment: MBS 09/12/21 following CVA with recs for D2/thin Diet Prior to this Study: Regular;Thin liquids (Level 0) Temperature Spikes Noted: No Respiratory Status: Nasal cannula History of Recent Intubation: No Behavior/Cognition: Alert;Cooperative;Pleasant mood Oral Cavity Assessment:  Within Functional Limits Oral Care Completed by SLP: No Oral Cavity - Dentition: Edentulous (largely, a couple of teeth on lower arch) Vision: Functional for self-feeding Self-Feeding Abilities: Able to feed self Patient Positioning: Upright in bed Baseline Vocal Quality: Normal Volitional Cough: Strong Volitional Swallow: Able to elicit    Oral/Motor/Sensory Function Overall Oral Motor/Sensory Function: Within functional limits   Ice Chips Ice chips: Not tested   Thin Liquid Thin Liquid: Within functional limits Presentation: Straw    Nectar Thick Nectar Thick Liquid: Not tested   Honey Thick Honey Thick Liquid: Not tested   Puree Puree: Within functional limits Presentation: Spoon   Solid     Solid: Within functional limits Presentation: Self Fed      Kerrie Pleasure, MA, CCC-SLP Acute Rehabilitation Services Office: [phone] 07/16/2023,10:00 AM

## 2023-07-17 ENCOUNTER — Inpatient Hospital Stay (HOSPITAL_COMMUNITY)

## 2023-07-17 DIAGNOSIS — J441 Chronic obstructive pulmonary disease with (acute) exacerbation: Secondary | ICD-10-CM | POA: Diagnosis not present

## 2023-07-17 DIAGNOSIS — J09X2 Influenza due to identified novel influenza A virus with other respiratory manifestations: Secondary | ICD-10-CM | POA: Diagnosis not present

## 2023-07-17 DIAGNOSIS — J9601 Acute respiratory failure with hypoxia: Secondary | ICD-10-CM | POA: Diagnosis not present

## 2023-07-17 LAB — CBC WITH DIFFERENTIAL/PLATELET
Abs Immature Granulocytes: 0 10*3/uL (ref 0.00–0.07)
Basophils Absolute: 0 10*3/uL (ref 0.0–0.1)
Basophils Relative: 0 %
Eosinophils Absolute: 0 10*3/uL (ref 0.0–0.5)
Eosinophils Relative: 0 %
HCT: 33 % — ABNORMAL LOW (ref 39.0–52.0)
Hemoglobin: 11.3 g/dL — ABNORMAL LOW (ref 13.0–17.0)
Lymphocytes Relative: 3 %
Lymphs Abs: 0.2 10*3/uL — ABNORMAL LOW (ref 0.7–4.0)
MCH: 28.3 pg (ref 26.0–34.0)
MCHC: 34.2 g/dL (ref 30.0–36.0)
MCV: 82.5 fL (ref 80.0–100.0)
Monocytes Absolute: 0.4 10*3/uL (ref 0.1–1.0)
Monocytes Relative: 5 %
Neutro Abs: 7.5 10*3/uL (ref 1.7–7.7)
Neutrophils Relative %: 92 %
Platelets: 216 10*3/uL (ref 150–400)
RBC: 4 MIL/uL — ABNORMAL LOW (ref 4.22–5.81)
RDW: 13.3 % (ref 11.5–15.5)
WBC: 8.2 10*3/uL (ref 4.0–10.5)
nRBC: 0 % (ref 0.0–0.2)
nRBC: 0 /100{WBCs}

## 2023-07-17 LAB — CULTURE, BLOOD (ROUTINE X 2)
Culture: NO GROWTH
Culture: NO GROWTH

## 2023-07-17 LAB — GLUCOSE, CAPILLARY
Glucose-Capillary: 147 mg/dL — ABNORMAL HIGH (ref 70–99)
Glucose-Capillary: 146 mg/dL — ABNORMAL HIGH (ref 70–99)
Glucose-Capillary: 120 mg/dL — ABNORMAL HIGH (ref 70–99)
Glucose-Capillary: 174 mg/dL — ABNORMAL HIGH (ref 70–99)

## 2023-07-17 LAB — MAGNESIUM: Magnesium: 2 mg/dL (ref 1.7–2.4)

## 2023-07-17 LAB — BASIC METABOLIC PANEL
Anion gap: 13 (ref 5–15)
BUN: 23 mg/dL — ABNORMAL HIGH (ref 6–20)
CO2: 19 mmol/L — ABNORMAL LOW (ref 22–32)
Calcium: 8.3 mg/dL — ABNORMAL LOW (ref 8.9–10.3)
Chloride: 103 mmol/L (ref 98–111)
Creatinine, Ser: 1.22 mg/dL (ref 0.61–1.24)
GFR, Estimated: >60 mL/min (ref >60)
Glucose, Bld: 155 mg/dL — ABNORMAL HIGH (ref 70–99)
Potassium: 4.3 mmol/L (ref 3.5–5.1)
Sodium: 135 mmol/L (ref 135–145)

## 2023-07-17 LAB — PROCALCITONIN: Procalcitonin: <0.1 ng/mL

## 2023-07-17 LAB — C-REACTIVE PROTEIN: CRP: 3.1 mg/dL — ABNORMAL HIGH (ref <1.0)

## 2023-07-17 LAB — BRAIN NATRIURETIC PEPTIDE: B Natriuretic Peptide: 35.9 pg/mL (ref 0.0–100.0)

## 2023-07-17 MED ORDER — FUROSEMIDE 10 MG/ML IJ SOLN
80.0000 mg | Freq: Once | INTRAMUSCULAR | Status: AC
  Administered 2023-07-17: 80 mg via INTRAVENOUS
  Filled 2023-07-17: qty 8

## 2023-07-17 MED ORDER — FUROSEMIDE 10 MG/ML IJ SOLN
40.0000 mg | Freq: Once | INTRAMUSCULAR | Status: AC
  Administered 2023-07-17: 40 mg via INTRAVENOUS
  Filled 2023-07-17: qty 4

## 2023-07-17 MED ORDER — CHLORHEXIDINE GLUCONATE CLOTH 2 % EX PADS
6.0000 | MEDICATED_PAD | Freq: Every day | CUTANEOUS | Status: DC
  Administered 2023-07-17 – 2023-07-24 (×7): 6 via TOPICAL

## 2023-07-17 NOTE — Progress Notes (Signed)
 RT NOTE:  Pt pulled BIPAP mask off. Pt stated he is unable to tolerate because he is claustrophobic. Education provided. Pt states he will not try again. Pt now back on HHFNC 45L / 100%. SpO2 goal of 88%.

## 2023-07-17 NOTE — Progress Notes (Signed)
 [facility] PROGRESS NOTE   EBENEZER MCCASKEY UEA:[id number] DOB: [birth year] DOA: 07/12/2023  PCP: Georganna Skeans, MD  Brief History: 58 year old with past medical history significant for hypertension, COPD, CVA x 2, ongoing tobacco and alcohol use, presented with fever cough shortness of breath.  Found to be positive for influenza.  Hospitalized for further management.     Consultants: PCCM  Procedures:   TTE -  1. Left ventricular ejection fraction, by estimation, is 60 to 65%. Left ventricular ejection fraction by 3D volume is 65 %. The left ventricle has normal function. The left ventricle has no regional wall motion abnormalities. Left ventricular diastolic  parameters are consistent with Grade I diastolic dysfunction (impaired relaxation). The average left ventricular global longitudinal strain is -15.1 %. The global longitudinal strain is abnormal.  2. Right ventricular systolic function is normal. The right ventricular size is normal. Tricuspid regurgitation signal is inadequate for assessing PA pressure.  3. The mitral valve is normal in structure. No evidence of mitral valve regurgitation. No evidence of mitral stenosis.  4. The aortic valve is tricuspid. There is mild calcification of the aortic valve. Aortic valve regurgitation is mild. No aortic stenosis is present.  5. The inferior vena cava is normal in size with greater than 50% respiratory variability, suggesting right atrial pressure of 3 mmHg.  Subjective -  Patient in bed, appears comfortable, denies any headache, no fever, no chest pain or pressure, no shortness of breath , no abdominal pain. No new focal weakness.  Assessment/Plan:  Severe acute hypoxic respiratory failure in a patient with COPD, now with acute influenza A pneumonia.  Likely triggered by influenza.  He does have significant smoking history.  He is currently on 12 to 15 L of high flow oxygen, also has significant COPD history and long history of  smoking.  Treated with Tamiflu for his influenza infection, steroids for COPD exacerbation, nebulizer treatments, as needed IV Lasix, he has been continued on nebulizer treatments and supplemental oxygen, not much improvement in the last 2 days.  Still significant hypoxia, PCCM on board, unfortunately clinically not much improved, he might require intubation if he continues to decline.  He has been encouraged to sit in chair use I-S and flutter valve for pulmonary toiletry.  Continue to monitor.  Echo was nonacute.   SpO2: (!) 88 % O2 Flow Rate (L/min): 30 L/min FiO2 (%): 70 %   Influenza A positive, acute viral illness/severe sepsis Present with fever 101, tachycardia 131, tachypnea respiration 37, the setting of influenza A Continue with Tamiflu.  Still febrile. Follow-up on blood cultures.  AKI - Presented with a creatinine of 1.9.  Improved.  Continue to monitor.  Monitor urine output.  Avoid nephrotoxic agents.     Hypokalemia  Supplemented    Hemiparesis affecting right side history of CVA  - Stable.  Continue aspirin and Lipitor   Dyslipidemia:   Continue Lipitor   Essential hypertension Norvasc and lisinopril on hold due to soft blood pressure and AKI.   Tobacco Abuse  Counseled.   DVT Prophylaxis: Subcutaneous heparin Code Status: Full code Family Communication: sister [name] [phone] - 07/16/23 Disposition Plan: Home fully return home when improved.  Status is: Inpatient Remains inpatient appropriate because: IV antibiotics    Objective:  Vital Signs  Vitals:   07/17/23 0123 07/17/23 0433 07/17/23 0713 07/17/23 0811  BP:  121/78  128/61  Pulse:  83  98  Resp:  18  (!) 35  Temp:  98.8  F (37.1 C)  98.6 F (37 C)  TempSrc:  Oral  Oral  SpO2: 92% 92% (!) 89% (!) 88%  Weight:      Height:        Intake/Output Summary (Last 24 hours) at 07/17/2023 1005 Last data filed at 07/17/2023 1610 Gross per 24 hour  Intake 236 ml  Output 1600 ml  Net -1364 ml    Filed Weights   07/12/23 1507 07/16/23 2030  Weight: 71.7 kg 71.6 kg   Exam   Awake Alert, No new F.N deficits, Normal affect [place].AT,PERRAL Supple Neck, No JVD,   Symmetrical Chest wall movement, Good air movement bilaterally, few coarse rales bilaterally  RRR,No Gallops, Rubs or new Murmurs,  +ve B.Sounds, Abd Soft, No tenderness,   No Cyanosis, Clubbing or edema    Lab Results:  Data Reviewed: I have personally reviewed following labs and reports of the imaging studies   Data Review:   Inpatient Medications  Scheduled Meds:  arformoterol  15 mcg Nebulization BID   aspirin EC  325 mg Oral Daily   atorvastatin  20 mg Oral QHS   budesonide (PULMICORT) nebulizer solution  0.25 mg Nebulization BID   folic acid  1 mg Oral Daily   guaiFENesin  1,200 mg Oral BID   heparin  5,000 Units Subcutaneous Q8H   insulin aspart  0-9 Units Subcutaneous TID WC   ipratropium-albuterol  3 mL Nebulization Q6H   methylPREDNISolone (SOLU-MEDROL) injection  40 mg Intravenous Q12H   multivitamin with minerals  1 tablet Oral Daily   oseltamivir  75 mg Oral BID   pantoprazole  40 mg Oral QHS   thiamine  100 mg Oral Daily   umeclidinium bromide  1 puff Inhalation Daily   Continuous Infusions:  ceFEPime (MAXIPIME) IV 2 g (07/17/23 0447)   vancomycin 1,500 mg (07/17/23 0933)    PRN Meds:.acetaminophen **OR** acetaminophen, albuterol, metoprolol tartrate, ondansetron **OR** ondansetron (ZOFRAN) IV, oxyCODONE, polyethylene glycol  DVT Prophylaxis  heparin injection 5,000 Units Start: 07/12/23 2200    Recent Labs  Lab 07/12/23 1513 07/12/23 1539 07/13/23 0343 07/15/23 0555 07/16/23 0536 07/17/23 0438  WBC 11.6*  --  7.6 6.3 7.6 8.2  HGB 12.4* 13.3 10.1* 11.3* 11.7* 11.3*  HCT 38.5* 39.0 30.5* 34.2* 35.9* 33.0*  PLT 202  --  155 182 192 216  MCV 86.9  --  86.4 86.1 86.1 82.5  MCH 28.0  --  28.6 28.5 28.1 28.3  MCHC 32.2  --  33.1 33.0 32.6 34.2  RDW 12.9  --  13.0 13.1 13.2 13.3   LYMPHSABS 1.3  --   --   --  0.4* 0.2*  MONOABS 0.5  --   --   --  0.3 0.4  EOSABS 0.0  --   --   --  0.1 0.0  BASOSABS 0.0  --   --   --  0.0 0.0    Recent Labs  Lab 07/12/23 1513 07/12/23 1539 07/12/23 1617 07/12/23 1723 07/13/23 0343 07/13/23 2311 07/15/23 0555 07/16/23 0536 07/17/23 0438  NA 137   < >  --   --  138 140 139 137 135  K 3.2*   < >  --   --  4.0 4.0 4.5 4.6 4.3  CL 100  --   --   --  106 105 107 105 103  CO2 22  --   --   --  25 24 22 22  19*  ANIONGAP 15  --   --   --  7 11 10 10 13   GLUCOSE 166*  --   --   --  170* 145* 135* 134* 155*  BUN 18  --   --   --  20 23* 16 22* 23*  CREATININE 1.90*  --   --   --  1.44* 1.41* 1.22 1.22 1.22  AST 54*  --   --   --   --   --   --   --   --   ALT 21  --   --   --   --   --   --   --   --   ALKPHOS 118  --   --   --   --   --   --   --   --   BILITOT 0.4  --   --   --   --   --   --   --   --   ALBUMIN 3.9  --   --   --   --   --   --   --   --   CRP  --   --   --   --   --   --  7.5* 3.1* 3.1*  PROCALCITON  --   --   --   --  0.57  --  0.18 <0.10 <0.10  LATICACIDVEN  --   --  4.7* 4.2*  --   --   --   --   --   INR 1.0  --   --   --   --   --   --   --   --   TSH  --   --   --   --  0.471  --   --   --   --   HGBA1C  --   --   --   --  5.4  --   --   --   --   BNP  --   --   --   --  114.9*  --  213.2* 49.4 35.9  MG  --   --   --   --   --  1.9 2.2 2.0 2.0  PHOS  --   --   --   --   --   --  3.1  --   --   CALCIUM 8.5*  --   --   --  7.8* 8.4* 8.3* 8.6* 8.3*   < > = values in this interval not displayed.      Recent Labs  Lab 07/12/23 1513 07/12/23 1617 07/12/23 1723 07/13/23 0343 07/13/23 2311 07/15/23 0555 07/16/23 0536 07/17/23 0438  CRP  --   --   --   --   --  7.5* 3.1* 3.1*  PROCALCITON  --   --   --  0.57  --  0.18 <0.10 <0.10  LATICACIDVEN  --  4.7* 4.2*  --   --   --   --   --   INR 1.0  --   --   --   --   --   --   --   TSH  --   --   --  0.471  --   --   --   --   HGBA1C  --   --   --   5.4  --   --   --   --   BNP  --   --   --  114.9*  --  213.2* 49.4 35.9  MG  --   --   --   --  1.9 2.2 2.0 2.0  CALCIUM 8.5*  --   --  7.8* 8.4* 8.3* 8.6* 8.3*    --------------------------------------------------------------------------------------------------------------- Lab Results  Component Value Date   CHOL 131 06/13/2022   HDL 44 06/13/2022   LDLCALC 55 06/13/2022   TRIG 198 (H) 06/13/2022   CHOLHDL 3.0 06/13/2022    Lab Results  Component Value Date   HGBA1C 5.4 07/13/2023   No results for input(s): "TSH", "T4TOTAL", "FREET4", "T3FREE", "THYROIDAB" in the last 72 hours.  No results for input(s): "VITAMINB12", "FOLATE", "FERRITIN", "TIBC", "IRON", "RETICCTPCT" in the last 72 hours. ------------------------------------------------------------------------------------------------------------------ Cardiac Enzymes No results for input(s): "CKMB", "TROPONINI", "MYOGLOBIN" in the last 168 hours.  Invalid input(s): "CK"  Micro Results Recent Results (from the past 240 hours)  Culture, blood (Routine x 2)     Status: None   Collection Time: 07/12/23  3:18 PM   Specimen: BLOOD  Result Value Ref Range Status   Specimen Description BLOOD LEFT ANTECUBITAL  Final   Special Requests   Final    BOTTLES DRAWN AEROBIC AND ANAEROBIC Blood Culture results may not be optimal due to an inadequate volume of blood received in culture bottles   Culture   Final    NO GROWTH 5 DAYS Performed at [facility], [address] [place], [place] [address]    Report Status 07/17/2023 FINAL  Final  Culture, blood (Routine x 2)     Status: None   Collection Time: 07/12/23  3:32 PM   Specimen: BLOOD LEFT HAND  Result Value Ref Range Status   Specimen Description BLOOD LEFT HAND  Final   Special Requests   Final    BOTTLES DRAWN AEROBIC AND ANAEROBIC Blood Culture results may not be optimal due to an inadequate volume of blood received in culture bottles   Culture   Final    NO GROWTH  5 DAYS Performed at [facility], [address] [place], [place] [address]    Report Status 07/17/2023 FINAL  Final  Resp panel by RT-PCR (RSV, Flu A&B, Covid) Anterior Nasal Swab     Status: Abnormal   Collection Time: 07/12/23  6:53 PM   Specimen: Anterior Nasal Swab  Result Value Ref Range Status   SARS Coronavirus 2 by RT PCR NEGATIVE NEGATIVE Final   Influenza A by PCR POSITIVE (A) NEGATIVE Final   Influenza B by PCR NEGATIVE NEGATIVE Final    Comment: (NOTE) The Xpert Xpress SARS-CoV-2/FLU/RSV plus assay is intended as an aid in the diagnosis of influenza from Nasopharyngeal swab specimens and should not be used as a sole basis for treatment. Nasal washings and aspirates are unacceptable for Xpert Xpress SARS-CoV-2/FLU/RSV testing.  Fact Sheet for Patients: BloggerCourse.com  Fact Sheet for Healthcare Providers: SeriousBroker.it  This test is not yet approved or cleared by the [place] FDA and has been authorized for detection and/or diagnosis of SARS-CoV-2 by FDA under an Emergency Use Authorization (EUA). This EUA will remain in effect (meaning this test can be used) for the duration of the COVID-19 declaration under Section 564(b)(1) of the Act, 21 U.S.C. section 360bbb-3(b)(1), unless the authorization is terminated or revoked.     Resp Syncytial Virus by PCR NEGATIVE NEGATIVE Final    Comment: (NOTE) Fact Sheet for Patients: BloggerCourse.com  Fact Sheet for Healthcare Providers: SeriousBroker.it  This test is not yet approved or  cleared by the [place] and has been authorized for detection and/or diagnosis of SARS-CoV-2 by FDA under an Emergency Use Authorization (EUA). This EUA will remain in effect (meaning this test can be used) for the duration of the COVID-19 declaration under Section 564(b)(1) of the Act, 21 U.S.C. section  360bbb-3(b)(1), unless the authorization is terminated or revoked.  Performed at [facility], [address] [place], [place] [address]   Respiratory (~20 pathogens) panel by PCR     Status: Abnormal   Collection Time: 07/16/23  1:09 PM   Specimen: Nasopharyngeal Swab; Respiratory  Result Value Ref Range Status   Adenovirus NOT DETECTED NOT DETECTED Final   Coronavirus 229E NOT DETECTED NOT DETECTED Final    Comment: (NOTE) The Coronavirus on the Respiratory Panel, DOES NOT test for the novel  Coronavirus (2019 nCoV)    Coronavirus HKU1 NOT DETECTED NOT DETECTED Final   Coronavirus NL63 NOT DETECTED NOT DETECTED Final   Coronavirus OC43 NOT DETECTED NOT DETECTED Final   Metapneumovirus NOT DETECTED NOT DETECTED Final   Rhinovirus / Enterovirus NOT DETECTED NOT DETECTED Final   Influenza A H1 2009 DETECTED (A) NOT DETECTED Final   Influenza B NOT DETECTED NOT DETECTED Final   Parainfluenza Virus 1 NOT DETECTED NOT DETECTED Final   Parainfluenza Virus 2 NOT DETECTED NOT DETECTED Final   Parainfluenza Virus 3 NOT DETECTED NOT DETECTED Final   Parainfluenza Virus 4 NOT DETECTED NOT DETECTED Final   Respiratory Syncytial Virus NOT DETECTED NOT DETECTED Final   Bordetella pertussis NOT DETECTED NOT DETECTED Final   Bordetella Parapertussis NOT DETECTED NOT DETECTED Final   Chlamydophila pneumoniae NOT DETECTED NOT DETECTED Final   Mycoplasma pneumoniae NOT DETECTED NOT DETECTED Final    Comment: Performed at [facility], [address] [place], [place] [address]  MRSA Next Gen by PCR, Nasal     Status: None   Collection Time: 07/16/23  1:23 PM   Specimen: Nasal Mucosa; Nasal Swab  Result Value Ref Range Status   MRSA by PCR Next Gen NOT DETECTED NOT DETECTED Final    Comment: (NOTE) The GeneXpert MRSA Assay (FDA approved for NASAL specimens only), is one component of a comprehensive MRSA colonization surveillance program. It is not intended to diagnose MRSA  infection nor to guide or monitor treatment for MRSA infections. Test performance is not FDA approved in patients less than 4 years old. Performed at [facility], [address], [place], [place] [address]     Radiology Studies: ECHOCARDIOGRAM COMPLETE Result Date: 07/16/2023    ECHOCARDIOGRAM REPORT   Patient Name:   SIMUEL STEBNER Date of Exam: 07/16/2023 Medical Rec #:  [id number]        Height:       69.0 in Accession #:    [id number]       Weight:       158.1 lb Date of Birth:  June 05, 1965        BSA:          1.869 m Patient Age:    57 years         BP:           124/59 mmHg Patient Gender: M                HR:           86 bpm. Exam Location:  Inpatient Procedure: 2D Echo, 3D Echo, Cardiac Doppler, Color Doppler and Strain  Analysis            (Both Spectral and Color Flow Doppler were utilized during            procedure). Indications:    Dyspnea  History:        Patient has prior history of Echocardiogram examinations, most                 recent 09/10/2021. COPD; Risk Factors:Hypertension, Dyslipidemia                 and Current Smoker.  Sonographer:    Karma Ganja Referring Phys: [phone] PETER E BABCOCK  Sonographer Comments: Global longitudinal strain was attempted. IMPRESSIONS  1. Left ventricular ejection fraction, by estimation, is 60 to 65%. Left ventricular ejection fraction by 3D volume is 65 %. The left ventricle has normal function. The left ventricle has no regional wall motion abnormalities. Left ventricular diastolic  parameters are consistent with Grade I diastolic dysfunction (impaired relaxation). The average left ventricular global longitudinal strain is -15.1 %. The global longitudinal strain is abnormal.  2. Right ventricular systolic function is normal. The right ventricular size is normal. Tricuspid regurgitation signal is inadequate for assessing PA pressure.  3. The mitral valve is normal in structure. No evidence of mitral valve regurgitation. No evidence of mitral  stenosis.  4. The aortic valve is tricuspid. There is mild calcification of the aortic valve. Aortic valve regurgitation is mild. No aortic stenosis is present.  5. The inferior vena cava is normal in size with greater than 50% respiratory variability, suggesting right atrial pressure of 3 mmHg. FINDINGS  Left Ventricle: Left ventricular ejection fraction, by estimation, is 60 to 65%. Left ventricular ejection fraction by 3D volume is 65 %. The left ventricle has normal function. The left ventricle has no regional wall motion abnormalities. The average left ventricular global longitudinal strain is -15.1 %. Strain was performed and the global longitudinal strain is abnormal. The left ventricular internal cavity size was normal in size. There is no left ventricular hypertrophy. Left ventricular diastolic parameters are consistent with Grade I diastolic dysfunction (impaired relaxation). Right Ventricle: The right ventricular size is normal. No increase in right ventricular wall thickness. Right ventricular systolic function is normal. Tricuspid regurgitation signal is inadequate for assessing PA pressure. Left Atrium: Left atrial size was normal in size. Right Atrium: Right atrial size was normal in size. Pericardium: There is no evidence of pericardial effusion. Mitral Valve: The mitral valve is normal in structure. No evidence of mitral valve regurgitation. No evidence of mitral valve stenosis. Tricuspid Valve: The tricuspid valve is normal in structure. Tricuspid valve regurgitation is trivial. Aortic Valve: The aortic valve is tricuspid. There is mild calcification of the aortic valve. Aortic valve regurgitation is mild. Aortic regurgitation PHT measures 496 msec. No aortic stenosis is present. Aortic valve mean gradient measures 4.0 mmHg. Aortic valve peak gradient measures 8.0 mmHg. Aortic valve area, by VTI measures 2.10 cm. Pulmonic Valve: The pulmonic valve was normal in structure. Pulmonic valve  regurgitation is not visualized. Aorta: The aortic root is normal in size and structure. Venous: The inferior vena cava is normal in size with greater than 50% respiratory variability, suggesting right atrial pressure of 3 mmHg. IAS/Shunts: No atrial level shunt detected by color flow Doppler. Additional Comments: 3D was performed not requiring image post processing on an independent workstation and was normal.  LEFT VENTRICLE PLAX 2D LVIDd:         4.40 cm  Diastology LVIDs:         3.40 cm         LV e' medial:    7.93 cm/s LV PW:         1.10 cm         LV E/e' medial:  8.9 LV IVS:        1.00 cm         LV e' lateral:   10.60 cm/s LVOT diam:     1.90 cm         LV E/e' lateral: 6.7 LV SV:         55 LV SV Index:   29              2D Longitudinal LVOT Area:     2.84 cm        Strain                                2D Strain GLS   -15.1 %                                Avg:                                 3D Volume EF                                LV 3D EF:    Left                                             ventricul                                             ar                                             ejection                                             fraction                                             by 3D                                             volume is  65 %.                                 3D Volume EF:                                3D EF:        65 %                                LV EDV:       125 ml                                LV ESV:       44 ml                                LV SV:        81 ml RIGHT VENTRICLE          IVC RV Basal diam:  3.40 cm  IVC diam: 1.30 cm LEFT ATRIUM             Index        RIGHT ATRIUM           Index LA diam:        3.70 cm 1.98 cm/m   RA Area:     13.00 cm LA Vol (A2C):   55.5 ml 29.69 ml/m  RA Volume:   31.70 ml  16.96 ml/m LA Vol (A4C):   36.0 ml 19.26 ml/m LA Biplane Vol: 48.3 ml 25.84 ml/m   AORTIC VALVE AV Area (Vmax):    2.27 cm AV Area (Vmean):   1.90 cm AV Area (VTI):     2.10 cm AV Vmax:           141.00 cm/s AV Vmean:          97.700 cm/s AV VTI:            0.260 m AV Peak Grad:      8.0 mmHg AV Mean Grad:      4.0 mmHg LVOT Vmax:         113.00 cm/s LVOT Vmean:        65.300 cm/s LVOT VTI:          0.193 m LVOT/AV VTI ratio: 0.74 AI PHT:            496 msec  AORTA Ao Root diam: 3.30 cm MITRAL VALVE MV Area (PHT): 3.99 cm    SHUNTS MV Decel Time: 190 msec    Systemic VTI:  0.19 m MV E velocity: 70.90 cm/s  Systemic Diam: 1.90 cm MV A velocity: 77.80 cm/s MV E/A ratio:  0.91 Dalton McleanMD Electronically signed by Wilfred Lacy Signature Date/Time: 07/16/2023/2:48:58 PM    Final    DG Chest Port 1 View Result Date: 07/16/2023 CLINICAL DATA:  Shortness of breath, COPD and fever. EXAM: PORTABLE CHEST 1 VIEW COMPARISON:  07/15/2023 FINDINGS: Heart size and mediastinal contours are normal. Aortic atherosclerosis. Progressive multifocal opacities within the right upper and right lower lung. Atelectasis noted in the left base. Possible small pleural effusions with blunting of the costophrenic angles. Visualized osseous structures appear intact.  IMPRESSION: 1. Progressive multifocal opacities within the right upper and right lower lung compatible with multifocal pneumonia. 2. Possible small pleural effusions. Electronically Signed   By: Signa Kell M.D.   On: 07/16/2023 06:31     LOS: 5 days   Signature  -    Susa Raring M.D on 07/17/2023 at 10:05 AM   -  To page go to [email or web address]

## 2023-07-17 NOTE — Progress Notes (Signed)
 I updated his emergency contact

## 2023-07-17 NOTE — Progress Notes (Signed)
 Patient was transfer from 5W to 27M via BIPAP because unable to tx patient on HHFNC. No complications noted. Vitals stable throughout. Once in unit patient was placed back on HHFNC at 40L and 70%. RR31 Spo2 92%. RT and RN aware.

## 2023-07-17 NOTE — Progress Notes (Signed)
 NAME:  Christopher [name], MRN:  [id number], DOB:  [birth year], LOS: 5 ADMISSION DATE:  07/12/2023, CONSULTATION DATE:  3/10 REFERRING MD:  singh, CHIEF COMPLAINT:  resp failure    History of Present Illness:  This is a 58 year old male w/ hx as outlined below. Admitted from home on 3/6 w/ cc: 3d h/o cough, fever and increasing shortness of breath. No known sick contacts.  ED eval:  Hypoxic required escalation of supplemental oxygen for increased WOB. Was febrile (as high as 102F). Had elevated lactic acid >4. CXR clear, WBC 11.6, RVP positive for influenza A. PCT neg. BNP 115.  Administered IVFs, started on solumedrol, inhaled BDs and  abx. As well as tamiflu.   Was starting to feel better on 3/8, still SOB. Changed back to solumedrol for wheezing and on-going higher O2 needs (6 lpm),  3/9 CXR showing RLL airspace disease, SLP also consulted in case this represented aspiration given CVA Hx.  3/10 requiring 12-->15 liters FM.  CXR w/ marked progression of right sided airspace disease.  3/11 heated high flow. Marked distress. Desating to 70s. Slow to see sats rise. Moved to ICU for better nursing care   Pertinent  Medical History  HTN, COPD, tobacco abuse, CVA X 2, ETOH use.   Significant Hospital Events: Including procedures, antibiotic start and stop dates in addition to other pertinent events   3/6 admitted. Steroids started, placed on O2, nebs, tamiflu and doxy 3/7 added CTX x 4 days 3/9 CXR worse. Still on 6 lpm 3/10 Pulm asked to see for worsening resp failure echo:  EF 60 to 65% normal LV function no regional wall motion abnormality grade 1 diastolic dysfunction antibiotics were widened  Interim History / Subjective:  Marked increase WOB. Desaturing to 70s on 70 % HHF and 30 liters when trying to get up to [facility] and back. Still barely 90% w/ marked accessory use   Objective   Blood pressure 116/80, pulse 88, temperature 98.3 F (36.8 C), temperature source Oral, resp. rate (!) 29,  height 5\' 9"  (1.753 m), weight 71.6 kg, SpO2 (!) 86%.    FiO2 (%):  [60 %-70 %] 70 %   Intake/Output Summary (Last 24 hours) at 07/17/2023 1154 Last data filed at 07/17/2023 1137 Gross per 24 hour  Intake 236 ml  Output 1800 ml  Net -1564 ml   Filed Weights   07/12/23 1507 07/16/23 2030  Weight: 71.7 kg 71.6 kg    Examination: General this is a frail 58 year old male. He is in acute distress after attempting to get up to [facility] in  spite of HHF support HENT NCAT no JVD MMM Pulm marked accessory use. Breathing very labored. Diffuse R>L posterior rales. Sats as low as 70sPortable chest x-ray from today personally reviewed shows ongoing right greater than left airspace disease.  The airspace disease on the left is new Card tachy rrr Abd soft Ext no sig edema Neuro awake oriented. Dysarthric at baseline. No focal def  Resolved Hospital Problem list     Assessment & Plan:   Acute hypoxic respiratory failure  Right sided multifocal Pneumonia  AECOPD Tobacco abuse Influenza A infection: H1 N1 Prior stroke w/ chronic dysarthria  Sepsis  AKI (improving) Hyperglycemia   Pulm prob list  Acute hypoxic respiratory failure 2/2 influenza A infection w/ worsening right sided airspace disease.  Treating as community acquired pneumonia superinfection as well as element of acute lung injury MRSA PCR was negative Respiratory viral panel showed  influenza A H1 N1, BNP 36, I&O -2 L Chest x-ray not improved, clinically he is worse w/ increased O2 needs.  He is tenuous and not getting the level of nursing care he needs for how easily he desaturates and his current work of breathing (sats 70s-80s while I was assisting him. No evaluation from staff, in spite of several requests for help (call bell twice by pt and myself going to front desk). I am worried he does not have to ability to continue to tolerate any extended periods of hypoxia   Plan Move to ICU  Cont HHF oxygen and alternate w/ NIPPV Cont  pulse ox Repeat lasix (try to dry him out best we can) Continue cefepime, discontinue vancomycin, agree with stopping Doxy Cont flutter and IS No change in current steroid dosing Complete Tamiflu regimen Respiratory precautions   AECOPD 2/2 above on-going tobacco abuse  Plan  Oxygen and pulse ox as above Cont brovana and pulmicort bid Cont duoneb q 6  Will reach out to family and let them Know. Really not much else to add other than time and support. I am worried he's going to end up on vent    Best Practice (right click and "Reselect all SmartList Selections" daily)  Per primary   My cct 35 minutes

## 2023-07-17 NOTE — Progress Notes (Signed)
 Physical Therapy Treatment Patient Details Name: Christopher [name] MRN: [id number] DOB: 1965-11-24 Today's Date: 07/17/2023   History of Present Illness Pt is 58 yo presenting to [facility] on 3/6 due to fever, cough and shortness of breath; found to be positive for flu. PMH includes HTN, HLD, CVA, PUD, L MCA infarct, hx of cocaine abuse.    PT Comments  Pt resting in bed on arrival and agreeable to session. Pt continues to be most limited by decreased cardiopulmonary endurance with increased O2 needs, on HHFNC 30L at 72% throughout session. Pt SpO2 88% on arrival, dropping to 84 % with bed mobility with RR into low 40s, pt recovering to 89% with cues for PLB and slower RR in about 2 mins. Pt able to maintain sats >88% during tranfers to chair and with seated LE therex. Pt requiring grossly CGA-supervision for all mobility. Pt educated on self monitoring RR on monitor for visual feedback and slowing breaths with all mobility as well as importance of IS use with pt verbalizing understanding.  Pt continues to benefit from skilled PT services to progress toward functional mobility goals.      If plan is discharge home, recommend the following: Help with stairs or ramp for entrance;Assist for transportation;Assistance with cooking/housework   Can travel by private vehicle        Equipment Recommendations  None recommended by PT    Recommendations for Other Services       Precautions / Restrictions Precautions Precautions: Fall Recall of Precautions/Restrictions: Intact Restrictions Weight Bearing Restrictions Per Provider Order: No     Mobility  Bed Mobility Overal bed mobility: Modified Independent             General bed mobility comments: cues for breathing tecniques during transition but no assist needed    Transfers Overall transfer level: Needs assistance Equipment used: None Transfers: Sit to/from Stand Sit to Stand: Supervision           General transfer comment:  supervision for safety. Wide BOS slightly unstable on standing. Used L hand to stabilize self on recliner arm    Ambulation/Gait               General Gait Details: deferred due to pt increased supplemental O2 needs   Stairs             Wheelchair Mobility     Tilt Bed    Modified Rankin (Stroke Patients Only)       Balance Overall balance assessment: Needs assistance Sitting-balance support: Single extremity supported Sitting balance-Leahy Scale: Good     Standing balance support: No upper extremity supported, During functional activity Standing balance-Leahy Scale: Poor Standing balance comment: fair statically without UE support                            Communication Communication Communication: Impaired Factors Affecting Communication: Difficulty expressing self  Cognition Arousal: Alert Behavior During Therapy: WFL for tasks assessed/performed   PT - Cognitive impairments: No apparent impairments                         Following commands: Intact      Cueing Cueing Techniques: Verbal cues  Exercises General Exercises - Lower Extremity Hip Flexion/Marching: AROM, Both, 20 reps, Seated    General Comments General comments (skin integrity, edema, etc.): SpO2 89% on arrival at rest, HHFNC 30L FiO2 72%, dropping to 84% with bed  mobility, recovering in ~2 mins to 90% with cues for slower RR and breathing techniques, able to maintain >88% with stand pivot to chair and seated LE therex      Pertinent Vitals/Pain Pain Assessment Pain Assessment: Faces Faces Pain Scale: No hurt    Home Living                          Prior Function            PT Goals (current goals can now be found in the care plan section) Acute Rehab PT Goals Patient Stated Goal: to return home and get better PT Goal Formulation: With patient Time For Goal Achievement: 07/29/23 Progress towards PT goals: Progressing toward goals     Frequency    Min 2X/week      PT Plan      Co-evaluation              AM-PAC PT "6 Clicks" Mobility   Outcome Measure  Help needed turning from your back to your side while in a flat bed without using bedrails?: A Little Help needed moving from lying on your back to sitting on the side of a flat bed without using bedrails?: A Little Help needed moving to and from a bed to a chair (including a wheelchair)?: A Little Help needed standing up from a chair using your arms (e.g., wheelchair or bedside chair)?: A Little Help needed to walk in hospital room?: A Little Help needed climbing 3-5 steps with a railing? : A Little 6 Click Score: 18    End of Session Equipment Utilized During Treatment: Oxygen Activity Tolerance: Treatment limited secondary to medical complications (Comment) (increased O2 demands and HHFNC) Patient left: in chair;with call bell/phone within reach Nurse Communication: Mobility status PT Visit Diagnosis: Other abnormalities of gait and mobility (R26.89)     Time: 1610-9604 PT Time Calculation (min) (ACUTE ONLY): 20 min  Charges:    $Therapeutic Activity: 8-22 mins PT General Charges $$ ACUTE PT VISIT: 1 Visit                     Tobi Bastos R. PTA Acute Rehabilitation Services Office: [phone]   Catalina Antigua 07/17/2023, 11:54 AM

## 2023-07-17 NOTE — Plan of Care (Signed)
  Problem: Coping: Goal: Ability to adjust to condition or change in health will improve Outcome: Progressing   Problem: Clinical Measurements: Goal: Will remain free from infection Outcome: Progressing Goal: Respiratory complications will improve Outcome: Progressing   Problem: Activity: Goal: Risk for activity intolerance will decrease Outcome: Progressing   Problem: Coping: Goal: Level of anxiety will decrease Outcome: Progressing

## 2023-07-17 NOTE — Plan of Care (Signed)

## 2023-07-18 DIAGNOSIS — J9601 Acute respiratory failure with hypoxia: Secondary | ICD-10-CM | POA: Diagnosis not present

## 2023-07-18 DIAGNOSIS — J441 Chronic obstructive pulmonary disease with (acute) exacerbation: Secondary | ICD-10-CM | POA: Diagnosis not present

## 2023-07-18 DIAGNOSIS — I5032 Chronic diastolic (congestive) heart failure: Secondary | ICD-10-CM

## 2023-07-18 DIAGNOSIS — J101 Influenza due to other identified influenza virus with other respiratory manifestations: Secondary | ICD-10-CM | POA: Diagnosis not present

## 2023-07-18 DIAGNOSIS — A419 Sepsis, unspecified organism: Secondary | ICD-10-CM | POA: Diagnosis not present

## 2023-07-18 DIAGNOSIS — E871 Hypo-osmolality and hyponatremia: Secondary | ICD-10-CM

## 2023-07-18 DIAGNOSIS — E8721 Acute metabolic acidosis: Secondary | ICD-10-CM

## 2023-07-18 LAB — CBC WITH DIFFERENTIAL/PLATELET
Abs Immature Granulocytes: 0 10*3/uL (ref 0.00–0.07)
Basophils Absolute: 0 10*3/uL (ref 0.0–0.1)
Basophils Relative: 0 %
Eosinophils Absolute: 0 10*3/uL (ref 0.0–0.5)
Eosinophils Relative: 0 %
HCT: 34.8 % — ABNORMAL LOW (ref 39.0–52.0)
Hemoglobin: 11.7 g/dL — ABNORMAL LOW (ref 13.0–17.0)
Lymphocytes Relative: 5 %
Lymphs Abs: 0.5 10*3/uL — ABNORMAL LOW (ref 0.7–4.0)
MCH: 28.2 pg (ref 26.0–34.0)
MCHC: 33.6 g/dL (ref 30.0–36.0)
MCV: 83.9 fL (ref 80.0–100.0)
Monocytes Absolute: 0.4 10*3/uL (ref 0.1–1.0)
Monocytes Relative: 4 %
Neutro Abs: 8.3 10*3/uL — ABNORMAL HIGH (ref 1.7–7.7)
Neutrophils Relative %: 91 %
Platelets: 241 10*3/uL (ref 150–400)
RBC: 4.15 MIL/uL — ABNORMAL LOW (ref 4.22–5.81)
RDW: 13.2 % (ref 11.5–15.5)
WBC: 9.1 10*3/uL (ref 4.0–10.5)
nRBC: 0 % (ref 0.0–0.2)
nRBC: 0 /100{WBCs}

## 2023-07-18 LAB — GLUCOSE, CAPILLARY
Glucose-Capillary: 170 mg/dL — ABNORMAL HIGH (ref 70–99)
Glucose-Capillary: 148 mg/dL — ABNORMAL HIGH (ref 70–99)
Glucose-Capillary: 142 mg/dL — ABNORMAL HIGH (ref 70–99)
Glucose-Capillary: 147 mg/dL — ABNORMAL HIGH (ref 70–99)
Glucose-Capillary: 147 mg/dL — ABNORMAL HIGH (ref 70–99)

## 2023-07-18 LAB — BASIC METABOLIC PANEL
Anion gap: 12 (ref 5–15)
BUN: 29 mg/dL — ABNORMAL HIGH (ref 6–20)
CO2: 21 mmol/L — ABNORMAL LOW (ref 22–32)
Calcium: 8.4 mg/dL — ABNORMAL LOW (ref 8.9–10.3)
Chloride: 100 mmol/L (ref 98–111)
Creatinine, Ser: 1.44 mg/dL — ABNORMAL HIGH (ref 0.61–1.24)
GFR, Estimated: 56 mL/min — ABNORMAL LOW (ref >60)
Glucose, Bld: 143 mg/dL — ABNORMAL HIGH (ref 70–99)
Potassium: 4.3 mmol/L (ref 3.5–5.1)
Sodium: 133 mmol/L — ABNORMAL LOW (ref 135–145)

## 2023-07-18 LAB — BRAIN NATRIURETIC PEPTIDE: B Natriuretic Peptide: 19.2 pg/mL (ref 0.0–100.0)

## 2023-07-18 LAB — MAGNESIUM: Magnesium: 2.1 mg/dL (ref 1.7–2.4)

## 2023-07-18 LAB — C-REACTIVE PROTEIN: CRP: 5.6 mg/dL — ABNORMAL HIGH (ref <1.0)

## 2023-07-18 LAB — PROCALCITONIN: Procalcitonin: <0.1 ng/mL

## 2023-07-18 MED ORDER — ATORVASTATIN CALCIUM 10 MG PO TABS
20.0000 mg | ORAL_TABLET | Freq: Every day | ORAL | Status: DC
  Administered 2023-07-18 – 2023-07-23 (×6): 20 mg via ORAL
  Filled 2023-07-18 (×6): qty 2

## 2023-07-18 MED ORDER — ASPIRIN 81 MG PO TBEC
81.0000 mg | DELAYED_RELEASE_TABLET | Freq: Every day | ORAL | Status: DC
  Administered 2023-07-18 – 2023-07-24 (×7): 81 mg via ORAL
  Filled 2023-07-18 (×7): qty 1

## 2023-07-18 MED ORDER — FUROSEMIDE 10 MG/ML IJ SOLN
40.0000 mg | Freq: Once | INTRAMUSCULAR | Status: AC
  Administered 2023-07-18: 40 mg via INTRAVENOUS
  Filled 2023-07-18: qty 4

## 2023-07-18 MED ORDER — CLOPIDOGREL BISULFATE 75 MG PO TABS: 75.0000 mg | ORAL_TABLET | Freq: Every day | ORAL | Status: DC

## 2023-07-18 MED ORDER — ATORVASTATIN CALCIUM 40 MG PO TABS: 40.0000 mg | ORAL_TABLET | Freq: Every day | ORAL | Status: DC

## 2023-07-18 MED ORDER — PIPERACILLIN-TAZOBACTAM 3.375 G IVPB
3.3750 g | Freq: Three times a day (TID) | INTRAVENOUS | Status: DC
  Administered 2023-07-18 – 2023-07-21 (×11): 3.375 g via INTRAVENOUS
  Filled 2023-07-18 (×11): qty 50

## 2023-07-18 NOTE — Progress Notes (Signed)
 Pharmacy Antibiotic Note  Christopher [name] is a 58 y.o. male admitted on 07/12/2023 with Flu A being treated with Tamiflu.  Patient has been on cefepime for PNA and oxygen need is not improving.  Pharmacy has been consulted to transition from cefepime to Zosyn for aspiration PNA.  SCr bumped slightly to 1.44, afebrile, WBC WNL.  Plan: Zosyn EID 3.375gm IV Q8H Pharmacy will sign off and follow peripherally.  Thank you for the consult!  Height: 5\' 9"  (175.3 cm) Weight: 71.6 kg (157 lb 13.6 oz) IBW/kg (Calculated) : 70.7  Temp (24hrs), Avg:98.9 F (37.2 C), Min:97.9 F (36.6 C), Max:99.8 F (37.7 C)  Recent Labs  Lab 07/12/23 1617 07/12/23 1723 07/13/23 0343 07/13/23 2311 07/15/23 0555 07/16/23 0536 07/17/23 0438 07/18/23 0323  WBC  --   --  7.6  --  6.3 7.6 8.2 9.1  CREATININE  --   --  1.44* 1.41* 1.22 1.22 1.22 1.44*  LATICACIDVEN 4.7* 4.2*  --   --   --   --   --   --     Estimated Creatinine Clearance: 55.9 mL/min (A) (by C-G formula based on SCr of 1.44 mg/dL (H)).    No Known Allergies  CRO 3/6 > 3/10 Doxy 3/6 > 3/10 Azithro x1 3/6  Vanc 3/10>> Cefepime 3/10>> 3/12 Zosyn 3/12 >>  Tamiflu 3/7 >> (3/12)  3/6 Bcx: negF 3/6 4-plex PCR: Flu A +  3/10 MRSA PCR: neg  Rahm Minix D. Laney Potash, PharmD, BCPS, BCCCP 07/18/2023, 8:34 AM

## 2023-07-18 NOTE — Progress Notes (Addendum)
 NAME:  Christopher [name], MRN:  [id number], DOB:  04-25-1966, LOS: 6 ADMISSION DATE:  07/12/2023, CONSULTATION DATE:  3/10 REFERRING MD:  singh, CHIEF COMPLAINT:  resp failure    History of Present Illness:  This is a 58 year old male w/ hx as outlined below. Admitted from home on 3/6 w/ cc: 3d h/o cough, fever and increasing shortness of breath. No known sick contacts.  ED eval:  Hypoxic required escalation of supplemental oxygen for increased WOB. Was febrile (as high as 102F). Had elevated lactic acid >4. CXR clear, WBC 11.6, RVP positive for influenza A. PCT neg. BNP 115.  Administered IVFs, started on solumedrol, inhaled BDs and  abx. As well as tamiflu.   Was starting to feel better on 3/8, still SOB. Changed back to solumedrol for wheezing and on-going higher O2 needs (6 lpm),  3/9 CXR showing RLL airspace disease, SLP also consulted in case this represented aspiration given CVA Hx.  3/10 requiring 12-->15 liters FM.  CXR w/ marked progression of right sided airspace disease.  3/11 heated high flow. Marked distress. Desating to 70s. Slow to see sats rise. Moved to ICU for better nursing care   Pertinent  Medical History  HTN, COPD, tobacco abuse, CVA X 2, ETOH use.   Significant Hospital Events: Including procedures, antibiotic start and stop dates in addition to other pertinent events   3/6 admitted. Steroids started, placed on O2, nebs, tamiflu and doxy 3/7 added CTX x 4 days 3/9 CXR worse. Still on 6 lpm 3/10 Pulm asked to see for worsening resp failure echo:  EF 60 to 65% normal LV function no regional wall motion abnormality grade 1 diastolic dysfunction antibiotics were widened  Interim History / Subjective:  Could not tolerate BiPAP overnight due to claustrophobia Remain on high flow nasal cannula oxygen at 45 L 85% Stated feeling better denies cough Remain afebrile  Objective   Blood pressure 125/71, pulse 82, temperature 99.8 F (37.7 C), temperature source Oral, resp.  rate (!) 23, height 5\' 9"  (1.753 m), weight 71.6 kg, SpO2 93%.    FiO2 (%):  [70 %-100 %] 80 %   Intake/Output Summary (Last 24 hours) at 07/18/2023 0740 Last data filed at 07/18/2023 0700 Gross per 24 hour  Intake 876.07 ml  Output 2050 ml  Net -1173.93 ml   Filed Weights   07/12/23 1507 07/16/23 2030  Weight: 71.7 kg 71.6 kg    Examination: General: Acute on chronically ill-appearing male, lying on the bed HEENT: [place]/AT, eyes anicteric.  moist mucus membranes.  On heated high flow nasal cannula oxygen Neuro: Alert, awake following commands with dysarthric speech, moving all 4 extremities Chest: Reduced air entry all over, barrel chest, no wheezes or rhonchi Heart: Regular rate and rhythm, no murmurs or gallops Abdomen: Soft, nontender, nondistended, bowel sounds present Skin: No rash  Labs and images reviewed  Resolved Hospital Problem list     Assessment & Plan:  Acute respiratory failure with hypoxia Acute COPD exacerbation Sepsis due to right lower lobe pneumonia, could be aspiration, POA Influenza pneumonia Tobacco dependence Alcohol abuse Acute kidney injury due to aggressive diuresis Hyponatremia Chronic HFpEF Acute metabolic acidosis  Continue high flow nasal cannula oxygen, titrate with O2 sat goal 88-90% Patient failed BiPAP trial Continue Solu-Medrol 40 mg every 12 Continue nebs, scheduled every 6 hours Switch cefepime to Zosyn MRSA screen is negative patient received vancomycin x 1 Continue Tamiflu Continue droplet precautions Tobacco cessation counseling provided Watch for signs of  withdrawal Serum creatinine trended up from 1.2 now to 1.4 likely due to aggressive diuresis Echocardiogram is suggestive of normal EF with grade 1 diastolic dysfunction GDMT as tolerated Continue aspirin   Diet/type: Regular diet DVT prophylaxis: Subcu heparin GI prophylaxis: N/A Lines: N/A Foley: N/A Code Status:  full code Last date of multidisciplinary goals of  care discussion: 3/12, patient was updated at bedside, decision was to continue full scope of care   The patient is critically ill due to acute failure with hypoxia/acute COPD exacerbation/influenza and bacterial pneumonia.  Critical care was necessary to treat or prevent imminent or life-threatening deterioration.  Critical care was time spent personally by me on the following activities: development of treatment plan with patient and/or surrogate as well as nursing, discussions with consultants, evaluation of patient's response to treatment, examination of patient, obtaining history from patient or surrogate, ordering and performing treatments and interventions, ordering and review of laboratory studies, ordering and review of radiographic studies, pulse oximetry, re-evaluation of patient's condition and participation in multidisciplinary rounds.   During this encounter critical care time was devoted to patient care services described in this note for 38 minutes.     Cheri Fowler, MD [place] [facility] See Amion for pager If no response to pager, please call [phone] until 7pm After 7pm, Please call E-link [phone]

## 2023-07-18 NOTE — Progress Notes (Signed)
 Patient has refused to wear Bipap tonight. States he is claustrophobic and can't tolerate. Patient on HHFNC at this time. Patient advised on the importance of wearing Bipap

## 2023-07-19 DIAGNOSIS — J441 Chronic obstructive pulmonary disease with (acute) exacerbation: Secondary | ICD-10-CM | POA: Diagnosis not present

## 2023-07-19 DIAGNOSIS — J101 Influenza due to other identified influenza virus with other respiratory manifestations: Secondary | ICD-10-CM | POA: Diagnosis not present

## 2023-07-19 DIAGNOSIS — J9601 Acute respiratory failure with hypoxia: Secondary | ICD-10-CM | POA: Diagnosis not present

## 2023-07-19 LAB — CBC WITH DIFFERENTIAL/PLATELET
Abs Immature Granulocytes: 0.21 10*3/uL — ABNORMAL HIGH (ref 0.00–0.07)
Basophils Absolute: 0 10*3/uL (ref 0.0–0.1)
Basophils Relative: 0 %
Eosinophils Absolute: 0 10*3/uL (ref 0.0–0.5)
Eosinophils Relative: 0 %
HCT: 33.2 % — ABNORMAL LOW (ref 39.0–52.0)
Hemoglobin: 11.3 g/dL — ABNORMAL LOW (ref 13.0–17.0)
Immature Granulocytes: 2 %
Lymphocytes Relative: 6 %
Lymphs Abs: 0.6 10*3/uL — ABNORMAL LOW (ref 0.7–4.0)
MCH: 28.2 pg (ref 26.0–34.0)
MCHC: 34 g/dL (ref 30.0–36.0)
MCV: 82.8 fL (ref 80.0–100.0)
Monocytes Absolute: 0.5 10*3/uL (ref 0.1–1.0)
Monocytes Relative: 5 %
Neutro Abs: 8 10*3/uL — ABNORMAL HIGH (ref 1.7–7.7)
Neutrophils Relative %: 87 %
Platelets: 273 10*3/uL (ref 150–400)
RBC: 4.01 MIL/uL — ABNORMAL LOW (ref 4.22–5.81)
RDW: 12.9 % (ref 11.5–15.5)
WBC: 9.3 10*3/uL (ref 4.0–10.5)
nRBC: 0 % (ref 0.0–0.2)

## 2023-07-19 LAB — BASIC METABOLIC PANEL
Anion gap: 8 (ref 5–15)
BUN: 30 mg/dL — ABNORMAL HIGH (ref 6–20)
CO2: 24 mmol/L (ref 22–32)
Calcium: 8.4 mg/dL — ABNORMAL LOW (ref 8.9–10.3)
Chloride: 102 mmol/L (ref 98–111)
Creatinine, Ser: 1.38 mg/dL — ABNORMAL HIGH (ref 0.61–1.24)
GFR, Estimated: 59 mL/min — ABNORMAL LOW (ref >60)
Glucose, Bld: 148 mg/dL — ABNORMAL HIGH (ref 70–99)
Potassium: 4.3 mmol/L (ref 3.5–5.1)
Sodium: 134 mmol/L — ABNORMAL LOW (ref 135–145)

## 2023-07-19 LAB — GLUCOSE, CAPILLARY
Glucose-Capillary: 125 mg/dL — ABNORMAL HIGH (ref 70–99)
Glucose-Capillary: 138 mg/dL — ABNORMAL HIGH (ref 70–99)
Glucose-Capillary: 122 mg/dL — ABNORMAL HIGH (ref 70–99)
Glucose-Capillary: 114 mg/dL — ABNORMAL HIGH (ref 70–99)

## 2023-07-19 LAB — PROCALCITONIN: Procalcitonin: <0.1 ng/mL

## 2023-07-19 LAB — C-REACTIVE PROTEIN: CRP: 5.8 mg/dL — ABNORMAL HIGH (ref <1.0)

## 2023-07-19 LAB — MAGNESIUM: Magnesium: 2.3 mg/dL (ref 1.7–2.4)

## 2023-07-19 LAB — BRAIN NATRIURETIC PEPTIDE: B Natriuretic Peptide: 12.3 pg/mL (ref 0.0–100.0)

## 2023-07-19 MED ORDER — METHYLPREDNISOLONE SODIUM SUCC 40 MG IJ SOLR
40.0000 mg | Freq: Every day | INTRAMUSCULAR | Status: DC
  Administered 2023-07-20: 40 mg via INTRAVENOUS
  Filled 2023-07-19: qty 1

## 2023-07-19 MED ORDER — SODIUM CHLORIDE 0.9% FLUSH
10.0000 mL | Freq: Two times a day (BID) | INTRAVENOUS | Status: DC
  Administered 2023-07-19 – 2023-07-24 (×11): 10 mL

## 2023-07-19 MED ORDER — ORAL CARE MOUTH RINSE: 15.0000 mL | OROMUCOSAL | Status: DC | PRN

## 2023-07-19 NOTE — TOC Progression Note (Signed)
 Transition of Care [facility]) - Progression Note    Patient Details  Name: Christopher [name] MRN: [id number] Date of Birth: 12/09/1965  Transition of Care [facility]) CM/SW Contact  Tom-Johnson, Hershal Coria, RN Phone Number: 07/19/2023, 2:21 PM  Clinical Narrative:     CM spoke with patient at bedside about home health recommendations.  Patient on 15L HFNC, IV abx, Neb tx and IV Solumedrol. Does not use home O2.   From home with his sister, does not have children. Has two supportive siblings. Not employed and states he is not on disability. Does not drive, family transports to and from appointments.  Has a cane, walker and shower set at home.  PCP is Georganna Skeans, MD and uses  [facility] on [address]   Home health referral called in to [facility] as patient has no preference, Amy voiced acceptance and info on AVS.  Patient not Medically ready for discharge.  CM will continue to follow as patient progresses with care towards discharge.        Expected Discharge Plan and Services                                               Social Determinants of Health (SDOH) Interventions SDOH Screenings   Food Insecurity: No Food Insecurity (07/13/2023)  Housing: Low Risk  (07/13/2023)  Transportation Needs: No Transportation Needs (07/13/2023)  Utilities: Not At Risk (07/13/2023)  Alcohol Screen: Medium Risk (08/16/2022)  Depression (PHQ2-9): Low Risk  (06/05/2023)  Financial Resource Strain: Medium Risk (08/16/2022)  Physical Activity: Sufficiently Active (08/16/2022)  Social Connections: Socially Isolated (08/16/2022)  Stress: No Stress Concern Present (02/15/2023)  Tobacco Use: High Risk (07/12/2023)  Health Literacy: Inadequate Health Literacy (02/15/2023)    Readmission Risk Interventions     No data to display

## 2023-07-19 NOTE — Progress Notes (Signed)
 Physical Therapy Treatment Patient Details Name: Christopher [name] MRN: [id number] DOB: 11/30/1965 Today's Date: 07/19/2023   History of Present Illness 58 yo male admitted 3/6 due to fever, cough, SOB with flu A + and COPD exacerbation. 3/10 increased O2 requirements with HHFNC. PMHx: HTN, HLD, Lt MCA CVA, hx of cocaine abuse.    PT Comments  Pt pleasant, in chair on arrival and returned to bed for tx with RT end of session. Pt reports RUE weakness but did not impact his basic mobility this session. Pt on HHFNC 40L/50% FIO2 with SPO2 88% at rest and immediate drop to 84% with any activity, applied NRB 15L throughout mobility to allow pt to recover to 91% and even with both devices pt with SPO2 87% with limited exercise and transfers. Pt not currently able to ambulate and too fatigue to progress further. Will continue to follow.       If plan is discharge home, recommend the following: Help with stairs or ramp for entrance;Assist for transportation;Assistance with cooking/housework   Can travel by Doctor, hospital (measurements PT)    Recommendations for Other Services       Precautions / Restrictions Precautions Precautions: Fall;Other (comment) Recall of Precautions/Restrictions: Intact Precaution/Restrictions Comments: watch sats     Mobility  Bed Mobility Overal bed mobility: Modified Independent             General bed mobility comments: pt transitioned from sitting to supine with assist only for line management    Transfers Overall transfer level: Needs assistance   Transfers: Sit to/from Stand, Bed to chair/wheelchair/BSC Sit to Stand: Supervision   Step pivot transfers: Supervision       General transfer comment: supervision for line management x 2 from recliner. pt able to rise without UB support and transfer to bed with support    Ambulation/Gait               General Gait Details: unable due to [facility]  and desaturation   Stairs             Wheelchair Mobility     Tilt Bed    Modified Rankin (Stroke Patients Only)       Balance Overall balance assessment: Mild deficits observed, not formally tested                                          Communication Communication Communication: No apparent difficulties  Cognition Arousal: Alert Behavior During Therapy: WFL for tasks assessed/performed   PT - Cognitive impairments: No apparent impairments                                Cueing    Exercises General Exercises - Lower Extremity Hip ABduction/ADduction: AROM, Both, 20 reps, Supine, Strengthening Straight Leg Raises: AROM, Both, 20 reps, Supine, Strengthening Hip Flexion/Marching: AROM, 15 reps, Standing, Both, Strengthening    General Comments        Pertinent Vitals/Pain Pain Assessment Pain Assessment: No/denies pain    Home Living                          Prior Function            PT Goals (current goals can now  be found in the care plan section) Progress towards PT goals: Not progressing toward goals - comment (limited by cardiopulmonary function and O2 demand)    Frequency    Min 2X/week      PT Plan      Co-evaluation              AM-PAC PT "6 Clicks" Mobility   Outcome Measure  Help needed turning from your back to your side while in a flat bed without using bedrails?: None Help needed moving from lying on your back to sitting on the side of a flat bed without using bedrails?: None Help needed moving to and from a bed to a chair (including a wheelchair)?: A Little Help needed standing up from a chair using your arms (e.g., wheelchair or bedside chair)?: A Little Help needed to walk in hospital room?: A Little Help needed climbing 3-5 steps with a railing? : A Lot 6 Click Score: 19    End of Session Equipment Utilized During Treatment: Oxygen Activity Tolerance: Patient tolerated  treatment well Patient left: in bed;with call bell/phone within reach Nurse Communication: Mobility status PT Visit Diagnosis: Other abnormalities of gait and mobility (R26.89)     Time: 1610-9604 PT Time Calculation (min) (ACUTE ONLY): 21 min  Charges:    $Therapeutic Exercise: 8-22 mins PT General Charges $$ ACUTE PT VISIT: 1 Visit                     Merryl Hacker, PT Acute Rehabilitation Services Office: [phone]    Enedina Finner Myshawn Chir[place]oga 07/19/2023, 11:13 AM

## 2023-07-19 NOTE — Plan of Care (Signed)
   Problem: Education: Goal: Ability to describe self-care measures that may prevent or decrease complications (Diabetes Survival Skills Education) will improve Outcome: Progressing   Problem: Coping: Goal: Ability to adjust to condition or change in health will improve Outcome: Progressing   Problem: Fluid Volume: Goal: Ability to maintain a balanced intake and output will improve Outcome: Progressing

## 2023-07-19 NOTE — Plan of Care (Signed)
  Problem: Coping: Goal: Ability to adjust to condition or change in health will improve Outcome: Progressing   Problem: Metabolic: Goal: Ability to maintain appropriate glucose levels will improve Outcome: Progressing   Problem: Nutritional: Goal: Maintenance of adequate nutrition will improve Outcome: Progressing   Problem: Skin Integrity: Goal: Risk for impaired skin integrity will decrease Outcome: Progressing   Problem: Clinical Measurements: Goal: Diagnostic test results will improve Outcome: Progressing Goal: Respiratory complications will improve Outcome: Progressing Goal: Cardiovascular complication will be avoided Outcome: Progressing   Problem: Activity: Goal: Risk for activity intolerance will decrease Outcome: Progressing   Problem: Elimination: Goal: Will not experience complications related to bowel motility Outcome: Progressing Goal: Will not experience complications related to urinary retention Outcome: Progressing

## 2023-07-19 NOTE — Progress Notes (Signed)
 Pt removed from [facility] at this time and placed on 15L [place].  Pt VS did not change.  Sat remains in upper 80s, RR ~25-30.  RT will continue to monitor.  RN aware.

## 2023-07-19 NOTE — Progress Notes (Addendum)
 NAME:  Christopher [name], MRN:  [id number], DOB:  July 26, 1965, LOS: 7 ADMISSION DATE:  07/12/2023, CONSULTATION DATE:  3/10 REFERRING MD:  singh, CHIEF COMPLAINT:  resp failure    History of Present Illness:  This is a 58 year old male w/ hx as outlined below. Admitted from home on 3/6 w/ cc: 3d h/o cough, fever and increasing shortness of breath. No known sick contacts.  ED eval:  Hypoxic required escalation of supplemental oxygen for increased WOB. Was febrile (as high as 102F). Had elevated lactic acid >4. CXR clear, WBC 11.6, RVP positive for influenza A. PCT neg. BNP 115.  Administered IVFs, started on solumedrol, inhaled BDs and  abx. As well as tamiflu.   Was starting to feel better on 3/8, still SOB. Changed back to solumedrol for wheezing and on-going higher O2 needs (6 lpm),  3/9 CXR showing RLL airspace disease, SLP also consulted in case this represented aspiration given CVA Hx.  3/10 requiring 12-->15 liters FM.  CXR w/ marked progression of right sided airspace disease.  3/11 heated high flow. Marked distress. Desating to 70s. Slow to see sats rise. Moved to ICU for better nursing care   Pertinent  Medical History  HTN, COPD, tobacco abuse, CVA X 2, ETOH use.   Significant Hospital Events: Including procedures, antibiotic start and stop dates in addition to other pertinent events   3/6 admitted. Steroids started, placed on O2, nebs, tamiflu and doxy 3/7 added CTX x 4 days 3/9 CXR worse. Still on 6 lpm 3/10 Pulm asked to see for worsening resp failure echo:  EF 60 to 65% normal LV function no regional wall motion abnormality grade 1 diastolic dysfunction antibiotics were widened  Interim History / Subjective:  Patient refused BiPAP again overnight due to claustrophobia Currently on 40 L and 50% high flow nasal cannula oxygen with O2 sat in high 80s  Stated feeling better, denies cough He is afebrile  Objective   Blood pressure (!) 100/55, pulse 75, temperature 97.9 F (36.6  C), temperature source Oral, resp. rate (!) 23, height 5\' 9"  (1.753 m), weight 71.6 kg, SpO2 93%.    FiO2 (%):  [80 %-85 %] 80 %   Intake/Output Summary (Last 24 hours) at 07/19/2023 0743 Last data filed at 07/19/2023 0650 Gross per 24 hour  Intake 729.96 ml  Output 2050 ml  Net -1320.04 ml   Filed Weights   07/12/23 1507 07/16/23 2030  Weight: 71.7 kg 71.6 kg    Examination: General: Acute on chronically ill-appearing male, sitting on recliner, eating breakfast HEENT: [place]/AT, eyes anicteric.  moist mucus membranes Neuro: Alert, awake following commands.  Dysarthric with residual right-sided weakness from prior stroke Chest: Reduced air entry all over, no wheezes or rhonchi Heart: Regular rate and rhythm, no murmurs or gallops Abdomen: Soft, nontender, nondistended, bowel sounds present Skin: No rash   Labs and images reviewed  Resolved Hospital Problem list   Acute metabolic acidosis  Assessment & Plan:  Acute respiratory failure with hypoxia Acute COPD exacerbation Sepsis due to right lower lobe pneumonia, due to aspiration, POA Influenza pneumonia Tobacco dependence Alcohol abuse Acute kidney injury due to aggressive diuresis Hyponatremia Chronic HFpEF 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  Continue high flow nasal cannula oxygen, titrate with O2 sat goal 85-90% Patient has been refusing BiPAP due to claustrophobia Decrease Solu-Medrol to 40 mg once daily Continue nebs, scheduled every 6 hours Continue IV antibiotics with Zosyn Completed therapy with Tamiflu Continue droplet  precautions Sepsis improved Tobacco cessation counseling provided Watch for signs of withdrawal Serum creatinine started trending down after decreasing diuretics Will hold off on diuretics today Echocardiogram is suggestive of normal EF with grade 1 diastolic dysfunction GDMT as tolerated Continue aspirin and statin   Best practice Diet/type: Regular diet DVT  prophylaxis: Subcu heparin GI prophylaxis: N/A Lines: N/A Foley: N/A Code Status:  full code Last date of multidisciplinary goals of care discussion: 3/12, patient was updated at bedside, decision was to continue full scope of care   The patient is critically ill due to acute failure with hypoxia/acute COPD exacerbation/influenza and bacterial pneumonia.  Critical care was necessary to treat or prevent imminent or life-threatening deterioration.  Critical care was time spent personally by me on the following activities: development of treatment plan with patient and/or surrogate as well as nursing, discussions with consultants, evaluation of patient's response to treatment, examination of patient, obtaining history from patient or surrogate, ordering and performing treatments and interventions, ordering and review of laboratory studies, ordering and review of radiographic studies, pulse oximetry, re-evaluation of patient's condition and participation in multidisciplinary rounds.   During this encounter critical care time was devoted to patient care services described in this note for 34 minutes.     Cheri Fowler, MD [facility] See Amion for pager If no response to pager, please call [phone] until 7pm After 7pm, Please call E-link [phone]

## 2023-07-19 NOTE — Progress Notes (Signed)

## 2023-07-20 ENCOUNTER — Telehealth: Payer: Self-pay

## 2023-07-20 ENCOUNTER — Inpatient Hospital Stay (HOSPITAL_COMMUNITY)

## 2023-07-20 DIAGNOSIS — J441 Chronic obstructive pulmonary disease with (acute) exacerbation: Secondary | ICD-10-CM | POA: Diagnosis not present

## 2023-07-20 LAB — CBC WITH DIFFERENTIAL/PLATELET
Abs Immature Granulocytes: 0.41 10*3/uL — ABNORMAL HIGH (ref 0.00–0.07)
Basophils Absolute: 0 10*3/uL (ref 0.0–0.1)
Basophils Relative: 0 %
Eosinophils Absolute: 0 10*3/uL (ref 0.0–0.5)
Eosinophils Relative: 0 %
HCT: 35.1 % — ABNORMAL LOW (ref 39.0–52.0)
Hemoglobin: 11.9 g/dL — ABNORMAL LOW (ref 13.0–17.0)
Immature Granulocytes: 5 %
Lymphocytes Relative: 12 %
Lymphs Abs: 1 10*3/uL (ref 0.7–4.0)
MCH: 28.3 pg (ref 26.0–34.0)
MCHC: 33.9 g/dL (ref 30.0–36.0)
MCV: 83.6 fL (ref 80.0–100.0)
Monocytes Absolute: 0.3 10*3/uL (ref 0.1–1.0)
Monocytes Relative: 4 %
Neutro Abs: 6.5 10*3/uL (ref 1.7–7.7)
Neutrophils Relative %: 79 %
Platelets: 379 10*3/uL (ref 150–400)
RBC: 4.2 MIL/uL — ABNORMAL LOW (ref 4.22–5.81)
RDW: 12.9 % (ref 11.5–15.5)
WBC: 8.2 10*3/uL (ref 4.0–10.5)
nRBC: 0 % (ref 0.0–0.2)

## 2023-07-20 LAB — BASIC METABOLIC PANEL
Anion gap: 10 (ref 5–15)
BUN: 26 mg/dL — ABNORMAL HIGH (ref 6–20)
CO2: 23 mmol/L (ref 22–32)
Calcium: 8.5 mg/dL — ABNORMAL LOW (ref 8.9–10.3)
Chloride: 102 mmol/L (ref 98–111)
Creatinine, Ser: 1.3 mg/dL — ABNORMAL HIGH (ref 0.61–1.24)
GFR, Estimated: >60 mL/min (ref >60)
Glucose, Bld: 111 mg/dL — ABNORMAL HIGH (ref 70–99)
Potassium: 3.9 mmol/L (ref 3.5–5.1)
Sodium: 135 mmol/L (ref 135–145)

## 2023-07-20 LAB — BLOOD GAS, ARTERIAL
Acid-Base Excess: 2.2 mmol/L — ABNORMAL HIGH (ref 0.0–2.0)
Bicarbonate: 25.1 mmol/L (ref 20.0–28.0)
O2 Saturation: 92.8 %
Patient temperature: 37.7
pCO2 arterial: 34 mmHg (ref 32–48)
pH, Arterial: 7.48 — ABNORMAL HIGH (ref 7.35–7.45)
pO2, Arterial: 64 mmHg — ABNORMAL LOW (ref 83–108)

## 2023-07-20 LAB — PROCALCITONIN: Procalcitonin: 0.19 ng/mL

## 2023-07-20 LAB — GLUCOSE, CAPILLARY
Glucose-Capillary: 111 mg/dL — ABNORMAL HIGH (ref 70–99)
Glucose-Capillary: 128 mg/dL — ABNORMAL HIGH (ref 70–99)
Glucose-Capillary: 99 mg/dL (ref 70–99)

## 2023-07-20 LAB — TROPONIN I (HIGH SENSITIVITY): Troponin I (High Sensitivity): 10 ng/L (ref <18)

## 2023-07-20 MED ORDER — PREDNISONE 20 MG PO TABS
40.0000 mg | ORAL_TABLET | Freq: Every day | ORAL | Status: DC
  Administered 2023-07-21 – 2023-07-24 (×4): 40 mg via ORAL
  Filled 2023-07-20 (×4): qty 2

## 2023-07-20 MED ORDER — LORAZEPAM 2 MG/ML IJ SOLN
1.0000 mg | Freq: Once | INTRAMUSCULAR | Status: AC | PRN
  Administered 2023-07-20: 1 mg via INTRAVENOUS
  Filled 2023-07-20: qty 1

## 2023-07-20 NOTE — Progress Notes (Signed)
 Patient refusing to wear oxygen and insisting that we "just let him go". Multiple attempts to educate patient & encourage him into putting oxygen back on but patient continued to refuse. MD informed. Palliative & psych consulted. Patient is of sound mind as evidenced by patient confirming his name, birthday, current year and that he's in the hospital. 1 mg ativan given per Mahala Menghini, MD orders.

## 2023-07-20 NOTE — Progress Notes (Signed)
 Spoke with patient with [employer] nurse present pt refuse oxygen stating he was tried and was just ready  to pass on.  Patient alert and oriented x 4. And states he is comfortable ans had no needs. At the present

## 2023-07-20 NOTE — Progress Notes (Signed)
 Patient re-eval with CCM He seems to be working with a RR~ 30 x min. He tells me the date time year correctly and understands implications of what myself and CCM MD, Dr. Merrily Pew has mentioned  I will give him a small dose of ativan as he keeps repeating "leave me alone, I wanna go" in response to our questions  If he continues to refuse treatment, he might need palliative involvement and consideration for comfort trajectory--he does seem to have capacity to refuse therapy  I would place him on comfort measures like morphine if after ativan and further clarification he refuses therapy  I had a phone call with his sister peggy [phone] and apprised her of situation  Pallaitve and Psych have been consulted  Pleas Koch, MD [facility] 2:14 PM

## 2023-07-20 NOTE — Progress Notes (Signed)
 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    Plan  Acute hypoxic respiratory failure H influenza on admission with sepsis from this Intolerant of BiPAP-getting chest x-ray as short of breath with some chest pain this morning-SLP evaluated on 3/10 and did not feel needed any change in diet and recommended regular texture thin liquid Received broad-spectrum antibiotics doxycycline ceftriaxone for 7 days ending 3/11 and was then placed on Zosyn 3/12 presumably to complete 5 days therapy He is still short of breath this morning requiring heated high flow FiO2 85% flow 50 He is intolerant of BiPAP ABG is pending Chest x-ray still shows linear opacity across right lung field on my overview which might represent pneumonia I do not think that he has any other diagnoses such as PE at this time Continues on Solu-Medrol 40 daily IV inhalers Brovana 15 twice daily, Pulmicort 0.25 twice daily, DuoNeb 3 mL every 6 and Incruse Ellipta  Chest pain EF this admission 60-65% grade 1 DD BNP slight elevation earlier in hospital stay 115 Previously was trialed on Lasix which was held because of iatrogenic AKI EKG my  over read showed only sinus rhythm with PVC troponin is pending Might resume low-dose Lasix and reeval  Multiple strokes 1 given tPA Continues aspirin 81, Lipitor 20 Risk factors continued tobacco, cocaine-urged to desist   electrolyte abnormality with severe hypokalemia on admission Resolved   DVT prophylaxis: Heparin  Status is: Inpatient Remains inpatient appropriate because:   Unable to wean will need some time to get better      Subjective: Called to bedside by nursing given patient having chest pain He states its only when he takes a deep breath No radiation Did note increasing oxygen requirements overnight but may be a component of anxiety also Talking in full sentences   Objective + exam Vitals:   07/20/23 0338 07/20/23 0400 07/20/23 0500 07/20/23 0600  BP:  (!) 120/58 139/69 125/65  Pulse:  86 (!) 101 92  Resp:  (!) 32 (!) 30 (!) 31  Temp: 99.7 F (37.6 C)     TempSrc: Oral     SpO2:  (!) 88% 91% (!) 87%  Weight:      Height:       Filed Weights   07/12/23 1507 07/16/23 2030  Weight: 71.7 kg 71.6 kg    Examination: EOMI NCAT PERRLA Chronically ill-appearing white male Crackles posterolaterally in the posterior lung fields S1-S2 tachycardic low 100 range seems to be predominantly sinus Abdomen soft no rebound No JVD No lower extremity edema Power 5/5  Data Reviewed: reviewed   CBC    Component Value Date/Time   WBC 8.2 07/20/2023 0410  RBC 4.20 (L) 07/20/2023 0410   HGB 11.9 (L) 07/20/2023 0410   HGB 13.1 03/19/2023 0000   HCT 35.1 (L) 07/20/2023 0410   HCT 41.0 03/19/2023 0000   PLT 379 07/20/2023 0410   PLT 326 03/19/2023 0000   MCV 83.6 07/20/2023 0410   MCV 88 03/19/2023 0000   MCH 28.3 07/20/2023 0410   MCHC 33.9 07/20/2023 0410   RDW 12.9 07/20/2023 0410   RDW 11.8 03/19/2023 0000   LYMPHSABS 1.0 07/20/2023 0410   LYMPHSABS 2.0 03/19/2023 0000   MONOABS 0.3 07/20/2023 0410   EOSABS 0.0 07/20/2023 0410   EOSABS 0.2  03/19/2023 0000   BASOSABS 0.0 07/20/2023 0410   BASOSABS 0.1 03/19/2023 0000      Latest Ref Rng & Units 07/20/2023    4:10 AM 07/19/2023    4:09 AM 07/18/2023    3:23 AM  CMP  Glucose 70 - 99 mg/dL 213  086  578   BUN 6 - 20 mg/dL 26  30  29    Creatinine 0.61 - 1.24 mg/dL 4.69  6.29  5.28   Sodium 135 - 145 mmol/L 135  134  133   Potassium 3.5 - 5.1 mmol/L 3.9  4.3  4.3   Chloride 98 - 111 mmol/L 102  102  100   CO2 22 - 32 mmol/L 23  24  21    Calcium 8.9 - 10.3 mg/dL 8.5  8.4  8.4     Scheduled Meds:  arformoterol  15 mcg Nebulization BID   aspirin EC  81 mg Oral Daily   atorvastatin  20 mg Oral QHS   budesonide (PULMICORT) nebulizer solution  0.25 mg Nebulization BID   Chlorhexidine Gluconate Cloth  6 each Topical Daily   folic acid  1 mg Oral Daily   guaiFENesin  1,200 mg Oral BID   heparin  5,000 Units Subcutaneous Q8H   insulin aspart  0-9 Units Subcutaneous TID WC   ipratropium-albuterol  3 mL Nebulization Q6H   methylPREDNISolone (SOLU-MEDROL) injection  40 mg Intravenous Daily   multivitamin with minerals  1 tablet Oral Daily   pantoprazole  40 mg Oral QHS   sodium chloride flush  10-40 mL Intracatheter Q12H   thiamine  100 mg Oral Daily   umeclidinium bromide  1 puff Inhalation Daily   Continuous Infusions:  piperacillin-tazobactam (ZOSYN)  IV 3.375 g (07/20/23 0003)    Time  40  Rhetta Mura, MD  [facility]

## 2023-07-20 NOTE — Consult Note (Signed)
 This provider visited the client who was drowsy on assessment.  He was lying in bed with a pillow over his abdomen in no distress.  After introductions, I asked if he needed anything and he shook his head no and when asked if I could place his oxygen on him to assist his breathing, he shook his head no, despite encouragements.  Psych will continue to follow.  Nanine Means, PMHNP

## 2023-07-20 NOTE — Progress Notes (Signed)
 Pt agrees to take medications, and allow me to place oxygen- 15 L Via HF nasal cannula placed.

## 2023-07-20 NOTE — Telephone Encounter (Signed)
 Debbie Smalls calling from [facility] is calling to report that the patient will be discharged from the hospital within 1-2 days. Will Dr Andrey Campanile be willing to sign HH orders? [phone]

## 2023-07-20 NOTE — Progress Notes (Signed)
 This RT called to room by RN who noticed pt to having increase in WOB, tachypneic(RR 35-40), with Spo2 82-85% on 15L HFNC. Pt stil adamantly refusing to wear a Bipap due to claustrophobia. Convinced patient to try a NRB mask at 15L to recover.  Sp02 increased to 90% but pt remained tachypneic w/ increase WOB. Pt returned to [facility] requiring initial settings of 50L/85% to make a difference in breathing.  RR now 27-29, Spo2 90% and WOB decreased. Will monitor and wean as able.

## 2023-07-20 NOTE — Progress Notes (Addendum)
 This RN helped patient ambulate to the bathroom. During and after ambulation, patient was breathing in the 40's and very SOB. Once back in bed, patient started making comments like "just take me out", "I'm ready to go see my mama" (who passed away over 20 years ago), and "if it's my time, it's my time, let me go". I ask the patient if his heart were to stop while he's in the hospital, does he want compressions, intubation and medications, to which he declined all. Mahala Menghini, MD informed of RN's observations and will address shortly.

## 2023-07-20 NOTE — Plan of Care (Signed)
  Problem: Coping: Goal: Ability to adjust to condition or change in health will improve Outcome: Not Progressing   Problem: Nutritional: Goal: Maintenance of adequate nutrition will improve Outcome: Not Progressing   Problem: Clinical Measurements: Goal: Respiratory complications will improve Outcome: Not Progressing Goal: Cardiovascular complication will be avoided Outcome: Not Progressing   Problem: Coping: Goal: Level of anxiety will decrease Outcome: Not Progressing

## 2023-07-20 NOTE — Progress Notes (Signed)
 Paged to provider on call floor coverage via amion to come to beside and speak to patient concerning goals. Patient O2 sats are declining ans RN want the doctor on call to hear so that comfort order can be placed in the event patient becomes incoherent. Patient is currently alert and oriented.

## 2023-07-20 NOTE — Progress Notes (Signed)
 58 year old male who was admitted with acute respiratory failure with hypoxia due to COPD exacerbation and sepsis due to right lower lobe pneumonia and influenza pneumonia  Patient oxygen requirement is improving, sepsis improved, he was transferred to hospitalist service on 3/13.  This afternoon patient started refusing oxygen and medical management kept saying " leave me alone" I went into the room evaluated the patient, I asked him his full name, he answered correctly, year and month and current location, he answered all question correctly and just kept saying he is done just leave me alone and let me die peacefully.  I reevaluated him along with Dr. Mahala Menghini and we both agreed patient has decision-making capacity but he is frustrated and wants comfort care.  I agree with trying out small dose of Ativan followed by morphine to see if changes his mind.  Psychiatry and palliative care are consulted  Patient's sister was updated at bedside, I encouraged her to talk to him as well.  Patient's oxygen saturation is 80% on room air, he is tachypneic into 30s but refusing treatment   Will await psych and palliative care consult.     Cheri Fowler, MD [facility] See Amion for pager If no response to pager, please call [phone] until 7pm After 7pm, Please call E-link [phone]

## 2023-07-21 DIAGNOSIS — F331 Major depressive disorder, recurrent, moderate: Secondary | ICD-10-CM | POA: Diagnosis not present

## 2023-07-21 DIAGNOSIS — J441 Chronic obstructive pulmonary disease with (acute) exacerbation: Secondary | ICD-10-CM | POA: Diagnosis not present

## 2023-07-21 LAB — GLUCOSE, CAPILLARY
Glucose-Capillary: 116 mg/dL — ABNORMAL HIGH (ref 70–99)
Glucose-Capillary: 183 mg/dL — ABNORMAL HIGH (ref 70–99)
Glucose-Capillary: 175 mg/dL — ABNORMAL HIGH (ref 70–99)
Glucose-Capillary: 174 mg/dL — ABNORMAL HIGH (ref 70–99)

## 2023-07-21 MED ORDER — IPRATROPIUM-ALBUTEROL 0.5-2.5 (3) MG/3ML IN SOLN
3.0000 mL | Freq: Two times a day (BID) | RESPIRATORY_TRACT | Status: DC
  Administered 2023-07-21 – 2023-07-24 (×6): 3 mL via RESPIRATORY_TRACT
  Filled 2023-07-21 (×6): qty 3

## 2023-07-21 MED ORDER — FUROSEMIDE 10 MG/ML IJ SOLN
40.0000 mg | Freq: Once | INTRAMUSCULAR | Status: AC
  Administered 2023-07-21: 40 mg via INTRAVENOUS
  Filled 2023-07-21: qty 4

## 2023-07-21 MED ORDER — ESCITALOPRAM OXALATE 10 MG PO TABS
10.0000 mg | ORAL_TABLET | Freq: Every day | ORAL | Status: DC
Start: 2023-07-21 — End: 2023-07-24
  Administered 2023-07-21 – 2023-07-24 (×4): 10 mg via ORAL
  Filled 2023-07-21 (×4): qty 1

## 2023-07-21 NOTE — Plan of Care (Signed)
  Problem: Coping: Goal: Ability to adjust to condition or change in health will improve Outcome: Progressing   Problem: Metabolic: Goal: Ability to maintain appropriate glucose levels will improve Outcome: Progressing   Problem: Nutritional: Goal: Maintenance of adequate nutrition will improve Outcome: Progressing   Problem: Tissue Perfusion: Goal: Adequacy of tissue perfusion will improve Outcome: Progressing   Problem: Clinical Measurements: Goal: Respiratory complications will improve Outcome: Progressing Goal: Cardiovascular complication will be avoided Outcome: Progressing

## 2023-07-21 NOTE — Progress Notes (Signed)
 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 3/14 CXR shows persistent PNA   Plan  Acute hypoxic respiratory failure H influenza on admission with sepsis from this SLP evaluated on 3/10 and did not feel needed any change in diet and recommended regular texture thin liquid Received broad-spectrum antibiotics doxycycline ceftriaxone for 7 days ending 3/11 and was then placed on Zosyn 3/12 presumably to complete 5 days therapy requiring heated high flow FiO2 85% flow 50 He is intolerant of BiPAP ABG is pending Chest x-ray 3/14still shows linear opacity across right lung field on my overview which might represent pneumonia Solu-Medrol switched to prednisone 40-continue inhalers Brovana 15 twice daily, Pulmicort 0.25 twice daily, DuoNeb 3 mL every 6 and Incruse Ellipta and Mucinex as able  Chest pain EF this admission 60-65% grade 1 DD BNP slight elevation earlier in hospital stay 115 Previously was trialed on Lasix which was held because of iatrogenic AKI EKG my over read showed only sinus rhythm with PVC troponin is pending Will give lasix 40 daily IV and re-eval respiratory effort  Multiple strokes  1 given tPA Continues aspirin 81, Lipitor 20 Risk factors continued tobacco, cocaine-urged to desist  Depression, noncompliance with therapy Discussed with me on 3/14 and with critical care that "he just wanted to die" he seems depressed and psychiatry as well as palliative care were consulted He tells me that he does not wish to be placed on machines I will ask palliative care to see I have explained to him clearly that there is no middle-of-the-road option for him-he is not stable for discharge home unless he decides to go home with hospice-if he were to go home and want medical attention he would have to come right back to the hospital if he got sick Palliative care to delineate goals of care  Impaired glucose tolerance probably from steroids    electrolyte abnormality with severe hypokalemia on admission Resolved   DVT prophylaxis: Heparin  Status is: Inpatient Remains inpatient appropriate because:   Unable to wean will need some time to get better If he decides to go home with hospice we will need to order equipment   Subjective:  No further chest pain no fever no nausea no vomiting Compliant with meds as well as oxygen by nasal cannula this morning We had a discussion about further goals and he is going to think about some of what we discussed as above   Objective + exam Vitals:   07/21/23 0700 07/21/23 0757 07/21/23 0800 07/21/23 0857  BP: 128/62  119/74   Pulse: 84  97 93  Resp: (!) 29  (!) 24 (!) 29  Temp:  98.3 F (36.8 C)  TempSrc:  Oral    SpO2: (!) 85%  (!) 84% (!) 86%  Weight:      Height:       Filed Weights   07/12/23 1507 07/16/23 2030  Weight: 71.7 kg 71.6 kg    Examination:  Chronically ill no distress EOMI NCAT No rales rhonchi or wheeze posteriorly decreased air entry ROM intact moving 4 limbs equally Power 5/5 Abdomen soft no rebound no guarding No lower extremity edema  Data Reviewed: reviewed   CBC    Component Value Date/Time    WBC 8.2 07/20/2023 0410   RBC 4.20 (L) 07/20/2023 0410   HGB 11.9 (L) 07/20/2023 0410   HGB 13.1 03/19/2023 0000   HCT 35.1 (L) 07/20/2023 0410   HCT 41.0 03/19/2023 0000   PLT 379 07/20/2023 0410   PLT 326 03/19/2023 0000   MCV 83.6 07/20/2023 0410   MCV 88 03/19/2023 0000   MCH 28.3 07/20/2023 0410   MCHC 33.9 07/20/2023 0410   RDW 12.9 07/20/2023 0410   RDW 11.8 03/19/2023 0000   LYMPHSABS 1.0 07/20/2023 0410   LYMPHSABS 2.0 03/19/2023 0000   MONOABS 0.3 07/20/2023 0410   EOSABS 0.0 07/20/2023 0410   EOSABS 0.2 03/19/2023 0000   BASOSABS 0.0 07/20/2023 0410   BASOSABS 0.1 03/19/2023 0000      Latest Ref Rng & Units 07/20/2023    4:10 AM 07/19/2023    4:09 AM 07/18/2023    3:23 AM  CMP  Glucose 70 - 99 mg/dL 191  478  295   BUN 6 - 20 mg/dL 26  30  29    Creatinine 0.61 - 1.24 mg/dL 6.21  3.08  6.57   Sodium 135 - 145 mmol/L 135  134  133   Potassium 3.5 - 5.1 mmol/L 3.9  4.3  4.3   Chloride 98 - 111 mmol/L 102  102  100   CO2 22 - 32 mmol/L 23  24  21    Calcium 8.9 - 10.3 mg/dL 8.5  8.4  8.4     Scheduled Meds:  arformoterol  15 mcg Nebulization BID   aspirin EC  81 mg Oral Daily   atorvastatin  20 mg Oral QHS   budesonide (PULMICORT) nebulizer solution  0.25 mg Nebulization BID   Chlorhexidine Gluconate Cloth  6 each Topical Daily   folic acid  1 mg Oral Daily   furosemide  40 mg Intravenous Once   guaiFENesin  1,200 mg Oral BID   heparin  5,000 Units Subcutaneous Q8H   insulin aspart  0-9 Units Subcutaneous TID WC   ipratropium-albuterol  3 mL Nebulization Q6H   multivitamin with minerals  1 tablet Oral Daily   pantoprazole  40 mg Oral QHS   predniSONE  40 mg Oral QAC breakfast   sodium chloride flush  10-40 mL Intracatheter Q12H   thiamine  100 mg Oral Daily   umeclidinium bromide  1 puff Inhalation Daily   Continuous Infusions:  piperacillin-tazobactam (ZOSYN)  IV 3.375 g (07/21/23 0610)    Time  22  Rhetta Mura, MD  [facility]

## 2023-07-21 NOTE — Consult Note (Signed)
 [facility] Psychiatric Consult Initial  Patient Name: .JADARIUS COMMONS  MRN: [id number]  DOB: 11-29-1965  Consult Order details:  Orders (From admission, onward)     Start     Ordered   07/20/23 1235  IP CONSULT TO PSYCHIATRY       Ordering Provider: Rhetta Mura, MD  Provider:  (Not yet assigned)  Question Answer Comment  Location [facility]   Reason for Consult? suicidality      07/20/23 1235             Mode of Visit: In person    Psychiatry Consult Evaluation  Service Date: July 21, 2023 LOS:  LOS: 9 days  Chief Complaint "I'm alright"  Primary Psychiatric Diagnoses  Major depressive disorder, recurrent, moderate   Assessment  GABRIEL CONRY is a 58 y.o. male admitted: Medicallyfor 07/12/2023  2:59 PM with medical history significant of  HTN, COPD, CVA x 2, ongoing tobacco and alcohol use who reports 3-day history of fever to 102, cough, increasing shortness of breath. He was treated for the flu and sepsis.  Psychiatric history of cocaine and polysubstance use d/o.   He meets criteria for MDD based on low energy, decreased motivation, depressed mood for a couple of weeks, and passive suicidal ideations.  Current outpatient psychotropic medications include none.  On initial examination, patient was watching television in no distress. Please see plan below for detailed recommendations.   Diagnoses:  Active Hospital problems: Principal Problem:   COPD exacerbation (HCC) Active Problems:   AKI (acute kidney injury) (HCC)   Tobacco abuse   Essential hypertension   Dyslipidemia   Gastroesophageal reflux disease without esophagitis   Hemiparesis affecting right side as late effect of cerebrovascular accident (CVA) (HCC)   Hypokalemia   Major depressive disorder, recurrent episode, moderate (HCC)    Plan   ## Psychiatric Medication Recommendations:  Lexapro 10 mg daily started  ## Medical Decision Making Capacity: Not specifically  addressed in this encounter  ## Further Work-up:  -- most recent EKG on 07/20/2023 had QtC of 430 -- Pertinent labwork reviewed earlier this admission includes: CBC with diff, CMP, UDS, EKG, U/A   ## Disposition:-- There are no psychiatric contraindications to discharge at this time  ## Behavioral / Environmental: -Utilize compassion and acknowledge the patient's experiences while setting clear and realistic expectations for care.    ## Safety and Observation Level:  - Based on my clinical evaluation, I estimate the patient to be at low risk of self harm in the current setting. - At this time, we recommend  routine. This decision is based on my review of the chart including patient's history and current presentation, interview of the patient, mental status examination, and consideration of suicide risk including evaluating suicidal ideation, plan, intent, suicidal or self-harm behaviors, risk factors, and protective factors. This judgment is based on our ability to directly address suicide risk, implement suicide prevention strategies, and develop a safety plan while the patient is in the clinical setting. Please contact our team if there is a concern that risk level has changed.  CSSR Risk Category:C-SSRS RISK CATEGORY: No Risk  Suicide Risk Assessment: Patient has following modifiable risk factors for suicide: untreated depression, which we are addressing by starting medication. Patient has following non-modifiable or demographic risk factors for suicide: male gender Patient has the following protective factors against suicide: Supportive family, no history of suicide attempts, and no history of NSSIB  Thank you for this consult request.  Recommendations have been communicated to the primary team.  We will sign off at this time.   Nanine Means, NP       History of Present Illness  Relevant Aspects of [facility] Course:  Admitted on 07/12/2023 for 58 y.o. male with medical history  significant of  HTN, COPD, CVA x 2, ongoing tobacco and alcohol use who reports 3-day history of fever to 102, cough, increasing shortness of breath. They were treated for the flu and sepsis, improving.  Patient Report:  The client was watching television in his room prior to the assessment with his oxygen in place.  He did report depression at a moderate level with passive suicidal ideations at times, no active thoughts or intent.  Denied past suicide attempts and psychiatric admissions.  Mild anxiety related to health concerns.  Yesterday, he refused his oxygen in the afternoon and he was visited by this NP.  He was drowsy at that time and did not want his O2.  Later, he agreed to have it and has been using since then.  Yesterday, he was tired and appeared frustrated.  Today, there is no frustration voiced.  No hallucinations.  Past use of substances, predominately cocaine, no cravings.  Psych will start medications and sign off at this time, no other needs voiced.  Psych ROS:  Depression: moderate Anxiety:  mild Mania (lifetime and current): denied Psychosis: (lifetime and current): denied  Collateral information:  Contacted Peggy McCuiston at 12:22 pm on 07/21/2023.  Message left, will continue to reach her for any safety concerns.  Review of Systems  Constitutional: Negative.   HENT: Negative.    Eyes: Negative.   Respiratory:  Positive for shortness of breath.   Cardiovascular: Negative.   Gastrointestinal: Negative.   Genitourinary: Negative.   Musculoskeletal: Negative.   Skin: Negative.   Neurological:  Positive for weakness.  Endo/Heme/Allergies: Negative.   Psychiatric/Behavioral:  Positive for depression. The patient is nervous/anxious.      Psychiatric and Social History  Psychiatric History:  Information collected from patient, sister, and chart  Prev Dx/Sx: none Current Psych Provider: none Home Meds (current): none Previous Med Trials: none Therapy: none  Prior  Psych Hospitalization: denied  Prior Self Harm: denied Prior Violence: denied  Family Psych History: denied Family Hx suicide: denied  Social History:  Occupational Hx: disabled Legal Hx: denied Living Situation: lives with his sister, no issues  Access to weapons/lethal means: denied   Substance History Alcohol: denied  Illicit drugs: cocaine in the past Prescription drug abuse: denied  Exam Findings  Physical Exam:  Vital Signs:  Temp:  [98.2 F (36.8 C)-99.4 F (37.4 C)] 99.4 F (37.4 C) (03/15 1112) Pulse Rate:  [78-97] 93 (03/15 0857) Resp:  [21-49] 29 (03/15 0857) BP: (97-140)/(57-78) 119/74 (03/15 0800) SpO2:  [75 %-94 %] 86 % (03/15 0857) Blood pressure 119/74, pulse 93, temperature 99.4 F (37.4 C), temperature source Oral, resp. rate (!) 29, height 5\' 9"  (1.753 m), weight 71.6 kg, SpO2 (!) 86%. Body mass index is 23.31 kg/m.  Physical Exam  Mental Status Exam: General Appearance: Disheveled  Orientation:  Full (Time, Place, and Person)  Memory:  Immediate;   Fair Recent;   Fair Remote;   Fair  Concentration:  Concentration: Fair and Attention Span: Fair  Recall:  Good  Attention  Good  Eye Contact:  Fair  Speech:  Slow  Language:  Good  Volume:  Decreased  Mood: depressed, moderate  Affect:  Congruent  Thought Process:  Coherent  Thought Content:  Logical  Suicidal Thoughts:  No  Homicidal Thoughts:  No  Judgement:  Fair  Insight:  Fair  Psychomotor Activity:  Decreased  Akathisia:  No  Fund of Knowledge:  Fair      Assets:  Housing Leisure Time Resilience Social Support  Cognition:  WNL  ADL's:  Impaired  AIMS (if indicated):        Other History   These have been pulled in through the EMR, reviewed, and updated if appropriate.  Family History:  The patient's family history includes Cancer in his mother; Stroke in his father.  Medical History: Past Medical History:  Diagnosis Date   GERD (gastroesophageal reflux disease)     Hyperlipidemia    Hypertension    PUD (peptic ulcer disease) 2008   treated with meds   Stroke (HCC) 01/2021   Stroke (HCC) 09/2021    Surgical History: Past Surgical History:  Procedure Laterality Date   CLAVICLE SURGERY Right    fracture repair   INGUINAL HERNIA REPAIR Right    age 46   rupture ulcer       Medications:   Current Facility-Administered Medications:    acetaminophen (TYLENOL) tablet 650 mg, 650 mg, Oral, Q6H PRN, 650 mg at 07/14/23 0500 **OR** acetaminophen (TYLENOL) suppository 650 mg, 650 mg, Rectal, Q6H PRN, Simonne Martinet, NP   arformoterol (BROVANA) nebulizer solution 15 mcg, 15 mcg, Nebulization, BID, Simonne Martinet, NP, 15 mcg at 07/21/23 0857   aspirin EC tablet 81 mg, 81 mg, Oral, Daily, Chand, Sudham, MD, 81 mg at 07/21/23 1055   atorvastatin (LIPITOR) tablet 20 mg, 20 mg, Oral, QHS, Chand, Sudham, MD, 20 mg at 07/20/23 2236   budesonide (PULMICORT) nebulizer solution 0.25 mg, 0.25 mg, Nebulization, BID, Zenia Resides E, NP, 0.25 mg at 07/21/23 0857   Chlorhexidine Gluconate Cloth 2 % PADS 6 each, 6 each, Topical, Daily, Mannam, Praveen, MD, 6 each at 07/19/23 1010   folic acid (FOLVITE) tablet 1 mg, 1 mg, Oral, Daily, Zenia Resides E, NP, 1 mg at 07/21/23 1055   guaiFENesin (MUCINEX) 12 hr tablet 1,200 mg, 1,200 mg, Oral, BID, Zenia Resides E, NP, 1,200 mg at 07/21/23 1055   heparin injection 5,000 Units, 5,000 Units, Subcutaneous, Q8H, Simonne Martinet, NP, 5,000 Units at 07/21/23 0606   insulin aspart (novoLOG) injection 0-9 Units, 0-9 Units, Subcutaneous, TID WC, Simonne Martinet, NP, 1 Units at 07/20/23 1225   ipratropium-albuterol (DUONEB) 0.5-2.5 (3) MG/3ML nebulizer solution 3 mL, 3 mL, Nebulization, Q6H, Simonne Martinet, NP, 3 mL at 07/21/23 0857   multivitamin with minerals tablet 1 tablet, 1 tablet, Oral, Daily, Simonne Martinet, NP, 1 tablet at 07/21/23 1055   ondansetron (ZOFRAN) tablet 4 mg, 4 mg, Oral, Q6H PRN **OR** ondansetron  (ZOFRAN) injection 4 mg, 4 mg, Intravenous, Q6H PRN, Simonne Martinet, NP   Oral care mouth rinse, 15 mL, Mouth Rinse, PRN, Cheri Fowler, MD   oxyCODONE (Oxy IR/ROXICODONE) immediate release tablet 5 mg, 5 mg, Oral, Q4H PRN, Simonne Martinet, NP   pantoprazole (PROTONIX) EC tablet 40 mg, 40 mg, Oral, QHS, Zenia Resides E, NP, 40 mg at 07/20/23 2237   piperacillin-tazobactam (ZOSYN) IVPB 3.375 g, 3.375 g, Intravenous, Q8H, Samtani, Jai-Gurmukh, MD, Last Rate: 12.5 mL/hr at 07/21/23 0610, 3.375 g at 07/21/23 0610   polyethylene glycol (MIRALAX / GLYCOLAX) packet 17 g, 17 g, Oral, Daily PRN, Simonne Martinet, NP   predniSONE (DELTASONE) tablet 40 mg, 40  mg, Oral, QAC breakfast, Rhetta Mura, MD, 40 mg at 07/21/23 0748   sodium chloride flush (NS) 0.9 % injection 10-40 mL, 10-40 mL, Intracatheter, Q12H, Chand, Sudham, MD, 10 mL at 07/21/23 1056   thiamine (VITAMIN B1) tablet 100 mg, 100 mg, Oral, Daily, 100 mg at 07/21/23 1055 **OR** [DISCONTINUED] thiamine (VITAMIN B1) injection 100 mg, 100 mg, Intravenous, Daily, Reva Bores, MD   umeclidinium bromide (INCRUSE ELLIPTA) 62.5 MCG/ACT 1 puff, 1 puff, Inhalation, Daily, Simonne Martinet, NP, 1 puff at 07/21/23 3244  Allergies: No Known Allergies  Nanine Means, NP

## 2023-07-22 DIAGNOSIS — Z7189 Other specified counseling: Secondary | ICD-10-CM

## 2023-07-22 DIAGNOSIS — Z515 Encounter for palliative care: Secondary | ICD-10-CM

## 2023-07-22 DIAGNOSIS — J1100 Influenza due to unidentified influenza virus with unspecified type of pneumonia: Secondary | ICD-10-CM | POA: Diagnosis not present

## 2023-07-22 DIAGNOSIS — J441 Chronic obstructive pulmonary disease with (acute) exacerbation: Secondary | ICD-10-CM | POA: Diagnosis not present

## 2023-07-22 DIAGNOSIS — J9601 Acute respiratory failure with hypoxia: Secondary | ICD-10-CM | POA: Diagnosis not present

## 2023-07-22 LAB — CBC WITH DIFFERENTIAL/PLATELET
Abs Immature Granulocytes: 0.31 10*3/uL — ABNORMAL HIGH (ref 0.00–0.07)
Basophils Absolute: 0 10*3/uL (ref 0.0–0.1)
Basophils Relative: 0 %
Eosinophils Absolute: 0.2 10*3/uL (ref 0.0–0.5)
Eosinophils Relative: 1 %
HCT: 35.4 % — ABNORMAL LOW (ref 39.0–52.0)
Hemoglobin: 12 g/dL — ABNORMAL LOW (ref 13.0–17.0)
Immature Granulocytes: 2 %
Lymphocytes Relative: 5 %
Lymphs Abs: 0.8 10*3/uL (ref 0.7–4.0)
MCH: 28.3 pg (ref 26.0–34.0)
MCHC: 33.9 g/dL (ref 30.0–36.0)
MCV: 83.5 fL (ref 80.0–100.0)
Monocytes Absolute: 0.5 10*3/uL (ref 0.1–1.0)
Monocytes Relative: 3 %
Neutro Abs: 12.3 10*3/uL — ABNORMAL HIGH (ref 1.7–7.7)
Neutrophils Relative %: 89 %
Platelets: 511 10*3/uL — ABNORMAL HIGH (ref 150–400)
RBC: 4.24 MIL/uL (ref 4.22–5.81)
RDW: 12.8 % (ref 11.5–15.5)
WBC: 14 10*3/uL — ABNORMAL HIGH (ref 4.0–10.5)
nRBC: 0 % (ref 0.0–0.2)

## 2023-07-22 LAB — COMPREHENSIVE METABOLIC PANEL
ALT: 160 U/L — ABNORMAL HIGH (ref 0–44)
AST: 61 U/L — ABNORMAL HIGH (ref 15–41)
Albumin: 2.3 g/dL — ABNORMAL LOW (ref 3.5–5.0)
Alkaline Phosphatase: 181 U/L — ABNORMAL HIGH (ref 38–126)
Anion gap: 11 (ref 5–15)
BUN: 21 mg/dL — ABNORMAL HIGH (ref 6–20)
CO2: 23 mmol/L (ref 22–32)
Calcium: 8.6 mg/dL — ABNORMAL LOW (ref 8.9–10.3)
Chloride: 102 mmol/L (ref 98–111)
Creatinine, Ser: 1.14 mg/dL (ref 0.61–1.24)
GFR, Estimated: >60 mL/min (ref >60)
Glucose, Bld: 137 mg/dL — ABNORMAL HIGH (ref 70–99)
Potassium: 3.4 mmol/L — ABNORMAL LOW (ref 3.5–5.1)
Sodium: 136 mmol/L (ref 135–145)
Total Bilirubin: 0.6 mg/dL (ref 0.0–1.2)
Total Protein: 6.8 g/dL (ref 6.5–8.1)

## 2023-07-22 LAB — GLUCOSE, CAPILLARY
Glucose-Capillary: 134 mg/dL — ABNORMAL HIGH (ref 70–99)
Glucose-Capillary: 161 mg/dL — ABNORMAL HIGH (ref 70–99)
Glucose-Capillary: 230 mg/dL — ABNORMAL HIGH (ref 70–99)
Glucose-Capillary: 165 mg/dL — ABNORMAL HIGH (ref 70–99)

## 2023-07-22 MED ORDER — PIPERACILLIN-TAZOBACTAM 3.375 G IVPB
3.3750 g | Freq: Three times a day (TID) | INTRAVENOUS | Status: DC
Start: 2023-07-22 — End: 2023-07-22
  Administered 2023-07-22: 3.375 g via INTRAVENOUS
  Filled 2023-07-22: qty 50

## 2023-07-22 NOTE — Consult Note (Addendum)
 His sister, Christopher [name], was contacted and she had no safety concerns, no guns in the home. She stated, "No, he wont' harm himself".  Then, went on to say that he had two strokes that affects his brain is confused often.  Christopher [name] lives with his sister and he stated it was going well yesterday.  Psych will sign off at this time since he is cooperating with care and no safety concerns.  Nanine Means, PMHNP

## 2023-07-22 NOTE — Consult Note (Cosign Needed Addendum)
 Palliative Medicine Inpatient Consult Note  Consulting Provider: Dr. Mahala Menghini  Reason for consult:   Palliative Care Consult Services Palliative Medicine Consult  Reason for Consult? eol discussions   07/22/2023  HPI:  Per intake H&P --> This is a 58 year old male w/ hx as outlined below. Admitted from home on 3/6 w/ cc: 3d h/o cough, fever and increasing shortness of breath. Treated for sepsis from flu PNA. Palliative care asked to support additional goals of care conversations.   Clinical Assessment/Goals of Care:  *Please note that this is a verbal dictation therefore any spelling or grammatical errors are due to the "Dragon Medical One" system interpretation.  I have reviewed medical records including EPIC notes, labs and imaging, received report from bedside RN, assessed the patient who is lying in bed in NAD.    I met with Christopher [name] to further discuss diagnosis prognosis, GOC, EOL wishes, disposition and options.   I introduced Palliative Medicine as specialized medical care for people living with serious illness. It focuses on providing relief from the symptoms and stress of a serious illness. The goal is to improve quality of life for both the patient and the family.  Medical History Review and Understanding:  A review of Christopher [name] past medical history of HLD, HTN, two CVA's, PUD, GERD, COPD, tobacco use, and ETOH use.  Social History:  Christopher [name] shares that he is from [place]. He is not married and has no children. He presently lives with his sister, Christopher [name]. 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   Functional and Nutritional State:  Christopher [name] is able to mobilize in his home. He is able to perform basic tasks of daily living. He shares that he does have an active appetite.   Advance Directives:  A detailed discussion was had today regarding advanced directives.    Code Status:  Concepts specific  to code status, artifical feeding and hydration, continued IV antibiotics and rehospitalization was had.  The difference between a aggressive medical intervention path  and a palliative comfort care path for this patient at this time was had.   Christopher [name] is an established DNAR/DNI code status.  Discussion:  Christopher [name] shares that he has been admitted to the hospital in the setting of the flu. He has been here for ten days thus far. Reviewed his hypoxic respiratory failure in the setting of his COPD and PNA. He shares the goal to get home. Discussed patients decreased physical reserve and ongoing precipitous decline.   I was able to explain the differences between hospice and palliative care:  Palliative care is specialized medical care for people living with a serious illness, such as cancer, heart failure, COPD, Alzheimer's dementia, etc. Patients in palliative care may receive medical care for their symptoms, or palliative care, along with treatment intended to cure their serious illness   Hospice care focuses on the care, comfort, and quality of life of a person with a serious illness who is approaching the end of life. At some point, it may not be possible to cure a serious illness, or a patient may choose not to undergo certain treatments. Hospice is designed for this situation.  Plan for Christopher [name] to meet with [facility] for evaluation.   Discussed the importance of continued conversation with family and their  medical providers regarding overall plan of care and treatment options, ensuring decisions are within the context of the patients values and  GOCs. _________________________ Addendum:  I called and spoke to patients sister, Christopher [name] to discuss his current condition. She shares that she knows he wants to come home with her. He was apparently doing very well preceding hospitalization. She and I discussed hospice. She shares that Christopher [name] gets very overwhelmed and moody in the hospital. She feels  once hospice is further discussed he will likely opt against it but agrees to the liaison meeting with him.   Decision Maker: Patient can make decisions for himself   SUMMARY OF RECOMMENDATIONS   DNAR/DNI  Discussed patients chronic disease burden in the setting of acute illness  Reviewed the idea of hospice support at home versus Palliative care  Plan for [facility] to meet with patient   Ongoing PMT support   Code Status/Advance Care Planning: DNAR/DNI   Palliative Prophylaxis:  Aspiration, Bowel Regimen, Delirium Protocol, Frequent Pain Assessment, Oral Care, Palliative Wound Care, and Turn Reposition  Additional Recommendations (Limitations, Scope, Preferences): Continue current care  Psycho-social/Spiritual:  Desire for further Chaplaincy support: Yes Additional Recommendations: Education on advanced disease   Prognosis: High disease burden.   Discharge Planning: Discharge plan to be determined.  Vitals:   07/22/23 1200 07/22/23 1300  BP: 122/65 120/77  Pulse: 81 82  Resp: 19 19  Temp:    SpO2: 96% 91%    Intake/Output Summary (Last 24 hours) at 07/22/2023 1342 Last data filed at 07/22/2023 1200 Gross per 24 hour  Intake 837.8 ml  Output 1050 ml  Net -212.2 ml   Last Weight  [name] recent update: 07/16/2023  8:30 PM    Weight  71.6 kg (157 lb 13.6 oz)            Gen:  Middle aged Caucasian M chronically ill appearing HEENT: Dry mucous membranes CV: Regular rate and rhythm  PULM: On 8LPM [place], breathing is nonlabored ABD: soft/nontender  EXT: No edema  Neuro: Alert and oriented x3 - Flat affects  PPS: 40-50%   This conversation/these recommendations were discussed with patient primary care team, Dr. Mahala Menghini  Billing based on MDM: High ______________________________________________________ [facility] Palliative Medicine Team Team Cell Phone: [phone] Please utilize secure chat with additional questions, if there is no  response within 30 minutes please call the above phone number  Palliative Medicine Team providers are available by phone from 7am to 7pm daily and can be reached through the team cell phone.  Should this patient require assistance outside of these hours, please call the patient's attending physician.

## 2023-07-22 NOTE — Progress Notes (Signed)
 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 3/14 CXR shows persistent PNA   Plan  Acute hypoxic respiratory failure H influenza on admission with sepsis from this SLP evaluated on 3/10 and did not feel needed any change in diet and recommended regular texture thin liquid Received broad-spectrum antibiotics doxycycline ceftriaxone for 7 days ending 3/11 and was then placed on Zosyn 3/12---stop abx today, rpt cxr in am  requiring heated high flow FiO2 85% flow 50---cut back flow to 8 liters and is tolerating  Solu-Medrol switched to prednisone 40-continue inhalers Brovana 15 twice daily, Pulmicort 0.25 twice daily, DuoNeb 3 mL every 6 and Incruse Ellipta and Mucinex as able  Chest pain EF this admission 60-65% grade 1 DD BNP slight elevation earlier in hospital stay 115 Previously  trialed on Lasix -- held because of iatrogenic AKI--giving low dose lasix 40 iv--change to oral depending on cxr EKG my over read showed only sinus rhythm with PVC troponin is pending  Multiple strokes 1 given tPA Continues aspirin 81, Lipitor 20 Risk factors continued tobacco, cocaine-urged to desist  Depression,  noncompliance with therapy Discussed with me on 3/14 and with critical care that "he just wanted to die" he seems depressed and psychiatry as well as palliative care were consulted Palliative assessment pending Psychiatry has placed him on lexapro 40 daily Palliative care to delineate goals of care  Impaired glucose tolerance probably from steroids   electrolyte abnormality with severe hypokalemia on admission Resolved   DVT prophylaxis: Heparin  Status is: Inpatient Remains inpatient appropriate because:   Unable to wean will need some time to get better If he decides to go home with hospice we will need to order equipment   Subjective:  Compliant with meds, therapies No cp No cough cold fever. No chills Eating some   Objective + exam Vitals:   07/22/23 1100 07/22/23 1128 07/22/23 1200 07/22/23 1300  BP: 124/64  122/65 120/77  Pulse: 82  81 82  Resp: 19  19 19   Temp:  98.8 F (37.1 C)    TempSrc:  Axillary    SpO2: 93%  96% 91%  Weight:      Height:       Filed Weights   07/12/23 1507 07/16/23 2030  Weight: 71.7 kg 71.6 kg    Examination:  EOMI NCAT No rales rhonchi or wheeze posteriorly decreased air entry more on R Lower lung fields ROM intact moving 4 limbs equally Power 5/5 Abdomen soft no rebound no guarding No lower extremity edema  Data Reviewed: reviewed   CBC    Component Value Date/Time   WBC 14.0 (H) 07/22/2023 0831   RBC 4.24 07/22/2023 0831  HGB 12.0 (L) 07/22/2023 0831   HGB 13.1 03/19/2023 0000   HCT 35.4 (L) 07/22/2023 0831   HCT 41.0 03/19/2023 0000   PLT 511 (H) 07/22/2023 0831   PLT 326 03/19/2023 0000   MCV 83.5 07/22/2023 0831   MCV 88 03/19/2023 0000   MCH 28.3 07/22/2023 0831   MCHC 33.9 07/22/2023 0831   RDW 12.8 07/22/2023 0831   RDW 11.8 03/19/2023 0000   LYMPHSABS 0.8 07/22/2023 0831   LYMPHSABS 2.0 03/19/2023 0000   MONOABS 0.5 07/22/2023 0831   EOSABS 0.2 07/22/2023 0831   EOSABS 0.2 03/19/2023 0000    BASOSABS 0.0 07/22/2023 0831   BASOSABS 0.1 03/19/2023 0000      Latest Ref Rng & Units 07/22/2023    8:31 AM 07/20/2023    4:10 AM 07/19/2023    4:09 AM  CMP  Glucose 70 - 99 mg/dL 403  474  259   BUN 6 - 20 mg/dL 21  26  30    Creatinine 0.61 - 1.24 mg/dL 5.63  8.75  6.43   Sodium 135 - 145 mmol/L 136  135  134   Potassium 3.5 - 5.1 mmol/L 3.4  3.9  4.3   Chloride 98 - 111 mmol/L 102  102  102   CO2 22 - 32 mmol/L 23  23  24    Calcium 8.9 - 10.3 mg/dL 8.6  8.5  8.4   Total Protein 6.5 - 8.1 g/dL 6.8     Total Bilirubin 0.0 - 1.2 mg/dL 0.6     Alkaline Phos 38 - 126 U/L 181     AST 15 - 41 U/L 61     ALT 0 - 44 U/L 160       Scheduled Meds:  arformoterol  15 mcg Nebulization BID   aspirin EC  81 mg Oral Daily   atorvastatin  20 mg Oral QHS   budesonide (PULMICORT) nebulizer solution  0.25 mg Nebulization BID   Chlorhexidine Gluconate Cloth  6 each Topical Daily   escitalopram  10 mg Oral Daily   folic acid  1 mg Oral Daily   guaiFENesin  1,200 mg Oral BID   heparin  5,000 Units Subcutaneous Q8H   insulin aspart  0-9 Units Subcutaneous TID WC   ipratropium-albuterol  3 mL Nebulization BID   multivitamin with minerals  1 tablet Oral Daily   pantoprazole  40 mg Oral QHS   predniSONE  40 mg Oral QAC breakfast   sodium chloride flush  10-40 mL Intracatheter Q12H   thiamine  100 mg Oral Daily   umeclidinium bromide  1 puff Inhalation Daily   Continuous Infusions:    Time  22  Rhetta Mura, MD  [facility]

## 2023-07-22 NOTE — Progress Notes (Signed)
 NAME:  Christopher [name], MRN:  [id number], DOB:  1965/11/03, LOS: 10 ADMISSION DATE:  07/12/2023, CONSULTATION DATE:  3/10 REFERRING MD:  singh, CHIEF COMPLAINT:  resp failure    History of Present Illness:  This is a 58 year old male w/ hx as outlined below. Admitted from home on 3/6 w/ cc: 3d h/o cough, fever and increasing shortness of breath. No known sick contacts.  ED eval:  Hypoxic required escalation of supplemental oxygen for increased WOB. Was febrile (as high as 102F). Had elevated lactic acid >4. CXR clear, WBC 11.6, RVP positive for influenza A. PCT neg. BNP 115.  Administered IVFs, started on solumedrol, inhaled BDs and  abx. As well as tamiflu.   Pertinent  Medical History  HTN, COPD, tobacco abuse, CVA X 2, ETOH use.   Significant Hospital Events: Including procedures, antibiotic start and stop dates in addition to other pertinent events   3/6 admitted. Steroids started, placed on O2, nebs, tamiflu and doxy 3/7 added CTX x 4 days 3/9 CXR showing RLL airspace disease, SLP also consulted in case this represented aspiration given CVA Hx.  Still on 6 lpm 3/10 Pulm asked to see for worsening resp failure, requiring 12-->15 liters FM.  CXR w/ marked progression of right sided airspace disease.  echo:  EF 60 to 65% normal LV function no regional wall motion abnormality grade 1 diastolic dysfunction antibiotics were widened 3/11 heated high flow. Marked distress. Desating to 70s. Slow to see sats rise. Moved to ICU for better nursing care  3/14 Psych consulted for concern of SI  3/16 Remains on 15L HFNC with ongoing desire to be discharged home   Antibiotics/Antivirals  ceftriax 3/6 > 3/10 Doxy 3/6 > 3/10 Azithro x1 3/6  Vanc 3/10>> 3/11 Cefepime 3/10>> 3/12 Zosyn 3/12 > (3/16) Tamiflu 3/7 >> 3/12   Interim History / Subjective:  Flat affect, states he wants to go home and that he has a headache   Objective   Blood pressure 121/74, pulse 90, temperature 98 F (36.7 C),  temperature source Oral, resp. rate (!) 21, height 5\' 9"  (1.753 m), weight 71.6 kg, SpO2 92%.        Intake/Output Summary (Last 24 hours) at 07/22/2023 4259 Last data filed at 07/22/2023 0608 Gross per 24 hour  Intake 1071.47 ml  Output 2000 ml  Net -928.53 ml   Filed Weights   07/12/23 1507 07/16/23 2030  Weight: 71.7 kg 71.6 kg    Examination: General: Acute on chronic ill-appearing deconditioned thin frail middle-aged male lying in bed in no acute distress HEENT: [place]/AT, MM pink/moist, PERRL,  Neuro: Alert and oriented x 3, flat affect CV: s1s2 regular rate and rhythm, no murmur, rubs, or gallops,  PULM: No increased work of breathing, diminished breath sounds bilaterally, on 15 L nasal cannula GI: soft, bowel sounds active in all 4 quadrants, non-tender, non-distended, tolerating oral diet Extremities: warm/dry, no edema  Skin: no rashes or lesions  Resolved Hospital Problem list   Acute metabolic acidosis  Assessment & Plan:  Acute respiratory failure with hypoxia Acute COPD exacerbation H Influenza pneumonia with sepsis POA  Tobacco dependence -Patient continues to require high-dose supplemental oxygen 15 L high flow in the setting of P: Continue supplemental oxygen, wean as able Continue bronchodilators including Brovana, DuoNebs, Incruse Remains on IV Zosyn stop date 3/16 Low-dose prednisone in place with stop date Mobilize as able Encourage pulmonary hygiene Palliative care and psych following, appreciate assistance   Best Practice (right  click and "Reselect all SmartList Selections" daily)  Per primary   Critical care time: NA  Ellin Fitzgibbons D. Harris, NP-C [facility] Personal contact information can be found on Amion  If no contact or response made please call [phone] 07/22/2023, 7:07 AM

## 2023-07-22 NOTE — Progress Notes (Signed)
   07/22/23 2000  BiPAP/CPAP/SIPAP  BiPAP/CPAP/SIPAP Pt Type Adult  BiPAP/CPAP/SIPAP SERVO  BiPAP/CPAP /SiPAP Vitals  Pulse Rate 68  Resp (!) 92  SpO2 90 %  Bilateral Breath Sounds Clear;Diminished   Pt has PRN BIPAP orders, no distress noted

## 2023-07-22 NOTE — Progress Notes (Signed)
 Christopher [name] [id number]-- [facility]?Hospital Liaison Note:??   ???   Notified by Palliative Provider, request for [facility] services at home after discharge.????????   ????   Hospital liaison will plan to meet with patient on tomorrow to discuss this in further detail.  ????   Thank you for the opportunity to participate in this patient's care.    Roe Rutherford, BSN, RN Hospice Nurse Liaison [phone]

## 2023-07-23 ENCOUNTER — Telehealth: Payer: Self-pay | Admitting: Family Medicine

## 2023-07-23 ENCOUNTER — Inpatient Hospital Stay (HOSPITAL_COMMUNITY)

## 2023-07-23 DIAGNOSIS — J441 Chronic obstructive pulmonary disease with (acute) exacerbation: Secondary | ICD-10-CM | POA: Diagnosis not present

## 2023-07-23 LAB — CBC WITH DIFFERENTIAL/PLATELET
Abs Immature Granulocytes: 0.34 10*3/uL — ABNORMAL HIGH (ref 0.00–0.07)
Basophils Absolute: 0 10*3/uL (ref 0.0–0.1)
Basophils Relative: 0 %
Eosinophils Absolute: 0.2 10*3/uL (ref 0.0–0.5)
Eosinophils Relative: 1 %
HCT: 35.1 % — ABNORMAL LOW (ref 39.0–52.0)
Hemoglobin: 11.5 g/dL — ABNORMAL LOW (ref 13.0–17.0)
Immature Granulocytes: 2 %
Lymphocytes Relative: 8 %
Lymphs Abs: 1.1 10*3/uL (ref 0.7–4.0)
MCH: 28 pg (ref 26.0–34.0)
MCHC: 32.8 g/dL (ref 30.0–36.0)
MCV: 85.4 fL (ref 80.0–100.0)
Monocytes Absolute: 0.4 10*3/uL (ref 0.1–1.0)
Monocytes Relative: 3 %
Neutro Abs: 12.9 10*3/uL — ABNORMAL HIGH (ref 1.7–7.7)
Neutrophils Relative %: 86 %
Platelets: 539 10*3/uL — ABNORMAL HIGH (ref 150–400)
RBC: 4.11 MIL/uL — ABNORMAL LOW (ref 4.22–5.81)
RDW: 12.9 % (ref 11.5–15.5)
WBC: 15 10*3/uL — ABNORMAL HIGH (ref 4.0–10.5)
nRBC: 0 % (ref 0.0–0.2)

## 2023-07-23 LAB — GLUCOSE, CAPILLARY
Glucose-Capillary: 102 mg/dL — ABNORMAL HIGH (ref 70–99)
Glucose-Capillary: 134 mg/dL — ABNORMAL HIGH (ref 70–99)
Glucose-Capillary: 158 mg/dL — ABNORMAL HIGH (ref 70–99)
Glucose-Capillary: 174 mg/dL — ABNORMAL HIGH (ref 70–99)
Glucose-Capillary: 127 mg/dL — ABNORMAL HIGH (ref 70–99)

## 2023-07-23 LAB — BASIC METABOLIC PANEL WITH GFR
Anion gap: 11 (ref 5–15)
BUN: 26 mg/dL — ABNORMAL HIGH (ref 6–20)
CO2: 24 mmol/L (ref 22–32)
Calcium: 8.6 mg/dL — ABNORMAL LOW (ref 8.9–10.3)
Chloride: 102 mmol/L (ref 98–111)
Creatinine, Ser: 1.18 mg/dL (ref 0.61–1.24)
GFR, Estimated: >60 mL/min
Glucose, Bld: 96 mg/dL (ref 70–99)
Potassium: 3.8 mmol/L (ref 3.5–5.1)
Sodium: 137 mmol/L (ref 135–145)

## 2023-07-23 MED ORDER — FUROSEMIDE 10 MG/ML IJ SOLN
40.0000 mg | Freq: Once | INTRAMUSCULAR | Status: AC
  Administered 2023-07-23: 40 mg via INTRAVENOUS
  Filled 2023-07-23: qty 4

## 2023-07-23 NOTE — Plan of Care (Signed)
  Problem: Fluid Volume: Goal: Ability to maintain a balanced intake and output will improve Outcome: Progressing   Problem: Metabolic: Goal: Ability to maintain appropriate glucose levels will improve Outcome: Progressing   Problem: Skin Integrity: Goal: Risk for impaired skin integrity will decrease Outcome: Progressing   Problem: Clinical Measurements: Goal: Will remain free from infection Outcome: Progressing   Problem: Coping: Goal: Level of anxiety will decrease Outcome: Progressing   Problem: Safety: Goal: Ability to remain free from injury will improve Outcome: Progressing

## 2023-07-23 NOTE — TOC Progression Note (Addendum)
 Transition of Care [facility]) - Progression Note    Patient Details  Name: Christopher [name] MRN: [id number] Date of Birth: [birth year]  Transition [facility] Care [facility]) CM/SW Contact  Kermit Balo, RN Phone Number: 07/23/2023, 10:04 AM  Clinical Narrative:     Continues on 12 L HFNC.  Set up with [facility] for [facility] and [facility] for palliative when medically ready. TOC following.  1411: pt and sister have decided on home with hospice with [facility]. They are working on DME for home.   Expected Discharge Plan: Home w Home Health Services    Expected Discharge Plan and Services                                               Social Determinants of Health (SDOH) Interventions SDOH Screenings   Food Insecurity: No Food Insecurity (07/13/2023)  Housing: Low Risk  (07/13/2023)  Transportation Needs: No Transportation Needs (07/13/2023)  Utilities: Not At Risk (07/13/2023)  Alcohol Screen: Medium Risk (08/16/2022)  Depression (PHQ2-9): Low Risk  (06/05/2023)  Financial Resource Strain: Medium Risk (08/16/2022)  Physical Activity: Sufficiently Active (08/16/2022)  Social Connections: Socially Isolated (08/16/2022)  Stress: No Stress Concern Present (02/15/2023)  Tobacco Use: High Risk (07/12/2023)  Health Literacy: Inadequate Health Literacy (02/15/2023)    Readmission Risk Interventions     No data to display

## 2023-07-23 NOTE — Progress Notes (Signed)
 SLP Note  Patient Details Name: Christopher [name] MRN: [id number] DOB: 1966-01-21  Order to reevaluate patient in setting of ongoing concern for bacterial, aspiration related pna. Will need to proceed with instrumental assessment. If pt remains on HHFNC, FEES in room will be most appropriate. Will schedule for tomorrow though will need to check in with pt tomorrow for consent and ability to participate.    Lilybeth Vien, Riley Nearing 07/23/2023, 2:57 PM

## 2023-07-23 NOTE — Progress Notes (Signed)
 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 3/14 CXR shows persistent PNA   Plan  Acute hypoxic respiratory failure H influenza on admission with sepsis also secondary bacterial pneumonia Received doxycycline ceftriaxone for 7 days ending 3/11 and was then placed on Zosyn 3/12- thur 3/16 He has a white count and has crackles on the right side-his x-ray shows possible fluid on the right side which is not significantly changed from prior-I will ask SLP to reeval-SLP cleared him for regular diet on 3/10-they will need to evaluate requiring heated high flow FiO2 85% flow 50---cut back flow to 12 L and is satting 88% Now on prednisone 40-continue inhalers Brovana 15 twice daily, Pulmicort 0.25 twice daily, DuoNeb 3 mL every 6 and Incruse Ellipta and Mucinex as able  Chest pain on 3/15 EKG wnl/Trop neg EF this admission 60-65% grade 1 DD BNP slight elevation earlier in hospital stay 115 Previously  trialed on Lasix -- held because of iatrogenic AKI--repeat dose of Lasix 40 IV today  Multiple strokes 1 given tPA Continues aspirin 81, Lipitor 20 Risk factors continued tobacco, cocaine-urged to  desist  Depression, noncompliance with therapy Discussed with me on 3/14 and with critical care that "he just wanted to die" he seems depressed and psychiatry as well as palliative care were consulted Psychiatry has placed him on lexapro 40 daily--hospice to discuss goals  Impaired glucose tolerance probably from steroids   electrolyte abnormality with severe hypokalemia on admission Resolved   DVT prophylaxis: Heparin  Status is: Inpatient Remains inpatient appropriate because:   Unable to wean will need some time to get better If he decides to go home with hospice we will need to order equipment   Subjective:  Compliant with meds, therapies No cp No cough cold fever. No chills Eating some   Objective + exam Vitals:   07/23/23 0030 07/23/23 0457 07/23/23 0753 07/23/23 0832  BP: 109/71 125/61  (!) 119/55  Pulse: 88 86  (!) 104  Resp: 16 18    Temp: 99 F (37.2 C) 99 F (37.2 C)  98.6 F (37 C)  TempSrc: Oral Oral  Oral  SpO2: 93% (!) 87% (!) 88% (!) 87%  Weight:      Height:       Filed Weights   07/12/23 1507 07/16/23 2030  Weight: 71.7 kg 71.6 kg    Examination:  Coherent awake no distress Looks fair feels comfortable Posterior lung field no wheeze S1-S2 no murmur Power 5/5 bilaterally No lower extremity edema   Data Reviewed: reviewed   CBC    Component Value Date/Time   WBC 15.0 (H) 07/23/2023 0449   RBC 4.11 (L) 07/23/2023 0449  HGB 11.5 (L) 07/23/2023 0449   HGB 13.1 03/19/2023 0000   HCT 35.1 (L) 07/23/2023 0449   HCT 41.0 03/19/2023 0000   PLT 539 (H) 07/23/2023 0449   PLT 326 03/19/2023 0000   MCV 85.4 07/23/2023 0449   MCV 88 03/19/2023 0000   MCH 28.0 07/23/2023 0449   MCHC 32.8 07/23/2023 0449   RDW 12.9 07/23/2023 0449   RDW 11.8 03/19/2023 0000   LYMPHSABS 1.1 07/23/2023 0449   LYMPHSABS 2.0 03/19/2023 0000   MONOABS 0.4 07/23/2023 0449   EOSABS 0.2 07/23/2023 0449   EOSABS 0.2 03/19/2023 0000   BASOSABS 0.0 07/23/2023  0449   BASOSABS 0.1 03/19/2023 0000      Latest Ref Rng & Units 07/23/2023    4:49 AM 07/22/2023    8:31 AM 07/20/2023    4:10 AM  CMP  Glucose 70 - 99 mg/dL 96  213  086   BUN 6 - 20 mg/dL 26  21  26    Creatinine 0.61 - 1.24 mg/dL 5.78  4.69  6.29   Sodium 135 - 145 mmol/L 137  136  135   Potassium 3.5 - 5.1 mmol/L 3.8  3.4  3.9   Chloride 98 - 111 mmol/L 102  102  102   CO2 22 - 32 mmol/L 24  23  23    Calcium 8.9 - 10.3 mg/dL 8.6  8.6  8.5   Total Protein 6.5 - 8.1 g/dL  6.8    Total Bilirubin 0.0 - 1.2 mg/dL  0.6    Alkaline Phos 38 - 126 U/L  181    AST 15 - 41 U/L  61    ALT 0 - 44 U/L  160      Scheduled Meds:  arformoterol  15 mcg Nebulization BID   aspirin EC  81 mg Oral Daily   atorvastatin  20 mg Oral QHS   budesonide (PULMICORT) nebulizer solution  0.25 mg Nebulization BID   Chlorhexidine Gluconate Cloth  6 each Topical Daily   escitalopram  10 mg Oral Daily   folic acid  1 mg Oral Daily   guaiFENesin  1,200 mg Oral BID   heparin  5,000 Units Subcutaneous Q8H   insulin aspart  0-9 Units Subcutaneous TID WC   ipratropium-albuterol  3 mL Nebulization BID   multivitamin with minerals  1 tablet Oral Daily   pantoprazole  40 mg Oral QHS   predniSONE  40 mg Oral QAC breakfast   sodium chloride flush  10-40 mL Intracatheter Q12H   thiamine  100 mg Oral Daily   umeclidinium bromide  1 puff Inhalation Daily   Continuous Infusions:   Time  32  Rhetta Mura, MD  [facility]

## 2023-07-23 NOTE — Progress Notes (Signed)
 Physical Therapy Treatment Patient Details Name: Christopher [name] MRN: [id number] DOB: November 13, 1965 Today's Date: 07/23/2023   History of Present Illness 58 yo male admitted 3/6 due to fever, cough, SOB with flu A + and COPD exacerbation. 3/10 increased O2 requirements with HHFNC. PMHx: HTN, HLD, Lt MCA CVA, hx of cocaine abuse.    PT Comments  Patient resting in bed and agreeable to mobilize with PT, states he may be going home tomorrow. Pt completed bed mobility at supervision level and sit<>stand with CGA for safety. Pt able to amb ~10-20' bouts in room on 15L/min with SpO2 remained 88% or greater and recovering to 91-94% with rest.  Pt on 13L/min HFNC when not ambulating and restin EOB. Functional exercises completed for LE strength and breathing with sit<>stands and flutter valve. EOS pt returned to supine and placed in partial chair position. RN arrived to medicate. Pt will continue to benefit from skilled PT interventions, will progress as able.   If plan is discharge home, recommend the following: Help with stairs or ramp for entrance;Assist for transportation;Assistance with cooking/housework;A little help with walking and/or transfers;A little help with bathing/dressing/bathroom   Can travel by private Theme park manager (measurements PT)    Recommendations for Other Services       Precautions / Restrictions Precautions Precautions: Fall;Other (comment) Recall of Precautions/Restrictions: Intact Precaution/Restrictions Comments: watch sats     Mobility  Bed Mobility Overal bed mobility: Modified Independent             General bed mobility comments: pt transitioned from sitting to supine with assist only for line management    Transfers Overall transfer level: Needs assistance   Transfers: Sit to/from Stand, Bed to chair/wheelchair/BSC Sit to Stand: Supervision   Step pivot transfers: Supervision       General transfer  comment: supervision for line management x 2 from recliner. pt able to rise without UB support and transfer to bed with support    Ambulation/Gait Ambulation/Gait assistance: Contact guard assist Gait Distance (Feet): 20 Feet Assistive device: None, 1 person hand held assist Gait Pattern/deviations: Step-through pattern, Decreased stride length Gait velocity: decreased     General Gait Details: Pt amb shor tbout in room 2x 10' with seated rest between and 1x 20'. Pt on 15L/min portable O2 and transitioned to 13L/min HFNC when seated EOB. Desat to 88% for SpO2 and recovered to 91% with rest. following extended rest recovered to 94%. min assist to steady initially fading to CGA for safety with no UE support.   Stairs             Wheelchair Mobility     Tilt Bed    Modified Rankin (Stroke Patients Only)       Balance Overall balance assessment: Mild deficits observed, not formally tested Sitting-balance support: Single extremity supported Sitting balance-Leahy Scale: Good     Standing balance support: No upper extremity supported, During functional activity Standing balance-Leahy Scale: Fair                              Hotel manager: No apparent difficulties  Cognition Arousal: Alert Behavior During Therapy: WFL for tasks assessed/performed   PT - Cognitive impairments: No apparent impairments                         Following commands: Intact  Cueing Cueing Techniques: Verbal cues  Exercises Other Exercises Other Exercises: 2x5 reps sit<>stand no UE use Other Exercises: 2x5 reps flutter valve    General Comments        Pertinent Vitals/Pain Pain Assessment Pain Assessment: No/denies pain Pain Intervention(s): Monitored during session    Home Living                          Prior Function            PT Goals (current goals can now be found in the care plan section) Acute Rehab PT  Goals PT Goal Formulation: With patient Time For Goal Achievement: 07/29/23 Potential to Achieve Goals: Good Progress towards PT goals: Progressing toward goals    Frequency    Min 2X/week      PT Plan      Co-evaluation              AM-PAC PT "6 Clicks" Mobility   Outcome Measure  Help needed turning from your back to your side while in a flat bed without using bedrails?: None Help needed moving from lying on your back to sitting on the side of a flat bed without using bedrails?: None Help needed moving to and from a bed to a chair (including a wheelchair)?: A Little Help needed standing up from a chair using your arms (e.g., wheelchair or bedside chair)?: A Little Help needed to walk in hospital room?: A Little Help needed climbing 3-5 steps with a railing? : A Lot 6 Click Score: 19    End of Session Equipment Utilized During Treatment: Oxygen Activity Tolerance: Patient tolerated treatment well Patient left: in bed;with call bell/phone within reach;with bed alarm set (chair position) Nurse Communication: Mobility status PT Visit Diagnosis: Other abnormalities of gait and mobility (R26.89)     Time: 7829-5621 PT Time Calculation (min) (ACUTE ONLY): 23 min  Charges:    $Gait Training: 8-22 mins $Therapeutic Activity: 8-22 mins PT General Charges $$ ACUTE PT VISIT: 1 Visit                     Wynn Maudlin, DPT Acute Rehabilitation Services Office [phone]  07/23/23 3:09 PM

## 2023-07-23 NOTE — Progress Notes (Signed)
 [facility] Liaison Note  Received request for hospice services at home after discharge. Spoke with patient in hospital and Christopher [name], sister, by phone to initiate education related to hospice philosophy, services, and team approach to care. Patient/family verbalized understanding of information given. Per discussion, the plan is for discharge home likely tomorrow.   DME needs discussed. Patient has the a shower chair in the home. Patient/family requests the following equipment for delivery: oxygen, currently 13LPM via HFNC, and wheelchair. The address has been verified and is correct in the chart. Christopher [name] is the family contact to arrange time of equipment delivery.  Please send signed and completed DNR home with patient/family. Please provide prescriptions at discharge as needed to ensure ongoing symptom management.   [facility] information and contact numbers given to [facility] and Patient. Above information shared with Harvin Hazel, Transitions of Care Manager. Please call with any questions or concerns.   Thank you for the opportunity to participate in this patient's care.   Glenna Fellows BSN, Charity fundraiser, OCN [employer] [phone]

## 2023-07-23 NOTE — Plan of Care (Signed)
 Encourage IS every 4 hours. Slow deep breaths. Lasix ordered and given per order. Coarse lungs and diminished at bases.     Problem: Coping: Goal: Ability to adjust to condition or change in health will improve Outcome: Progressing   Problem: Fluid Volume: Goal: Ability to maintain a balanced intake and output will improve Outcome: Progressing   Problem: Nutritional: Goal: Maintenance of adequate nutrition will improve Outcome: Progressing   Problem: Skin Integrity: Goal: Risk for impaired skin integrity will decrease Outcome: Progressing   Problem: Activity: Goal: Risk for activity intolerance will decrease Outcome: Progressing

## 2023-07-23 NOTE — Telephone Encounter (Signed)
 Copied from CRM [id number]. Topic: General - Other >> Jul 23, 2023  3:24 PM Turkey B wrote: Christopher [name] from [facility] called in asking if Dr Andrey Campanile will folow pat and if she believes Christopher [name] has 6 months or less to live

## 2023-07-24 ENCOUNTER — Other Ambulatory Visit (HOSPITAL_COMMUNITY): Payer: Self-pay

## 2023-07-24 DIAGNOSIS — J441 Chronic obstructive pulmonary disease with (acute) exacerbation: Secondary | ICD-10-CM | POA: Diagnosis not present

## 2023-07-24 LAB — BASIC METABOLIC PANEL
Anion gap: 9 (ref 5–15)
BUN: 23 mg/dL — ABNORMAL HIGH (ref 6–20)
CO2: 23 mmol/L (ref 22–32)
Calcium: 8.5 mg/dL — ABNORMAL LOW (ref 8.9–10.3)
Chloride: 102 mmol/L (ref 98–111)
Creatinine, Ser: 1.16 mg/dL (ref 0.61–1.24)
GFR, Estimated: >60 mL/min (ref >60)
Glucose, Bld: 98 mg/dL (ref 70–99)
Potassium: 3.7 mmol/L (ref 3.5–5.1)
Sodium: 134 mmol/L — ABNORMAL LOW (ref 135–145)

## 2023-07-24 LAB — CBC WITH DIFFERENTIAL/PLATELET
Abs Immature Granulocytes: 0.4 10*3/uL — ABNORMAL HIGH (ref 0.00–0.07)
Basophils Absolute: 0 10*3/uL (ref 0.0–0.1)
Basophils Relative: 0 %
Eosinophils Absolute: 0.2 10*3/uL (ref 0.0–0.5)
Eosinophils Relative: 1 %
HCT: 34.3 % — ABNORMAL LOW (ref 39.0–52.0)
Hemoglobin: 11.3 g/dL — ABNORMAL LOW (ref 13.0–17.0)
Immature Granulocytes: 3 %
Lymphocytes Relative: 9 %
Lymphs Abs: 1.3 10*3/uL (ref 0.7–4.0)
MCH: 28.1 pg (ref 26.0–34.0)
MCHC: 32.9 g/dL (ref 30.0–36.0)
MCV: 85.3 fL (ref 80.0–100.0)
Monocytes Absolute: 0.5 10*3/uL (ref 0.1–1.0)
Monocytes Relative: 3 %
Neutro Abs: 12.3 10*3/uL — ABNORMAL HIGH (ref 1.7–7.7)
Neutrophils Relative %: 84 %
Platelets: 599 10*3/uL — ABNORMAL HIGH (ref 150–400)
RBC: 4.02 MIL/uL — ABNORMAL LOW (ref 4.22–5.81)
RDW: 12.7 % (ref 11.5–15.5)
WBC: 14.7 10*3/uL — ABNORMAL HIGH (ref 4.0–10.5)
nRBC: 0 % (ref 0.0–0.2)

## 2023-07-24 LAB — GLUCOSE, CAPILLARY
Glucose-Capillary: 108 mg/dL — ABNORMAL HIGH (ref 70–99)
Glucose-Capillary: 108 mg/dL — ABNORMAL HIGH (ref 70–99)
Glucose-Capillary: 113 mg/dL — ABNORMAL HIGH (ref 70–99)

## 2023-07-24 MED ORDER — ARFORMOTEROL TARTRATE 15 MCG/2ML IN NEBU
15.0000 ug | INHALATION_SOLUTION | Freq: Two times a day (BID) | RESPIRATORY_TRACT | 0 refills | Status: DC
  Filled 2023-07-24: qty 120, 30d supply, fill #0

## 2023-07-24 MED ORDER — PREDNISONE 20 MG PO TABS
40.0000 mg | ORAL_TABLET | Freq: Every day | ORAL | 0 refills | Status: DC
  Filled 2023-07-24: qty 2, 1d supply, fill #0

## 2023-07-24 MED ORDER — BUDESONIDE 0.25 MG/2ML IN SUSP
0.2500 mg | Freq: Two times a day (BID) | RESPIRATORY_TRACT | 12 refills | Status: AC
  Filled 2023-07-24: qty 60, 15d supply, fill #0

## 2023-07-24 MED ORDER — GUAIFENESIN ER 600 MG PO TB12
1200.0000 mg | ORAL_TABLET | Freq: Two times a day (BID) | ORAL | 0 refills | Status: DC
  Filled 2023-07-24: qty 20, 5d supply, fill #0

## 2023-07-24 MED ORDER — ESCITALOPRAM OXALATE 10 MG PO TABS
10.0000 mg | ORAL_TABLET | Freq: Every day | ORAL | 2 refills | Status: DC
  Filled 2023-07-24: qty 30, 30d supply, fill #0

## 2023-07-24 MED ORDER — IPRATROPIUM-ALBUTEROL 0.5-2.5 (3) MG/3ML IN SOLN
3.0000 mL | Freq: Two times a day (BID) | RESPIRATORY_TRACT | 0 refills | Status: AC
  Filled 2023-07-24: qty 360, 90d supply, fill #0

## 2023-07-24 MED ORDER — ASPIRIN 81 MG PO TBEC
81.0000 mg | DELAYED_RELEASE_TABLET | Freq: Every day | ORAL | 12 refills | Status: AC
  Filled 2023-07-24: qty 30, 30d supply, fill #0

## 2023-07-24 NOTE — TOC Transition Note (Signed)
 Transition of Care [facility]) - Discharge Note   Patient Details  Name: Christopher [name] MRN: [id number] Date of Birth: 1966-05-06  Transition of Care [facility]) CM/SW Contact:  Kermit Balo, RN Phone Number: 07/24/2023, 3:06 PM   Clinical Narrative:     Pt is discharging home with hospice through [facility]. [facility] to see him in am for admission. Per sister the ordered DME has been delivered to the home.  Sister is at the home and verified the home address. PTAR arranged. Bedside RN and [facility] updated.  D/c packet is at the desk.   Final next level of care: Home w Hospice Care Barriers to Discharge: No Barriers Identified   Patient Goals and CMS Choice   CMS Medicare.gov Compare Post Acute Care list provided to:: Patient Choice offered to / list presented to : Patient      Discharge Placement                       Discharge Plan and Services Additional resources added to the After Visit Summary for                              [facility] Agency: [facility]     Representative spoke with at [facility] Agency: Shawn  Social Drivers of Health (SDOH) Interventions SDOH Screenings   Food Insecurity: No Food Insecurity (07/13/2023)  Housing: Low Risk  (07/13/2023)  Transportation Needs: No Transportation Needs (07/13/2023)  Utilities: Not At Risk (07/13/2023)  Alcohol Screen: Medium Risk (08/16/2022)  Depression (PHQ2-9): Low Risk  (06/05/2023)  Financial Resource Strain: Medium Risk (08/16/2022)  Physical Activity: Sufficiently Active (08/16/2022)  Social Connections: Socially Isolated (08/16/2022)  Stress: No Stress Concern Present (02/15/2023)  Tobacco Use: High Risk (07/12/2023)  Health Literacy: Inadequate Health Literacy (02/15/2023)     Readmission Risk Interventions     No data to display

## 2023-07-24 NOTE — Discharge Summary (Addendum)
 Physician Discharge Summary  DANISH RUFFINS NGE:[id number] DOB: 1966-04-29 DOA: 07/12/2023  PCP: Georganna Skeans, MD  Admit date: 07/12/2023 Discharge date: 07/24/2023  Time spent: 26 minutes  Recommendations for Outpatient Follow-up:  Patient will be going home with hospice following and high flow oxygen 13 L Will recommend de-escalation of care and transition to full comfort if patient declines at home  Discharge Diagnoses:  MAIN problem for hospitalization   Acute hypoxic failure secondary to H. 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 3/14 CXR shows persistent PNA versus fluid     Plan   Acute hypoxic respiratory failure H influenza on admission with sepsis also secondary bacterial pneumonia Received doxycycline ceftriaxone for 7 days ending 3/11 and was then placed on Zosyn 3/12- thur 3/16 WBC 2/2 steroids-exam clinically unchanged at  discharge his x-ray shows possible fluid on the right side which is not significantly changed from prior-was given Lasix had no fevers or chills Speech therapy reevaluated him but patient absolutely refuses doing a fees and did not want any further therapy requiring heated high flow FiO2 85% flow 50---cut back flow to 13 L and is satting 88%--I suspect that if he recovers it will be very slowly Completes limited course prednisone 40-1-2 tabs as an outpatient Continue inhalers as an outpatient as well as mucolytic's   Chest pain on 3/15 EKG wnl/Trop neg EF this admission 60-65% grade 1 DD BNP slight elevation earlier in hospital stay 115 Previously  trialed on Lasix -- held because of iatrogenic AKI--repeat dose of Lasix 40 IV given periodically-no real change in oxygenation See above   Multiple strokes 1 given tPA Continues aspirin 81, Lipitor 20 Risk factors continued tobacco, cocaine-urged to desist   Depression, noncompliance with therapy Discussed with me on 3/14 and with critical care that "he just wanted to die" he seems depressed and psychiatry as well as palliative care were consulted Psychiatry has placed him on lexapro 40 daily--hospice evaluated him and recommended home with comfort philosophy given the likelihood that he would probably decline in the next several weeks DNR was signed it was felt that he had capacity to make decisions and he was treated for comorbid depression as well   Impaired glucose tolerance probably from steroids   electrolyte abnormality with severe hypokalemia on admission Resolved If his goals change to get labs in about a week    Discharge Exam: Vitals:   07/24/23 0823 07/24/23 0840  BP:  126/68  Pulse:  99  Resp:  Temp:  97.9 F (36.6 C)  SpO2: 91% (!) 88%    Subj on day of d/c   Doing fair comfortable does not want to do swallow eval no other complaints-seems happy to go home  General Exam on discharge  NCAT no focal deficit no  icterus no pallor ROM intact Power 5/5 No wheeze rales rhonchi posterolaterally Abdomen soft no rebound no guarding S1-S2 slight tachycardia on monitors No lower extremity edema  Discharge Instructions   Discharge Instructions     Diet - low sodium heart healthy   Complete by: As directed    Increase activity slowly   Complete by: As directed       Allergies as of 07/24/2023   No Known Allergies      Medication List     STOP taking these medications    amLODipine 10 MG tablet Commonly known as: NORVASC   lisinopril 20 MG tablet Commonly known as: ZESTRIL       TAKE these medications    acetaminophen 325 MG tablet Commonly known as: TYLENOL Take 1-2 tablets (325-650 mg total) by mouth every 4 (four) hours as needed for mild pain.   aspirin EC 81 MG tablet Take 1 tablet (81 mg total) by mouth daily. Swallow whole. What changed:  medication strength how much to take additional instructions   atorvastatin 20 MG tablet Commonly known as: LIPITOR Take 1 tablet (20 mg total) by mouth daily.   budesonide 0.25 MG/2ML nebulizer solution Commonly known as: PULMICORT Take 2 mLs (0.25 mg total) by nebulization 2 (two) times daily.   escitalopram 10 MG tablet Commonly known as: LEXAPRO Take 1 tablet (10 mg total) by mouth daily.   guaiFENesin 600 MG 12 hr tablet Commonly known as: MUCINEX Take 2 tablets (1,200 mg total) by mouth 2 (two) times daily.   ipratropium-albuterol 0.5-2.5 (3) MG/3ML Soln Commonly known as: DUONEB Take 3 mLs by nebulization 2 (two) times daily.   omeprazole 20 MG capsule Commonly known as: PRILOSEC Take 1 capsule (20 mg total) by mouth daily.   predniSONE 20 MG tablet Commonly known as: DELTASONE Take 2 tablets (40 mg total) by mouth daily before breakfast. Start taking on: July 25, 2023       No Known Allergies  Follow-up Information     [facility].   Specialty: Home Health Services Why: Someone  will cll you to schedule first home visit. Contact information: [address] [place] [address] [phone]                  The results of significant diagnostics from this hospitalization (including imaging, microbiology, ancillary and laboratory) are listed below for reference.    Significant Diagnostic Studies: DG CHEST PORT 1 VIEW Result Date: 07/23/2023 CLINICAL DATA:  Pneumonia.  Shortness of breath. EXAM: PORTABLE CHEST 1 VIEW COMPARISON:  Radiograph 07/20/2023 FINDINGS: Patchy right lung opacity, with mild improvement in the mid lung. Equivocal worsening on the left midlung opacities, with stable basilar airspace disease. Stable heart size and mediastinal contours. No pneumothorax or large pleural effusion. IMPRESSION: Patchy bilateral lung opacities. Mild improvement in the mid lung, equivocal worsening in the left midlung. Electronically Signed   By: Narda Rutherford M.D.   On: 07/23/2023 10:28   DG CHEST PORT 1 VIEW Result Date: 07/20/2023 CLINICAL DATA:  Pneumonia EXAM: PORTABLE CHEST 1 VIEW COMPARISON:  X-ray 07/17/2023 and older FINDINGS: No pneumothorax or effusion. Normal cardiopericardial silhouette. No edema. There are parenchymal  opacity seen in the right mid to lower lung and left lung base. Changing on the right with some increasing confluence but overall less opacity. Increasing opacity left lung base. Overlapping cardiac leads. Degenerative changes along the spine. IMPRESSION: Persistent opacities along the right mid lower lung which has a slightly different configuration. Overall less opacity but some more confluent areas. Increasing opacity at the left lung base as well. Recommend continued follow-up Electronically Signed   By: Karen Kays M.D.   On: 07/20/2023 11:24   DG Chest Port 1 View Result Date: 07/17/2023 CLINICAL DATA:  Follow-up pneumonia EXAM: PORTABLE CHEST 1 VIEW COMPARISON:  07/16/2023 FINDINGS: Cardiac shadow is within normal limits. Aortic  calcifications are noted. Lungs are well aerated bilaterally. Persistent right-sided infiltrate is noted. No sizable effusion is noted. Mild left basilar atelectasis is seen. IMPRESSION: Stable appearance of the chest when compared with the previous day. Electronically Signed   By: Alcide Clever M.D.   On: 07/17/2023 10:20   ECHOCARDIOGRAM COMPLETE Result Date: 07/16/2023    ECHOCARDIOGRAM REPORT   Patient Name:   Christopher [name] Date of Exam: 07/16/2023 Medical Rec #:  [id number]        Height:       69.0 in Accession #:    [id number]       Weight:       158.1 lb Date of Birth:  [birth year]        BSA:          1.869 m Patient Age:    57 years         BP:           124/59 mmHg Patient Gender: M                HR:           86 bpm. Exam Location:  Inpatient Procedure: 2D Echo, 3D Echo, Cardiac Doppler, Color Doppler and Strain Analysis            (Both Spectral and Color Flow Doppler were utilized during            procedure). Indications:    Dyspnea  History:        Patient has prior history of Echocardiogram examinations, most                 recent 09/10/2021. COPD; Risk Factors:Hypertension, Dyslipidemia                 and Current Smoker.  Sonographer:    Karma Ganja Referring Phys: [phone] PETER E BABCOCK  Sonographer Comments: Global longitudinal strain was attempted. IMPRESSIONS  1. Left ventricular ejection fraction, by estimation, is 60 to 65%. Left ventricular ejection fraction by 3D volume is 65 %. The left ventricle has normal function. The left ventricle has no regional wall motion abnormalities. Left ventricular diastolic  parameters are consistent with Grade I diastolic dysfunction (impaired relaxation). The average left ventricular global longitudinal strain is -15.1 %. The global longitudinal strain is abnormal.  2. Right ventricular systolic function is normal. The right ventricular size is normal. Tricuspid regurgitation signal is inadequate for assessing PA pressure.  3. The mitral valve is normal in  structure. No evidence of mitral valve regurgitation. No evidence of mitral stenosis.  4. The aortic valve is tricuspid. There is mild calcification of the aortic valve. Aortic valve regurgitation is mild. No aortic stenosis is present.  5. The inferior vena cava is normal in size with greater  than 50% respiratory variability, suggesting right atrial pressure of 3 mmHg. FINDINGS  Left Ventricle: Left ventricular ejection fraction, by estimation, is 60 to 65%. Left ventricular ejection fraction by 3D volume is 65 %. The left ventricle has normal function. The left ventricle has no regional wall motion abnormalities. The average left ventricular global longitudinal strain is -15.1 %. Strain was performed and the global longitudinal strain is abnormal. The left ventricular internal cavity size was normal in size. There is no left ventricular hypertrophy. Left ventricular diastolic parameters are consistent with Grade I diastolic dysfunction (impaired relaxation). Right Ventricle: The right ventricular size is normal. No increase in right ventricular wall thickness. Right ventricular systolic function is normal. Tricuspid regurgitation signal is inadequate for assessing PA pressure. Left Atrium: Left atrial size was normal in size. Right Atrium: Right atrial size was normal in size. Pericardium: There is no evidence of pericardial effusion. Mitral Valve: The mitral valve is normal in structure. No evidence of mitral valve regurgitation. No evidence of mitral valve stenosis. Tricuspid Valve: The tricuspid valve is normal in structure. Tricuspid valve regurgitation is trivial. Aortic Valve: The aortic valve is tricuspid. There is mild calcification of the aortic valve. Aortic valve regurgitation is mild. Aortic regurgitation PHT measures 496 msec. No aortic stenosis is present. Aortic valve mean gradient measures 4.0 mmHg. Aortic valve peak gradient measures 8.0 mmHg. Aortic valve area, by VTI measures 2.10 cm. Pulmonic  Valve: The pulmonic valve was normal in structure. Pulmonic valve regurgitation is not visualized. Aorta: The aortic root is normal in size and structure. Venous: The inferior vena cava is normal in size with greater than 50% respiratory variability, suggesting right atrial pressure of 3 mmHg. IAS/Shunts: No atrial level shunt detected by color flow Doppler. Additional Comments: 3D was performed not requiring image post processing on an independent workstation and was normal.  LEFT VENTRICLE PLAX 2D LVIDd:         4.40 cm         Diastology LVIDs:         3.40 cm         LV e' medial:    7.93 cm/s LV PW:         1.10 cm         LV E/e' medial:  8.9 LV IVS:        1.00 cm         LV e' lateral:   10.60 cm/s LVOT diam:     1.90 cm         LV E/e' lateral: 6.7 LV SV:         55 LV SV Index:   29              2D Longitudinal LVOT Area:     2.84 cm        Strain                                2D Strain GLS   -15.1 %                                Avg:                                 3D Volume EF  LV 3D EF:    Left                                             ventricul                                             ar                                             ejection                                             fraction                                             by 3D                                             volume is                                             65 %.                                 3D Volume EF:                                3D EF:        65 %                                LV EDV:       125 ml                                LV ESV:       44 ml                                LV SV:        81 ml RIGHT VENTRICLE          IVC RV Basal diam:  3.40 cm  IVC diam: 1.30 cm LEFT ATRIUM             Index        RIGHT ATRIUM           Index LA diam:        3.70 cm 1.98 cm/m   RA  Area:     13.00 cm LA Vol (A2C):   55.5 ml 29.69 ml/m  RA Volume:   31.70 ml  16.96 ml/m LA Vol  (A4C):   36.0 ml 19.26 ml/m LA Biplane Vol: 48.3 ml 25.84 ml/m  AORTIC VALVE AV Area (Vmax):    2.27 cm AV Area (Vmean):   1.90 cm AV Area (VTI):     2.10 cm AV Vmax:           141.00 cm/s AV Vmean:          97.700 cm/s AV VTI:            0.260 m AV Peak Grad:      8.0 mmHg AV Mean Grad:      4.0 mmHg LVOT Vmax:         113.00 cm/s LVOT Vmean:        65.300 cm/s LVOT VTI:          0.193 m LVOT/AV VTI ratio: 0.74 AI PHT:            496 msec  AORTA Ao Root diam: 3.30 cm MITRAL VALVE MV Area (PHT): 3.99 cm    SHUNTS MV Decel Time: 190 msec    Systemic VTI:  0.19 m MV E velocity: 70.90 cm/s  Systemic Diam: 1.90 cm MV A velocity: 77.80 cm/s MV E/A ratio:  0.91 Dalton McleanMD Electronically signed by Wilfred Lacy Signature Date/Time: 07/16/2023/2:48:58 PM    Final    DG Chest Port 1 View Result Date: 07/16/2023 CLINICAL DATA:  Shortness of breath, COPD and fever. EXAM: PORTABLE CHEST 1 VIEW COMPARISON:  07/15/2023 FINDINGS: Heart size and mediastinal contours are normal. Aortic atherosclerosis. Progressive multifocal opacities within the right upper and right lower lung. Atelectasis noted in the left base. Possible small pleural effusions with blunting of the costophrenic angles. Visualized osseous structures appear intact. IMPRESSION: 1. Progressive multifocal opacities within the right upper and right lower lung compatible with multifocal pneumonia. 2. Possible small pleural effusions. Electronically Signed   By: Signa Kell M.D.   On: 07/16/2023 06:31   DG Chest Port 1 View Result Date: 07/15/2023 CLINICAL DATA:  Shortness of breath. EXAM: PORTABLE CHEST 1 VIEW COMPARISON:  07/12/2023 FINDINGS: Interval development of patchy airspace opacity at the right base suspicious for pneumonia. Given rapid interval appearance, aspiration would be a consideration. Streaky density at the left base is probably atelectatic. Cardiopericardial silhouette is at upper limits of normal for size. No acute bony  abnormality. Telemetry leads overlie the chest. IMPRESSION: New airspace disease at the right base compatible with aspiration and/or pneumonia. Streaky density at the left base suggest atelectasis. Electronically Signed   By: Kennith Center M.D.   On: 07/15/2023 07:57   DG Chest Portable 1 View Result Date: 07/12/2023 CLINICAL DATA:  Shortness of breath, cough, fever. EXAM: PORTABLE CHEST 1 VIEW COMPARISON:  October 21, 2006. FINDINGS: The heart size and mediastinal contours are within normal limits. Both lungs are clear. The visualized skeletal structures are unremarkable. IMPRESSION: No active disease. Electronically Signed   By: Lupita Raider M.D.   On: 07/12/2023 17:49    Microbiology: Recent Results (from the past 240 hours)  Respiratory (~20 pathogens) panel by PCR     Status: Abnormal   Collection Time: 07/16/23  1:09 PM   Specimen: Nasopharyngeal Swab; Respiratory  Result Value Ref Range Status   Adenovirus NOT DETECTED NOT DETECTED Final   Coronavirus 229E NOT DETECTED NOT DETECTED Final  Comment: (NOTE) The Coronavirus on the Respiratory Panel, DOES NOT test for the novel  Coronavirus (2019 nCoV)    Coronavirus HKU1 NOT DETECTED NOT DETECTED Final   Coronavirus NL63 NOT DETECTED NOT DETECTED Final   Coronavirus OC43 NOT DETECTED NOT DETECTED Final   Metapneumovirus NOT DETECTED NOT DETECTED Final   Rhinovirus / Enterovirus NOT DETECTED NOT DETECTED Final   Influenza A H1 2009 DETECTED (A) NOT DETECTED Final   Influenza B NOT DETECTED NOT DETECTED Final   Parainfluenza Virus 1 NOT DETECTED NOT DETECTED Final   Parainfluenza Virus 2 NOT DETECTED NOT DETECTED Final   Parainfluenza Virus 3 NOT DETECTED NOT DETECTED Final   Parainfluenza Virus 4 NOT DETECTED NOT DETECTED Final   Respiratory Syncytial Virus NOT DETECTED NOT DETECTED Final   Bordetella pertussis NOT DETECTED NOT DETECTED Final   Bordetella Parapertussis NOT DETECTED NOT DETECTED Final   Chlamydophila pneumoniae NOT  DETECTED NOT DETECTED Final   Mycoplasma pneumoniae NOT DETECTED NOT DETECTED Final    Comment: Performed at [facility], [address] [place], [place] [address]  MRSA Next Gen by PCR, Nasal     Status: None   Collection Time: 07/16/23  1:23 PM   Specimen: Nasal Mucosa; Nasal Swab  Result Value Ref Range Status   MRSA by PCR Next Gen NOT DETECTED NOT DETECTED Final    Comment: (NOTE) The GeneXpert MRSA Assay (FDA approved for NASAL specimens only), is one component of a comprehensive MRSA colonization surveillance program. It is not intended to diagnose MRSA infection nor to guide or monitor treatment for MRSA infections. Test performance is not FDA approved in patients less than 32 years old. Performed at [facility], [address] [place], [place] [address]      Labs: Basic Metabolic Panel: Recent Labs  Lab 07/18/23 0323 07/19/23 0409 07/20/23 0410 07/22/23 0831 07/23/23 0449 07/24/23 0455  NA 133* 134* 135 136 137 134*  K 4.3 4.3 3.9 3.4* 3.8 3.7  CL 100 102 102 102 102 102  CO2 21* 24 23 23 24 23   GLUCOSE 143* 148* 111* 137* 96 98  BUN 29* 30* 26* 21* 26* 23*  CREATININE 1.44* 1.38* 1.30* 1.14 1.18 1.16  CALCIUM 8.4* 8.4* 8.5* 8.6* 8.6* 8.5*  MG 2.1 2.3  --   --   --   --    Liver Function Tests: Recent Labs  Lab 07/22/23 0831  AST 61*  ALT 160*  ALKPHOS 181*  BILITOT 0.6  PROT 6.8  ALBUMIN 2.3*   No results for input(s): "LIPASE", "AMYLASE" in the last 168 hours. No results for input(s): "AMMONIA" in the last 168 hours. CBC: Recent Labs  Lab 07/19/23 0409 07/20/23 0410 07/22/23 0831 07/23/23 0449 07/24/23 0455  WBC 9.3 8.2 14.0* 15.0* 14.7*  NEUTROABS 8.0* 6.5 12.3* 12.9* 12.3*  HGB 11.3* 11.9* 12.0* 11.5* 11.3*  HCT 33.2* 35.1* 35.4* 35.1* 34.3*  MCV 82.8 83.6 83.5 85.4 85.3  PLT 273 379 511* 539* 599*   Cardiac Enzymes: No results for input(s): "CKTOTAL", "CKMB", "CKMBINDEX", "TROPONINI" in the last 168 hours. BNP: BNP  (last 3 results) Recent Labs    07/17/23 0438 07/18/23 0323 07/19/23 0409  BNP 35.9 19.2 12.3    ProBNP (last 3 results) No results for input(s): "PROBNP" in the last 8760 hours.  CBG: Recent Labs  Lab 07/23/23 1121 07/23/23 1652 07/23/23 1811 07/23/23 2132 07/24/23 0624  GLUCAP 134* 158* 174* 127* 108*    Signed:  Rhetta Mura MD   [facility] 07/24/2023, 9:20 AM

## 2023-07-24 NOTE — Progress Notes (Signed)
 Discharged to home with [facility].  Report was called to sister by [facility] nurse.  All belongings checked and sent with.  No resp distress.  O2 on to keep sats above 90.

## 2023-07-24 NOTE — Plan of Care (Signed)

## 2023-07-24 NOTE — Telephone Encounter (Signed)
 I returned call to Sherri at [facility] and made her aware that MD Andrey Campanile  stated "I do not usually follow patients in hospice "

## 2023-07-24 NOTE — Plan of Care (Signed)
   Problem: Education: Goal: Ability to describe self-care measures that may prevent or decrease complications (Diabetes Survival Skills Education) will improve Outcome: Adequate for Discharge Goal: Individualized Educational Video(s) Outcome: Adequate for Discharge   Problem: Coping: Goal: Ability to adjust to condition or change in health will improve Outcome: Adequate for Discharge   Problem: Fluid Volume: Goal: Ability to maintain a balanced intake and output will improve Outcome: Adequate for Discharge   Problem: Health Behavior/Discharge Planning: Goal: Ability to identify and utilize available resources and services will improve Outcome: Adequate for Discharge Goal: Ability to manage health-related needs will improve Outcome: Adequate for Discharge   Problem: Metabolic: Goal: Ability to maintain appropriate glucose levels will improve Outcome: Adequate for Discharge   Problem: Nutritional: Goal: Maintenance of adequate nutrition will improve Outcome: Adequate for Discharge Goal: Progress toward achieving an optimal weight will improve Outcome: Adequate for Discharge   Problem: Skin Integrity: Goal: Risk for impaired skin integrity will decrease Outcome: Adequate for Discharge   Problem: Tissue Perfusion: Goal: Adequacy of tissue perfusion will improve Outcome: Adequate for Discharge   Problem: Education: Goal: Knowledge of General Education information will improve Description: Including pain rating scale, medication(s)/side effects and non-pharmacologic comfort measures Outcome: Adequate for Discharge   Problem: Health Behavior/Discharge Planning: Goal: Ability to manage health-related needs will improve Outcome: Adequate for Discharge   Problem: Clinical Measurements: Goal: Ability to maintain clinical measurements within normal limits will improve Outcome: Adequate for Discharge Goal: Will remain free from infection Outcome: Adequate for Discharge Goal:  Diagnostic test results will improve Outcome: Adequate for Discharge Goal: Respiratory complications will improve Outcome: Adequate for Discharge Goal: Cardiovascular complication will be avoided Outcome: Adequate for Discharge   Problem: Activity: Goal: Risk for activity intolerance will decrease Outcome: Adequate for Discharge   Problem: Nutrition: Goal: Adequate nutrition will be maintained Outcome: Adequate for Discharge   Problem: Coping: Goal: Level of anxiety will decrease Outcome: Adequate for Discharge   Problem: Elimination: Goal: Will not experience complications related to bowel motility Outcome: Adequate for Discharge Goal: Will not experience complications related to urinary retention Outcome: Adequate for Discharge   Problem: Pain Managment: Goal: General experience of comfort will improve and/or be controlled Outcome: Adequate for Discharge   Problem: Safety: Goal: Ability to remain free from injury will improve Outcome: Adequate for Discharge   Problem: Skin Integrity: Goal: Risk for impaired skin integrity will decrease Outcome: Adequate for Discharge   Problem: Education: Goal: Knowledge of disease or condition will improve Outcome: Adequate for Discharge Goal: Knowledge of the prescribed therapeutic regimen will improve Outcome: Adequate for Discharge Goal: Individualized Educational Video(s) Outcome: Adequate for Discharge   Problem: Activity: Goal: Ability to tolerate increased activity will improve Outcome: Adequate for Discharge Goal: Will verbalize the importance of balancing activity with adequate rest periods Outcome: Adequate for Discharge   Problem: Respiratory: Goal: Ability to maintain a clear airway will improve Outcome: Adequate for Discharge Goal: Levels of oxygenation will improve Outcome: Adequate for Discharge Goal: Ability to maintain adequate ventilation will improve Outcome: Adequate for Discharge

## 2023-07-24 NOTE — Progress Notes (Signed)
 Speech Language Pathology Clinical Swallow Evaluation: Dysphagia  Patient Details Name: Christopher [name] MRN: [id number] DOB: 03-May-1966 Today's Date: 07/24/2023 Time: 6295-2841 SLP Time Calculation (min) (ACUTE ONLY): 25 min  Assessment / Plan / Recommendation Clinical Impression  Pt was seen to get consent for a FEES assessment and to provide aspiration precaution training. SLP educated pt about FEES, with pt refusing the procedure. Pt was observed with thin-liquids to determine compensatory strategies; pt exhibited a single event of immediate coughing. Pt notes not having concerns about his swallowing. SLP educated pt about aspiration precautions to optimize airway safety in the absence of further instrumental testing. Pt verbalized understanding and demonstrated use of a slow rate, multiple swallows, and a hard cough after taking a sip. Min verbal and visual cues were beneficial for pt.   Recommend d/c from SLP services due to no additional needs at this time. Recommend pt to use aspiration precaution strategies listed above and oral care BID to reduce bacterial load and to maximize swallow efficiency to support pt's wellbeing.    HPI HPI: Valery Chance is a 58 year old who presented with fever cough shortness of breath. CXR 3/10: "Progressive multifocal opacities within the right upper and right lower lung compatible with multifocal pneumonia."  Found to be positive for influenza. 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      SLP Plan  Discharge SLP treatment due to (comment) (Pt does not need further SLP services.)      Recommendations for follow up therapy are one component of a multi-disciplinary discharge planning process, led by the attending physician.  Recommendations may be updated based on patient status, additional functional criteria and insurance authorization.    Recommendations   Supervision: Patient able to self feed Compensations: Slow rate;Small sips/bites;Multiple dry swallows after each bite/sip;Clear throat intermittently;Hard cough after swallow Postural Changes and/or Swallow Maneuvers: Seated upright 90 degrees                  Oral care BID           Discharge SLP treatment due to (comment) (Pt does not need further SLP services.)     Rowe Robert  07/24/2023, 9:46 AM

## 2023-07-25 ENCOUNTER — Telehealth: Payer: Self-pay

## 2023-07-25 NOTE — Telephone Encounter (Signed)
 Was called per Traundra's note in patients chart.

## 2023-07-25 NOTE — Transitions of Care (Post Inpatient/ED Visit) (Signed)
   07/25/2023  Name: Christopher [name] MRN: [id number] DOB: 10-02-1965  Today's TOC FU Call Status: Today's TOC FU Call Status:: Successful TOC FU Call Completed TOC FU Call Complete Date: 07/25/23 Patient's Name and Date of Birth confirmed.  Transition Care Management Follow-up Telephone Call Date of Discharge: 07/24/23 Discharge Facility: [facility]) Type of Discharge: Inpatient Admission Primary Inpatient Discharge Diagnosis:: COPD exacerbation, Acute respiratory failure with hypoxia - verified patient being admitted to [facility] today at his home on his 58th birthday [birth year]. How have you been since you were released from the hospital?: Better Any questions or concerns?: No (patient and family pleased with [facility]'s prompt follow up today to admit him to their care)   RNCM - Patient expressed relief to be home on his birthday, [birth year] and pleased that he does not have to go back to the hospital. Both he and his sister, [name], who was also on speaker phone, voiced feeling very supported in patient's decision involving his end of life goals of care.  Makaela Cando A. Mliss Fritz RN, BA, [facility], CRRN [facility] RN Care Manager, Transition of Care [phone]

## 2023-08-07 ENCOUNTER — Encounter: Payer: Self-pay | Admitting: Neurology

## 2023-08-07 ENCOUNTER — Ambulatory Visit: Payer: Medicaid Other | Admitting: Neurology

## 2023-09-03 ENCOUNTER — Telehealth: Payer: Self-pay | Admitting: Neurology

## 2023-09-03 NOTE — Telephone Encounter (Signed)
 Pt's sister reschedule cancelled appt.

## 2023-10-03 ENCOUNTER — Encounter: Payer: Self-pay | Admitting: Family Medicine

## 2023-10-03 ENCOUNTER — Ambulatory Visit (INDEPENDENT_AMBULATORY_CARE_PROVIDER_SITE_OTHER): Payer: Medicaid Other | Admitting: Family Medicine

## 2023-10-03 VITALS — BP 157/82 | HR 106 | Wt 165.6 lb

## 2023-10-03 DIAGNOSIS — E785 Hyperlipidemia, unspecified: Secondary | ICD-10-CM

## 2023-10-03 DIAGNOSIS — K649 Unspecified hemorrhoids: Secondary | ICD-10-CM

## 2023-10-03 DIAGNOSIS — I10 Essential (primary) hypertension: Secondary | ICD-10-CM

## 2023-10-03 MED ORDER — LISINOPRIL 20 MG PO TABS: 20.0000 mg | ORAL_TABLET | Freq: Every day | ORAL | 3 refills | Status: DC

## 2023-10-03 NOTE — Progress Notes (Unsigned)
 Established Patient Office Visit  Subjective    Patient ID: Christopher [name], male    DOB: 03-01-1966  Age: 58 y.o. MRN: [id number]  CC:  Chief Complaint  Patient presents with   Medical Management of Chronic Issues    HPI Christopher [name] presents for routine follow up of hypertension. Patient denies acute complaints or concerns.   Outpatient Encounter Medications as of 10/03/2023  Medication Sig   acetaminophen  (TYLENOL ) 325 MG tablet Take 1-2 tablets (325-650 mg total) by mouth every 4 (four) hours as needed for mild pain.   aspirin  EC 81 MG tablet Take 1 tablet (81 mg total) by mouth daily. Swallow whole.   atorvastatin  (LIPITOR) 20 MG tablet Take 1 tablet (20 mg total) by mouth daily.   budesonide  (PULMICORT ) 0.25 MG/2ML nebulizer solution Take 2 mLs (0.25 mg total) by nebulization 2 (two) times daily.   escitalopram  (LEXAPRO ) 10 MG tablet Take 1 tablet (10 mg total) by mouth daily.   guaiFENesin  (MUCINEX ) 600 MG 12 hr tablet Take 2 tablets (1,200 mg total) by mouth 2 (two) times daily.   ipratropium-albuterol  (DUONEB) 0.5-2.5 (3) MG/3ML SOLN Take 3 mLs by nebulization 2 (two) times daily.   lisinopril  (ZESTRIL ) 20 MG tablet Take 1 tablet (20 mg total) by mouth daily.   omeprazole  (PRILOSEC) 20 MG capsule Take 1 capsule (20 mg total) by mouth daily.   predniSONE  (DELTASONE ) 20 MG tablet Take 2 tablets (40 mg total) by mouth daily before breakfast.   No facility-administered encounter medications on file as of 10/03/2023.    Past Medical History:  Diagnosis Date   GERD (gastroesophageal reflux disease)    Hyperlipidemia    Hypertension    PUD (peptic ulcer disease) 2008   treated with meds   Stroke (HCC) 01/2021   Stroke (HCC) 09/2021    Past Surgical History:  Procedure Laterality Date   CLAVICLE SURGERY Right    fracture repair   INGUINAL HERNIA REPAIR Right    age 78   rupture ulcer      Family History  Problem Relation Age of Onset   Cancer Mother     Stroke Father    Colon cancer Neg Hx    Stomach cancer Neg Hx    Esophageal cancer Neg Hx     Social History   Socioeconomic History   Marital status: Divorced    Spouse name: Not on file   Number of children: 0   Years of education: Not on file   Highest education level: Not on file  Occupational History   Occupation: disabled  Tobacco Use   Smoking status: Every Day    Current packs/day: 0.50    Types: Cigarettes   Smokeless tobacco: Former  Building services engineer status: Never Used  Substance and Sexual Activity   Alcohol use: Yes    Comment: 2-3 "sparks" a day   Drug use: Not Currently    Types: "Crack" cocaine    Comment: "only every once in awhile"   Sexual activity: Not Currently  Other Topics Concern   Not on file  Social History Narrative   Not on file   Social Drivers of Health   Financial Resource Strain: Medium Risk (08/16/2022)   Overall Financial Resource Strain (CARDIA)    Difficulty of Paying Living Expenses: Somewhat hard  Food Insecurity: No Food Insecurity (07/13/2023)   Hunger Vital Sign    Worried About Running Out of Food in the Last Year: Never true  Ran Out of Food in the Last Year: Never true  Transportation Needs: No Transportation Needs (07/13/2023)   PRAPARE - Administrator, Civil Service (Medical): No    Lack of Transportation (Non-Medical): No  Physical Activity: Sufficiently Active (08/16/2022)   Exercise Vital Sign    Days of Exercise per Week: 7 days    Minutes of Exercise per Session: 30 min  Stress: No Stress Concern Present (02/15/2023)   [employer] of Occupational Health - Occupational Stress Questionnaire    Feeling of Stress : Not at all  Social Connections: Socially Isolated (08/16/2022)   Social Connection and Isolation Panel [NHANES]    Frequency of Communication with Friends and Family: More than three times a week    Frequency of Social Gatherings with Friends and Family: More than three times a week     Attends Religious Services: Never    Database administrator or Organizations: No    Attends Banker Meetings: Never    Marital Status: Separated  Intimate Partner Violence: Not At Risk (07/13/2023)   Humiliation, Afraid, Rape, and Kick questionnaire    Fear of Current or Ex-Partner: No    Emotionally Abused: No    Physically Abused: No    Sexually Abused: No    Review of Systems  All other systems reviewed and are negative.       Objective    BP (!) 157/82 (BP Location: Right Arm, Patient Position: Sitting, Cuff Size: Normal)   Pulse (!) 106   Wt 165 lb 9.6 oz (75.1 kg)   SpO2 92%   BMI 24.45 kg/m   Physical Exam Vitals and nursing note reviewed.  Constitutional:      General: He is not in acute distress. Cardiovascular:     Rate and Rhythm: Normal rate and regular rhythm.  Pulmonary:     Effort: Pulmonary effort is normal.     Breath sounds: Normal breath sounds.  Abdominal:     Palpations: Abdomen is soft.     Tenderness: There is no abdominal tenderness.  Neurological:     General: No focal deficit present.     Mental Status: He is alert and oriented to person, place, and time.         Assessment & Plan:   Essential hypertension  Dyslipidemia  Other orders -     Lisinopril ; Take 1 tablet (20 mg total) by mouth daily.  Dispense: 90 tablet; Refill: 3     Return in about 3 months (around 01/03/2024) for follow up, chronic med issues.   Christopher Lama, MD

## 2023-10-10 ENCOUNTER — Ambulatory Visit (INDEPENDENT_AMBULATORY_CARE_PROVIDER_SITE_OTHER): Admitting: Nurse Practitioner

## 2023-10-10 ENCOUNTER — Encounter: Payer: Self-pay | Admitting: Nurse Practitioner

## 2023-10-10 ENCOUNTER — Encounter: Admitting: Gastroenterology

## 2023-10-10 VITALS — BP 160/80 | HR 121 | Ht 69.0 in | Wt 167.0 lb

## 2023-10-10 DIAGNOSIS — Z8719 Personal history of other diseases of the digestive system: Secondary | ICD-10-CM | POA: Diagnosis not present

## 2023-10-10 DIAGNOSIS — Z860100 Personal history of colon polyps, unspecified: Secondary | ICD-10-CM | POA: Diagnosis not present

## 2023-10-10 DIAGNOSIS — K648 Other hemorrhoids: Secondary | ICD-10-CM | POA: Diagnosis not present

## 2023-10-10 DIAGNOSIS — K625 Hemorrhage of anus and rectum: Secondary | ICD-10-CM | POA: Diagnosis not present

## 2023-10-10 DIAGNOSIS — D649 Anemia, unspecified: Secondary | ICD-10-CM

## 2023-10-10 MED ORDER — HYDROCORTISONE ACETATE 25 MG RE SUPP: 25.0000 mg | Freq: Every day | RECTAL | 0 refills | Status: AC

## 2023-10-10 NOTE — Progress Notes (Signed)
 10/10/2023 Christopher [name] [id number] [birth year]   CHIEF COMPLAINT: Rectal bleeding   HISTORY OF PRESENT ILLNESS: Christopher [name] is a 58 year old male with a past medical history of hypertension, hyperlipidemia, CVA 2022 and 2023, chronic tobacco use, alcohol overuse, PUD and colon polyps.  He presents today for further evaluation regarding rectal bleeding which occurs every time he passes a bowel movement.  He describes passing a small to moderate amount of bright red blood seen in the toilet water and on the toilet tissue with each bowel movement every other day.  No associated anorectal pain.  No abdominal pain.  He takes ASA 81 mg daily.  Not on anticoagulation.  He underwent a colonoscopy 05/31/2023 which identified 3 polyps removed from the colon, moderate diverticulosis in the sigmoid, descending, transverse and ascending colon and prolapsed nonbleeding internal hemorrhoids.  It was thought the source of his hematochezia was secondary to hemorrhoids and future hemorrhoid banding was recommended if his rectal bleeding persisted.  He wishes to pursue hemorrhoid banding at this time.  History of a perforated stomach ulcer in 2008.  No GERD symptom. On Omeprazole  20 mg daily.  He was admitted to the hospital with acute hypoxic respiratory failure secondary to influenza and suspected bacterial pneumonia.  He stated his respiratory status is back to baseline.  He is a chronic smoker x 30 to 40 years, he is trying to cut back and smokes 1/2 pack of cigarettes daily.  He drinks 216 ounce beers daily and noted drinking a margarita prior to his appointment today.  Past cocaine use, abstinent x 2 years.      Latest Ref Rng & Units 07/24/2023    4:55 AM 07/23/2023    4:49 AM 07/22/2023    8:31 AM  CBC  WBC 4.0 - 10.5 K/uL 14.7  15.0  14.0   Hemoglobin 13.0 - 17.0 g/dL 40.9  81.1  91.4   Hematocrit 39.0 - 52.0 % 34.3  35.1  35.4   Platelets 150 - 400 K/uL 599  539  511         Latest Ref  Rng & Units 07/24/2023    4:55 AM 07/23/2023    4:49 AM 07/22/2023    8:31 AM  CMP  Glucose 70 - 99 mg/dL 98  96  782   BUN 6 - 20 mg/dL 23  26  21    Creatinine 0.61 - 1.24 mg/dL 9.56  2.13  0.86   Sodium 135 - 145 mmol/L 134  137  136   Potassium 3.5 - 5.1 mmol/L 3.7  3.8  3.4   Chloride 98 - 111 mmol/L 102  102  102   CO2 22 - 32 mmol/L 23  24  23    Calcium  8.9 - 10.3 mg/dL 8.5  8.6  8.6   Total Protein 6.5 - 8.1 g/dL   6.8   Total Bilirubin 0.0 - 1.2 mg/dL   0.6   Alkaline Phos 38 - 126 U/L   181   AST 15 - 41 U/L   61   ALT 0 - 44 U/L   160     ECHO 05/31/2023: IMPRESSIONS Left ventricular ejection fraction, by estimation, is 60 to 65%. Left ventricular ejection fraction by 3D volume is 65 %. The left ventricle has normal function. The left ventricle has no regional wall motion abnormalities. Left ventricular diastolic parameters are consistent with Grade I diastolic dysfunction (impaired relaxation). The average left ventricular global longitudinal strain  is -15.1 %. The global longitudinal strain is abnormal. 1. Right ventricular systolic function is normal. The right ventricular size is normal. Tricuspid regurgitation signal is inadequate for assessing PA pressure. 2. The mitral valve is normal in structure. No evidence of mitral valve regurgitation. No evidence of mitral stenosis. 3. The aortic valve is tricuspid. There is mild calcification of the aortic valve. Aortic valve regurgitation is mild. No aortic stenosis is present. 4. The inferior vena cava is normal in size with greater than 50% respiratory variability, suggesting right atrial pressure of 3 mmHg.  PAST GI PROCEDURES:  Colonoscopy 05/31/2023: - Prolapsed hemorrhoids found on perianal exam.  - Two 2 to 4 mm polyps in the ascending colon, removed with a cold snare. Resected and retrieved.  - One 8 mm polyp in the distal transverse colon, removed with a cold snare. Resected and retrieved.  - Moderate diverticulosis in the  sigmoid colon, in the descending colon, in the transverse colon and in the ascending colon.  - Non-bleeding internal hemorrhoids. This is the source of the patient's hematochezia.  Past Medical History:  Diagnosis Date   GERD (gastroesophageal reflux disease)    Hyperlipidemia    Hypertension    PUD (peptic ulcer disease) 2008   treated with meds   Stroke (HCC) 01/2021   Stroke (HCC) 09/2021   Past Surgical History:  Procedure Laterality Date   CLAVICLE SURGERY Right    fracture repair   INGUINAL HERNIA REPAIR Right    age 50   rupture ulcer     No Known Allergies   Outpatient Encounter Medications as of 10/10/2023  Medication Sig   acetaminophen  (TYLENOL ) 325 MG tablet Take 1-2 tablets (325-650 mg total) by mouth every 4 (four) hours as needed for mild pain.   aspirin  EC 81 MG tablet Take 1 tablet (81 mg total) by mouth daily. Swallow whole.   atorvastatin  (LIPITOR) 20 MG tablet Take 1 tablet (20 mg total) by mouth daily.   budesonide  (PULMICORT ) 0.25 MG/2ML nebulizer solution Take 2 mLs (0.25 mg total) by nebulization 2 (two) times daily.   escitalopram  (LEXAPRO ) 10 MG tablet Take 1 tablet (10 mg total) by mouth daily.   guaiFENesin  (MUCINEX ) 600 MG 12 hr tablet Take 2 tablets (1,200 mg total) by mouth 2 (two) times daily.   ipratropium-albuterol  (DUONEB) 0.5-2.5 (3) MG/3ML SOLN Take 3 mLs by nebulization 2 (two) times daily.   lisinopril  (ZESTRIL ) 20 MG tablet Take 1 tablet (20 mg total) by mouth daily.   omeprazole  (PRILOSEC) 20 MG capsule Take 1 capsule (20 mg total) by mouth daily.   predniSONE  (DELTASONE ) 20 MG tablet Take 2 tablets (40 mg total) by mouth daily before breakfast.   No facility-administered encounter medications on file as of 10/10/2023.   REVIEW OF SYSTEMS:  Gen: Denies fever, sweats or chills. No weight loss.  CV: Denies chest pain, palpitations or edema. Resp: Denies cough, shortness of breath of hemoptysis.  GI: See HPI. GU: Denies urinary burning,  blood in urine, increased urinary frequency or incontinence. MS: Denies joint pain, muscles aches or weakness. Derm: Denies rash, itchiness, skin lesions or unhealing ulcers. Psych: Denies depression, anxiety, memory loss or confusion. Heme: + Easy bruising.  Neuro:  Past CVA x 2. Endo:  Denies any problems with DM, thyroid or adrenal function.  PHYSICAL EXAM: BP (!) 160/80   Pulse (!) 121   Ht 5\' 9"  (1.753 m)   Wt 167 lb (75.8 kg)   BMI 24.66 kg/m  Wt  Readings from Last 3 Encounters:  10/10/23 167 lb (75.8 kg)  10/03/23 165 lb 9.6 oz (75.1 kg)  07/16/23 157 lb 13.6 oz (71.6 kg)    General: Disheveled 58 year old male with mild speech impediment no acute distress. Head: Normocephalic and atraumatic. Eyes:  Sclerae non-icteric, conjunctive pink. Ears: Normal auditory acuity. Mouth: Poor dentition.  No ulcers or lesions.  Neck: Supple, no lymphadenopathy or thyromegaly.  Lungs: Clear bilaterally to auscultation without wheezes, crackles or rhonchi. Heart: Regular rate and rhythm. No murmur, rub or gallop appreciated.  Abdomen: Soft, nontender, nondistended. No masses. No hepatosplenomegaly. Normoactive bowel sounds x 4 quadrants.  Rectal: No external hemorrhoids or fissures.  Internal hemorrhoids palpated without prolapse.  No stool, blood or palpable mass within reach. Loysie CMA present during exam.  Musculoskeletal: Symmetrical with no gross deformities. Skin: Warm and dry. No rash or lesions on visible extremities. Extremities: No edema. Neurological: Alert oriented x 4, no focal deficits.  Psychological: Alert and cooperative. Normal mood and affect.  ASSESSMENT AND PLAN:  58 year old male with internal hemorrhoids with rectal bleeding.  Colonoscopy 05/2023 showed prolapsed nonbleeding internal hemorrhoids - CBC - Anusol HC 25 mg suppository 1 PR nightly x 5 nights - Apply a small amount of Desitin inside the anal opening and to the external anal area three times daily as  needed for anal or hemorrhoidal irritation/bleeding.  Patient instructed not to use Desitin the night before or day of hemorrhoid banding appointment. -Schedule internal hemorrhoid banding appoint with Dr. Cherryl Corona - Patient to contact office if rectal bleeding worsens  Colon polyps.  Colonoscopy 05/31/2023 identified 3 polyps removed from the colon, one polyp measured 8mm and two polyps measured 2mm to 4 mm. - Next colon polyp surveillance colonoscopy due 05/2028  History of a perforated stomach ulcer in 2008.  No GERD symptom.  - Continue Omeprazole  20 mg daily.   Past CVA x 01/2021 and 09/2021 on ASA    CC:  Abraham Abo, MD

## 2023-10-10 NOTE — Patient Instructions (Addendum)
 Apply a small amount of Desitin inside the anal opening and to the external anal area three times daily as needed for anal or hemorrhoidal irritation/bleeding.   Do not use Desitin the night before or day of hemorrhoid banding appointment   We have sent the following medications to your pharmacy for you to pick up at your convenience: Hydrocortisone Suppositories 25mg  once daily at bedtime for 5 days  Your provider has requested that you go to the basement level for lab work before leaving today. Press "B" on the elevator. The lab is located at the first door on the left as you exit the elevator.  Please follow up sooner if symptoms increase or worsen  Due to recent changes in healthcare laws, you may see the results of your imaging and laboratory studies on MyChart before your provider has had a chance to review them.  We understand that in some cases there may be results that are confusing or concerning to you. Not all laboratory results come back in the same time frame and the provider may be waiting for multiple results in order to interpret others.  Please give us  48 hours in order for your provider to thoroughly review all the results before contacting the office for clarification of your results.   Thank you for trusting me with your gastrointestinal care!   Everett Hitt, NP _______________________________________________________  If your blood pressure at your visit was 140/90 or greater, please contact your primary care physician to follow up on this.  _______________________________________________________  If you are age 59 or older, your body mass index should be between 23-30. Your Body mass index is 24.66 kg/m. If this is out of the aforementioned range listed, please consider follow up with your Primary Care Provider.  If you are age 27 or younger, your body mass index should be between 19-25. Your Body mass index is 24.66 kg/m. If this is out of the aformentioned  range listed, please consider follow up with your Primary Care Provider.   ________________________________________________________  The [facility] providers would like to encourage you to use MYCHART to communicate with providers for non-urgent requests or questions.  Due to long hold times on the telephone, sending your provider a message by [facility] may be a faster and more efficient way to get a response.  Please allow 48 business hours for a response.  Please remember that this is for non-urgent requests.  _______________________________________________________

## 2023-10-11 ENCOUNTER — Other Ambulatory Visit (INDEPENDENT_AMBULATORY_CARE_PROVIDER_SITE_OTHER)

## 2023-10-11 DIAGNOSIS — D649 Anemia, unspecified: Secondary | ICD-10-CM | POA: Diagnosis not present

## 2023-10-11 DIAGNOSIS — K648 Other hemorrhoids: Secondary | ICD-10-CM | POA: Diagnosis not present

## 2023-10-11 DIAGNOSIS — K625 Hemorrhage of anus and rectum: Secondary | ICD-10-CM

## 2023-10-11 LAB — CBC
HCT: 36.1 % — ABNORMAL LOW (ref 39.0–52.0)
Hemoglobin: 11.7 g/dL — ABNORMAL LOW (ref 13.0–17.0)
MCHC: 32.4 g/dL (ref 30.0–36.0)
MCV: 90 fl (ref 78.0–100.0)
Platelets: 283 10*3/uL (ref 150.0–400.0)
RBC: 4.02 Mil/uL — ABNORMAL LOW (ref 4.22–5.81)
RDW: 14.9 % (ref 11.5–15.5)
WBC: 13.4 10*3/uL — ABNORMAL HIGH (ref 4.0–10.5)

## 2023-10-12 ENCOUNTER — Telehealth: Payer: Self-pay

## 2023-10-12 ENCOUNTER — Ambulatory Visit: Payer: Self-pay | Admitting: Nurse Practitioner

## 2023-10-12 NOTE — Telephone Encounter (Signed)
 Copied from CRM [id number]. Topic: General - Other >> Oct 12, 2023  3:39 PM Emylou G wrote: Reason for CRM: Family member Carles Cheadle [phone] called wants to make sure to add amlodipine  10mg  tab 1 daily back on med list.. hospital told him to stop taking it but he needs it.  Please advise

## 2023-10-15 ENCOUNTER — Ambulatory Visit: Admitting: Orthopedic Surgery

## 2023-10-15 NOTE — Telephone Encounter (Signed)
 I called patient with recommendations from PCP no one answered and there was no voicemail to leave a message

## 2023-10-15 NOTE — Telephone Encounter (Signed)
 Will continue with plan discussed in office (Lisinopril ) on 10/03/2023 with Abraham Abo, MD. Schedule follow-up with Abraham Abo, MD. During the interim report to Emergency Department/Urgent Care/call 911 for immediate medical evaluation.

## 2023-10-16 ENCOUNTER — Ambulatory Visit (INDEPENDENT_AMBULATORY_CARE_PROVIDER_SITE_OTHER): Admitting: Gastroenterology

## 2023-10-16 ENCOUNTER — Encounter: Payer: Self-pay | Admitting: Gastroenterology

## 2023-10-16 VITALS — BP 126/60 | HR 89 | Ht 69.0 in | Wt 162.0 lb

## 2023-10-16 DIAGNOSIS — K640 First degree hemorrhoids: Secondary | ICD-10-CM

## 2023-10-16 NOTE — Patient Instructions (Signed)
 HEMORRHOID BANDING PROCEDURE    FOLLOW-UP CARE   The procedure you have had should have been relatively painless since the banding of the area involved does not have nerve endings and there is no pain sensation.  The rubber band cuts off the blood supply to the hemorrhoid and the band may fall off as soon as 48 hours after the banding (the band may occasionally be seen in the toilet bowl following a bowel movement). You may notice a temporary feeling of fullness in the rectum which should respond adequately to plain Tylenol or Motrin.  Following the banding, avoid strenuous exercise that evening and resume full activity the next day.  A sitz bath (soaking in a warm tub) or bidet is soothing, and can be useful for cleansing the area after bowel movements.     To avoid constipation, take two tablespoons of natural wheat bran, natural oat bran, flax, Benefiber or any over the counter fiber supplement and increase your water intake to 7-8 glasses daily.    Unless you have been prescribed anorectal medication, do not put anything inside your rectum for two weeks: No suppositories, enemas, fingers, etc.  Occasionally, you may have more bleeding than usual after the banding procedure.  This is often from the untreated hemorrhoids rather than the treated one.  Don't be concerned if there is a tablespoon or so of blood.  If there is more blood than this, lie flat with your bottom higher than your head and apply an ice pack to the area. If the bleeding does not stop within a half an hour or if you feel faint, call our office at [phone] or go to the emergency room.  Problems are not common; however, if there is a substantial amount of bleeding, severe pain, chills, fever or difficulty passing urine (very rare) or other problems, you should call us at [phone] or report to the nearest emergency room.  Do not stay seated continuously for more than 2-3 hours for a day or two after the procedure.   Tighten your buttock muscles 10-15 times every two hours and take 10-15 deep breaths every 1-2 hours.  Do not spend more than a few minutes on the toilet if you cannot empty your bowel; instead re-visit the toilet at a later time.   Thank you for entrusting me with your care and choosing [facility].  Dr Tomasa Rand

## 2023-10-16 NOTE — Progress Notes (Signed)
 Christopher [name] is here today for hemorrhoid banding.  He reports bothersome symptoms from his hemorrhoids to include frequent bleeding soiling/seepage.  He denies symptoms of perianal pain/swelling or discomfort.  No problems with itching or burning.  He denies experiencing prolapsed hemorrhoids.   PROCEDURE NOTE: The patient presents with symptomatic grade 1  hemorrhoids, requesting rubber band ligation of his hemorrhoidal disease.  All risks, benefits and alternative forms of therapy were described and informed consent was obtained.   The anorectum was pre-medicated with topical lidocaine  (5%) and nitroglycerin (0.125%).  No external hemorrhoids, anal fissures or prolapsed hemorrhoids were noted. The decision was made to band the left lateral internal hemorrhoid, and the [facility] O'Regan System was used to perform band ligation without complication.  Digital anorectal examination was then performed to assure proper positioning of the band, and to adjust the banded tissue as required.  The patient was discharged home without pain or other issues.  Dietary and behavioral recommendations were given and along with follow-up instructions.     The following adjunctive treatments were recommended:  Fiber supplementation Adequate water intake Avoidance of straining and hard stools  The patient will return in 2-4 weeks for  follow-up and possible additional banding as required. No complications were encountered and the patient tolerated the procedure well.

## 2023-10-17 NOTE — Telephone Encounter (Signed)
 I have attempted without success to contact this patient by phone to return their call and I left a message on answering machine.

## 2023-10-28 NOTE — Progress Notes (Signed)
 Agree with the assessment and plan as outlined by Alcide Evener, NP.    Zae Kirtz E. Tomasa Rand, MD [facility]

## 2023-11-02 ENCOUNTER — Telehealth: Payer: Self-pay

## 2023-11-02 NOTE — Telephone Encounter (Signed)
 LVM * 2 to r/s appointment

## 2023-11-02 NOTE — Telephone Encounter (Signed)
 Pt was r/s for 7/15

## 2023-11-05 ENCOUNTER — Ambulatory Visit: Admitting: Orthopedic Surgery

## 2023-11-06 ENCOUNTER — Telehealth: Payer: Self-pay | Admitting: Neurology

## 2023-11-06 ENCOUNTER — Ambulatory Visit (INDEPENDENT_AMBULATORY_CARE_PROVIDER_SITE_OTHER): Admitting: Physician Assistant

## 2023-11-06 ENCOUNTER — Encounter: Payer: Self-pay | Admitting: Physician Assistant

## 2023-11-06 DIAGNOSIS — R202 Paresthesia of skin: Secondary | ICD-10-CM

## 2023-11-06 DIAGNOSIS — R200 Anesthesia of skin: Secondary | ICD-10-CM | POA: Insufficient documentation

## 2023-11-06 NOTE — Telephone Encounter (Signed)
 Inquiry about an earlier appointment

## 2023-11-06 NOTE — Progress Notes (Signed)
 Office Visit Note   Patient: Christopher [name]           Date of Birth: 03/23/1966           MRN: [id number] Visit Date: 11/06/2023              Requested by: Tanda Bleacher, MD [address] [place],  [place] [address] PCP: Tanda Bleacher, MD   Assessment & Plan: Visit Diagnoses:  1. 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  He has had numbness in his right hand since then.  Has not had any other injuries.  This has been in place and has not changed since his stroke in 2022.  He was wondering if there was anything that could be done or anything different going on.  He still actually has fairly good functionality of this right hand though has limited sensation in all of his digits.  His hand is warm he has brisk capillary refill he has fair grip strength and dexterity in his right hand he is actually left-handed.  I explained to him that if this is probably related to the stroke.  Since it has not changed in 2 years it is unlikely that he will regain full sensation in his right hand.  I do not think this is necessarily coming from an orthopedic cause given his distribution and timing.  He does have a follow-up with his neurologist in November.  Obviously loss of feeling in the hand is a disability as he has to be very careful with touching things and not injuring his hand.  May follow-up with us  as needed  Follow-Up Instructions: No follow-ups on file.   Orders:  No orders of the defined types were placed in this encounter.  No orders of the defined types were placed in this encounter.     Procedures: No procedures performed   Clinical Data: No additional findings.   Subjective: No chief complaint on file.   HPI pleasant 58 year old gentleman presents today with right hand numbness.  He is left-hand dominant.  This is occurred 3 years ago after he sustained a right sided  stroke.  He was in the hospital and treated.  Has not changed has had no injuries he has no neck pain he just had continues to have isolated right hand pain.  Review of Systems  All other systems reviewed and are negative.    Objective: Vital Signs: There were no vitals taken for this visit.  Physical Exam Pulmonary:     Effort: Pulmonary effort is normal.   Skin:    General: Skin is warm and dry.   Neurological:     General: No focal deficit present.     Mental Status: He is oriented to person, place, and time.   Psychiatric:        Mood and Affect: Mood normal.        Behavior: Behavior normal.     Ortho Exam Examination of his right hand he has brisk capillary refill no swelling no erythema pulses palpable he has good abduction and adduction of his fingers grip strength is fair.  Sensation is altered from the MCP joints out to the phalanges Specialty Comments:  No specialty comments available.  Imaging: No results found.   PMFS History: Patient Active Problem List   Diagnosis Date Noted   Numbness and tingling in  right hand 11/06/2023   Major depressive disorder, recurrent episode, moderate (HCC) 07/21/2023   COPD exacerbation (HCC) 07/12/2023   Hypokalemia 07/12/2023   Dysarthria as late effect of stroke 05/05/2022   Anomia 05/05/2022   Hemiparesis affecting right side as late effect of cerebrovascular accident (CVA) (HCC) 05/05/2022   Acute ischemic left MCA stroke (HCC) 09/14/2021   Pressure injury of skin 09/11/2021   Stroke (HCC) 09/10/2021   Gastroesophageal reflux disease without esophagitis 08/24/2021   History of CVA (cerebrovascular accident) 08/11/2021   Dyslipidemia 01/24/2021   Essential hypertension    Hyponatremia    Polysubstance abuse (HCC)    Cerebral thrombosis with cerebral infarction 01/11/2021   Acute CVA (cerebrovascular accident) (HCC) 01/11/2021   Right sided weakness 01/10/2021   AKI (acute kidney injury) (HCC) 01/10/2021    Tobacco abuse 01/10/2021   Cocaine abuse (HCC) 01/10/2021   Leukocytosis 01/10/2021   Cellulitis and abscess of upper arm and forearm 11/13/2012   Episodic tobacco abuse 11/13/2012   Past Medical History:  Diagnosis Date   GERD (gastroesophageal reflux disease)    Hyperlipidemia    Hypertension    PUD (peptic ulcer disease) 2008   treated with meds   Stroke (HCC) 01/2021   Stroke (HCC) 09/2021    Family History  Problem Relation Age of Onset   Cancer Mother    Stroke Father    Colon cancer Neg Hx    Stomach cancer Neg Hx    Esophageal cancer Neg Hx     Past Surgical History:  Procedure Laterality Date   CLAVICLE SURGERY Right    fracture repair   INGUINAL HERNIA REPAIR Right    age 40   rupture ulcer     Social History   Occupational History   Occupation: disabled  Tobacco Use   Smoking status: Every Day    Current packs/day: 0.50    Types: Cigarettes   Smokeless tobacco: Former  Building services engineer status: Never Used  Substance and Sexual Activity   Alcohol use: Yes    Comment: 2-3 sparks a day   Drug use: Not Currently    Types: Crack cocaine    Comment: only every once in awhile   Sexual activity: Not Currently

## 2023-11-07 ENCOUNTER — Ambulatory Visit (INDEPENDENT_AMBULATORY_CARE_PROVIDER_SITE_OTHER): Admitting: Family Medicine

## 2023-11-07 ENCOUNTER — Encounter: Payer: Self-pay | Admitting: Family Medicine

## 2023-11-07 VITALS — BP 137/68 | HR 87 | Wt 166.2 lb

## 2023-11-07 DIAGNOSIS — I10 Essential (primary) hypertension: Secondary | ICD-10-CM

## 2023-11-07 DIAGNOSIS — E785 Hyperlipidemia, unspecified: Secondary | ICD-10-CM | POA: Diagnosis not present

## 2023-11-07 MED ORDER — ATORVASTATIN CALCIUM 20 MG PO TABS: 20.0000 mg | ORAL_TABLET | Freq: Every day | ORAL | 1 refills | Status: DC

## 2023-11-07 MED ORDER — LISINOPRIL 20 MG PO TABS: 20.0000 mg | ORAL_TABLET | Freq: Every day | ORAL | 1 refills | Status: DC

## 2023-11-07 NOTE — Progress Notes (Signed)
 Established Patient Office Visit  Subjective    Patient ID: Christopher [name], male    DOB: May 23, 1965  Age: 58 y.o. MRN: [id number]  CC:  Chief Complaint  Patient presents with   Medication Refill    HPI Christopher [name] presents for follow up of chronic med issues including hypertension. Patient reports med compliance and denies acute complaints.   Outpatient Encounter Medications as of 11/07/2023  Medication Sig   acetaminophen  (TYLENOL ) 325 MG tablet Take 1-2 tablets (325-650 mg total) by mouth every 4 (four) hours as needed for mild pain.   aspirin  EC 81 MG tablet Take 1 tablet (81 mg total) by mouth daily. Swallow whole.   budesonide  (PULMICORT ) 0.25 MG/2ML nebulizer solution Take 2 mLs (0.25 mg total) by nebulization 2 (two) times daily.   escitalopram  (LEXAPRO ) 10 MG tablet Take 1 tablet (10 mg total) by mouth daily.   guaiFENesin  (MUCINEX ) 600 MG 12 hr tablet Take 2 tablets (1,200 mg total) by mouth 2 (two) times daily.   hydrocortisone  (ANUSOL -HC) 25 MG suppository Place 1 suppository (25 mg total) rectally at bedtime.   ipratropium-albuterol  (DUONEB) 0.5-2.5 (3) MG/3ML SOLN Take 3 mLs by nebulization 2 (two) times daily.   omeprazole  (PRILOSEC) 20 MG capsule Take 1 capsule (20 mg total) by mouth daily.   predniSONE  (DELTASONE ) 20 MG tablet Take 2 tablets (40 mg total) by mouth daily before breakfast.   [DISCONTINUED] atorvastatin  (LIPITOR) 20 MG tablet Take 1 tablet (20 mg total) by mouth daily.   [DISCONTINUED] lisinopril  (ZESTRIL ) 20 MG tablet Take 1 tablet (20 mg total) by mouth daily.   atorvastatin  (LIPITOR) 20 MG tablet Take 1 tablet (20 mg total) by mouth daily.   lisinopril  (ZESTRIL ) 20 MG tablet Take 1 tablet (20 mg total) by mouth daily.   No facility-administered encounter medications on file as of 11/07/2023.    Past Medical History:  Diagnosis Date   GERD (gastroesophageal reflux disease)    Hyperlipidemia    Hypertension    PUD (peptic ulcer disease) 2008    treated with meds   Stroke (HCC) 01/2021   Stroke (HCC) 09/2021    Past Surgical History:  Procedure Laterality Date   CLAVICLE SURGERY Right    fracture repair   INGUINAL HERNIA REPAIR Right    age 46   rupture ulcer      Family History  Problem Relation Age of Onset   Cancer Mother    Stroke Father    Colon cancer Neg Hx    Stomach cancer Neg Hx    Esophageal cancer Neg Hx     Social History   Socioeconomic History   Marital status: Divorced    Spouse name: Not on file   Number of children: 0   Years of education: Not on file   Highest education level: Not on file  Occupational History   Occupation: disabled  Tobacco Use   Smoking status: Every Day    Current packs/day: 0.50    Types: Cigarettes   Smokeless tobacco: Former  Building services engineer status: Never Used  Substance and Sexual Activity   Alcohol use: Yes    Comment: 2-3 sparks a day   Drug use: Not Currently    Types: Crack cocaine    Comment: only every once in awhile   Sexual activity: Not Currently  Other Topics Concern   Not on file  Social History Narrative   Not on file   Social Drivers of Health  Financial Resource Strain: Medium Risk (08/16/2022)   Overall Financial Resource Strain (CARDIA)    Difficulty of Paying Living Expenses: Somewhat hard  Food Insecurity: No Food Insecurity (07/13/2023)   Hunger Vital Sign    Worried About Running Out of Food in the Last Year: Never true    Ran Out of Food in the Last Year: Never true  Transportation Needs: No Transportation Needs (07/13/2023)   PRAPARE - Administrator, Civil Service (Medical): No    Lack of Transportation (Non-Medical): No  Physical Activity: Sufficiently Active (08/16/2022)   Exercise Vital Sign    Days of Exercise per Week: 7 days    Minutes of Exercise per Session: 30 min  Stress: No Stress Concern Present (02/15/2023)   [employer] of Occupational Health - Occupational Stress Questionnaire     Feeling of Stress : Not at all  Social Connections: Socially Isolated (08/16/2022)   Social Connection and Isolation Panel    Frequency of Communication with Friends and Family: More than three times a week    Frequency of Social Gatherings with Friends and Family: More than three times a week    Attends Religious Services: Never    Database administrator or Organizations: No    Attends Banker Meetings: Never    Marital Status: Separated  Intimate Partner Violence: Not At Risk (07/13/2023)   Humiliation, Afraid, Rape, and Kick questionnaire    Fear of Current or Ex-Partner: No    Emotionally Abused: No    Physically Abused: No    Sexually Abused: No    Review of Systems  All other systems reviewed and are negative.       Objective    BP 137/68 (BP Location: Right Arm, Patient Position: Sitting, Cuff Size: Normal)   Pulse 87   Wt 166 lb 3.2 oz (75.4 kg)   SpO2 92%   BMI 24.54 kg/m   Physical Exam Vitals and nursing note reviewed.  Constitutional:      General: He is not in acute distress. Cardiovascular:     Rate and Rhythm: Normal rate and regular rhythm.  Pulmonary:     Effort: Pulmonary effort is normal.     Breath sounds: Normal breath sounds.  Abdominal:     Palpations: Abdomen is soft.     Tenderness: There is no abdominal tenderness.  Neurological:     General: No focal deficit present.     Mental Status: He is alert and oriented to person, place, and time.         Assessment & Plan:   Essential hypertension  Dyslipidemia -     Atorvastatin  Calcium ; Take 1 tablet (20 mg total) by mouth daily.  Dispense: 90 tablet; Refill: 1  Other orders -     Lisinopril ; Take 1 tablet (20 mg total) by mouth daily.  Dispense: 90 tablet; Refill: 1     Return in about 6 months (around 05/09/2024) for follow up, chronic med issues.   Tanda Raguel SQUIBB, MD

## 2023-11-14 ENCOUNTER — Encounter: Payer: Self-pay | Admitting: Gastroenterology

## 2023-11-14 ENCOUNTER — Ambulatory Visit (INDEPENDENT_AMBULATORY_CARE_PROVIDER_SITE_OTHER): Admitting: Gastroenterology

## 2023-11-14 VITALS — BP 118/60 | HR 68 | Ht 69.0 in | Wt 164.0 lb

## 2023-11-14 DIAGNOSIS — K640 First degree hemorrhoids: Secondary | ICD-10-CM

## 2023-11-14 NOTE — Patient Instructions (Signed)
 HEMORRHOID BANDING PROCEDURE    FOLLOW-UP CARE   The procedure you have had should have been relatively painless since the banding of the area involved does not have nerve endings and there is no pain sensation.  The rubber band cuts off the blood supply to the hemorrhoid and the band may fall off as soon as 48 hours after the banding (the band may occasionally be seen in the toilet bowl following a bowel movement). You may notice a temporary feeling of fullness in the rectum which should respond adequately to plain Tylenol  or Motrin.  Following the banding, avoid strenuous exercise that evening and resume full activity the next day.  A sitz bath (soaking in a warm tub) or bidet is soothing, and can be useful for cleansing the area after bowel movements.     To avoid constipation, take two tablespoons of natural wheat bran, natural oat bran, flax, Benefiber or any over the counter fiber supplement and increase your water intake to 7-8 glasses daily.    Unless you have been prescribed anorectal medication, do not put anything inside your rectum for two weeks: No suppositories, enemas, fingers, etc.  Occasionally, you may have more bleeding than usual after the banding procedure.  This is often from the untreated hemorrhoids rather than the treated one.  Don't be concerned if there is a tablespoon or so of blood.  If there is more blood than this, lie flat with your bottom higher than your head and apply an ice pack to the area. If the bleeding does not stop within a half an hour or if you feel faint, call our office at [phone] or go to the emergency room.  Problems are not common; however, if there is a substantial amount of bleeding, severe pain, chills, fever or difficulty passing urine (very rare) or other problems, you should call us  at [phone] or report to the nearest emergency room.  Do not stay seated continuously for more than 2-3 hours for a day or two after the procedure.   Tighten your buttock muscles 10-15 times every two hours and take 10-15 deep breaths every 1-2 hours.  Do not spend more than a few minutes on the toilet if you cannot empty your bowel; instead re-visit the toilet at a later time.    _______________________________________________________  If your blood pressure at your visit was 140/90 or greater, please contact your primary care physician to follow up on this.  _______________________________________________________  If you are age 59 or older, your body mass index should be between 23-30. Your Body mass index is 24.22 kg/m. If this is out of the aforementioned range listed, please consider follow up with your Primary Care Provider.  If you are age 25 or younger, your body mass index should be between 19-25. Your Body mass index is 24.22 kg/m. If this is out of the aformentioned range listed, please consider follow up with your Primary Care Provider.   ________________________________________________________  The [facility] providers would like to encourage you to use MYCHART to communicate with providers for non-urgent requests or questions.  Due to long hold times on the telephone, sending your provider a message by [facility] may be a faster and more efficient way to get a response.  Please allow 48 business hours for a response.  Please remember that this is for non-urgent requests.  _______________________________________________________  It was a pleasure to see you today!  Thank you for trusting me with your gastrointestinal care!  Scott E.Stacia, MD

## 2023-11-14 NOTE — Progress Notes (Signed)
 Christopher [name] tolerated the previous banding well, but denies any significant improvement in his symptoms of bleeding and soiling.  He would like to proceed with further banding.  Previous banding June 10:  Left lateral column   PROCEDURE NOTE: The patient presents with symptomatic grade 1  hemorrhoids, requesting rubber band ligation of his hemorrhoidal disease.  All risks, benefits and alternative forms of therapy were described and informed consent was obtained.   The anorectum was pre-medicated with topical lidocaine  (5%) and nitroglycerin (0.125%) The decision was made to band the right posterior internal hemorrhoid, and the [facility] O'Regan System was used to perform band ligation without complication.  Digital anorectal examination was then performed to assure proper positioning of the band, and to adjust the banded tissue as required.  The patient was discharged home without pain or other issues.  Dietary and behavioral recommendations were given and along with follow-up instructions.     The following adjunctive treatments were recommended:  Fiber supplementation Adequate water intake Avoidance of straining and hard stools  The patient will return in 2-4 weeks for  follow-up and possible additional banding as required. No complications were encountered and the patient tolerated the procedure well.

## 2023-11-19 ENCOUNTER — Telehealth: Payer: Self-pay | Admitting: Neurology

## 2023-11-19 ENCOUNTER — Ambulatory Visit: Payer: Self-pay

## 2023-11-19 NOTE — Telephone Encounter (Signed)
 Attempted to call patient and patient's sister to advised patient to go to ED or call 911 for S/S. Unable to reach message left.

## 2023-11-19 NOTE — Telephone Encounter (Signed)
 Report to Emergency Department/Urgent Care/call 911 for immediate medical evaluation. Follow-up with Primary Care.

## 2023-11-19 NOTE — Telephone Encounter (Signed)
 FYI Only or Action Required?: Action required by provider: referral request.  Needs a new referral to neurology  Patient was last seen in primary care on 11/07/2023 by Tanda Bleacher, MD.  Called Nurse Triage reporting Neurologic Problem.  Symptoms began about a month ago.  Interventions attempted: Rest, hydration, or home remedies.  Symptoms are: unchanged.  Triage Disposition: See PCP When Office is Open (Within 3 Days)  Patient/caregiver understands and will follow disposition?: Yes   Copied from CRM [id number]. Topic: Clinical - Red Word Triage >> Nov 19, 2023 11:15 AM Tiffany B wrote: Red Word that prompted transfer to Nurse Triage: Caller states patient has black outs and just stares into space and face turns red and has 4 spells within a month. Caller states she thinks her brother is have a light stroke and possible seizures. Reason for Disposition  [1] Numbness or tingling on both sides of body AND [2] is a new symptom present > 24 hours  Answer Assessment - Initial Assessment Questions 1. SYMPTOM: What is the main symptom you are concerned about? (e.g., weakness, numbness)     Black out and seizure like activity 2. ONSET: When did this start? (e.g., minutes, hours, days; while sleeping)     A month  3. LAST NORMAL: When was the last time you (the patient) were normal (no symptoms)?     A month ago 4. PATTERN Does this come and go, or has it been constant since it started?  Is it present now?     no 5. CARDIAC SYMPTOMS: Have you had any of the following symptoms: chest pain, difficulty breathing, palpitations?     no 6. NEUROLOGIC SYMPTOMS: Have you had any of the following symptoms: headache, dizziness, vision loss, double vision, changes in speech, unsteady on your feet?     dizziness 7. OTHER SYMPTOMS: Do you have any other symptoms?     Body goes stiff and last 2-5 minutes  Protocols used: Neurologic Deficit-A-AH

## 2023-11-19 NOTE — Telephone Encounter (Signed)
 Patient's sister, Winton Bollman Patient 3 or 4 episode with in the past month; gets lightheaded, face turns red, dizziness, patient stretches out then get stiff, lasts 2 to 5 minutes. Have not called EMS during either episode, sets with him and talks to him until comes out of the spell.  Informed Ms. McCuiston for new symptoms would need a referral sent over and would be glad to schedule appt.

## 2023-11-19 NOTE — Telephone Encounter (Signed)
 Per phone staff the sister will reach out about having a referral sent for the new problems and concerns.

## 2023-11-20 ENCOUNTER — Ambulatory Visit: Admitting: Orthopedic Surgery

## 2023-11-20 ENCOUNTER — Encounter: Payer: Self-pay | Admitting: Family Medicine

## 2023-11-20 ENCOUNTER — Ambulatory Visit (INDEPENDENT_AMBULATORY_CARE_PROVIDER_SITE_OTHER): Admitting: Family Medicine

## 2023-11-20 VITALS — BP 124/67 | HR 85 | Ht 69.0 in | Wt 164.4 lb

## 2023-11-20 DIAGNOSIS — Z8673 Personal history of transient ischemic attack (TIA), and cerebral infarction without residual deficits: Secondary | ICD-10-CM | POA: Diagnosis not present

## 2023-11-20 DIAGNOSIS — R42 Dizziness and giddiness: Secondary | ICD-10-CM

## 2023-11-20 DIAGNOSIS — I10 Essential (primary) hypertension: Secondary | ICD-10-CM

## 2023-11-20 DIAGNOSIS — R55 Syncope and collapse: Secondary | ICD-10-CM

## 2023-11-20 MED ORDER — GUAIFENESIN ER 600 MG PO TB12: 1200.0000 mg | ORAL_TABLET | Freq: Two times a day (BID) | ORAL | 0 refills | Status: AC

## 2023-11-20 NOTE — Progress Notes (Unsigned)
 Established Patient Office Visit  Subjective    Patient ID: Christopher [name], male    DOB: 1965-09-24  Age: 58 y.o. MRN: [id number]  CC:  Chief Complaint  Patient presents with   Loss of Consciousness    Needs new referral to neurology due to having new concerns    HPI Christopher [name] presents with complaint of intermittent loss of consciousness with dizziness. He has a past history of CVA. He has been seen at ED recently. He is desiring a referral to neuro for further eval/mgt  Outpatient Encounter Medications as of 11/20/2023  Medication Sig   acetaminophen  (TYLENOL ) 325 MG tablet Take 1-2 tablets (325-650 mg total) by mouth every 4 (four) hours as needed for mild pain.   aspirin  EC 81 MG tablet Take 1 tablet (81 mg total) by mouth daily. Swallow whole.   atorvastatin  (LIPITOR) 20 MG tablet Take 1 tablet (20 mg total) by mouth daily.   budesonide  (PULMICORT ) 0.25 MG/2ML nebulizer solution Take 2 mLs (0.25 mg total) by nebulization 2 (two) times daily.   escitalopram  (LEXAPRO ) 10 MG tablet Take 1 tablet (10 mg total) by mouth daily.   hydrocortisone  (ANUSOL -HC) 25 MG suppository Place 1 suppository (25 mg total) rectally at bedtime.   ipratropium-albuterol  (DUONEB) 0.5-2.5 (3) MG/3ML SOLN Take 3 mLs by nebulization 2 (two) times daily.   lisinopril  (ZESTRIL ) 20 MG tablet Take 1 tablet (20 mg total) by mouth daily.   omeprazole  (PRILOSEC) 20 MG capsule Take 1 capsule (20 mg total) by mouth daily.   predniSONE  (DELTASONE ) 20 MG tablet Take 2 tablets (40 mg total) by mouth daily before breakfast.   [DISCONTINUED] guaiFENesin  (MUCINEX ) 600 MG 12 hr tablet Take 2 tablets (1,200 mg total) by mouth 2 (two) times daily.   guaiFENesin  (MUCINEX ) 600 MG 12 hr tablet Take 2 tablets (1,200 mg total) by mouth 2 (two) times daily.   No facility-administered encounter medications on file as of 11/20/2023.    Past Medical History:  Diagnosis Date   GERD (gastroesophageal reflux disease)     Hyperlipidemia    Hypertension    PUD (peptic ulcer disease) 2008   treated with meds   Stroke (HCC) 01/2021   Stroke (HCC) 09/2021    Past Surgical History:  Procedure Laterality Date   CLAVICLE SURGERY Right    fracture repair   INGUINAL HERNIA REPAIR Right    age 55   rupture ulcer      Family History  Problem Relation Age of Onset   Cancer Mother    Stroke Father    Colon cancer Neg Hx    Stomach cancer Neg Hx    Esophageal cancer Neg Hx     Social History   Socioeconomic History   Marital status: Divorced    Spouse name: Not on file   Number of children: 0   Years of education: Not on file   Highest education level: Not on file  Occupational History   Occupation: disabled  Tobacco Use   Smoking status: Every Day    Current packs/day: 0.50    Types: Cigarettes   Smokeless tobacco: Former  Building services engineer status: Never Used  Substance and Sexual Activity   Alcohol use: Yes    Comment: 2-3 sparks a day   Drug use: Not Currently    Types: Crack cocaine    Comment: only every once in awhile   Sexual activity: Not Currently  Other Topics Concern   Not on  file  Social History Narrative   Not on file   Social Drivers of Health   Financial Resource Strain: Medium Risk (08/16/2022)   Overall Financial Resource Strain (CARDIA)    Difficulty of Paying Living Expenses: Somewhat hard  Food Insecurity: No Food Insecurity (07/13/2023)   Hunger Vital Sign    Worried About Running Out of Food in the Last Year: Never true    Ran Out of Food in the Last Year: Never true  Transportation Needs: No Transportation Needs (07/13/2023)   PRAPARE - Administrator, Civil Service (Medical): No    Lack of Transportation (Non-Medical): No  Physical Activity: Sufficiently Active (08/16/2022)   Exercise Vital Sign    Days of Exercise per Week: 7 days    Minutes of Exercise per Session: 30 min  Stress: No Stress Concern Present (02/15/2023)   [employer]  of Occupational Health - Occupational Stress Questionnaire    Feeling of Stress : Not at all  Social Connections: Socially Isolated (08/16/2022)   Social Connection and Isolation Panel    Frequency of Communication with Friends and Family: More than three times a week    Frequency of Social Gatherings with Friends and Family: More than three times a week    Attends Religious Services: Never    Database administrator or Organizations: No    Attends Banker Meetings: Never    Marital Status: Separated  Intimate Partner Violence: Not At Risk (07/13/2023)   Humiliation, Afraid, Rape, and Kick questionnaire    Fear of Current or Ex-Partner: No    Emotionally Abused: No    Physically Abused: No    Sexually Abused: No    Review of Systems  Neurological:  Positive for dizziness. Negative for seizures.  All other systems reviewed and are negative.       Objective    BP 124/67   Pulse 85   Ht 5' 9 (1.753 m)   Wt 164 lb 6.4 oz (74.6 kg)   SpO2 94%   BMI 24.28 kg/m   Physical Exam Vitals and nursing note reviewed.  Constitutional:      General: He is not in acute distress. Cardiovascular:     Rate and Rhythm: Normal rate and regular rhythm.  Pulmonary:     Effort: Pulmonary effort is normal.     Breath sounds: Normal breath sounds.  Abdominal:     Palpations: Abdomen is soft.     Tenderness: There is no abdominal tenderness.  Neurological:     General: No focal deficit present.     Mental Status: He is alert and oriented to person, place, and time.         Assessment & Plan:   Syncope, unspecified syncope type -     Ambulatory referral to Neurology  Dizziness and giddiness -     Ambulatory referral to Neurology  History of CVA (cerebrovascular accident)  Essential hypertension  Other orders -     guaiFENesin  ER; Take 2 tablets (1,200 mg total) by mouth 2 (two) times daily.  Dispense: 20 tablet; Refill: 0     Return in about 3 months (around  02/20/2024) for follow up.   Tanda Raguel SQUIBB, MD

## 2023-11-21 ENCOUNTER — Telehealth: Payer: Self-pay | Admitting: Neurology

## 2023-11-21 NOTE — Telephone Encounter (Signed)
 Appointment details confirmed

## 2023-11-22 NOTE — Telephone Encounter (Signed)
 Patient seen MD Tanda on 11/20/2023

## 2023-12-05 ENCOUNTER — Ambulatory Visit: Payer: Self-pay

## 2023-12-05 ENCOUNTER — Telehealth: Payer: Self-pay | Admitting: Neurology

## 2023-12-05 NOTE — Telephone Encounter (Signed)
 Noted, I will also try and keep eye open for cancellation for the patient.

## 2023-12-05 NOTE — Telephone Encounter (Signed)
 FYI Only or Action Required?: FYI only for provider.  Patient was last seen in primary care on 11/20/2023 by Tanda Bleacher, MD.  Called Nurse Triage reporting Seizures.  Symptoms began today.  Interventions attempted: Nothing.  Symptoms are: stable.  Triage Disposition: See HCP Within 4 Hours (Or PCP Triage)  Patient/caregiver understands and will follow disposition?: No, refuses disposition  Office called and informed about the ED refusal.    Copied from CRM #[id number]. Topic: Clinical - Red Word Triage >> Dec 05, 2023  2:27 PM Tobias L wrote: Red Word that prompted transfer to Nurse Triage: patient had seizure, ems at house requesting to speak  to nurse     Reason for Disposition  Not on or ran out of antiseizure medicine (anticonvulsant)  Answer Assessment - Initial Assessment Questions EMS was called and patient refused to go to the ED.      1. ONSET: When did the seizure occur?     Today  2. DURATION: How long did the seizure last (or how long has it been happening)? (e.g., seconds, minutes)  Note: Most seizures last less than 5 minutes.     4 minutes  3. DESCRIPTION: Describe what happened during the seizure. Did the body become stiff? Was there any jerking?  Did they lose consciousness during the seizure?     Began convulsing while standing but did not fall 4. CIRCUMSTANCE: What was the person doing when the seizure began?      Standing talking to his sister  5. MENTAL STATUS AFTER SEIZURE: Does the person seem more groggy or sleepy? Does the person know who they are, who you are, and where they are now?      Alert and oriented  6. PRIOR SEIZURES: Has the person had a seizure (convulsion) before? (e.g., epilepsy, other cause)  If Yes, ask: When was the last time? and What happened last time?      Yes, has been having for the last month  7. EPILEPSY: Does the person have epilepsy? Note: Check for medical ID bracelet.     No 8. MEDICINES:  Does the person take anticonvulsant medications? (e.g., Yes, No; missed doses, any recent changes)     No 9. INJURY: Was the person hurt or injured during the seizure? (e.g., hit their head, bit their tongue)     No 10. OTHER SYMPTOMS: Are there any other symptoms? (e.g., fever, headache)       No  Protocols used: [employer]

## 2023-12-05 NOTE — Telephone Encounter (Signed)
 Spoke to patient's sister. Advised they need to report the seizure to pt's neurologist and try to get in to see neuro sooner. Sister stated understanding

## 2023-12-05 NOTE — Telephone Encounter (Signed)
 Patient's sister Winton called in, stated patient had another seizure today. Stated it lasted 4 minutes, she called EMS they came and evaluated patient and offered to take him to ER, patient refused and stated he did not want to go back because last time he went he was there for 12 days. Peggy states EMS said pt had irregular heart rate. I let her know I do not have any sooner appointments at this time, but he is on wait list for any oepnings. Stated his PCP told her to call and let us  know this

## 2024-01-03 ENCOUNTER — Ambulatory Visit: Admitting: Family Medicine

## 2024-01-04 ENCOUNTER — Other Ambulatory Visit: Payer: Self-pay | Admitting: Family Medicine

## 2024-01-04 NOTE — Telephone Encounter (Signed)
 Copied from CRM [id number]. Topic: Clinical - Medication Refill >> Jan 04, 2024 11:33 AM Treva T wrote: Medication: escitalopram  (LEXAPRO ) 10 MG tablet  Has the patient contacted their pharmacy? Yes, advised to contact office   This is the patient's preferred pharmacy:  [facility] [address] ([address]), [place] - [address] [place] ([place]) [place] [address] Phone: [phone] Fax: [phone]   Is this the correct pharmacy for this prescription? Yes If no, delete pharmacy and type the correct one.   Has the prescription been filled recently? Yes  Is the patient out of the medication? Yes  Has the patient been seen for an appointment in the last year OR does the patient have an upcoming appointment? Yes  Can we respond through MyChart? No,prefers a phone call [phone]  Agent: Please be advised that Rx refills may take up to 3 business days. We ask that you follow-up with your pharmacy.

## 2024-01-06 NOTE — Telephone Encounter (Signed)
 Requested medication (s) are due for refill today: Yes  Requested medication (s) are on the active medication list: Yes  Last refill:  07/24/23  Future visit scheduled: Yes  Notes to clinic:  Unable to refill per protocol, last refill by another provider at discharge from hospital     Requested Prescriptions  Pending Prescriptions Disp Refills   escitalopram  (LEXAPRO ) 10 MG tablet 30 tablet 2    Sig: Take 1 tablet (10 mg total) by mouth daily.     Psychiatry:  Antidepressants - SSRI Passed - 01/06/2024 10:39 PM      Passed - Completed PHQ-2 or PHQ-9 in the last 360 days      Passed - Valid encounter within last 6 months    Recent Outpatient Visits           1 month ago Syncope, unspecified syncope type   [place] [facility], MD   2 months ago Essential hypertension   [place] [facility], MD   3 months ago Essential hypertension   [place] [facility], MD   7 months ago Hemorrhoids, unspecified hemorrhoid type   [place] [facility], MD   9 months ago Rectal bleeding   [place] [facility], MD

## 2024-01-08 NOTE — Telephone Encounter (Unsigned)
 Copied from CRM [id number]. Topic: Clinical - Prescription Issue >> Jan 08, 2024 11:58 AM Carlatta H wrote: Reason for CRM: Please call [phone] to advised patient sister Winton about prescription statuss

## 2024-01-09 MED ORDER — ESCITALOPRAM OXALATE 10 MG PO TABS: 10.0000 mg | ORAL_TABLET | Freq: Every day | ORAL | 1 refills | Status: DC

## 2024-01-11 ENCOUNTER — Encounter: Payer: Self-pay | Admitting: Gastroenterology

## 2024-01-11 ENCOUNTER — Ambulatory Visit: Admitting: Gastroenterology

## 2024-01-11 VITALS — BP 126/56 | HR 112 | Ht 69.0 in | Wt 164.0 lb

## 2024-01-11 DIAGNOSIS — K642 Third degree hemorrhoids: Secondary | ICD-10-CM

## 2024-01-11 NOTE — Progress Notes (Signed)
 Christopher [name] reports improvement in his hemorrhoid symptoms, but does continue to experience regular bleeding.  He tolerated the last banding session well with no problems. He would like to proceed with further banding.   Previous banding June 10:  Left lateral column July 9: Right posterior     PROCEDURE NOTE: The patient presents with symptomatic grade 3  hemorrhoids, requesting rubber band ligation of his/her hemorrhoidal disease.  All risks, benefits and alternative forms of therapy were described and informed consent was obtained.   The anorectum was pre-medicated with topical lidocaine  (5%) and nitroglycerin (0.125%) The decision was made to band the right anterior internal hemorrhoid, and the [facility] System was used to perform band ligation without complication.  Digital anorectal examination was then performed to assure proper positioning of the band, and to adjust the banded tissue as required.  The patient was discharged home without pain or other issues.  Dietary and behavioral recommendations were given and along with follow-up instructions.     The following adjunctive treatments were recommended:  Fiber supplementation Adequate water intake Avoidance of straining and hard stools  The patient will return in 2-4 weeks for  follow-up and possible additional banding as required. No complications were encountered and the patient tolerated the procedure well.

## 2024-01-11 NOTE — Patient Instructions (Signed)

## 2024-02-28 ENCOUNTER — Telehealth: Payer: Self-pay | Admitting: Neurology

## 2024-02-28 NOTE — Telephone Encounter (Signed)
 Appointment details confirmed for pt's sister

## 2024-03-05 ENCOUNTER — Encounter: Payer: Self-pay | Admitting: Neurology

## 2024-03-05 ENCOUNTER — Ambulatory Visit: Admitting: Neurology

## 2024-03-05 VITALS — BP 143/70 | HR 111 | Ht 69.0 in | Wt 174.0 lb

## 2024-03-05 DIAGNOSIS — Z8673 Personal history of transient ischemic attack (TIA), and cerebral infarction without residual deficits: Secondary | ICD-10-CM

## 2024-03-05 DIAGNOSIS — R251 Tremor, unspecified: Secondary | ICD-10-CM

## 2024-03-05 DIAGNOSIS — R404 Transient alteration of awareness: Secondary | ICD-10-CM

## 2024-03-05 DIAGNOSIS — R55 Syncope and collapse: Secondary | ICD-10-CM | POA: Diagnosis not present

## 2024-03-05 NOTE — Patient Instructions (Signed)
 I had a long discussion with patient and his sister with regards to his new episodes of transient orthostatic tremulousness, stating brief loss of consciousness possibly to include orthostatic tremors versus complex partial seizures or presyncopal episodes.  Recommend further evaluation with checking MRI scan of the brain, CT angiogram of brain and neck, EEG and checking lipid profile hemoglobin A1c and thyroid function test.  Continue aspirin  for stroke prevention and maintain aggressive risk factor modification with strict control of diabetes with hemoglobin A1c goal below lipids with LDL cholesterol goal below 70 mg percent and blood pressure goal below 130/90. He was advised to quit smoking and says he will try. Also advised him to follow-up with his primary care physician to discuss the need for steroids as he is clearly having facial puffiness and pedal edema and bruising which may be signs of bruising which may be side effects from steroids. Return for f/u in 6 months with my nurse practitioner or call earlier if needed.

## 2024-03-05 NOTE — Progress Notes (Signed)
 [facility] [address] [place]. [place] [address] [phone]       OFFICE FOLLOW-UP NOTE  [name] Date of Birth:  1965-12-13 Medical Record Number:  [id number]   HPI: Initial visit 11/30/2021 Christopher [name] is a 58 year old Caucasian male seen today for initial office follow-up visit following hospital admission for stroke in May 2023.  History is obtained from the patient and review of electronic medical records.  I have personally reviewed pertinent available imaging films in PACS.  He has past medical history of hypertension, hyperlipidemia, peripheral vascular disease, stroke and history of cocaine, tobacco and marijuana abuse who presented to [name], ED on 09/10/2021 with sudden onset of slurred speech and right-sided weakness.  NIH stroke scale on admission was 5.  Code stroke was called and CT head on admission showed an old right occipital infarct which was new compared to previous CT from September.  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  He was treated with thrombolysis with and kept in the ICU with close neurological monitoring and strict blood pressure control.  MRI scan subsequently showed acute posterior left MCA perirolandic infarct with some petechial hemorrhage.  2D echo showed ejection fraction 60 to 65% without cardiac source of embolism.  LDL cholesterol 68 mg percent and hemoglobin A1c was 6.2.  Patient was on no antithrombotic therapy prior to admission and was switched to aspirin .  He showed improvement during hospitalization but was felt to be a good candidate for inpatient rehab and was transferred there.  Patient has been discharged home and is currently doing outpatient physical occupational therapy.  His speech has improved back to normal rate  still has some residual weakness in his right hand and diminished fine motor skills.  He states he is quit cocaine completely and has cut back smoking cigarettes to half [name] per day.  He occasionally uses marijuana still.  He states his blood pressure is normally not high but today it is elevated in office at 179/94.  Plans to see his primary care physician and discuss medications for this soon.  He is tolerating Lipitor well without muscle aches and pain and aspirin  without bruising or bleeding.  He has no complaints today. Update 06/13/2022 ; he returns for follow-up after last visit 6 months ago.  Patient states he is doing well and has not had any recurrent stroke or TIA symptoms.  He remains on aspirin  325 mg daily which is tolerating well without side effects.  He states his blood pressure is under good control and today it is 131/69.  He however continues to smoke about half [name] per day and knows that he needs to cut back and quit.  He is tolerating Lipitor well without muscle aches and pains.  He has not had any lab work for lipid profile to check.  He has a new complaint of posterior neck pain.  He describes a tender spot in his left suboccipital region pain shooting down muscle tightness left side.  He feels he gets some partial relief by rubbing that area.  Tylenol  also eases the pain but it returns when the effect wears off.  He has not tried any muscle relaxant.  He has applied for disability which was denied but he is now appealing through a clinical research associate.  He continues to have weakness and right hand with diminished fine motor skills as well as sensation has difficulty using his right hand Update 03/05/2024 : Patient is referred back for neurological evaluation by primary physician Christopher [name] for new complaints of syncopal episodes versus tremulousness upon standing.  Patient states since March of this year has had multiple episodes when he first stands up from a chair he may be staring with a blank  look and starts trembling.  When he tries to walk he takes short steps.  On a few occasions he is passed out without warning for 3 to 5 minutes.  When he wakes up he feels weak and drained and tired.  This has been occurring at a variable frequency but of late has increased to about 2-3 times a week.  On 1 occasion EMS was called but by the time they arrived he is returned back to his baseline.  Patient denies any palpitations chest discomfort or sweating during these episodes.  Denies any tonic-clonic activity tongue biting injury or incontinence.  He has no prior history of seizures or loss of consciousness.  He denies any history of palpitations cardiac arrhythmias and episodes.   ROS:   14 system review of systems is positive for passing out, tremulousness, staring, unresponsiveness dysarthria, facial weakness, and weakness all other systems negative  PMH:  Past Medical History:  Diagnosis Date   GERD (gastroesophageal reflux disease)    Hyperlipidemia    Hypertension    PUD (peptic ulcer disease) 2008   treated with meds   Stroke (HCC) 01/2021   Stroke (HCC) 09/2021    Social History:  Social History   Socioeconomic History   Marital status: Divorced    Spouse name: Not on file   Number of children: 0   Years of education: Not on file   Highest education level: Not on file  Occupational History   Occupation: disabled  Tobacco Use   Smoking status: Every Day    Current packs/day: 0.50    Types: Cigarettes   Smokeless tobacco: Former  Building Services Engineer status: Never Used  Substance and Sexual Activity   Alcohol use: Yes    Comment: 2-3 sparks a day   Drug use: Not Currently    Types: Crack cocaine    Comment: only every once in awhile   Sexual activity: Not Currently  Other Topics Concern   Not on file  Social History Narrative   Not on file   Social Drivers of Health   Financial Resource Strain: Medium Risk (08/16/2022)   Overall Financial Resource Strain  (CARDIA)    Difficulty of Paying Living Expenses: Somewhat hard  Food Insecurity: No Food Insecurity (07/13/2023)   Hunger Vital Sign    Worried About Running Out of Food in the Last Year: Never true    Ran Out of Food in the Last Year: Never true  Transportation Needs: No Transportation Needs (07/13/2023)   PRAPARE - Administrator, Civil Service (Medical): No    Lack of Transportation (Non-Medical): No  Physical Activity: Sufficiently Active (08/16/2022)   Exercise Vital Sign    Days of Exercise per Week: 7 days    Minutes of Exercise per Session: 30 min  Stress: No Stress Concern Present (02/15/2023)   [employer] of Occupational Health - Occupational Stress Questionnaire    Feeling of Stress : Not at all  Social Connections: Socially Isolated (08/16/2022)   Social Connection and Isolation Panel  Frequency of Communication with Friends and Family: More than three times a week    Frequency of Social Gatherings with Friends and Family: More than three times a week    Attends Religious Services: Never    Database Administrator or Organizations: No    Attends Banker Meetings: Never    Marital Status: Separated  Intimate Partner Violence: Not At Risk (07/13/2023)   Humiliation, Afraid, Rape, and Kick questionnaire    Fear of Current or Ex-Partner: No    Emotionally Abused: No    Physically Abused: No    Sexually Abused: No    Medications:   Current Outpatient Medications on File Prior to Visit  Medication Sig Dispense Refill   acetaminophen  (TYLENOL ) 325 MG tablet Take 1-2 tablets (325-650 mg total) by mouth every 4 (four) hours as needed for mild pain.     amLODipine  (NORVASC ) 10 MG tablet Take 10 mg by mouth daily.     aspirin  EC 81 MG tablet Take 1 tablet (81 mg total) by mouth daily. Swallow whole. 30 tablet 12   atorvastatin  (LIPITOR) 20 MG tablet Take 1 tablet (20 mg total) by mouth daily. 90 tablet 1   escitalopram  (LEXAPRO ) 10 MG tablet Take 1  tablet (10 mg total) by mouth daily. 90 tablet 1   lisinopril  (ZESTRIL ) 20 MG tablet Take 1 tablet (20 mg total) by mouth daily. 90 tablet 1   omeprazole  (PRILOSEC) 20 MG capsule Take 1 capsule (20 mg total) by mouth daily. 90 capsule 1   predniSONE  (DELTASONE ) 20 MG tablet Take 2 tablets (40 mg total) by mouth daily before breakfast. 2 tablet 0   budesonide  (PULMICORT ) 0.25 MG/2ML nebulizer solution Take 2 mLs (0.25 mg total) by nebulization 2 (two) times daily. (Patient not taking: Reported on 03/05/2024) 60 mL 12   guaiFENesin  (MUCINEX ) 600 MG 12 hr tablet Take 2 tablets (1,200 mg total) by mouth 2 (two) times daily. (Patient not taking: Reported on 03/05/2024) 20 tablet 0   hydrocortisone  (ANUSOL -HC) 25 MG suppository Place 1 suppository (25 mg total) rectally at bedtime. (Patient not taking: Reported on 03/05/2024) 5 suppository 0   ipratropium-albuterol  (DUONEB) 0.5-2.5 (3) MG/3ML SOLN Take 3 mLs by nebulization 2 (two) times daily. (Patient not taking: Reported on 03/05/2024) 360 mL 0   No current facility-administered medications on file prior to visit.    Allergies:  No Known Allergies  Physical Exam General: well developed, well nourished middle-aged Caucasian male, seated, in no evident distress Head: head normocephalic and atraumatic.  Neck: supple with no carotid or supraclavicular bruits Cardiovascular: regular rate and rhythm, no murmurs Musculoskeletal: no deformity.  Mild tenderness in posterior neck muscles on the left Skin:  no rash/petichiae Vascular:  Normal pulses all extremities Vitals:   03/05/24 1104  BP: (!) 143/70  Pulse: (!) 111  SpO2: 96%   Neurologic Exam Mental Status: Awake and fully alert. Oriented to place and time. Recent and remote memory intact. Attention span, concentration and fund of knowledge appropriate. Mood and affect appropriate.  Mild dysarthria.  No aphasia. Cranial Nerves: Fundoscopic exam not done. Pupils equal, briskly reactive to light.  Extraocular movements full without nystagmus. Visual fields full to confrontation. Hearing intact. Facial sensation intact.  Right lower facial weakness.  Tongue, palate moves normally and symmetrically.  Motor: Normal bulk and tone. Normal strength in all tested extremity muscles except weakness of right grip and intrinsic hand muscles.  Orbits left over right upper extremity.. Sensory.: intact to touch ,  pinprick .position and vibratory sensation except some diminished sensation in the right.  Intermittent coarse action tremor of the left upper extremity.  No cogwheel rigidity or bradykinesia. Coordination: Rapid alternating movements normal in all extremities. Finger-to-nose and heel-to-shin performed accurately bilaterally. Gait and Station: Arises from chair without difficulty. Stance is normal. Gait demonstrates normal stride length and balance . Able to heel, toe and tandem walk without difficulty.  Reflexes: 1+ and symmetric. Toes downgoing.      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  Multiple vascular risk factors of intracranial atherosclerosis, hyperlipidemia, hypertension, cocaine abuse, cigarette smoking and marijuana abuse.  New onset episodes of brief altered consciousness and tremulousness with occasional loss of consciousness of unclear etiology.  Possibilities include orthostatic tremor versus near syncope or complex partial seizures.     PLAN: II had a long discussion with patient and his sister with regards to his new episodes of transient orthostatic tremulousness, stating brief loss of consciousness possibly to include orthostatic tremors versus complex partial seizures or presyncopal episodes.  Recommend further evaluation with checking MRI scan of the brain, CT angiogram of brain and neck, EEG and checking lipid profile hemoglobin A1c and thyroid function test.   Continue aspirin  for stroke prevention and maintain aggressive risk factor modification with strict control of diabetes with hemoglobin A1c goal below lipids with LDL cholesterol goal below 70 mg percent and blood pressure goal below 130/90. He was advised to quit smoking and says he will try. Also advised him to follow-up with his primary care physician to discuss the need for steroids as he is clearly having facial puffiness and pedal edema and bruising which may be signs of bruising which may be side effects from steroids. Return for f/u in 6 months with my nurse practitioner or call earlier if needed.   I personally spent a total of 40 minutes in the care of the patient today including getting/reviewing separately obtained history, performing a medically appropriate exam/evaluation, counseling and educating, placing orders, referring and communicating with other health care professionals, documenting clinical information in the EHR, independently interpreting results, and coordinating care.        Eather Popp, MD Note: This document was prepared with digital dictation and possible smart phrase technology. Any transcriptional errors that result from this process are unintentional

## 2024-03-06 ENCOUNTER — Telehealth: Payer: Self-pay | Admitting: Neurology

## 2024-03-06 ENCOUNTER — Other Ambulatory Visit: Payer: Self-pay | Admitting: Neurology

## 2024-03-06 ENCOUNTER — Ambulatory Visit: Payer: Self-pay | Admitting: Neurology

## 2024-03-06 LAB — LIPID PANEL
Chol/HDL Ratio: 3.2 ratio (ref 0.0–5.0)
Cholesterol, Total: 208 mg/dL — ABNORMAL HIGH (ref 100–199)
HDL: 65 mg/dL (ref >39)
LDL Chol Calc (NIH): 83 mg/dL (ref 0–99)
Triglycerides: 377 mg/dL — ABNORMAL HIGH (ref 0–149)
VLDL Cholesterol Cal: 60 mg/dL — ABNORMAL HIGH (ref 5–40)

## 2024-03-06 LAB — THYROID PANEL WITH TSH
Free Thyroxine Index: 1.8 (ref 1.2–4.9)
T3 Uptake Ratio: 26 % (ref 24–39)
T4, Total: 6.8 ug/dL (ref 4.5–12.0)
TSH: 2.29 u[IU]/mL (ref 0.450–4.500)

## 2024-03-06 LAB — HEMOGLOBIN A1C
Est. average glucose Bld gHb Est-mCnc: 128 mg/dL
Hgb A1c MFr Bld: 6.1 % — ABNORMAL HIGH (ref 4.8–5.6)

## 2024-03-06 MED ORDER — LORAZEPAM 0.5 MG PO TABS: 0.5000 mg | ORAL_TABLET | ORAL | 0 refills | Status: AC

## 2024-03-06 NOTE — Telephone Encounter (Signed)
 Called sister/Peggy (on [place]) at [phone]. LVM

## 2024-03-06 NOTE — Telephone Encounter (Signed)
 Patient's sister Winton Bollman calling to request medication for patient to take before MRI on November 30 for claustrophobic. Can send to [facility] [phone]

## 2024-03-06 NOTE — Progress Notes (Signed)
 Kindly inform the patient that cholesterol profile is significantly worse from a year ago.  His screening test for diabetes has also worsened and now suggest borderline diabetes.  Thyroid profile is quite normal.  Kindly see your primary care physician for advice on better treatment of his cholesterol and borderline diabetes

## 2024-03-10 NOTE — Telephone Encounter (Signed)
 Peggy informed Rx has been sent to pharmacy.

## 2024-03-11 ENCOUNTER — Ambulatory Visit: Payer: Self-pay

## 2024-03-11 NOTE — Telephone Encounter (Signed)
 FYI Only or Action Required?: FYI only for provider: ED advised. Please call [phone] for follow up with patient Patient was last seen in primary care on 11/20/2023 by Tanda Bleacher, MD.  Called Nurse Triage reporting Facial Swelling and Foot Swelling.  Symptoms began about a month ago.  Interventions attempted: Nothing.  Symptoms are: gradually worsening.  Triage Disposition: See HCP Within 4 Hours (Or PCP Triage)  Patient/caregiver understands and will follow disposition?: Unsure  Copied from CRM #[id number]. Topic: Clinical - Red Word Triage >> Mar 11, 2024 11:22 AM Hadassah PARAS wrote: Red Word that prompted transfer to Nurse Triage: Swollen face, neck, feet; Spots coming up on arms , neurologist stated this could be due to the prednisone  Reason for Disposition  SEVERE face swelling (e.g., entire face)  Mild localized itching  Answer Assessment - Initial Assessment Questions 1. ONSET: When did the swelling start? (e.g., minutes, hours, days)     About one month ago 2. LOCATION: What part of the face is swollen? (e.g., cheek, entire face, jaw joint area, under jaw)     Forehead, around eyes, cheeks, and down neck  4. ITCHING: Is there any itching? If Yes, ask: How much?   (Scale 1-10; mild, moderate or severe)     denies 5. PAIN: Is the swelling painful to touch? If Yes, ask: How painful is it?   (Scale 0-10; mild, moderate or severe)     denies 6. FEVER: Do you have a fever? If Yes, ask: What is it, how was it measured, and when did it start?      denies 7. CAUSE: What do you think is causing the face swelling?     unknown 8. NEW MEDICINES: Have there been any new medicines started recently?     denies 9. RECURRENT SYMPTOM: Have you had face swelling before? If Yes, ask: When was the last time? What happened that time?     denies 10. OTHER SYMPTOMS: Do you have any other symptoms? (e.g., leg swelling, toothache)       Bilateral feet swelling.  Patient states that it feels like his whole body is swollen  Answer Assessment - Initial Assessment Questions Patient has been on prednisone  since d/c from hospital 07/2023. Patient would like to know if he needs to continue the prednisone   1. DESCRIPTION: Describe the itching you are having. Where is it located?     Arms are itch. Purple spots on arms that patients sister is attributing to prednisone  based on neurologist opinion 2. SEVERITY: How bad is it?      moderate 3. SCRATCHING: Are there any scratch marks? Bleeding?     denies 4. ONSET: When did the itching begin? (e.g., minutes, hours, days ago)     About 6 weeks ago 5. CAUSE: What do you think is causing the itching?      Has been told by neurologist that the spots are due to prednisone  6. OTHER SYMPTOMS: Do you have any other symptoms? (e.g., fever, rash)     none  Protocols used: Face Swelling-A-AH, Itching - Localized-A-AH

## 2024-03-12 ENCOUNTER — Encounter: Admitting: Gastroenterology

## 2024-03-14 ENCOUNTER — Ambulatory Visit
Admission: RE | Admit: 2024-03-14 | Discharge: 2024-03-14 | Disposition: A | Discharge status: Home / Self Care | Source: Ambulatory Visit | Attending: Neurology | Admitting: Neurology

## 2024-03-14 ENCOUNTER — Other Ambulatory Visit

## 2024-03-14 MED ORDER — IOPAMIDOL (ISOVUE-370) INJECTION 76%
75.0000 mL | Freq: Once | INTRAVENOUS | Status: AC | PRN
  Administered 2024-03-14: 75 mL via INTRAVENOUS

## 2024-03-19 ENCOUNTER — Telehealth: Payer: Self-pay | Admitting: Family Medicine

## 2024-03-19 ENCOUNTER — Ambulatory Visit: Admitting: Neurology

## 2024-03-19 ENCOUNTER — Other Ambulatory Visit: Payer: Self-pay | Admitting: Family Medicine

## 2024-03-19 DIAGNOSIS — R404 Transient alteration of awareness: Secondary | ICD-10-CM

## 2024-03-19 DIAGNOSIS — K219 Gastro-esophageal reflux disease without esophagitis: Secondary | ICD-10-CM

## 2024-03-19 DIAGNOSIS — R4182 Altered mental status, unspecified: Secondary | ICD-10-CM

## 2024-03-19 NOTE — Telephone Encounter (Unsigned)
 Copied from CRM [id number]. Topic: Clinical - Medication Question >> Mar 19, 2024  3:06 PM Delon DASEN wrote: Reason for CRM: Patient and sister calling to find out if patient still needs predniSONE  (DELTASONE ) 20 MG tablet, please call to advise [phone]

## 2024-03-19 NOTE — Telephone Encounter (Unsigned)
 Copied from CRM [id number]. Topic: Clinical - Medication Refill >> Mar 19, 2024  3:02 PM Delon DASEN wrote: Does not see original prescriber Medication: predniSONE  (DELTASONE ) 20 MG tablet   Has the patient contacted their pharmacy? Yes (Agent: If no, request that the patient contact the pharmacy for the refill. If patient does not wish to contact the pharmacy document the reason why and proceed with request.) (Agent: If yes, when and what did the pharmacy advise?)  This is the patient's preferred pharmacy:  [facility] [address] ([address]), [place] - [address] [place]) [place] [address] Phone: [phone] Fax: [phone]    Is this the correct pharmacy for this prescription? Yes If no, delete pharmacy and type the correct one.   Has the prescription been filled recently? Yes  Is the patient out of the medication? No  Has the patient been seen for an appointment in the last year OR does the patient have an upcoming appointment? Yes  Can we respond through MyChart? No  Agent: Please be advised that Rx refills may take up to 3 business days. We ask that you follow-up with your pharmacy.

## 2024-03-20 NOTE — Telephone Encounter (Signed)
 Complete

## 2024-03-21 NOTE — Telephone Encounter (Signed)
 Tried to call sister at number provided to obtain hospice notes. No answer, LVM to call back

## 2024-03-24 NOTE — Telephone Encounter (Signed)
 Copied from CRM #[id number]. Topic: General - Other >> Mar 24, 2024  1:36 PM Sophia H wrote: Reason for CRM: Sister Winton returning missed call regarding hospice notes to [facility] - please reach out # [phone]

## 2024-03-24 NOTE — Telephone Encounter (Signed)
 Tried returning peggy's call. LVM. Will try to call back at end of day

## 2024-03-26 ENCOUNTER — Other Ambulatory Visit: Payer: Self-pay | Admitting: Family Medicine

## 2024-03-26 MED ORDER — PREDNISONE 20 MG PO TABS: 40.0000 mg | ORAL_TABLET | Freq: Every day | ORAL | 0 refills | Status: AC

## 2024-03-26 NOTE — Telephone Encounter (Signed)
 Called pt/ sister to advise of plan. Pt's sister reports blood under skin on arms of patient and swollen feet. Pt's neurologist told them that the prednisone  is causing this.  Dr. Rosemarie from [facility] is pt's neurologist.

## 2024-03-26 NOTE — Telephone Encounter (Signed)
 Attempted to reach patient / sister to advise. No answer, LVM to call back

## 2024-03-31 NOTE — Progress Notes (Signed)
 Kindly inform the patient that CT angiogram study of the blood vessels of the brain and neck appears mostly unchanged from previous study from 2023 showing blockage of the right vertebral artery in the neck and mild changes of blockages of the blood vessels in the neck and the brain.  There are also changes of increased scarring in the lung on the right side in the upper lobe.  Kindly consult   primary care physician for further evaluation for this

## 2024-03-31 NOTE — Progress Notes (Signed)
 Your EEG of brainwave study was normal.  No seizure activity noted.

## 2024-04-01 NOTE — Telephone Encounter (Signed)
 Called and spoke to pt's sister, Winton. Advised Peggy to have pt stop taking the prednisone  until further evaluation and reminded her of pt's appt with Dr. Tanda 05/14/23. Peggy expressed understanding.

## 2024-04-02 ENCOUNTER — Ambulatory Visit: Admitting: Neurology

## 2024-04-02 NOTE — Telephone Encounter (Addendum)
 LVM Letting pt know that his EEG Results were normal per Dr. Rosemarie.   ----- Message from Nurse Heather C sent at 04/01/2024  9:48 AM EST -----  ----- Message ----- From: Rosemarie Eather RAMAN, MD Sent: 03/31/2024   5:35 PM EST To: Gna-Pod 3 Results  Your EEG of brainwave study was normal.  No seizure activity noted. ----- Message ----- From: Rosemarie Eather RAMAN, MD Sent: 03/20/2024   8:43 AM EST To: Eather RAMAN Rosemarie, MD

## 2024-04-06 ENCOUNTER — Inpatient Hospital Stay: Admission: RE | Admit: 2024-04-06 | Source: Ambulatory Visit

## 2024-05-02 ENCOUNTER — Ambulatory Visit
Admission: RE | Admit: 2024-05-02 | Discharge: 2024-05-02 | Disposition: A | Discharge status: Home / Self Care | Source: Ambulatory Visit | Attending: Neurology | Admitting: Neurology

## 2024-05-02 DIAGNOSIS — R55 Syncope and collapse: Secondary | ICD-10-CM | POA: Diagnosis not present

## 2024-05-02 MED ORDER — GADOPICLENOL 0.5 MMOL/ML IV SOLN
8.0000 mL | Freq: Once | INTRAVENOUS | Status: AC | PRN
  Administered 2024-05-02: 8 mL via INTRAVENOUS

## 2024-05-13 ENCOUNTER — Ambulatory Visit: Admitting: Family Medicine

## 2024-05-13 ENCOUNTER — Encounter: Payer: Self-pay | Admitting: Family Medicine

## 2024-05-13 VITALS — BP 136/74 | HR 94 | Ht 69.0 in | Wt 161.8 lb

## 2024-05-13 DIAGNOSIS — Z8673 Personal history of transient ischemic attack (TIA), and cerebral infarction without residual deficits: Secondary | ICD-10-CM | POA: Diagnosis not present

## 2024-05-13 DIAGNOSIS — I10 Essential (primary) hypertension: Secondary | ICD-10-CM | POA: Diagnosis not present

## 2024-05-13 DIAGNOSIS — Z23 Encounter for immunization: Secondary | ICD-10-CM

## 2024-05-13 DIAGNOSIS — R55 Syncope and collapse: Secondary | ICD-10-CM

## 2024-05-13 DIAGNOSIS — F418 Other specified anxiety disorders: Secondary | ICD-10-CM | POA: Diagnosis not present

## 2024-05-13 DIAGNOSIS — E785 Hyperlipidemia, unspecified: Secondary | ICD-10-CM

## 2024-05-13 DIAGNOSIS — F32A Depression, unspecified: Secondary | ICD-10-CM

## 2024-05-13 MED ORDER — ESCITALOPRAM OXALATE 10 MG PO TABS: 10.0000 mg | ORAL_TABLET | Freq: Every day | ORAL | 1 refills | Status: AC

## 2024-05-13 MED ORDER — ATORVASTATIN CALCIUM 20 MG PO TABS: 20.0000 mg | ORAL_TABLET | Freq: Every day | ORAL | 1 refills | Status: AC

## 2024-05-13 MED ORDER — LISINOPRIL 20 MG PO TABS: 20.0000 mg | ORAL_TABLET | Freq: Every day | ORAL | 1 refills | Status: AC

## 2024-05-13 NOTE — Progress Notes (Unsigned)
 "  Established Patient Office Visit  Subjective    Patient ID: Christopher [name], male    DOB: 21-Jan-1966  Age: 59 y.o. MRN: [id number]  CC:  Chief Complaint  Patient presents with   Medical Management of Chronic Issues    HPI Christopher [name] presents for follow up of hypertension. Patient also has been having testing for syncope per consultant. Patient does have shaking tremors and possible seizure activity intermittently.   Outpatient Encounter Medications as of 05/13/2024  Medication Sig   acetaminophen  (TYLENOL ) 325 MG tablet Take 1-2 tablets (325-650 mg total) by mouth every 4 (four) hours as needed for mild pain.   aspirin  EC 81 MG tablet Take 1 tablet (81 mg total) by mouth daily. Swallow whole.   LORazepam  (ATIVAN ) 0.5 MG tablet Take 1 tablet (0.5 mg total) by mouth See admin instructions. Take 1 tablet 30 mins prior to mri and may repeat x 1. Do not drive after taking this   omeprazole  (PRILOSEC) 20 MG capsule Take 1 capsule by mouth once daily   [DISCONTINUED] atorvastatin  (LIPITOR) 20 MG tablet Take 1 tablet (20 mg total) by mouth daily.   [DISCONTINUED] lisinopril  (ZESTRIL ) 20 MG tablet Take 1 tablet by mouth once daily   amLODipine  (NORVASC ) 10 MG tablet Take 10 mg by mouth daily. (Patient not taking: Reported on 05/13/2024)   atorvastatin  (LIPITOR) 20 MG tablet Take 1 tablet (20 mg total) by mouth daily.   budesonide  (PULMICORT ) 0.25 MG/2ML nebulizer solution Take 2 mLs (0.25 mg total) by nebulization 2 (two) times daily. (Patient not taking: Reported on 03/05/2024)   escitalopram  (LEXAPRO ) 10 MG tablet Take 1 tablet (10 mg total) by mouth daily.   guaiFENesin  (MUCINEX ) 600 MG 12 hr tablet Take 2 tablets (1,200 mg total) by mouth 2 (two) times daily. (Patient not taking: Reported on 03/05/2024)   hydrocortisone  (ANUSOL -HC) 25 MG suppository Place 1 suppository (25 mg total) rectally at bedtime. (Patient not taking: Reported on 03/05/2024)   ipratropium-albuterol  (DUONEB) 0.5-2.5 (3)  MG/3ML SOLN Take 3 mLs by nebulization 2 (two) times daily. (Patient not taking: Reported on 03/05/2024)   lisinopril  (ZESTRIL ) 20 MG tablet Take 1 tablet (20 mg total) by mouth daily.   predniSONE  (DELTASONE ) 20 MG tablet Take 2 tablets (40 mg total) by mouth daily before breakfast. (Patient not taking: Reported on 05/13/2024)   [DISCONTINUED] escitalopram  (LEXAPRO ) 10 MG tablet Take 1 tablet (10 mg total) by mouth daily.   No facility-administered encounter medications on file as of 05/13/2024.    Past Medical History:  Diagnosis Date   GERD (gastroesophageal reflux disease)    Hyperlipidemia    Hypertension    PUD (peptic ulcer disease) 2008   treated with meds   Stroke (HCC) 01/2021   Stroke (HCC) 09/2021    Past Surgical History:  Procedure Laterality Date   CLAVICLE SURGERY Right    fracture repair   INGUINAL HERNIA REPAIR Right    age 56   rupture ulcer      Family History  Problem Relation Age of Onset   Cancer Mother    Stroke Father    Colon cancer Neg Hx    Stomach cancer Neg Hx    Esophageal cancer Neg Hx     Social History   Socioeconomic History   Marital status: Divorced    Spouse name: Not on file   Number of children: 0   Years of education: Not on file   Highest education level: Not on file  Occupational  History   Occupation: disabled  Tobacco Use   Smoking status: Every Day    Current packs/day: 0.50    Types: Cigarettes   Smokeless tobacco: Former  Building Services Engineer status: Never Used  Substance and Sexual Activity   Alcohol use: Yes    Comment: 2-3 sparks a day   Drug use: Not Currently    Types: Crack cocaine    Comment: only every once in awhile   Sexual activity: Not Currently  Other Topics Concern   Not on file  Social History Narrative   Not on file   Social Drivers of Health   Tobacco Use: High Risk (05/13/2024)   Patient History    Smoking Tobacco Use: Every Day    Smokeless Tobacco Use: Former    Passive Exposure:  Not on Actuary Strain: Medium Risk (08/16/2022)   Overall Financial Resource Strain (CARDIA)    Difficulty of Paying Living Expenses: Somewhat hard  Food Insecurity: No Food Insecurity (07/13/2023)   Hunger Vital Sign    Worried About Running Out of Food in the Last Year: Never true    Ran Out of Food in the Last Year: Never true  Transportation Needs: No Transportation Needs (07/13/2023)   PRAPARE - Administrator, Civil Service (Medical): No    Lack of Transportation (Non-Medical): No  Physical Activity: Sufficiently Active (08/16/2022)   Exercise Vital Sign    Days of Exercise per Week: 7 days    Minutes of Exercise per Session: 30 min  Stress: No Stress Concern Present (02/15/2023)   [employer] of Occupational Health - Occupational Stress Questionnaire    Feeling of Stress : Not at all  Social Connections: Socially Isolated (08/16/2022)   Social Connection and Isolation Panel    Frequency of Communication with Friends and Family: More than three times a week    Frequency of Social Gatherings with Friends and Family: More than three times a week    Attends Religious Services: Never    Database Administrator or Organizations: No    Attends Banker Meetings: Never    Marital Status: Separated  Intimate Partner Violence: Not At Risk (07/13/2023)   Humiliation, Afraid, Rape, and Kick questionnaire    Fear of Current or Ex-Partner: No    Emotionally Abused: No    Physically Abused: No    Sexually Abused: No  Depression (PHQ2-9): Low Risk (11/20/2023)   Depression (PHQ2-9)    PHQ-2 Score: 0  Alcohol Screen: Medium Risk (08/16/2022)   Alcohol Screen    Last Alcohol Screening Score (AUDIT): 13  Housing: Low Risk (07/13/2023)   Housing Stability Vital Sign    Unable to Pay for Housing in the Last Year: No    Number of Times Moved in the Last Year: 0    Homeless in the Last Year: No  Utilities: Not At Risk (07/13/2023)   AHC Utilities     Threatened with loss of utilities: No  Health Literacy: Inadequate Health Literacy (02/15/2023)   B1300 Health Literacy    Frequency of need for help with medical instructions: Sometimes    ROS      Objective    BP 136/74   Pulse 94   Ht 5' 9 (1.753 m)   Wt 161 lb 12.8 oz (73.4 kg)   SpO2 94%   BMI 23.89 kg/m   Physical Exam Vitals and nursing note reviewed.  Constitutional:      General:  He is not in acute distress. HENT:     Head: Normocephalic and atraumatic.  Cardiovascular:     Rate and Rhythm: Normal rate and regular rhythm.  Pulmonary:     Effort: Pulmonary effort is normal.     Breath sounds: Normal breath sounds.  Abdominal:     Palpations: Abdomen is soft.     Tenderness: There is no abdominal tenderness.  Neurological:     General: No focal deficit present.     Mental Status: He is alert and oriented to person, place, and time.  Psychiatric:        Mood and Affect: Mood normal.        Behavior: Behavior normal.     {Labs (Optional):23779}    Assessment & Plan:   Syncope, unspecified syncope type  History of CVA (cerebrovascular accident)  Essential hypertension  Dyslipidemia -     Atorvastatin  Calcium ; Take 1 tablet (20 mg total) by mouth daily.  Dispense: 90 tablet; Refill: 1  Anxiety and depression  Other orders -     Lisinopril ; Take 1 tablet (20 mg total) by mouth daily.  Dispense: 90 tablet; Refill: 1 -     Escitalopram  Oxalate; Take 1 tablet (10 mg total) by mouth daily.  Dispense: 90 tablet; Refill: 1     No follow-ups on file.   Tanda Raguel SQUIBB, MD  "

## 2024-05-14 ENCOUNTER — Encounter: Payer: Self-pay | Admitting: Family Medicine

## 2024-05-15 ENCOUNTER — Other Ambulatory Visit: Payer: Self-pay | Admitting: Family Medicine

## 2024-05-15 DIAGNOSIS — K219 Gastro-esophageal reflux disease without esophagitis: Secondary | ICD-10-CM

## 2024-05-15 NOTE — Telephone Encounter (Signed)
 Copied from CRM #[id number]. Topic: Clinical - Prescription Issue >> May 15, 2024 10:51 AM Avram MATSU wrote: Reason for CRM: sister stated pharmacy sent in a refill request and pt will be out of amLODipine  & omeprazole  in two days. Sister was informed of the turnaround time.

## 2024-05-16 MED ORDER — OMEPRAZOLE 20 MG PO CPDR: 20.0000 mg | DELAYED_RELEASE_CAPSULE | Freq: Every day | ORAL | 1 refills | Status: AC

## 2024-05-22 ENCOUNTER — Ambulatory Visit: Admitting: Gastroenterology

## 2024-05-22 ENCOUNTER — Other Ambulatory Visit

## 2024-05-22 ENCOUNTER — Encounter: Payer: Self-pay | Admitting: Gastroenterology

## 2024-05-22 VITALS — BP 108/50 | HR 85 | Ht 69.0 in | Wt 160.5 lb

## 2024-05-22 DIAGNOSIS — K640 First degree hemorrhoids: Secondary | ICD-10-CM | POA: Diagnosis not present

## 2024-05-22 DIAGNOSIS — R748 Abnormal levels of other serum enzymes: Secondary | ICD-10-CM

## 2024-05-22 LAB — COMPREHENSIVE METABOLIC PANEL WITH GFR
ALT: 9 U/L (ref 3–53)
AST: 14 U/L (ref 5–37)
Albumin: 4.1 g/dL (ref 3.5–5.2)
Alkaline Phosphatase: 90 U/L (ref 39–117)
BUN: 12 mg/dL (ref 6–23)
CO2: 24 meq/L (ref 19–32)
Calcium: 9.6 mg/dL (ref 8.4–10.5)
Chloride: 103 meq/L (ref 96–112)
Creatinine, Ser: 1.34 mg/dL (ref 0.40–1.50)
GFR: 57.85 mL/min — ABNORMAL LOW
Glucose, Bld: 98 mg/dL (ref 70–99)
Potassium: 3.7 meq/L (ref 3.5–5.1)
Sodium: 141 meq/L (ref 135–145)
Total Bilirubin: 0.2 mg/dL (ref 0.2–1.2)
Total Protein: 7.2 g/dL (ref 6.0–8.3)

## 2024-05-22 NOTE — Progress Notes (Signed)
 "  HPI : Christopher [name] is a 59 y.o. male with a history of stroke, peptic ulcer disease and chronic tobacco use who has undergone serial hemorrhoid banding with me, who was scheduled for another banding session today.  However, the patient states that his hemorrhoid symptoms have completely resolved since his last banding.  He does not wish to pursue any further banding today. He reports regular bowel habits with formed stools daily.  Denies problems with constipation, hard stools, straining or excessive bowel movements/loose stools.  He has not seen blood in his stool for quite some time.  He not taking any fiber supplements or any other laxatives.  He has no other concerns or complaints today.  Review of his medical history indicates elevated liver enzymes during a hospitalization in March of last year.  He has not had his liver enzymes checked since then. Patient states that he did use to drink fairly heavily (several drinks per day), but now drinks on rare occasion.   Past Medical History:  Diagnosis Date   GERD (gastroesophageal reflux disease)    Hyperlipidemia    Hypertension    PUD (peptic ulcer disease) 2008   treated with meds   Stroke (HCC) 01/2021   Stroke (HCC) 09/2021     Past Surgical History:  Procedure Laterality Date   CLAVICLE SURGERY Right    fracture repair   INGUINAL HERNIA REPAIR Right    age 72   rupture ulcer     Family History  Problem Relation Age of Onset   Cancer Mother    Stroke Father    Colon cancer Neg Hx    Stomach cancer Neg Hx    Esophageal cancer Neg Hx    Social History[1] Current Outpatient Medications  Medication Sig Dispense Refill   acetaminophen  (TYLENOL ) 325 MG tablet Take 1-2 tablets (325-650 mg total) by mouth every 4 (four) hours as needed for mild pain.     amLODipine  (NORVASC ) 10 MG tablet Take 1 tablet by mouth once daily 90 tablet 1   aspirin  EC 81 MG tablet Take 1 tablet (81 mg total) by mouth daily. Swallow whole. 30  tablet 12   atorvastatin  (LIPITOR) 20 MG tablet Take 1 tablet (20 mg total) by mouth daily. 90 tablet 1   budesonide  (PULMICORT ) 0.25 MG/2ML nebulizer solution Take 2 mLs (0.25 mg total) by nebulization 2 (two) times daily. 60 mL 12   escitalopram  (LEXAPRO ) 10 MG tablet Take 1 tablet (10 mg total) by mouth daily. 90 tablet 1   guaiFENesin  (MUCINEX ) 600 MG 12 hr tablet Take 2 tablets (1,200 mg total) by mouth 2 (two) times daily. 20 tablet 0   hydrocortisone  (ANUSOL -HC) 25 MG suppository Place 1 suppository (25 mg total) rectally at bedtime. 5 suppository 0   ipratropium-albuterol  (DUONEB) 0.5-2.5 (3) MG/3ML SOLN Take 3 mLs by nebulization 2 (two) times daily. 360 mL 0   lisinopril  (ZESTRIL ) 20 MG tablet Take 1 tablet (20 mg total) by mouth daily. 90 tablet 1   LORazepam  (ATIVAN ) 0.5 MG tablet Take 1 tablet (0.5 mg total) by mouth See admin instructions. Take 1 tablet 30 mins prior to mri and may repeat x 1. Do not drive after taking this 2 tablet 0   omeprazole  (PRILOSEC) 20 MG capsule Take 1 capsule (20 mg total) by mouth daily. 90 capsule 1   predniSONE  (DELTASONE ) 20 MG tablet Take 2 tablets (40 mg total) by mouth daily before breakfast. 60 tablet 0   No current facility-administered medications  for this visit.   Allergies[2]   Review of Systems: All systems reviewed and negative except where noted in HPI.    MR BRAIN W WO CONTRAST Result Date: 05/05/2024  [facility] [address] [place], [place] [address] [phone] NEUROIMAGING REPORT STUDY DATE: 05/02/2024 PATIENT NAME: Christopher [name] DOB: 06-25-1965 MRN: [id number] ORDERING CLINICIAN: Dr. Rosemarie CLINICAL HISTORY: 59 year old patient being evaluated for syncopal episode COMPARISON FILMS: MRI scan of the brain without contrast 09/11/2021 EXAM: MRI brain with and without contrast TECHNIQUE: Sagittal T1, axial T1, T2, FLAIR, DWI, ADC map, coronal T2, axial SWI and postcontrast axial and coronal T1 images were  obtained through the brain CONTRAST: 8 mL IV gadopiclenol  IMAGING SITE: [facility] imaging FINDINGS: The brain parenchyma shows remote age infarct with gliosis in the right parieto-occipital, left parietal lobe perirolandic region as well as small bilateral basal ganglia and left thalamic lacunar infarcts.  There is age advanced changes of chronic small vessel disease and mild degree of generalized cerebral atrophy.  There are changes of wallerian degeneration noted in the right cerebral peduncle.  Diffusion weighted imaging negative for acute ischemia.  SWI sequences do not show any microhemorrhages.  The subarachnoid spaces and ventricular system appear normal.  Cortical sulci and gyri show normal appearance.  Calvarium shows no abnormalities.  Orbits appear unremarkable.  The paranasal sinuses show mild chronic inflammatory changes.  Flow-voids of large vessel intracranial circulation appear to be patent.  Postcontrast images do not result in abnormal areas of enhancement.  The pituitary gland and cerebellar tonsils appear normal.  Visualized portion of the cervical spine shows mild disc degenerative changes.   MRI scan of the brain with and without contrast showing remote age infarcts with gliosis in the right parieto-occipital region and left perirolandic region as well as remote age small bilateral basal ganglia and left thalamic lacunar infarcts.  There is age advanced changes of chronic small vessel disease and mild degree of generalized cerebral atrophy which appears slightly progressed compared with previous MRI from 09/11/2021.  There are no acute changes or any abnormal enhancement INTERPRETING PHYSICIAN: PRAMOD SETHI, MD Certified in  Neuroimaging by American Society of Neuroimaging and [employer] Neurological Subspecialities   Physical Exam: BP (!) 108/50   Pulse 85   Ht 5' 9 (1.753 m)   Wt 160 lb 8 oz (72.8 kg)   BMI 23.70 kg/m  Constitutional: Pleasant,well-developed, Caucasian  male in no acute distress.  Dysarthria HEENT: Normocephalic and atraumatic. Conjunctivae are normal. No scleral icterus.  Poor dentition Neck supple.  Cardiovascular: Normal rate, regular rhythm.  Pulmonary/chest: Effort normal and breath sounds normal. No wheezing, rales or rhonchi. Abdominal: Soft, nondistended, nontender. Bowel sounds active throughout. There are no masses palpable. No hepatomegaly. Extremities: no edema Neurological: Alert and oriented to person place and time.  Dysarthria Skin: Skin is warm and dry. No rashes noted. Psychiatric: Normal mood and affect. Behavior is normal.  CBC    Component Value Date/Time   WBC 13.4 (H) 10/11/2023 1202   RBC 4.02 (L) 10/11/2023 1202   HGB 11.7 (L) 10/11/2023 1202   HGB 13.1 03/19/2023 0000   HCT 36.1 (L) 10/11/2023 1202   HCT 41.0 03/19/2023 0000   PLT 283.0 10/11/2023 1202   PLT 326 03/19/2023 0000   MCV 90.0 10/11/2023 1202   MCV 88 03/19/2023 0000   MCH 28.1 07/24/2023 0455   MCHC 32.4 10/11/2023 1202   RDW 14.9 10/11/2023 1202   RDW 11.8 03/19/2023 0000  LYMPHSABS 1.3 07/24/2023 0455   LYMPHSABS 2.0 03/19/2023 0000   MONOABS 0.5 07/24/2023 0455   EOSABS 0.2 07/24/2023 0455   EOSABS 0.2 03/19/2023 0000   BASOSABS 0.0 07/24/2023 0455   BASOSABS 0.1 03/19/2023 0000    CMP     Component Value Date/Time   NA 134 (L) 07/24/2023 0455   NA 140 03/19/2023 0000   K 3.7 07/24/2023 0455   CL 102 07/24/2023 0455   CO2 23 07/24/2023 0455   GLUCOSE 98 07/24/2023 0455   BUN 23 (H) 07/24/2023 0455   BUN 13 03/19/2023 0000   CREATININE 1.16 07/24/2023 0455   CALCIUM  8.5 (L) 07/24/2023 0455   PROT 6.8 07/22/2023 0831   PROT 7.2 08/10/2021 1538   ALBUMIN 2.3 (L) 07/22/2023 0831   ALBUMIN 4.9 08/10/2021 1538   AST 61 (H) 07/22/2023 0831   ALT 160 (H) 07/22/2023 0831   ALKPHOS 181 (H) 07/22/2023 0831   BILITOT 0.6 07/22/2023 0831   BILITOT 0.2 08/10/2021 1538   GFRNONAA >60 07/24/2023 0455   GFRAA >90 11/15/2012 1044        Latest Ref Rng & Units 10/11/2023   12:02 PM 07/24/2023    4:55 AM 07/23/2023    4:49 AM  CBC EXTENDED  WBC 4.0 - 10.5 K/uL 13.4  14.7  15.0   RBC 4.22 - 5.81 Mil/uL 4.02  4.02  4.11   Hemoglobin 13.0 - 17.0 g/dL 88.2  88.6  88.4   HCT 39.0 - 52.0 % 36.1  34.3  35.1   Platelets 150.0 - 400.0 K/uL 283.0  599  539   NEUT# 1.7 - 7.7 K/uL  12.3  12.9   Lymph# 0.7 - 4.0 K/uL  1.3  1.1       ASSESSMENT AND PLAN:  59 year old male with history of stroke and chronic tobacco use who has undergone serial banding of internal hemorrhoids now with complete resolution of his hemorrhoid symptoms.  No indication for further banding given resolution of symptoms. No other GI complaints.  Will plan to repeat liver enzymes given that his last hepatic panel in March 2025 was abnormal.  Grade 1 internal hemorrhoids - Status post banding x 3 with complete resolution of symptoms - No further banding recommended - Continue to avoid straining, hard stools, excessive toilet time - Contact us  if hemorrhoid symptoms return and he wishes to consider further banding  Elevated liver enzymes In the setting of severe influenza A/acute hospitalization.  Most likely secondary to medications versus acute illness - Repeat liver enzymes today - If persistently elevated, will obtain ultrasound and evaluation for chronic liver disease  I spent a total of 20 minutes reviewing the patient's medical record, interviewing and examining the patient, discussing his diagnosis and management of his condition going forward, and documenting in the medical record  Eliyana Pagliaro E. Stacia, MD [facility]   Tanda Bleacher, MD     [1]  Social History Tobacco Use   Smoking status: Every Day    Current packs/day: 0.50    Types: Cigarettes   Smokeless tobacco: Former  Building Services Engineer status: Never Used  Substance Use Topics   Alcohol use: Yes    Comment: 2-3 sparks a day   Drug use: Not Currently     Types: Crack cocaine    Comment: only every once in awhile  [2] No Known Allergies  "

## 2024-05-22 NOTE — Patient Instructions (Signed)
 Your provider has requested that you go to the basement level for lab work before leaving today. Press B on the elevator. The lab is located at the first door on the left as you exit the elevator.  _______________________________________________________  If your blood pressure at your visit was 140/90 or greater, please contact your primary care physician to follow up on this.  _______________________________________________________  If you are age 60 or older, your body mass index should be between 23-30. Your Body mass index is 23.7 kg/m. If this is out of the aforementioned range listed, please consider follow up with your Primary Care Provider.  If you are age 15 or younger, your body mass index should be between 19-25. Your Body mass index is 23.7 kg/m. If this is out of the aformentioned range listed, please consider follow up with your Primary Care Provider.   ________________________________________________________  The [facility] providers would like to encourage you to use MYCHART to communicate with providers for non-urgent requests or questions.  Due to long hold times on the telephone, sending your provider a message by [facility] may be a faster and more efficient way to get a response.  Please allow 48 business hours for a response.  Please remember that this is for non-urgent requests.  _______________________________________________________  [facility] is using a team-based approach to care.  Your team is made up of your doctor and two to three APPS. Our APPS (Nurse Practitioners and Physician Assistants) work with your physician to ensure care continuity for you. They are fully qualified to address your health concerns and develop a treatment plan. They communicate directly with your gastroenterologist to care for you. Seeing the Advanced Practice Practitioners on your physician's team can help you by facilitating care more promptly, often allowing for earlier  appointments, access to diagnostic testing, procedures, and other specialty referrals.

## 2024-05-26 ENCOUNTER — Ambulatory Visit: Payer: Self-pay | Admitting: Gastroenterology

## 2024-05-26 NOTE — Progress Notes (Signed)
 Team,  Please let Christopher [name] know that his liver enzymes are back to normal.  Suspect elevated liver enzymes were related to his infection last year.  No further testing necessary.

## 2024-05-29 ENCOUNTER — Telehealth: Payer: Self-pay | Admitting: Neurology

## 2024-05-29 NOTE — Telephone Encounter (Signed)
 Pt's sister reports they are waiting on results from MRI done in December '25

## 2024-06-03 NOTE — Telephone Encounter (Signed)
 Called pt and let him know his Lab Results per Dr. Rosemarie:  Pt's sister stated that she wanted to know if Dr. Rosemarie will write pt a letter for his disability, pt has been trying to get disability since his stroke.  Pt's sister also states that pt still his having weakness in his arm since his stroke and it hasn't gotten any better.

## 2024-08-06 ENCOUNTER — Ambulatory Visit: Admitting: Neurology

## 2024-11-10 ENCOUNTER — Ambulatory Visit: Payer: Self-pay | Admitting: Family Medicine
